# Patient Record
Sex: Male | Born: 1935
Health system: Southern US, Community
[De-identification: ages and names within clinical notes are randomized; demographics above are authoritative.]

## PROBLEM LIST (undated history)

## (undated) DIAGNOSIS — J189 Pneumonia, unspecified organism: Secondary | ICD-10-CM

## (undated) DIAGNOSIS — I2510 Atherosclerotic heart disease of native coronary artery without angina pectoris: Secondary | ICD-10-CM

## (undated) DIAGNOSIS — K5900 Constipation, unspecified: Secondary | ICD-10-CM

## (undated) DIAGNOSIS — I219 Acute myocardial infarction, unspecified: Secondary | ICD-10-CM

## (undated) DIAGNOSIS — N186 End stage renal disease: Secondary | ICD-10-CM

## (undated) DIAGNOSIS — I10 Essential (primary) hypertension: Secondary | ICD-10-CM

## (undated) DIAGNOSIS — I509 Heart failure, unspecified: Secondary | ICD-10-CM

## (undated) DIAGNOSIS — I209 Angina pectoris, unspecified: Secondary | ICD-10-CM

## (undated) DIAGNOSIS — M1990 Unspecified osteoarthritis, unspecified site: Secondary | ICD-10-CM

## (undated) HISTORY — DX: Heart failure, unspecified: I50.9

## (undated) HISTORY — PX: BACK SURGERY: SHX140

## (undated) HISTORY — PX: ARTERIOVENOUS GRAFT PLACEMENT: SUR1029

## (undated) HISTORY — DX: Acute myocardial infarction, unspecified: I21.9

## (undated) HISTORY — PX: LAPAROSCOPIC COLON RESECTION: SUR791

## (undated) HISTORY — PX: CORONARY ARTERY BYPASS GRAFT: SHX141

---

## 1999-06-02 ENCOUNTER — Encounter: Payer: Self-pay | Admitting: Thoracic Surgery (Cardiothoracic Vascular Surgery)

## 1999-06-06 ENCOUNTER — Inpatient Hospital Stay (HOSPITAL_COMMUNITY)
Admission: RE | Admit: 1999-06-06 | Discharge: 1999-06-14 | Payer: Self-pay | Admitting: Thoracic Surgery (Cardiothoracic Vascular Surgery)

## 1999-06-06 ENCOUNTER — Encounter: Payer: Self-pay | Admitting: Thoracic Surgery (Cardiothoracic Vascular Surgery)

## 1999-06-07 ENCOUNTER — Encounter: Payer: Self-pay | Admitting: Thoracic Surgery (Cardiothoracic Vascular Surgery)

## 1999-06-08 ENCOUNTER — Encounter: Payer: Self-pay | Admitting: Thoracic Surgery (Cardiothoracic Vascular Surgery)

## 1999-06-12 ENCOUNTER — Encounter: Payer: Self-pay | Admitting: Thoracic Surgery (Cardiothoracic Vascular Surgery)

## 2002-02-20 ENCOUNTER — Encounter: Payer: Self-pay | Admitting: Nephrology

## 2002-02-20 ENCOUNTER — Ambulatory Visit (HOSPITAL_COMMUNITY): Admission: RE | Admit: 2002-02-20 | Discharge: 2002-02-20 | Payer: Self-pay | Admitting: Nephrology

## 2007-08-15 ENCOUNTER — Ambulatory Visit: Payer: Self-pay | Admitting: Vascular Surgery

## 2007-08-21 ENCOUNTER — Ambulatory Visit (HOSPITAL_COMMUNITY): Admission: RE | Admit: 2007-08-21 | Discharge: 2007-08-21 | Payer: Self-pay | Admitting: Vascular Surgery

## 2007-08-21 ENCOUNTER — Ambulatory Visit: Payer: Self-pay | Admitting: Vascular Surgery

## 2007-11-25 ENCOUNTER — Inpatient Hospital Stay (HOSPITAL_COMMUNITY): Admission: AD | Admit: 2007-11-25 | Discharge: 2007-12-03 | Payer: Self-pay | Admitting: Internal Medicine

## 2008-09-24 ENCOUNTER — Ambulatory Visit: Payer: Self-pay | Admitting: Vascular Surgery

## 2008-09-24 ENCOUNTER — Ambulatory Visit (HOSPITAL_COMMUNITY): Admission: RE | Admit: 2008-09-24 | Discharge: 2008-09-24 | Payer: Self-pay | Admitting: Vascular Surgery

## 2008-12-04 ENCOUNTER — Ambulatory Visit: Payer: Self-pay | Admitting: Internal Medicine

## 2008-12-04 ENCOUNTER — Inpatient Hospital Stay (HOSPITAL_COMMUNITY): Admission: EM | Admit: 2008-12-04 | Discharge: 2008-12-07 | Payer: Self-pay | Admitting: Emergency Medicine

## 2010-08-27 LAB — CARDIAC PANEL(CRET KIN+CKTOT+MB+TROPI)
CK, MB: 1.5 ng/mL (ref 0.3–4.0)
Relative Index: 0.6 (ref 0.0–2.5)
Total CK: 121 U/L (ref 7–232)
Total CK: 173 U/L (ref 7–232)
Total CK: 176 U/L (ref 7–232)
Troponin I: 0.06 ng/mL (ref 0.00–0.06)

## 2010-08-27 LAB — URINE CULTURE: Colony Count: 2000

## 2010-08-27 LAB — BASIC METABOLIC PANEL
BUN: 40 mg/dL — ABNORMAL HIGH (ref 6–23)
CO2: 32 mEq/L (ref 19–32)
Calcium: 8.7 mg/dL (ref 8.4–10.5)
Chloride: 97 mEq/L (ref 96–112)
Chloride: 97 mEq/L (ref 96–112)
Creatinine, Ser: 6.54 mg/dL — ABNORMAL HIGH (ref 0.4–1.5)
GFR calc Af Amer: 11 mL/min — ABNORMAL LOW (ref >60)
GFR calc non Af Amer: 8 mL/min — ABNORMAL LOW (ref >60)
Sodium: 137 mEq/L (ref 135–145)

## 2010-08-27 LAB — GLUCOSE, CAPILLARY
Glucose-Capillary: 104 mg/dL — ABNORMAL HIGH (ref 70–99)
Glucose-Capillary: 114 mg/dL — ABNORMAL HIGH (ref 70–99)
Glucose-Capillary: 129 mg/dL — ABNORMAL HIGH (ref 70–99)
Glucose-Capillary: 139 mg/dL — ABNORMAL HIGH (ref 70–99)
Glucose-Capillary: 150 mg/dL — ABNORMAL HIGH (ref 70–99)
Glucose-Capillary: 162 mg/dL — ABNORMAL HIGH (ref 70–99)
Glucose-Capillary: 174 mg/dL — ABNORMAL HIGH (ref 70–99)
Glucose-Capillary: 88 mg/dL (ref 70–99)

## 2010-08-27 LAB — CBC
HCT: 34.2 % — ABNORMAL LOW (ref 39.0–52.0)
HCT: 35.9 % — ABNORMAL LOW (ref 39.0–52.0)
Hemoglobin: 10.8 g/dL — ABNORMAL LOW (ref 13.0–17.0)
Hemoglobin: 11.6 g/dL — ABNORMAL LOW (ref 13.0–17.0)
MCHC: 33.3 g/dL (ref 30.0–36.0)
MCHC: 34.1 g/dL (ref 30.0–36.0)
MCV: 98.1 fL (ref 78.0–100.0)
MCV: 99 fL (ref 78.0–100.0)
MCV: 99.4 fL (ref 78.0–100.0)
Platelets: 123 10*3/uL — ABNORMAL LOW (ref 150–400)
RBC: 3.18 MIL/uL — ABNORMAL LOW (ref 4.22–5.81)
RBC: 3.45 MIL/uL — ABNORMAL LOW (ref 4.22–5.81)
RBC: 3.63 MIL/uL — ABNORMAL LOW (ref 4.22–5.81)
RDW: 14.6 % (ref 11.5–15.5)
WBC: 12.2 10*3/uL — ABNORMAL HIGH (ref 4.0–10.5)
WBC: 13 10*3/uL — ABNORMAL HIGH (ref 4.0–10.5)
WBC: 9.1 10*3/uL (ref 4.0–10.5)

## 2010-08-27 LAB — ALBUMIN: Albumin: 2.5 g/dL — ABNORMAL LOW (ref 3.5–5.2)

## 2010-08-27 LAB — HEPATIC FUNCTION PANEL
Albumin: 3.1 g/dL — ABNORMAL LOW (ref 3.5–5.2)
Alkaline Phosphatase: 50 U/L (ref 39–117)
Bilirubin, Direct: <0.1 mg/dL (ref 0.0–0.3)
Total Bilirubin: 0.9 mg/dL (ref 0.3–1.2)

## 2010-08-27 LAB — URINALYSIS, ROUTINE W REFLEX MICROSCOPIC
Ketones, ur: 15 mg/dL — AB
Nitrite: NEGATIVE
Protein, ur: >300 mg/dL — AB
pH: 8.5 — ABNORMAL HIGH (ref 5.0–8.0)

## 2010-08-27 LAB — COMPREHENSIVE METABOLIC PANEL
AST: 45 U/L — ABNORMAL HIGH (ref 0–37)
BUN: 11 mg/dL (ref 6–23)
CO2: 31 mEq/L (ref 19–32)
Calcium: 8.9 mg/dL (ref 8.4–10.5)
Chloride: 95 mEq/L — ABNORMAL LOW (ref 96–112)
Creatinine, Ser: 3.73 mg/dL — ABNORMAL HIGH (ref 0.4–1.5)
GFR calc Af Amer: 20 mL/min — ABNORMAL LOW (ref >60)
GFR calc non Af Amer: 16 mL/min — ABNORMAL LOW (ref >60)
Glucose, Bld: 123 mg/dL — ABNORMAL HIGH (ref 70–99)
Total Bilirubin: 1.1 mg/dL (ref 0.3–1.2)

## 2010-08-27 LAB — LIPID PANEL: Cholesterol: 142 mg/dL (ref 0–200)

## 2010-08-27 LAB — RENAL FUNCTION PANEL
BUN: 17 mg/dL (ref 6–23)
CO2: 32 mEq/L (ref 19–32)
Chloride: 97 mEq/L (ref 96–112)
Glucose, Bld: 107 mg/dL — ABNORMAL HIGH (ref 70–99)
Phosphorus: 2.3 mg/dL (ref 2.3–4.6)
Potassium: 4.3 mEq/L (ref 3.5–5.1)
Sodium: 138 mEq/L (ref 135–145)

## 2010-08-27 LAB — TROPONIN I: Troponin I: 0.06 ng/mL (ref 0.00–0.06)

## 2010-08-27 LAB — CULTURE, BLOOD (SINGLE)

## 2010-08-27 LAB — PROTIME-INR
INR: 1.3 (ref 0.00–1.49)
Prothrombin Time: 16.3 seconds — ABNORMAL HIGH (ref 11.6–15.2)

## 2010-08-27 LAB — CULTURE, BLOOD (ROUTINE X 2)
Culture: NO GROWTH
Culture: NO GROWTH

## 2010-08-27 LAB — DIFFERENTIAL
Basophils Absolute: 0 10*3/uL (ref 0.0–0.1)
Eosinophils Relative: 0 % (ref 0–5)
Lymphocytes Relative: 7 % — ABNORMAL LOW (ref 12–46)
Lymphs Abs: 0.8 10*3/uL (ref 0.7–4.0)
Neutro Abs: 10.4 10*3/uL — ABNORMAL HIGH (ref 1.7–7.7)
Neutrophils Relative %: 85 % — ABNORMAL HIGH (ref 43–77)

## 2010-08-27 LAB — HEMOGLOBIN A1C

## 2010-08-27 LAB — RPR: RPR Ser Ql: NONREACTIVE

## 2010-08-27 LAB — CK TOTAL AND CKMB (NOT AT ARMC)
CK, MB: 1.1 ng/mL (ref 0.3–4.0)
Total CK: 116 U/L (ref 7–232)

## 2010-08-27 LAB — URINE MICROSCOPIC-ADD ON

## 2010-08-27 LAB — MRSA PCR SCREENING: MRSA by PCR: NEGATIVE

## 2010-08-27 LAB — MAGNESIUM: Magnesium: 2.1 mg/dL (ref 1.5–2.5)

## 2010-08-27 LAB — PHOSPHORUS: Phosphorus: 2.8 mg/dL (ref 2.3–4.6)

## 2010-08-29 LAB — POCT I-STAT 4, (NA,K, GLUC, HGB,HCT): HCT: 37 % — ABNORMAL LOW (ref 39.0–52.0)

## 2010-10-03 NOTE — H&P (Signed)
NAMEKENECHUKWU, ECKSTEIN                ACCOUNT NO.:  [id number]   MEDICAL RECORD NO.:  [id number]          PATIENT TYPE:  INP   LOCATION:  6730                         FACILITY:  [facility]   PHYSICIAN:  Elliot Cousin, M.D.    DATE OF BIRTH:  Jan 25, 1936   DATE OF ADMISSION:  11/25/2007  DATE OF DISCHARGE:                              HISTORY & PHYSICAL   PRIMARY CARE PHYSICIAN:  The patient is unassigned.   PRIMARY NEPHROLOGIST:  Garnetta Buddy, M.D.   CHIEF COMPLAINT:  The patient was transferred from [facility] for  further evaluation and management of acute on chronic renal failure.   HISTORY OF PRESENT ILLNESS:  The patient is a 75 year old man with a  past medical history significant for stage 4 chronic kidney disease,  coronary artery disease status, hypertension and type 2 diabetes  mellitus.  He was recently admitted to [facility] for further  evaluation of a colon mass apparently found via colonoscopy. The patient  underwent laparoscopic segmental colon resection of a portion of the  transverse colon with stapled anastomosis by Dr. Georgiana Shore on November 24, 2007.  Over the past 24 hours, the patient's urine output has decreased.  His creatinine has been ranging from 5 to 6 over the past couple of days  per the records from [facility].  Dr. Hyman Hopes, nephrologist,  evaluated the patient at [facility] earlier today.  In light of  the patient's diminishing urine output, he was transferred as a direct  admission to [facility].  Currently, the patient complains of  10/10 abdominal pain at and around the operative site.  He denies  diarrhea, nausea, painful urination, or vomiting.  He has not had a  bowel movement since the operation.  He has passed some flatulence.  He  has no complaints of chest pain although he does have some mild  shortness of breath. He denies cough, fever, or chills.   PAST MEDICAL HISTORY:  1. As above, status post laparoscopy,  mobilization of splenic flexure,      segmental colon resection of a portion of transverse colon with      stapled anastomosis per Dr. Georgiana Shore November 24, 2007 at [facility].  2. Stage 4 chronic kidney disease with a baseline creatinine at      approximately 6.0.  3. Coronary artery disease status post CABG in 2001.  4. Hypertension.  5. Hyperlipidemia.  6. Type 2 diabetes mellitus.  7. Degenerative joint disease.  8. Placement of new left forearm AV graft, April 2009, per Dr.      Edilia Bo.  9. Snuff use.   MEDICATIONS AT [facility]:  1. Lasix 160 mg IV every 8 hours.  2. IV fluids with D5W with 3 amps of bicarb at 75 cc an hour.  3. Albuterol nebulizer every 4 hours.  4. Allopurinol 100 mg daily, discontinued July 7.  5. Calcitriol 0.25 mcg daily, discontinued July 7.  6. Coreg 25 mg b.i.d., discontinued July 7.  7. Magnesium oxide 400 mg 2 tablets daily, discontinued July 7.  8. Lovenox 40 mg subcu nightly.  9. Norvasc 10 mg daily, discontinued July 7.   ALLERGIES:  No known drug allergies.   SOCIAL HISTORY:  The patient is married.  [place]  He has 6 children.  He is retired from the city of [address]  He dips snuff daily.  He denies smoking tobacco.  He denies alcohol and  illicit drug use.   FAMILY HISTORY:  Noncontributory.   REVIEW OF SYSTEMS:  As above in the history of present illness.   EXAMINATION:  Temperature 99.1, pulse 77, respiratory rate 20, blood  pressure 110/62.  Oxygen saturation 93% on 3 liters of nasal cannula  oxygen.  GENERAL:  The patient is a pleasant, alert, obese 75 year old [place]  [place] man who is currently lying in bed in some distress from  abdominal pain.  HEENT:  Head is normocephalic, nontraumatic.  Pupils equally round and  reactive to light.  Extraocular movements are intact.  Conjunctivae are  clear.  Sclerae are white.  Nasal mucosa is dry.  No sinus tenderness.  Oropharynx reveals  fair to poor dentition.  Mucous membranes are mildly  dry.  No posterior exudates, no erythema.  NECK:  Supple.  No adenopathy, no thyromegaly, no bruit, no JVD.  LUNGS:  Occasional bibasilar crackles and clear anteriorly.  Breathing  is nonlabored.  HEART:  S1, S2 with no murmurs, rubs or gallops.  ABDOMEN:  Staples in place vertically with an open wound caudally and a  small amount of serosanguinous drainage.  Abdomen is obese.  Hypoactive  bowel sounds, soft and moderately tender over the operative site; mild  distention  GU/RECTAL:  Deferred.  EXTREMITIES:  Pedal pulses barely palpable bilaterally.  No pretibial  edema, no pedal edema. Left arm AV graft noted.  NEUROLOGIC:  The patient is alert and oriented x3.  Cranial nerves II-  XII are intact.   ADMISSION LABORATORIES:  Currently pending.   ASSESSMENT:  1. Stage 4 chronic kidney disease with acute on chronic renal failure      as manifested by a reported decrease in urine output.  The patient      was evaluated by Dr. Hyman Hopes earlier today at [facility]. His      orders there are noted.  2. Hyperkalemia.  The patient's serum potassium was 5.6 at [facility] earlier today.  The hyperkalemia is the consequence of      chronic kidney disease.  3. Postoperative day #1, status post laparoscopic transverse colon      resection.  4. Coronary artery disease status post CABG in 2001.  5. Hypertension.  6. Type 2 diabetes mellitus.   PLAN:  1. The patient has been directly admitted.  2. We will consult Nephrology tonight.  3. Consider consulting General Surgery in the morning.  4. Wound Care consult.  5. We will continue IV fluids with D5W and 3 amps of bicarbonate.  6. We will continue IV Lasix 160 mg every 8 hours.  7. Pain management with morphine PCA, p.r.n. anti-emetics,      prophylactic Lovenox, and prophylactic Protonix.  8. N.p.o.  9. We will start sliding scale NovoLog and we will monitor the       patent's capillary blood glucose every 4 hours.  10.We will check STAT baseline laboratories tonight and a KUB.  We      will also order an urinalysis and culture.  Elliot Cousin, M.D.  Electronically Signed     DF/MEDQ  D:  11/25/2007  T:  11/25/2007  Job:  [id number]

## 2010-10-03 NOTE — Consult Note (Signed)
[name], [name]                ACCOUNT NO.:  [id number]   MEDICAL RECORD NO.:  [id number]          PATIENT TYPE:  INP   LOCATION:  6730                         FACILITY:  [facility]   PHYSICIAN:  Lennie Muckle, MD      DATE OF BIRTH:  1935-12-27   DATE OF CONSULTATION:  11/26/2007  DATE OF DISCHARGE:  11/26/2007                                 CONSULTATION   REQUESTING PHYSICIAN:  Marcelino Duster A. Ashley Royalty, MD.   Alysia Penna SURGEON AT [facility]:  Dr. Georgiana Shore.   NEPHROLOGISTS:  Garnetta Buddy, M.D. and Dyke Maes, M.D.   REASON FOR CONSULTATION:  Followup for postoperative exploratory  laparotomy at [facility]   HISTORY OF PRESENT ILLNESS:  This is a 75 year old black male who has  recently undergone an exploratory laparotomy with resection of colon  mass at [facility].  However, after surgery, the patient who  already has a history of a stage IV chronic kidney disease began to have  worsening renal function.  Apparently at that time, he became oliguric  and was at that time felt needed to be transferred to [facility] for  possible dialysis.  At the time of transfer, the patient's creatinine  was 6.0.  When the patient arrived here at [facility], he was  complaining of abdominal pain.  An acute abdominal series was done,  which revealed diffuse gaseous distention of the bowel, presumably a  postoperative ileus, otherwise everything was normal.  The patient  states today that his pain is better than the past few days.  Otherwise,  he is tolerating sips of clears currently with no nausea and vomiting  and is currently passing flatus.  However, the patient has not had a  bowel movement yet.  At this time, we were asked to see the patient to  follow and monitor for postoperative issues.   REVIEW OF SYSTEMS:  Please see HPI.  Otherwise, the patient does not  complain of chest pain, shortness of breath.  He does have some normal  postoperative abdominal pain.   Otherwise, all other systems are  negative.   FAMILY HISTORY:  Noncontributory.   PAST MEDICAL HISTORY:  1. Stage IV chronic kidney disease.  2. Coronary artery disease.  3. Hypertension.  4. Dyslipidemia.  5. Adult-onset diabetes mellitus.  6. Degenerative joint disease.   PAST SURGICAL HISTORY:  1. CABG in 2001.  2. Left forearm AV graft, which appears to have been put in April      2009.  3. Recent exploratory laparotomy with segmental transverse colon      resection with primary anastomosis by Dr. Georgiana Shore at [facility] on November 24, 2007.   SOCIAL HISTORY:  The patient is married and has a daughter that is  present in the room.  He denies alcohol or drugs.  However, he admits to  using tobacco as a dip.   ALLERGIES:  NKDA.   MEDICATIONS AT [facility]:  1. Lasix 160 mg IV q.8 h.  2. IV fluids with D5W with 3 ampules of bicarb  at 75 mL an hour.  3. Albuterol nebulizer every 4 hours.  4. Allopurinol 100 mg daily, it was discontinued on November 25, 2007.  5. Lovenox 40 mg subcu nightly.   PHYSICAL EXAMINATION:  GENERAL:  This is a very pleasant, but somewhat  lethargic 75 year old black male, who is sitting up in his chair and  currently in no acute distress.  VITAL SIGNS:  Temperature max is 100, temperature currently is 98  degrees.  Blood pressure is 139/71, pulse is 89, and respirations are  20.  EYES:  Sclerae are somewhat injected and somewhat icteric.  Otherwise,  pupils are equal, round and reactive to light.  EARS, NOSE, MOUTH AND THROAT:  Ears and nose with no obvious masses or  lesions and no rhinorrhea.  Mouth is pink and moist.  Throat shows no  exudate.  NECK:  Trachea is midline.  No thyromegaly and the neck is supple.  HEART:  Regular rate and rhythm.  Normal S1 and S2, no murmurs, gallops,  or rubs are noted.  2+ carotid, pedal, and radial pulses bilaterally.  LUNGS:  Clear to auscultation bilaterally with no wheezes, rhonchi, or  rales  noted.  RESPIRATORY:  Effort is nonlabored.  CHEST:  Symmetrical.  ABDOMEN:  Soft, obese, some mild tenderness on exam, but appropriate  postoperative tenderness, otherwise hypoactive bowel sounds.  There is  no tympani, guarding, or rebounding present.  The patient does have a  midline incision, which is clean with wound edges well approximated and  staples intact.  There is a small area around the umbilicus that is  oozing some blood.  Pressure was held to this area and this was  controlled.  At the time of my exam, this area of bleeding had ceased  and a bandage was placed over it in case of a re-bleed.  MUSCULOSKELETAL:  All 4 extremities were symmetrical with no cyanosis,  clubbing, or edema.  There is a AV graft noted in the patient's left  forearm.  SKIN:  There are no obvious masses, lesions, or rashes.  NEURO:  Cranial nerves II through XII appear to be grossly intact.  Deep  tendon reflex exam is deferred at this time.  PSYCH:  The patient is alert and oriented x3 with a somewhat blunted  affect.   LABS AND DIAGNOSTICS:  White count is 7200, hemoglobin is 9.1,  hematocrit is 26.4, platelets are 140,000.  Sodium 137, potassium 4.8,  glucose 130.  BUN 59 and creatinine 7.44.  LFTs were all normal.   DIAGNOSTICS:  An abdominal x-ray done today on November 26, 2007, shows a  possible postoperative ileus.   IMPRESSION:  1. Stage IV chronic kidney disease.  2. Acute on chronic renal failure.  3. Status post exploratory laparotomy with a partial colon resection      secondary to a colonic mass located in the transverse colon.  4. Hypertension.  5. Dyslipidemia.  6. Diabetes.   PLAN:  At this time, the patient appears to have a normal postoperative  course.  The patient at this time is tolerating sips of clear liquids  and passing flatus.  At this time, we would recommend that the patient  be advanced to a clear liquid diet and then his diet advance as  tolerated.  Otherwise, if  the patient begins to complain of constipation  or any other issues relating to that, then we would recommend stool  softeners to be used to help with constipation.  Otherwise, we  would  recommend the patient mobilize and get out of bed to his chair and  ambulate in the halls at least 3 times a day.  Therefore, we agree with  the consultation for physical therapy and occupational therapy.  Otherwise, the patient, as stated earlier, seems to be having an  appropriate postoperative course, and at this time this patient has been  discussed with Dr. Freida Busman, and she has evaluated the patient as well.   Thank you for this consultation and please feel free to call if there  are any further questions concerning this patient.      Letha Cape, PA      Lennie Muckle, MD  Electronically Signed    KEO/MEDQ  D:  11/26/2007  T:  11/27/2007  Job:  [id number]   cc:   Dr. Gaspar Cola, MD  Dyke Maes, M.D.  Garnetta Buddy, M.D.

## 2010-10-03 NOTE — Discharge Summary (Signed)
[name], [name]                ACCOUNT NO.:  [id number]   MEDICAL RECORD NO.:  [id number]          PATIENT TYPE:  INP   LOCATION:  6730                         FACILITY:  [facility]   PHYSICIAN:  Altha Harm, MDDATE OF BIRTH:  10-06-1935   DATE OF ADMISSION:  11/25/2007  DATE OF DISCHARGE:                               DISCHARGE SUMMARY   DATE OF DISCHARGE:  Not yet determined. Date of interim discharge  summary December 02, 2007.   DISCHARGE DISPOSITION:  To home to have dialysis three times a week to  be determined by the nephrology service where and when.   FINAL DISCHARGE DIAGNOSES:  1. New end-stage renal disease.  2. Status post partial transverse colectomy.  3. Ileus, resolved.  4. Diabetes type 2, controlled.  5. Chronic anemia.  6. Hypertension, well controlled.  7. Coronary artery disease.  8. Hyperlipidemia.  9. Secondary hyperparathyroidism.  10.Degenerative joint disease.   DISCHARGE MEDICATIONS:  To be determined at the time of discharge.   CONSULTANTS:  1. Dr. Layla Barter, Nephrology.  2. [facility].   PROCEDURE:  Initiation of dialysis.   DIAGNOSTIC STUDIES:  1. Acute abdominal series was performed on admission on July 7, which      showed diffuse gangrenous distention of the bowel presumed to      postoperative ileus.  2. Cardiomegaly with vascular congestion.  No low volumes and      bibasilar atelectasis.  3. One view of the abdomen done subsequent which shows persistent      patent of gas throughout the intestine consistent with      postoperative ileus.  4. Chest x-ray two-view, done on July 10, which showed near expiratory      examination with basilar atelectasis.  5. Acute abdominal series done on the 10th, which shows finding      consistent with generalized adynamic ileus.  Degree of distention      is increased.  Increase in distention of the stomach which showed      marked increase.   PRIMARY CARE PHYSICIAN:   Unassigned.   NEPHROLOGIST:  Dr. Elvis Coil and Dr. Primitivo Gauze.   SURGEON:  At [facility]. [place].   CODE STATUS:  Full code.   CHIEF COMPLAINT:  Patient transferred from [facility] for further  evaluation and management of acute on chronic renal failure.   HISTORY OF PRESENT ILLNESS:  Please see the H&P dictated by Dr. Sherrie Mustache  for details of the HPI.   HOSPITAL COURSE:  1. End-stage renal disease.  While at [facility], the patient      was found to have an escalation of his creatinine up to  7.44.  Dr.      Hyman Hopes saw the patient at [facility] and recommended the      patient be transferred to [facility] as he felt that he      would imminently require dialysis.  The patient was thus      transferred to the service.  Within 48 hours of coming to this      hospital,  the patient did indeed require dialysis and dialysis was      initiated.  The patient had already had a graft placed previously      in anticipation of initiating dialysis.  The patient has tolerated      dialysis well and currently he is awaiting and in the process of      clipping to assign him to dialysis unit.  2. Status post transverse colonic resection secondary to colonic mass.      The patient had a transverse colonic resection done at [facility] by Dr. Eino Farber.  The patient upon transfer over here      appeared to have somewhat of an ileus.  Surgery was consulted who      assisted in management of the patient's postoperative care.  The      patient did develop an adynamic ileus and was given bowel rest.      When the patient starts to have passage of flatus, he was started      on sips and chips and his diet advanced accordingly to where he is      tolerating a renal diet without any difficulty now.  The patient      has had several bowel movements.  His abdomen is soft, nontender      with good bowel sounds.  It appears to now be following an       uncomplicated postoperative course.  3. Diabetes type 2.  The patient's blood sugars were well controlled      while hospitalized.  4. Anemia.  The patient has anemia of chronic disease associate with      his renal failure.  His anemia is being managed by the nephrologist      with [facility].  5. Coronary artery disease, stable.  6. Hypertension, was well controlled during his hospitalization.  7. Hyperlipidemia noted.  8. Degenerative joint disease noted.  9. Debility.  The patient developed some generalized weakness after      his surgery and physical therapy was consulted who started working      with the patient. The patient has been progressing nicely in      physical therapy.  However, would require further therapies upon      discharge from the hospital.  This brings the patient's hospital      course up to date of to December 02, 2007.      Altha Harm, MD  Electronically Signed     MAM/MEDQ  D:  12/02/2007  T:  12/02/2007  Job:  [id number]

## 2010-10-03 NOTE — Assessment & Plan Note (Signed)
OFFICE VISIT   KINGSTON, SHAWGO  DOB:  Oct 14, 1935                                       08/15/2007  ZOXWR#:[id number]   The patient presents today for evaluation of AV access.  This is a very  pleasant 75 year old gentleman with a long history of chronic renal  insufficiency.  Apparently, he had diagnosis of a colon malignancy, and  was opted not to have surgery since the thought of his renal failure  would be quite progressive, and had opted not to have dialysis.  He now  wishes to undergo colon resection and has been told that he needs to  have initiation of dialysis prior to this.  He has seen Dr. Cari Caraway in our office 2 years ago.  At that time, he had a vein map that  showed no ability for fistula creation.  Had been recommended to place a  left forearm loop graft in his nondominant arm.  His medical history is  otherwise unchanged.  He does have hypertension, advanced renal  insufficiency.  Last creatinine that I have is 5.6 from January of 2009.   PHYSICAL EXAM:  This is a well-developed, well-nourished black male  appearing stated age of 58.  Blood pressure is 140/74, pulse 68,  respirations 18.  He has 2+ radial pulses bilaterally.  He is a large  man and has poor surface veins.  I have discussed options with the  patient and his daughter present.  I have recommended placement of a  left forearm loop graft.  I explained alternatives with Diatek catheter  and also AV fistula, for which he is not a candidate.  We will proceed  with this at his earliest convenience on 04/02 at [facility].   Larina Earthly, M.D.  Electronically Signed   TFE/MEDQ  D:  08/15/2007  T:  08/15/2007  Job:  [id number]   cc:   Wilber Bihari. Caryn Section, M.D.

## 2010-10-03 NOTE — Op Note (Signed)
NAMEYASSIR, ENIS                ACCOUNT NO.:  [id number]   MEDICAL RECORD NO.:  [id number]          PATIENT TYPE:  AMB   LOCATION:  SDS                          FACILITY:  [facility]   PHYSICIAN:  Di Kindle. Edilia Bo, M.D.DATE OF BIRTH:  19-Feb-1936   DATE OF PROCEDURE:  08/21/2007  DATE OF DISCHARGE:                               OPERATIVE REPORT   PREOPERATIVE DIAGNOSIS:  Chronic renal failure.   POSTOPERATIVE DIAGNOSIS:  Chronic renal failure.   PROCEDURE:  Placement of new left forearm AV graft.   SURGEON:  Dr. Edilia Bo   ASSISTANT:  Delight Hoh, R.N.F.A.   ANESTHESIA:  Local with sedation.   TECHNIQUE:  The patient was taken to the operating room, sedated by  anesthesia.  The left upper extremity was prepped and draped in the  usual sterile fashion.  After the skin was infiltrated with 1%  lidocaine, a longitudinal incision was made above the antecubital level,  and there was an artery which was dissected free which was fairly small.  Upon interrogation, this was a superficial artery, and deeper down,  there was a much larger artery which was the brachial artery.  This was  controlled with a vessel loop.  It had a good pulse.  The adjacent  brachial vein was reasonable sized.  A 4- to 7-mm graft was then  tunneled in a loop fashion in the forearm with the arterial aspect of  the graft along the radial aspect of the forearm.  The patient was then  heparinized.  The brachial artery was clamped proximally and distally,  and a longitudinal arteriotomy was made.  A segment of the 4-mm of the  graft was excised.  The graft was slightly spatulated and sewn end-to-  side to the artery using continuous 6-0 Prolene suture.  Next, the graft  was pulled to the appropriate length for anastomosis to the brachial  vein.  The vein was ligated distally and spatulated proximally.  The  graft was cut to appropriate length, spatulated and sewn end-to-end to  the vein using continuous 6-0  Prolene suture.  At completion, there was  excellent thrill in the graft and a palpable radial pulse.  Hemostasis  was obtained in the wound.  The wound was closed with a deep layer of 3-  0 Vicryl, and the skin closed with 4-0 Vicryl.  Sterile dressing was  applied.  The patient tolerated the procedure well and was transferred  to recovery room in satisfactory condition.  All needle and sponge  counts were correct.      Di Kindle. Edilia Bo, M.D.  Electronically Signed     CSD/MEDQ  D:  08/21/2007  T:  08/21/2007  Job:  [id number]

## 2010-10-03 NOTE — Op Note (Signed)
[name], [name]                ACCOUNT NO.:  [id number]   MEDICAL RECORD NO.:  [id number]          PATIENT TYPE:  AMB   LOCATION:  SDS                          FACILITY:  [facility]   PHYSICIAN:  Di Kindle. Edilia Bo, M.D.DATE OF BIRTH:  1936/02/27   DATE OF PROCEDURE:  09/24/2008  DATE OF DISCHARGE:  09/24/2008                               OPERATIVE REPORT   PREOPERATIVE DIAGNOSIS:  Chronic kidney disease with clotted left  forearm arteriovenous graft.   POSTOPERATIVE DIAGNOSIS:  Chronic kidney disease with clotted left  forearm arteriovenous graft.   PROCEDURE:  Thrombectomy of left forearm AV graft with insertion of new  segment graft to the adjacent brachial vein.   SURGEON:  Di Kindle. Edilia Bo, MD   ASSISTANT:  Nurse.   ANESTHESIA:  Local with sedation.   TECHNIQUE:  The patient was taken to the operating room sedated by  Anesthesia.  The left upper extremity was prepped and draped in usual  sterile fashion.  After the skin was infiltrated with 1% lidocaine,  incision at the antecubital level was opened, and the venous anastomosis  was dissected free.  I dissected higher up on the brachial vein, which  was quite sclerotic with significant intimal thickening.  However, an  adjacent vein which was deeper was identified and this appeared to be  patent and free of disease.  I excised the old venous anastomosis and  extended the venotomy up the old vein, which was sclerotic over a long  length.  I therefore, elected to jump to a different vein.  The patient  was heparinized.  Graft thrombectomy was achieved using a #4 Fogarty  catheter.  The arterial plug was clearly retrieved and excellent flow  established to the graft, which was flushed with heparinized saline and  clamp.  A new segment of 6-mm PTFE was brought to the field, spatulated,  and sewn end-to-end to the vein, which I had selected.  This vein had  been ligated and then spatulated proximally.  It had taken a  5-mm  dilator.  The new segment of graft was then cut to the appropriate  length and anastomosed end-to-end to the old graft using continuous 6-0  Prolene suture.  At the completion, there was an excellent thrill in the  graft.  Hemostasis was obtained in the wound.  There was a palpable  radial pulse.  The wound was closed with deep layer of 3-0 Vicryl, and  the skin closed with 4-0 Vicryl.  A sterile dressing was applied.  The  patient tolerated the procedure well, was transferred to the recovery  room in satisfactory condition.  All needle and sponge counts were  correct.      Di Kindle. Edilia Bo, M.D.  Electronically Signed     CSD/MEDQ  D:  09/24/2008  T:  09/25/2008  Job:  [id number]

## 2010-10-03 NOTE — Discharge Summary (Signed)
[name], [name]                ACCOUNT NO.:  [id number]   MEDICAL RECORD NO.:  [id number]          PATIENT TYPE:  INP   LOCATION:  6730                         FACILITY:  [facility]   PHYSICIAN:  Marcellus Scott, MD     DATE OF BIRTH:  Jun 20, 1935   DATE OF ADMISSION:  11/25/2007  DATE OF DISCHARGE:  12/03/2007                               DISCHARGE SUMMARY   This is an addendum to the interim Discharge Summary that was done by  Dr. Marthann Schiller on the 14th of July.   PRIMARY MEDICAL DOCTOR:  Unassigned.   PRIMARY NEPHROLOGIST:  Garnetta Buddy, M.D.   SURGEON:  Dr. Eino Farber at [facility]   Please refer to the Discharge Summary by Dr. Ashley Royalty for Final  Discharge Diagnoses, Consultations, Procedures and Hospital Course until  the 14th of July.   DISCHARGE MEDICATIONS:  1. Thiamine 100 mg p.o. daily.  2. Folate 1 mg p.o. daily.  3. Prilosec OTC 20 mg p.o. daily.  4. Colace 100 mg p.o. b.i.d.  5. Nephrovite 1 p.o. daily.  6. PhosLo 667 mg p.o. t.i.d.  7. Nicotine patch 21 mg/ 24 hours, 1 daily for 5 weeks, then taper.  8. Beneprotein 1 scoop by mouth 3 times a day.  9. Allopurinol 100 mg p.o. daily.  10.Calcitriol 0.25 mg p.o. daily.   DISCONTINUED MEDICATIONS:  Coreg, Lasix, magnesium oxide and Norvasc.   HOSPITAL COURSE:  Since yesterday, the patient has continued to remain  stable and is essentially with no complaints, stable vital signs and  physical examination.  Renal physicians have made arrangements for the  patient to be dialyzed at [facility] as an outpatient on Tuesdays,  Thursdays and Saturdays to begin on the 16th of July.  At this point,  the patient is stable for discharge and is to followup with his  dialysis, surgeon at [facility] regarding the pathology report  and further management.  The patient will also be provided with home  health, physical therapy, occupational therapy and durable medical  equipment.   Today's lab with sodium 137,  potassium 3.7, chloride 100, bicarb 25, BUN  62, creatinine 7.07, glucose 121, calcium 8.9.  CBC with hemoglobin of  10.1, hematocrit 30.2, white blood cells 8.1, platelets 219.      Marcellus Scott, MD  Electronically Signed     AH/MEDQ  D:  12/03/2007  T:  12/03/2007  Job:  [id number]   cc:   Garnetta Buddy, M.D.

## 2010-10-06 NOTE — Discharge Summary (Signed)
[name], [name]                ACCOUNT NO.:  [id number]   MEDICAL RECORD NO.:  [id number]          PATIENT TYPE:  INP   LOCATION:  2012                         FACILITY:  [facility]   PHYSICIAN:  Alvester Morin, M.D.  DATE OF BIRTH:  August 07, 1935   DATE OF ADMISSION:  12/04/2008  DATE OF DISCHARGE:  12/07/2008                               DISCHARGE SUMMARY   DISCHARGE DIAGNOSES:  1. Pyelonephritis.  2. End-stage renal disease, on hemodialysis, Tuesday, Thursday, and      Saturday.  3. Coronary artery disease.  4. Diabetes.  5. Hypertension.   DISCHARGE MEDICATIONS:  1. Aspirin 81 mg by mouth once daily.  2. Benzonate 100 mg 3 times a day as needed for cough.  3. Klonopin 0.5 mg twice a day as needed.  4. Folate 1 mg by mouth once daily.  5. Meclizine 25 mg twice a day as needed.  6. MiraLax 17 grams per day.  7. PhosLo 667 mg 1 tablet 3 times a day with meals.  8. Rena-Vite 1 tablet by mouth once daily.  9. Cilostazol 100 mg by mouth twice a day.  10.Cipro 500 mg by mouth daily for 7 days.  11.Vitamin B12 100 mg by mouth once daily.   DISPOSITION AND FOLLOWUP:  The patient is to follow up with Dr. Harrison Mons in  [place] in 1-2 weeks.  At that time, please reevaluate a urinalysis and  UA to evaluate for cleared infection.  Also, the patient is to follow up  with [facility], his primary nephrologist at that  practice is either Dr. Darrick Penna or Dr. Hyman Hopes. He will call for an  appointment with them.   PROCEDURES PERFORMED:  CT of the abdomen and pelvis without contrast.  Impression as follows:  1. Atherosclerosis.  2. Chronic calcific pancreatitis.  3. Medical renal diseases, bilateral perinephric stranding.  4. A 2-cm left inferior pole renal cystic lesion likely simple cyst      and incompletely characterized by noncontrast technique.   Also for the CT of the pelvis impression as follows:  1. Prostatomegaly.  2. No acute abnormality in the anatomic pelvis.  3. Large fecal burden.   CONSULTATIONS:  Nephrology.  Nephrology followed for hemodialysis during  the patient's admission.   ADMITTING HISTORY AND PHYSICAL:  The patient is a 75 year old male with  a past medical history of end-stage renal disease, on hemodialysis,  Tuesday, Thursday, and Saturday, status post CABG x6 vessels in 2001,  and status post colonic resection of a portion of transverse colon who  presents to the emergency department after hemodialysis treatment where  he had had fever and chills and some confusion/altered mental status.  The patient's family reports the patient was having some chills and  shaking the night prior to admission.  He also had some mild confusion.  The night prior to admission per the family, the patient was reported to  have urinary incontinence x1 today which is not the usual for him.  The  patient may shower and usually uses bathroom to urinate 3 times daily.  The patient reports some  burning with urination that began 1 day prior  to admission.  Does admit to change in color from clear/yellow to [place].  He and his wife also noted a change in the odor of his urine given his  incontinence today.  The patient's wife also reports a cough x1 week.  It is worse at night.  It is dry and nonproductive.  The patient reports  increased sneezing as well.  The patient denies gastrointestinal  complaints such as nausea, vomiting, or diarrhea.  The patient denies  chest pain or shortness of breath.  The patient does affirm generalized  weakness.  He uses a cane secondary to fear of falling due to this  weakness.  The patient's wife claims the weakness is worse today and has  required additional help as a result.   PHYSICAL EXAMINATION:  VITAL SIGNS:  Temperature 101.3, heart rate 97,  blood pressure 89/55, respiratory rate of 16, and oxygen saturation is  95% on room air.  The patient's weight was 108.2 kg with a 117.5 kg dry  weight.  GENERAL:  No acute  distress, lying in bed with family in the room.  HEAD, EYES, EARS, NOSE, AND THROAT:  Dry mucous membranes.  Extraocular  movements intact with no scleral icterus noted.  CARDIOVASCULAR:  Regular rate and rhythm with no murmurs, rubs, or  gallops.  PULMONARY:  Clear to auscultation bilaterally.  GI: Positive bowel sounds, soft, nontender, and nondistended.  Midline  scar from hemicolectomy.  EXTREMITIES:  Left forearm AV graft appears clean and without erythema,  warmth, or purulence.  There is a positive thrill and bruit.  NEUROLOGIC:  Alert and oriented x3.  Cranial nerves II-XII intact and  5/5 strength throughout all extremities.  Sensation intact to light  touch.  PSYCHIATRIC:  Appropriate.  Good eye contact.  Recent/remote memory  intact.   ADMISSION LABORATORY DATA:  Urinalysis:  PH of 8.5 with small blood, 15  ketones greater than 300 protein, negative for nitrites, positive for  leukocyte esterase, large leuks, too numerous to count white blood  cells, and many bacteria.  Coags:  PT of 16.3 and INR 1.3.  Blood  cultures and urine cultures were drawn.  Chest x-ray showed no acute  findings.  CBC:  White blood cell count of 12.2, hemoglobin of 12.0, and  platelets of 154 with an absolute neutrophil count of 10.4 and MCV of  99.  Comprehensive metabolic panel:  Sodium 134, potassium 4.1, chloride  95, bicarb 31, BUN 11, creatinine 3.73, and glucose of 123.  Total bili  1.1, protein 6.7, albumin 3.5, calcium 8.9, alk phos 51, AST of 45, and  ALT of 23.   HOSPITAL COURSE:  1. Pyelonephritis.  The patient was admitted with a temperature      originally in the ED of greater than 101 degrees.  He had also had      a temperature prior to admission at the [facility] who      sent him to [facility] following administration of vancomycin and      ceftazidime after drawing two blood cultures.  The patient was      continued on vancomycin and ceftazidime.  Continued to improve       throughout the first day of admission.  His fever decreased to      100.2.  Orthostatics were checked which appeared to be normal and      blood cultures and urine cultures were still pending.  The  night      following the day after admission, the patient had spiked a fever      to 102.9.  The patient was seen and blood cultures were drawn and      ceftazidime was changed to Zosyn for better Gram-negative coverage      and CT scan was considered in order to rule out perinephric abscess      if the patient continued to be febrile.  Later in the evening, the      physicians were once again called for the patient having continued      fever and episodes of hypotension with the lowest blood pressure      being 98/60.  Considering the patient was already on broad-spectrum      antibiotics and had end-stage renal disease, a CT without contrast      of the abdomen and pelvis was ordered, which showed perinephric      stranding.  The patient was also fluid resuscitated with      appropriate response in blood pressure which resulted in a blood      pressure of 120s/50s.  The patient was transferred to the ICU for      closer monitoring where he continued to do well and became      afebrile.  Lactic acid was also checked at the time and it was 1.0.      The patient was stable with mean arterial pressures greater than 65      throughout his stay in the ICU and was eventually transferred to a      telemetry bed where his fevers continued to improve and the next      day his max temp was 98.2.  Blood cultures that were drawn from the      emergency department came back with no growth to date as well as      the blood cultures from the [facility] that were drawn      the day of admission prior to administration of antibiotics.      Considering the patient's improvement, the patient was discharged      on ciprofloxacin 500 mg once daily for 7 days and was told to      followup with his  primary care physician for repeat urinalysis and      urine cultures to verify clearance of infection and to follow up      with [facility] for his end-stage renal disease.  2. End-stage renal disease, on hemodialysis, Tuesday, Thursday, and      Saturday.  The patient was managed throughout the hospitalization      and received hemodialysis on Tuesday, the day of his discharge.  He      is to continue hemodialysis as an outpatient in [place].  The      patient was stable throughout the admission in this respect.  3. Coronary artery disease status post CABG x6 vessels in 2001.  This      was stable throughout the hospitalization.  The patient was      continued on his aspirin and statins were contraindicated due to      his allergy/intolerance.  4. Diabetes mellitus.  No issues during hospitalization.  5. Hypertension.  There were no issues with hypertension throughout      the hospitalization, although the patient did have some reported      hypotension throughout the hospitalization with the lowest blood  pressure of 98/60 for which the patient was transferred to ICU to      be monitored more closely.  The patient did well following a small      amount of fluid resuscitation and did not have any issues      throughout the remainder of his hospitalization.   DISCHARGE VITALS SIGNS:  Temperature 98.2, heart rate 76, respirations  18, blood pressure 137/65 with oxygen saturation of 100% on room air.   DISCHARGE LABORATORY DATA:  Blood cultures, no growth x5 days.  Phosphorus 2.7 and albumin 2.5.  Basic metabolic panel with a sodium of  133, potassium of 3.9, chloride of 97, bicarb of 28, BUN of 40,  creatinine of 6.54, glucose of 110, and a calcium of 8.7.  CBC:  White  blood cell count of 9.1, hemoglobin of 10.4, hematocrit of 30.3, and  platelets of 123 with an MCV of 98.1.      Ian Riggs, MD  Electronically Signed      Alvester Morin, M.D.   Electronically Signed    ME/MEDQ  D:  12/24/2008  T:  12/24/2008  Job:  [id number]   cc:   Eileen Stanford, M.D.

## 2010-10-06 NOTE — Op Note (Signed)
[place]. [facility]  Patient:    [name]                          MRN: [id number] Proc. Date: 06/06/99 Adm. Date:  [id number] Attending:  Charlett Lango Dictator:   Salvatore Decent. Dorris Fetch, M.D. CC:         Earl Many, M.D.             Nadine Counts, M.D.                           Operative Report  PREOPERATIVE DIAGNOSIS:  Three-vessel coronary artery disease.  POSTOPERATIVE DIAGNOSIS:  Three-vessel coronary artery disease.  PROCEDURES: 1. Median sternotomy. 2. Extracorporeal circulation. 3. Coronary artery bypass grafting x 6 (LIMA to LAD, saphenous vein graft to first    diagonal, saphenous vein graft to posterior descending, sequential saphenous  vein graft to ramus intermedius, obtuse marginal #2, and posterolateral).  SURGEON:  Dr. Charlett Lango.  ASSISTANT:  Dr. Loura Pardon.  ANESTHESIA:  General.  FINDINGS:  Three-vessel coronary disease, marked left ventricular hypertrophy, ood target vessels except OM2 was small and fair quality, and posterior descending as fair quality.  Good quality conduits.  Large trifurcating ramus intermedius branch which was not seen on catheterization.  Large posterolateral branch not seen on  catheterization.  CLINICAL NOTE:  [name] is a 75 year old gentleman with diabetes, hypertension, hypercholesterolemia, and mild chronic renal insufficiency.  He had a non-Q-wave myocardial infarction near the end of 2000.  The patient subsequently had unstable angina pattern.  He underwent cardiac catheterization which revealed three-vessel coronary artery disease, and was referred for coronary artery bypass grafting. The indications, risks, and benefits of coronary artery bypass grafting, as well as  alternative treatments, were discussed with the patient and are outlined in the  patients office note.  The patient understood the alternatives, indications, risks, and benefits, and agreed to  proceed.  DESCRIPTION OF PROCEDURE:  [name] was brought to the preoperative holding area on 06/06/99.  Lines were placed to monitor arterial central venous and pulmonary  arterial pressure.  EKG leads were placed for continuous telemetry.  The patient was taken to the operating room, anesthetized, and intubated.  A Foley catheter was placed.  Intravenous antibiotics were administered.  The chest, abdomen, and legs were prepped and draped in the usual fashion.  A median sternotomy was performed, and the left internal mammary artery was harvested in the standard fashion.  Simultaneously, an incision was made in the  medial aspect of the right leg, and the greater saphenous vein was harvested from the ankle to the mid-thigh.  Greater saphenous vein was a good quality conduit, as was the mammary artery.  The patient was fully heparinized prior to dividing the distal end of the mammary artery.  When divided, there was excellent flow through the vessel.  A Bulldog clamp was placed across the distal end, all branches were clipped.  The mammary artery was placed in a ______ soaked sponge and placed into the left pleural space.  The pericardium was opened.  The ascending aorta was inspected.  There was no palpable atherosclerotic disease.  The aorta was cannulated via concentric two Ethibond pledget pursestring sutures.  A dual stage venous cam was placed via pursestring suture in the right atrial appendage.  Cardiopulmonary bypass was instituted and the patient was cooled to 32 degrees Celsius.  The  coronary arteries were inspected, and anastomotic sites were chosen.  The conduits were chosen and cut to length.  A foam pad was placed in the pericardium to protect to left phrenic nerve.  A temperature probe was placed in the myocardial septum.  A cardioplegic cam was placed in the ascending aorta.  Of note, the left anterior descending artery was a large good quality target.  The  first diagonal was seen coming off the left anterior descending artery and there was significant atherosclerotic plaque at the takeoff of the first diagonal.  There was a large ramus branch which was not seen on the catheterization which had significant disease total proximally, it ad a short vessel, and then trifurcated into three branches.  The first obtuse marginal was a large vessel with no significant atherosclerotic disease by catheterization, and no palpable disease intraoperative.  The second obtuse marginal was a small, but graftable branch, it was actually a vessel which bifurcated in the second branch of the obtuse marginal that was the one grafted. There also was a large posterolateral branch which probably came off the left circumflex coronary artery, although it cannot be determined for certain, which  also was graftable, but had not been seen on catheterization due to severe proximal disease.  The aorta was cross clamped, the left ventricle was emptied via the aortic ______, cardiac arresting was achieved with a combination of cold antegrade cardioplegia and topical ice saline.  After achieving a complete diastolic arrest, the myocardium was 12 degrees Celsius, the following distal anastomoses were performed.  First, a reverse saphenous vein graft was placed end-to-side to the posterior descending coronary.  The posterior descending was a branch of the right coronary. It was 1.5 mm in diameter, was fair quality vessel.  There was significant disease proximally, and additional disease distally.  The vessel was grafted in its mid-portion.  The anastomosis was performed with a running 7-0 Prolene suture.  There was good flow through the graft.  At the completion of the anastomosis additional cardioplegia was administered through the vein graft.  Next, a reverse saphenous vein graft was placed sequentially to the ramus, second obtuse marginal, and posterolateral  branches.  A side-to-side anastomosis was performed to the ramus intermedius.  The ramus intermedius was a 2 mm good quality  coronary.  The anastomosis was performed with a running 7-0 Prolene suture. The second side-to-side anastomosis was to the second obtuse marginal branch of the  circumflex.  It was a 1.3 mm fair quality vessel, it was very thinned walled and very small.  This anastomosis also was performed with a running 7-0 Prolene suture. It was probed proximally and distally to ensure patency.  Finally, an end-to-side anastomosis was performed to the posterolateral branch.  It was a 1.5 mm vessel  that was free of atherosclerosis at the site of the anastomosis and distally, it was totally occluded proximally.  Again, the anastomosis was performed with a running 7-0 Prolene suture.  After completion of this anastomosis additional cardioplegia was administered down the vein graft, and all distal anastomoses had good hemostasis.  A reverse saphenous vein graft was then placed end-to-side to the first diagonal branch of the left anterior descending artery.  This vessel was 1.5 mm, good quality coronary.  The anastomosis was performed with a running 7-0 Prolene suture. The vein graft was of good quality.  Again, there was good flow through the vessel.  Finally, the left internal mammary artery was brought through a window on  the pericardium anterior to the phrenic nerve.  The distal end was spatulated, and anastomosed end-to-side to the left anterior descending artery.  The mammary artery was 2 mm good quality conduit with excellent flow.  The left anterior descending artery was a 2.5 mm vessel.  There was mild disease distally, and significant disease proximally.  The anastomosis was performed with a running 8-0 Prolene suture.  At the completion of the mammary to left anterior descending artery anastomosis, the Bulldog clamp was removed from the mammary artery.   Immediate nd rapid central re-warming was noted.  Lidocaine was administered.  The mammary pedicle was tacked up.  Cardio surface of the heart with interrupted 6-0 Prolene sutures after ensuring that there was good hemostasis at the anastomosis.  The aortic cross clamp was removed.  Total cross clamp time was 60 minutes.  A single defibrillation with 20 Joules was required.  The patient then resumed sinus rhythm.  The vein grafts were cut to length.  A partial occlusion clamp was placed on the ascending aorta.  The three proximal vein grafts anastomoses were then performed to 4.4 mm punch aortotomies with running 6-0 Prolene sutures.  At the completion of the final anastomosis the patient was placed in Trendelenburg position.  Before  tying the suture the partial clamp was removed, allowing any air to vent.  The suture then was tied, air was aspirated from the vein graft, the Bulldog clamp as removed, and flow was restored.  All proximal and distal anastomoses were inspected for hemostasis.  The patient was rewarmed.  Electrocardial pacing wires were placed on the right ventricle and right atrium.  The patient was weaned from cardiopulmonary bypass when the core temperature reached 37 degrees Celsius. The patient was weaned from bypass without difficulty.  Was on a dopamine drip at the time of separation from bypass.  Total bypass time was 136 minutes.  The patients initial cardiac index was greater than 2 liters per minute, he remained hemodynamically stable throughout the post-bypass period.  The test dose of protamine was administered and was well tolerated.  The aortic and atrial cannulae were removed, and the remainder of the Protamine was administered without incident.  The chest was irrigated with 1 liter of warm normal saline containing 1 g of vancomycin.  Hemostasis was achieved.  Left pleural two mediastinal chest tubes were placed through separate subcostal  incisions and secured with #1 silk sutures.  The pericardium was reapproximated over the base of the heart and ascending aorta with interrupted 3-0 silk sutures.  The sternum was closed with stainless steel wires, initially bringing the sternum together. The patient became hypotensive.  The sternum was allowed to separate.  The wires had not been tied at that time.  Inspection of the grafts revealed that they were patent and not kinked.  The patient quickly recovered his blood pressure with repeat closure of the chest.  There was no change in the patients hemodynamic status.  The wires were then tightened.  The pectoralis fascia was then closed ith a running #1 Vicryl suture.  Subcutaneous tissue was closed with a running 2-0 Vicryl suture.  The skin was closed with a 3-0 Vicryl subcuticular suture.  All  sponge, needle, and instrument counts were correct at the end of the procedure.  There were no intraoperative complications.  The patient was taken from the operating room to the surgical intensive care unit in stable condition. DD:  06/06/99 TD:  06/07/99 Job: [id number]

## 2010-10-06 NOTE — Discharge Summary (Signed)
[place]. [facility]  Patient:    [name]                          MRN: [id number] Adm. Date:  [id number] Disc. Date: 06/11/99 Attending:  Charlett Lango Dictator:   Sherrie George, P.A. CC:         Dr. Sherril Croon, Langston Masker. Dorris Fetch, M.D.                           Discharge Summary  DATE OF BIRTH:  May 13, 1936  ADMISSION DIAGNOSES: 1. Severe three-vessel coronary artery disease with progressive angina. 2. Claudication. 3. Hypertension. 4. Adult onset diabetes mellitus, diet controlled. 5. Hypercholesterolemia. 6. Obesity. 7. Mild renal insufficiency. 8. History of gout.  DISCHARGE DIAGNOSES: 1. Severe three-vessel coronary artery disease with progressive angina. 2. Claudication. 3. Hypertension. 4. Adult onset diabetes mellitus, diet controlled. 5. Hypercholesterolemia. 6. Obesity. 7. Mild renal insufficiency. 8. History of gout. 9. Postoperatively anemia.  PROCEDURES:  Coronary artery bypass grafting x 6: Left internal mammary to the AD, saphenous vein graft to the first diagonal, saphenous vein graft to the posterior descending, saphenous vein graft sequentially to the ramus, the OM, and the posterolateral May 27, 1999, Dr. Dorris Fetch.  BRIEF HISTORY:  The patient is a 75 year old black male with a history of diabetes, diet controlled, hypertension, hypercholesterolemia, mild renal insufficiency who presented with class III angina and recent non-Q-wave myocardial infarction approximately one month ago.  The patient underwent Cardiolite and then cardiac  catheterization.  The most severe lesion was in the LAD which was not amenable o percutaneous intervention.  The patient was referred to CVTS and Dr. Dorris Fetch for coronary artery bypass grafting.  The risks and discussed in detail. Informed consent was obtained.  The patient was admitted electively at this time for elective coronary artery bypass  grafting.  PAST MEDICAL HISTORY:  Significant for diet-controlled diabetes, hypertension, hypercholesterolemia.  She has a history of gout and is significantly overweight.  ALLERGIES:  None known.  MEDICATIONS ON ADMISSION: 1. Allopurinol 200 mg q.d. 2. Altace 10 mg q.d. 3. Pletal 100 mg b.i.d. 4. Lescol 40 mg q.h.s. 5. Aspirin 325 mg q.d. 6. Hydrochlorothiazide 25 mg q.a.m. 7. Sublingual nitroglycerin p.r.n.  SOCIAL HISTORY:  He was a heavy drinker at one time, quit 12 years ago.  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  He has never smoked.  For further History and Physical, please see the dictated noted.  HOSPITAL COURSE:  The patient was admitted, taken to the operating room, and underwent coronary artery bypass grafting x 6 as noted above.  The patient tolerated the procedure well and returned to the intensive care unit in satisfactory condition.  His weight was recorded as 260 pounds.  He remained stable overnight.  On the first postoperative morning, he was doing well.  He was stable from a cardiovascular standpoint on pulmonary toilet.  His creatinine was up to 2.0 from a baseline of 1.7.  He was on a low-dose dopamine drip.  He was anemic postoperatively with a  hematocrit of 29.  On the second postoperative day, weight was still elevated.  He was 277 pounds ith a preoperative weight of 260.  O2 saturations were 89%.  Chest tube had minimal  drainage.  Overall, he was doing well.  His pleural tube was removed.  His glucoses were followed, and these were fairly controlled with some sliding scale insulin. Plans were made to increase his activity, work on his pulmonary toilet, and recheck his renal function.  On the third postoperative day on the floor, he was hemodynamically stable, and no significant problems were found.  He was ambulating up to 460 feet at this time  with O2 saturations of 94% on room air.  By the fourth postoperative  day, he is afebrile.  Output is minimally higher than his intake.  His weight is still 16.7 pounds above his preoperative weight. Hemoglobin 8.4, hematocrit 25, white count 5.5, platelets 136,000. Electrolytes were normal.  BUN 34, creatinine 2.2, with a glucose of 135.  His chest is fairly clear.  Wounds look good.  He does have some serous drainage from his venectomy  site.  He has +1 to 2 edema in the venectomy leg.  His CGBs are relatively stable. He was previously on medicines for his diabetes, but they were controlled with iet for some time.  His normal sugars at home run between 79 and 89.  We increased is Lasix on April 21, 1999.  He increased his ambulation.  If he continues to do  well and BMET remains stable, we will aim for discharge in the a.m. of January 1 or January 22.  FOLLOWUP:  With Dr. Sherril Croon in [place] and Dr. Dorris Fetch in [place].  We are  going to ask the patient to see Dr. Sherril Croon in two weeks.  Dr. Dorris Fetch will see him in the office in one week to check his wounds and suture and then have him ack in three weeks.  When he goes to see Dr. Sherril Croon, we request a chest x-ray and ask the patient to bring with him.  DISCHARGE MEDICATIONS:   Tentatively at this time: 1. Zocor 10 mg p.o. h.s. 2. Pepcid 20 mg p.o. b.i.d. at a.c. 3. Lasix 40 mg q.d. x 1 month. 4. Potassium chloride 20 mEq 1 q.d. x 1 month. 5. Lopressor 50 mg 1/2 tablet q.12h. 6. Allopurinol 200 mg q.d.  He has this at home. 7. Pletal 100 mg p.o. b.i.d.  He also has this at home. 8. Tylox 1 to 2 p.o. q.4h. p.r.n.  DISCHARGE ACTIVITY:  Lift to moderate.  No lifting over 10 pounds.  No driving, no strenuous activity.  DISCHARGE INSTRUCTIONS:  Since he does have a fair amount of serous drainage from his lower extremity, we are going to request a home health nurse to monitor his  wounds and also to monitor his CBGs and to draw a BMET on the patient on June 14, 1999, with those  results being called to Dr. Sherril Croon and Dr. Dorris Fetch.  CONDITION ON DISCHARGE:  Improving. DD:  06/10/99  TD:  06/10/99 Job: [id number]

## 2011-02-13 LAB — POCT I-STAT 4, (NA,K, GLUC, HGB,HCT): Potassium: 4.7

## 2011-02-13 LAB — APTT: aPTT: 30

## 2011-02-13 LAB — PROTIME-INR: INR: 1

## 2011-02-15 LAB — DIFFERENTIAL
Basophils Absolute: 0
Basophils Absolute: 0
Basophils Absolute: 0
Basophils Relative: 0
Basophils Relative: 0
Eosinophils Absolute: 0.2
Eosinophils Relative: 1
Eosinophils Relative: 1
Eosinophils Relative: 3
Lymphocytes Relative: 10 — ABNORMAL LOW
Lymphocytes Relative: 9 — ABNORMAL LOW
Lymphs Abs: 0.7
Monocytes Absolute: 0.5
Monocytes Absolute: 0.7
Monocytes Relative: 11
Neutro Abs: 4.2
Neutro Abs: 6.2
Neutrophils Relative %: 84 — ABNORMAL HIGH

## 2011-02-15 LAB — CBC
HCT: 27.6 — ABNORMAL LOW
HCT: 28.1 — ABNORMAL LOW
HCT: 29.2 — ABNORMAL LOW
Hemoglobin: 9.1 — ABNORMAL LOW
Hemoglobin: 9.1 — ABNORMAL LOW
Hemoglobin: 9.5 — ABNORMAL LOW
MCHC: 33.8
MCHC: 33.9
MCHC: 34.6
MCV: 91.7
MCV: 92.2
MCV: 92.2
Platelets: 142 — ABNORMAL LOW
Platelets: 169
Platelets: 178
RBC: 2.86 — ABNORMAL LOW
RBC: 2.98 — ABNORMAL LOW
RBC: 3.05 — ABNORMAL LOW
RBC: 3.06 — ABNORMAL LOW
RBC: 3.09 — ABNORMAL LOW
RBC: 3.17 — ABNORMAL LOW
RDW: 14.8
RDW: 14.8
RDW: 14.9
WBC: 6.6
WBC: 6.7
WBC: 7.3
WBC: 7.6

## 2011-02-15 LAB — BASIC METABOLIC PANEL
Calcium: 8.8
GFR calc Af Amer: 11 — ABNORMAL LOW
GFR calc non Af Amer: 9 — ABNORMAL LOW
Potassium: 4
Sodium: 140

## 2011-02-15 LAB — RENAL FUNCTION PANEL
BUN: 59 — ABNORMAL HIGH
BUN: 77 — ABNORMAL HIGH
CO2: 23
CO2: 25
Calcium: 9
Chloride: 103
Chloride: 104
Creatinine, Ser: 7.23 — ABNORMAL HIGH
Creatinine, Ser: 7.28 — ABNORMAL HIGH
GFR calc Af Amer: 9 — ABNORMAL LOW
GFR calc non Af Amer: 8 — ABNORMAL LOW
Glucose, Bld: 107 — ABNORMAL HIGH
Glucose, Bld: 130 — ABNORMAL HIGH
Phosphorus: 4.5
Potassium: 4.8
Sodium: 135

## 2011-02-15 LAB — COMPREHENSIVE METABOLIC PANEL
ALT: 60 — ABNORMAL HIGH
AST: 54 — ABNORMAL HIGH
Albumin: 3.1 — ABNORMAL LOW
Alkaline Phosphatase: 44
Alkaline Phosphatase: 45
Alkaline Phosphatase: 56
BUN: 67 — ABNORMAL HIGH
BUN: 81 — ABNORMAL HIGH
BUN: 86 — ABNORMAL HIGH
CO2: 25
CO2: 26
Chloride: 95 — ABNORMAL LOW
Chloride: 98
Creatinine, Ser: 8.63 — ABNORMAL HIGH
Creatinine, Ser: 9.44 — ABNORMAL HIGH
GFR calc non Af Amer: 6 — ABNORMAL LOW
GFR calc non Af Amer: 6 — ABNORMAL LOW
Glucose, Bld: 140 — ABNORMAL HIGH
Glucose, Bld: 152 — ABNORMAL HIGH
Potassium: 4.4
Potassium: 4.6
Potassium: 4.8
Sodium: 136
Total Bilirubin: 0.8
Total Bilirubin: 0.9
Total Protein: 5.9 — ABNORMAL LOW
Total Protein: 6.1

## 2011-02-15 LAB — URINE CULTURE: Culture: NO GROWTH

## 2011-02-15 LAB — URINE MICROSCOPIC-ADD ON

## 2011-02-15 LAB — URINALYSIS, ROUTINE W REFLEX MICROSCOPIC
Bilirubin Urine: NEGATIVE
Ketones, ur: NEGATIVE
Nitrite: NEGATIVE
pH: 5

## 2011-02-15 LAB — PHOSPHORUS
Phosphorus: 4.3
Phosphorus: 6.6 — ABNORMAL HIGH
Phosphorus: 7 — ABNORMAL HIGH
Phosphorus: 8.2 — ABNORMAL HIGH

## 2011-02-15 LAB — PTH, INTACT AND CALCIUM
Calcium, Total (PTH): 8.4
PTH: 159.5 — ABNORMAL HIGH

## 2011-02-15 LAB — IRON AND TIBC
Iron: 30 — ABNORMAL LOW
Saturation Ratios: 14 — ABNORMAL LOW
TIBC: 221
UIBC: 191

## 2011-02-15 LAB — HEPATIC FUNCTION PANEL
ALT: 57 — ABNORMAL HIGH
Albumin: 2.9 — ABNORMAL LOW
Alkaline Phosphatase: 43
Indirect Bilirubin: 0.4
Total Bilirubin: 0.6

## 2011-02-15 LAB — MAGNESIUM: Magnesium: 2.7 — ABNORMAL HIGH

## 2011-02-16 LAB — RENAL FUNCTION PANEL
CO2: 25
Calcium: 8.9
Creatinine, Ser: 7.07 — ABNORMAL HIGH
GFR calc Af Amer: 9 — ABNORMAL LOW
GFR calc non Af Amer: 8 — ABNORMAL LOW

## 2011-02-16 LAB — CBC
MCHC: 33.3
MCV: 91.5
Platelets: 219
RBC: 3.31 — ABNORMAL LOW
RDW: 14.5

## 2011-03-06 ENCOUNTER — Inpatient Hospital Stay (HOSPITAL_COMMUNITY): Payer: Medicare Other

## 2011-03-06 ENCOUNTER — Inpatient Hospital Stay (HOSPITAL_COMMUNITY)
Admission: AD | Admit: 2011-03-06 | Discharge: 2011-03-10 | DRG: 871 | Disposition: A | Discharge status: Home / Self Care | Payer: Medicare Other | Source: Other Acute Inpatient Hospital | Attending: Family Medicine | Admitting: Family Medicine

## 2011-03-06 DIAGNOSIS — I2510 Atherosclerotic heart disease of native coronary artery without angina pectoris: Secondary | ICD-10-CM | POA: Diagnosis present

## 2011-03-06 DIAGNOSIS — E119 Type 2 diabetes mellitus without complications: Secondary | ICD-10-CM | POA: Diagnosis present

## 2011-03-06 DIAGNOSIS — G894 Chronic pain syndrome: Secondary | ICD-10-CM | POA: Diagnosis present

## 2011-03-06 DIAGNOSIS — N2581 Secondary hyperparathyroidism of renal origin: Secondary | ICD-10-CM | POA: Diagnosis present

## 2011-03-06 DIAGNOSIS — R5381 Other malaise: Secondary | ICD-10-CM | POA: Diagnosis present

## 2011-03-06 DIAGNOSIS — Z9049 Acquired absence of other specified parts of digestive tract: Secondary | ICD-10-CM

## 2011-03-06 DIAGNOSIS — M1990 Unspecified osteoarthritis, unspecified site: Secondary | ICD-10-CM | POA: Diagnosis present

## 2011-03-06 DIAGNOSIS — E785 Hyperlipidemia, unspecified: Secondary | ICD-10-CM | POA: Diagnosis present

## 2011-03-06 DIAGNOSIS — Z79899 Other long term (current) drug therapy: Secondary | ICD-10-CM

## 2011-03-06 DIAGNOSIS — A419 Sepsis, unspecified organism: Principal | ICD-10-CM | POA: Diagnosis present

## 2011-03-06 DIAGNOSIS — D631 Anemia in chronic kidney disease: Secondary | ICD-10-CM | POA: Diagnosis present

## 2011-03-06 DIAGNOSIS — B961 Klebsiella pneumoniae [K. pneumoniae] as the cause of diseases classified elsewhere: Secondary | ICD-10-CM | POA: Diagnosis present

## 2011-03-06 DIAGNOSIS — Z951 Presence of aortocoronary bypass graft: Secondary | ICD-10-CM

## 2011-03-06 DIAGNOSIS — N151 Renal and perinephric abscess: Secondary | ICD-10-CM | POA: Diagnosis present

## 2011-03-06 DIAGNOSIS — N12 Tubulo-interstitial nephritis, not specified as acute or chronic: Secondary | ICD-10-CM | POA: Diagnosis present

## 2011-03-06 DIAGNOSIS — N039 Chronic nephritic syndrome with unspecified morphologic changes: Secondary | ICD-10-CM | POA: Diagnosis present

## 2011-03-06 DIAGNOSIS — N186 End stage renal disease: Secondary | ICD-10-CM | POA: Diagnosis present

## 2011-03-06 DIAGNOSIS — I120 Hypertensive chronic kidney disease with stage 5 chronic kidney disease or end stage renal disease: Secondary | ICD-10-CM | POA: Diagnosis present

## 2011-03-06 HISTORY — DX: Essential (primary) hypertension: I10

## 2011-03-06 LAB — CBC
HCT: 27.8 % — ABNORMAL LOW (ref 39.0–52.0)
MCV: 92.7 fL (ref 78.0–100.0)
RBC: 3 MIL/uL — ABNORMAL LOW (ref 4.22–5.81)
RDW: 14 % (ref 11.5–15.5)
WBC: 17.4 10*3/uL — ABNORMAL HIGH (ref 4.0–10.5)

## 2011-03-06 LAB — BASIC METABOLIC PANEL
CO2: 27 mEq/L (ref 19–32)
Calcium: 9.4 mg/dL (ref 8.4–10.5)
Creatinine, Ser: 10.47 mg/dL — ABNORMAL HIGH (ref 0.50–1.35)

## 2011-03-06 LAB — GLUCOSE, CAPILLARY: Glucose-Capillary: 171 mg/dL — ABNORMAL HIGH (ref 70–99)

## 2011-03-06 LAB — MRSA PCR SCREENING: MRSA by PCR: NEGATIVE

## 2011-03-07 ENCOUNTER — Encounter (HOSPITAL_COMMUNITY): Payer: Self-pay | Admitting: Radiology

## 2011-03-07 ENCOUNTER — Inpatient Hospital Stay (HOSPITAL_COMMUNITY): Payer: Medicare Other

## 2011-03-07 LAB — GLUCOSE, CAPILLARY: Glucose-Capillary: 143 mg/dL — ABNORMAL HIGH (ref 70–99)

## 2011-03-07 LAB — DIFFERENTIAL
Basophils Relative: 0 % (ref 0–1)
Eosinophils Relative: 1 % (ref 0–5)
Monocytes Absolute: 0.6 10*3/uL (ref 0.1–1.0)
Monocytes Relative: 5 % (ref 3–12)
Neutrophils Relative %: 85 % — ABNORMAL HIGH (ref 43–77)

## 2011-03-07 LAB — HEMOGLOBIN A1C: Mean Plasma Glucose: 123 mg/dL — ABNORMAL HIGH (ref <117)

## 2011-03-07 LAB — BASIC METABOLIC PANEL
BUN: 66 mg/dL — ABNORMAL HIGH (ref 6–23)
CO2: 28 mEq/L (ref 19–32)
Calcium: 9.1 mg/dL (ref 8.4–10.5)
Chloride: 88 mEq/L — ABNORMAL LOW (ref 96–112)
Creatinine, Ser: 11.2 mg/dL — ABNORMAL HIGH (ref 0.50–1.35)
Glucose, Bld: 123 mg/dL — ABNORMAL HIGH (ref 70–99)

## 2011-03-07 LAB — CBC
HCT: 23.9 % — ABNORMAL LOW (ref 39.0–52.0)
Hemoglobin: 8.2 g/dL — ABNORMAL LOW (ref 13.0–17.0)
MCH: 31.2 pg (ref 26.0–34.0)
MCV: 90.9 fL (ref 78.0–100.0)
RBC: 2.63 MIL/uL — ABNORMAL LOW (ref 4.22–5.81)
WBC: 12.4 10*3/uL — ABNORMAL HIGH (ref 4.0–10.5)

## 2011-03-07 LAB — INFLUENZA PANEL BY PCR (TYPE A & B): Influenza A By PCR: NEGATIVE

## 2011-03-07 LAB — PHOSPHORUS: Phosphorus: 3.7 mg/dL (ref 2.3–4.6)

## 2011-03-08 ENCOUNTER — Inpatient Hospital Stay (HOSPITAL_COMMUNITY): Payer: Medicare Other

## 2011-03-08 LAB — BASIC METABOLIC PANEL
GFR calc Af Amer: 8 mL/min — ABNORMAL LOW (ref >90)
GFR calc non Af Amer: 7 mL/min — ABNORMAL LOW (ref >90)
Potassium: 4 mEq/L (ref 3.5–5.1)
Sodium: 133 mEq/L — ABNORMAL LOW (ref 135–145)

## 2011-03-08 LAB — URINALYSIS, ROUTINE W REFLEX MICROSCOPIC
Glucose, UA: NEGATIVE mg/dL
Protein, ur: >300 mg/dL — AB

## 2011-03-08 LAB — URINE MICROSCOPIC-ADD ON

## 2011-03-08 LAB — GLUCOSE, CAPILLARY
Glucose-Capillary: 111 mg/dL — ABNORMAL HIGH (ref 70–99)
Glucose-Capillary: 165 mg/dL — ABNORMAL HIGH (ref 70–99)
Glucose-Capillary: 112 mg/dL — ABNORMAL HIGH (ref 70–99)
Glucose-Capillary: 199 mg/dL — ABNORMAL HIGH (ref 70–99)

## 2011-03-08 LAB — DIFFERENTIAL
Basophils Relative: 0 % (ref 0–1)
Eosinophils Absolute: 0.1 10*3/uL (ref 0.0–0.7)
Lymphs Abs: 1.2 10*3/uL (ref 0.7–4.0)
Neutro Abs: 8.6 10*3/uL — ABNORMAL HIGH (ref 1.7–7.7)
Neutrophils Relative %: 81 % — ABNORMAL HIGH (ref 43–77)

## 2011-03-08 LAB — CBC
Platelets: 135 10*3/uL — ABNORMAL LOW (ref 150–400)
RBC: 2.85 MIL/uL — ABNORMAL LOW (ref 4.22–5.81)
WBC: 10.7 10*3/uL — ABNORMAL HIGH (ref 4.0–10.5)

## 2011-03-09 LAB — DIFFERENTIAL
Basophils Relative: 0 % (ref 0–1)
Eosinophils Absolute: 0.2 10*3/uL (ref 0.0–0.7)
Monocytes Absolute: 1.1 10*3/uL — ABNORMAL HIGH (ref 0.1–1.0)
Neutrophils Relative %: 77 % (ref 43–77)

## 2011-03-09 LAB — CBC
HCT: 32.2 % — ABNORMAL LOW (ref 39.0–52.0)
MCHC: 33.9 g/dL (ref 30.0–36.0)
RDW: 14 % (ref 11.5–15.5)

## 2011-03-09 LAB — GLUCOSE, CAPILLARY: Glucose-Capillary: 228 mg/dL — ABNORMAL HIGH (ref 70–99)

## 2011-03-09 NOTE — Consult Note (Signed)
NAMEKRISHNA, DANCEL                ACCOUNT NO.:  [id number]  MEDICAL RECORD NO.:  [id number]  LOCATION:  5503                         FACILITY:  [facility]  PHYSICIAN:  Oriah Leinweber C. Vernie Ammons, M.D.  DATE OF BIRTH:  21-Oct-1935  DATE OF CONSULTATION:  03/08/2011 DATE OF DISCHARGE:                                CONSULTATION   I was asked to see [name] who is a 75 year old male for further evaluation of pyelonephritis with right ureteral dilatation.  The patient was admitted with generalized weakness after developing fever and chills.  He was found to have temperature to 103.2.  He has a history of pyelonephritis in July 2010, that was not associated with any obstruction.  It appeared at that time that he had bilateral pyelonephritis.  Now, he has undergone a CT scan which has revealed dilatation of the upper ureter.  He makes a significant amount of urine throughout the day, but denies any dysuria, hematuria, or change in his voiding pattern.  He also denies any flank pain or suprapubic discomfort.  He is feeling better now and has defervesced.  He is on hemodialysis for chronic renal insufficiency.  He has not had any nausea, vomiting, abdominal pain, or diarrhea.  His myalgias and fever would be considered of moderate severity with no modifying factors. This began initially at home and he was taken to an outside hospital and transferred here.  PAST MEDICAL HISTORY\: 1. End-stage renal disease secondary to diabetes. 2. Type 2 diabetes. 3. Hypertension. 4. Secondary hyperparathyroidism. 5. Dyslipidemia. 6. Degenerative joint disease. 7. Anemia. 8. History of pyelonephritis.  SURGICAL HISTORY:  He has had partial transverse colectomy for what he describes as infection in July 2009.  SOCIAL HISTORY:  He denies alcohol or drug use.  He uses snuff.  FAMILY HISTORY:  Noncontributory.  ALLERGIES:  NKDA.  MEDICATIONS PRIOR TO ADMISSION:  Tylenol, tramadol,  renal vitamins, ibuprofen,  fexofenadine, cyclobenzaprine, cilostazol, calcium carbonate, aspirin, allopurinol, vitamin B12, Meclizine, metoprolol.  PHYSICAL EXAMINATION:  VITAL SIGNS:  His temperature is 98.3, pulse 84, respirations 18, blood pressure is 125/67. GENERAL:  The patient is a well-developed, well-nourished black male, in no apparent distress. HEENT:  Atraumatic, normocephalic.  Oropharynx is clear. NECK:  Supple with midline trachea. CHEST:  Reveals normal respiratory effort. CARDIOVASCULAR:  Regular rate and rhythm. ABDOMEN:  Soft, nontender, without mass or HSM.  He had no CVAT.  He has some healed midline scar. GU:  Reveals normal male phallus with a normal glans and meatus.  His scrotum, testicles, epididymis, anus, and perineum are normal.  He has normal rectal tone.  His prostate is smooth and symmetric and has no suspicious nodularity or induration. SKIN:  Warm and dry. EXTREMITIES:  Without clubbing, cyanosis, or edema.  He has a left forearm AV graft. NEUROLOGIC:  He has no gross focal neurologic deficits and he is alert and oriented with appropriate mood and affect.  LABORATORY RESULTS:  White count on admission was 17.4, it is now 10.7. His urinalysis with leukocyte esterase and nitrite negative with 21-50 white blood cells, and rare bacteria.  Procalcitonin level is 175. Blood cultures done on March 05, 2011, were positive x2  for Klebsiella pneumoniae, sensitive to all antibiotics except ampicillin.  Repeat blood cultures on March 06, 2011, were negative x2.  Repeat urine culture remains pending.  CT scan images were independently reviewed.  I noted a simple cyst within the left kidney.  He has no renal calculi noted in either kidney. He has perinephric stranding on the right-hand side and the right kidney has no dilatation of the calyceal system.  The upper ureter does appear dilated on the right-hand side, but it then transitions to normal ureter and there is no evidence of  mass or stone in this location.  The distal ureter reveals no dilatation.  IMPRESSION:  Positive blood cultures and fever to 103 with perinephric stranding on CT, are consistent with a right pyelonephritis with urosepsis.  Since he has stranding on the right-hand side, and apparently pyelo there, I would expect CVAT if he was obstructed and had a pyelonephrosis although he has no tenderness whatsoever on exam in the flank region.  The dilatation is likely secondary to infection and I doubt obstruction exists with a white count that is improving, fever that is decreasing and absent CVAT.  I do not feel that stenting is indicated at this time, nor is a retrograde pyelogram indicated.  RECOMMENDATION: 1. Continue intravenous antibiotics. 2. I would discontinue for a total of 10 days of oral antibiotics. 3. No further urologic workup is indicated at this time.     [name] C. Vernie Ammons, M.D.     MCO/MEDQ  D:  03/08/2011  T:  03/09/2011  Job:  [id number]  cc:   Garnetta Buddy, M.D.  Electronically Signed by Ihor Gully M.D. on 03/09/2011 06:52:53 AM

## 2011-03-10 ENCOUNTER — Inpatient Hospital Stay (HOSPITAL_COMMUNITY): Payer: Medicare Other

## 2011-03-10 LAB — CBC
MCV: 92.3 fL (ref 78.0–100.0)
Platelets: 189 10*3/uL (ref 150–400)
RBC: 3.26 MIL/uL — ABNORMAL LOW (ref 4.22–5.81)
WBC: 8.1 10*3/uL (ref 4.0–10.5)

## 2011-03-10 LAB — RENAL FUNCTION PANEL
Albumin: 3 g/dL — ABNORMAL LOW (ref 3.5–5.2)
CO2: 26 mEq/L (ref 19–32)
Creatinine, Ser: 7.95 mg/dL — ABNORMAL HIGH (ref 0.50–1.35)
GFR calc Af Amer: 7 mL/min — ABNORMAL LOW (ref >90)

## 2011-03-11 LAB — URINE CULTURE
Colony Count: 4000
Culture  Setup Time: 201210180903

## 2011-03-12 LAB — CULTURE, BLOOD (ROUTINE X 2)
Culture  Setup Time: 201210162333
Culture  Setup Time: 201210162333
Culture: NO GROWTH

## 2011-03-13 NOTE — H&P (Signed)
[name], BRUBACHER                ACCOUNT NO.:  [id number]  MEDICAL RECORD NO.:  [id number]  LOCATION:  5501                         FACILITY:  [facility]  PHYSICIAN:  Thad Ranger, MD       DATE OF BIRTH:  Aug 31, 1935  DATE OF ADMISSION:  03/06/2011 DATE OF DISCHARGE:                             HISTORY & PHYSICAL   PRIMARY CARE PHYSICIAN:  Doreen Beam, MD in [place].  PRIMARY NEPHROLOGIST:  Garnetta Buddy, MD  CHIEF COMPLAINT:  The patient transferred from [facility] for sepsis and hemodialysis.  HISTORY OF PRESENT ILLNESS:  Mr. Meidinger is a 75 year old male with history of hypertension; hyperlipidemia; non-insulin-dependent diabetes mellitus; end-stage renal disease, on hemodialysis Tuesday, Thursday, and Saturday; history of coronary artery disease who initially presented to the [facility] emergency room with fevers and riders at home. History was obtained from the patient and his daughter present in the room.  Per the patient, he lives at home and in the last 2 days was having fevers with highest recorded at 103.  He states that he was having some shortness of breath with cough, chills, and sore throat.  He has had a loss of appetite and increasing generalized weakness.  He denied any vomiting, abdominal pain, diarrhea, constipation, melena, dysuria, or hematuria.  He felt increasing generalized myalgias.  The patient was transferred to [facility] for continued care and management including hemodialysis.  The patient otherwise on my encounter is alert, awake, and oriented, not in any acute distress.  On the floor, the patient is afebrile.  The patient previously had a history of pyelonephritis in the past.  PAST MEDICAL HISTORY: 1. History of prior pyelonephritis in 2010. 2. End-stage renal disease on hemodialysis Tuesday, Thursday, and     Saturday. 3. Hypertension. 4. Hyperlipidemia. 5. History of coronary artery disease status post bypass 3-vessel  in     2001. 6. Non-insulin-dependent diabetes mellitus. 7. History of segmental colon resection in 2009 at [facility]. 8. Degenerative joint disease.  SOCIAL HISTORY:  The patient denies any alcohol or drug use.  He uses snuff.  The patient states that he is otherwise functional with his ADLs.  ALLERGIES:  No known allergies.  MEDICATIONS:  Prior to admission, 1. Tylenol Extra Strength 500 mg 2 tablets daily at bedtime. 2. Tramadol 50 mg p.o. every 6 hours as needed. 3. Renal vitamin 1 tablet daily. 4. Ibuprofen 200 mg 2 tablets daily as needed. 5. Fexofenadine 60 mg p.o. b.i.d. 6. Cyclobenzaprine 10 mg t.i.d. as needed. 7. Cilostazol 100 mg p.o. b.i.d. 8. Calcium carbonate 750 mg 1-2 tablets t.i.d. with meals. 9. Aspirin 81 mg daily. 10.Allopurinol 100 mg daily. 11.Vitamin B12 1000 mcg p.o. b.i.d. 12.Meclizine 25 mg daily. 13.Metoprolol tartrate 25 mg 1 tablet p.o. b.i.d.  PHYSICAL EXAM:  VITAL SIGNS:  Temperature 97.7, pulse 80, respirations 17, blood pressure 195/55, O2 sats 99% on room air. GENERAL:  The patient is alert, awake, and oriented x3, not in acute distress. HEENT:  Anicteric sclerae.  Pink conjunctivae.  Pupils reactive to light and accommodation.  EOMI. NECK:  Supple.  No lymphadenopathy. CVS:  S1 and S2 clear. CHEST:  Clear to  auscultation bilaterally. ABDOMEN:  Soft, nontender, nondistended.  No CV angle tenderness noted. EXTREMITIES:  No cyanosis, clubbing, or edema noted in upper or lower extremities bilaterally.  Left forearm AV graft clean without erythema, warmth, purulence, or tenderness.  LAB AND DIAGNOSTIC DATA:  BMET; sodium 133, potassium 4.11, BUN 56, creatinine 10.47, calcium 9.4.  CBC still pending at the time of my dictation.  The patient, however, had the labs at [facility] on March 05, 2011, which showed white count of 9.2, hemoglobin 10.0, hematocrit 30.1, platelets of 150.  UA positive for UTI.  Blood cultures,  preliminary positive for Gram-negative rods.  Chest x-ray at [facility] showed shallow inspiration with linear atelectasis or infiltration in the lung bases.  IMPRESSION AND PLAN:  Mr. Flegel is a 75 year old male with end-stage renal disease, on hemodialysis presenting with sepsis secondary to urinary tract infection. 1. Sepsis with Gram-negative rods bacteremia likely secondary to     urinary tract infection.  The patient is admitted under medical     service.  As the blood cultures are positive for Gram-negative     rods, I will start the patient on Fortaz.  We will repeat urine     cultures and blood cultures here at [facility].  If he     starts to spike any fevers or leukocytosis, I will add vancomycin     and Zosyn for broad-spectrum coverage.  Currently, the patient     appears to be stable.  I will also obtain CT of the abdomen and     pelvis to rule out any renal abscess or pyelonephritis.  The     patient did have a prior history of pyelonephritis in August 2010. 2. End-stage renal disease, currently stable on hemodialysis Tuesday,     Thursday, and Saturday.  The patient is to continue hemodialysisper his schedule.  Renal Service has been consulted and the patient     will be seen today. 3. Coronary artery disease status post CABG in 2001.  The patient will     be continued on aspirin; however, I will hold off on the beta-     blocker given the borderline hypotension. 4. History of hypertension.  The patient is currently borderline     hypotensive.  Hence, we will hold off on metoprolol.  I will also     obtain lactic acid level and procalcitonin. 5. Non-insulin-dependent diabetes mellitus.  We will place on sliding     scale insulin for now.  The patient is diet controlled at home. 6. Deconditioning and generalized weakness.  We will obtain physical     therapy evaluation during this hospitalization. 7. DVT prophylaxis, Lovenox.     Thad Ranger,  MD     RR/MEDQ  D:  03/06/2011  T:  03/06/2011  Job:  [id number]  cc:   Zetta Bills, MD Garnetta Buddy, M.D. Doreen Beam, MD  Electronically Signed by Andres Labrum Ceciley Buist  on 03/13/2011 01:38:01 PM

## 2011-03-16 NOTE — Discharge Summary (Signed)
[name], [name]                ACCOUNT NO.:  [id number]  MEDICAL RECORD NO.:  [id number]  LOCATION:  5503                         FACILITY:  [facility]  PHYSICIAN:  Pleas Koch, MD        DATE OF BIRTH:  03/10/2009  DATE OF ADMISSION:  03/06/2011 DATE OF DISCHARGE:                              DISCHARGE SUMMARY   DISCHARGE DIAGNOSES: 1. Sepsis, likely secondary to urinary source with pyelonephritis-     positive for Klebsiella pneumonia, currently on cefepime, day #4     therapy to complete 10 more doses as an outpatient. 2. Hypertension, moderately controlled off of most medications. 3. Diabetes mellitus.  Diet control with A1c, on this admission 5.9. 4. Coronary artery disease status post coronary artery bypass graft,     on aspirin. 5. Hyperlipidemia, stable problem. 6. History of colon resection in 2009, stable. 7. Chronic pain syndrome, currently on medications of the same. 8. End-stage renal disease, followed in the outpatient clinic and is a     Tuesday, Thursday, and Saturday candidate. 9. Likely, anemia of chronic disease secondary to end-stage renal     disease-on Aranesp and problem needs FESO 4 as well. 10.We will defer to Nephrology regarding this. 11.Secondary hyperparathyroidism-to continue calcium and Zemplar.  DISCHARGE MEDICATIONS:  As follows; 1. Tylenol 1000 at bedtime. 2. Lisinopril 2.5 daily. 3. Elemental calcium 200 mg t.i.d. 4. Allopurinol 100 mg daily. 5. Meclizine 25 mg daily. 6. I have discontinued his metoprolol given borderline blood     pressures. 7. Nepro 237 mL t.i.d. 8. Cyclobenzaprine 10 mg 1 tab daily. 9. Darbepoetin IV 150 mcg Tuesday with hemodialysis.  New medications; 1. Cefepime 2 g with hemodialysis Tuesday, Thursday, and Saturday.  To     continue this dosing of 11 more doses.  Special instructions, this     will need to be followed up with primary care physician outpatient     regular MD.  The patient will need a urine culture 2  weeks     subsequent to discontinuing antibiotics.  The patient was seen.     The patient will likely need a nephrectomy if he continues to have     pyelonephritis. 2. Cheratussin 1 teaspoon at bedtime. 3. Tramadol 50 mg 1 tab q.6 h. p.r.n. 4. Aspirin 81 mg daily. 5. I have discontinued his ibuprofen. 6. Promethazine 12.5 q.6 h. p.r.n. 7. Cilostazol 100 mg p.o. b.i.d. with meals. 8. Renal vitamin 1 tab daily. 9. Vitamin B12 1000 mcg b.i.d. 10.Precalciferol IV Tuesday, Thursday, and Saturday.  PERTINENT IMAGING:  As follows; 1. CT abdomen and pelvis on March 01, 2011, showed one right     perinephric stranding.  There is pseudoedema in the right renal     space, mild dilatation of the right ureter for obstructing stone.     a.     Findings are suggestive for an acute into major process      involving the right kidney and right renal collecting system.     b.     Intravenous contrast examination maybe, not be possible due      to elevated creatinine, but the patient may benefit neurological  consultation, possibly retrograde examination     c.     Chronic pancreatitis calcifications suggests previous      inflammation.  CONSULTATIONS:  Consults on this case; 1. Richard F. Caryn Section, MD, Nephrology. 2. Loraine Leriche C. Vernie Ammons, MD of Urology. 3. Garnetta Buddy, MD.  THINGS TO DO: 1. Please get urine culture 10 days after completion of antibiotics,     then report to either nephrologist or primary care physician, Dr.     Doreen Beam, in [place]. 2. The patient will need a CBC at next blood draw with hemodialysis on     Tuesday, to determine his white count. 3. The patient will need an A1c in about 3 months to determine his     blood sugar control. 4. Please re-evaluate his need for antihypertensive medication.  HISTORY:  [name] is a pleasant 75 year old African American male, who presented from [facility] to [facility] team because of high fever.  He had some shortness of  breath with cough, chills, sore throat, loss of appetite, generalized weakness, felt increasing myalgias and was transferred for continued care and management including hemodialysis. He was alert, oriented, awake, had no distress and was afebrile and he has a history of pyelonephritis in 2010.  HOSPITAL COURSE:  According to issue; 1. Sepsis with gram-negative rods bacteremia, likely secondary to     urinary tract infection.  His blood cultures here improved     negative, as he was already on antibiotics.  They were repeated,     but did not grow anything.  Urine cultures were obtained from prior     hospitalization that was noted as lactic acid on admission was 2.3.     Procalcitonin was above 175. 2. He also had a flu panel that was negative. 3. His white count trended down.  On admission, white count of 17,000     throughout course of hospitalization to 11.2 and he remains     afebrile.  The patient will continue antibiotics as per above. 4. Hypertension.  The patient was moderately controlled on no meds,     and I have implemented low-dose lisinopril for decrease of     proteinuria; although, this is a point given in fact he has end-     stage renal disease. 5. Coronary artery disease status post CABG.  The patient will     continue on aspirin and will continue on a stroke prevention     cardiac remodeling. 6. Non-insulin-dependent diabetes mellitus.  The patient will be     discharged home without any insulin.  He is encouraged to have an     A1c in a couple of months to determine his need for the same.  Diet     and exercise would be plus for him. 7. Hyperlipidemia.  The patient will continue on his statin as an     outpatient. 8. End-stage renal disease.  This is per nephrologist Dr. Caryn Section.  Dr.     Caryn Section was following him and recommended to streamline his antibiotics     ceftazidime 2 g every time after hemodialysis.  He was initially on     Fortaz at [facility] and was  then subsequently on vanc and     Zosyn for broader spectrum coverage. 9. Possible kidney abscess.  Dr. Vernie Ammons was consulted with regards to     his recurrent pyelonephritis.  It was not thought that he needed     specific intervention in  terms of this being nephrolithiasis and     his white count was improving.  It was not felt that stenting was     indicated.  No retrograde pyelogram and he may need to be followed     as an outpatient setting.  If this does recur.  However at this     time, he is stable. 10.Chronic pain.  He will continue on cilostazol and cyclobenzaprine. 11.Anemia of chronic disease.  The patient is on Aranesp and probably     need FESO4 as well or iron.  I will defer this decision to renal,     who will be managing him as an outpatient conjunct with Dr. Sherril Croon. 12.Secondary hyperparathyroidism.  Continue calcium and Zemplar. 13.The patient was seen today and is doing great, had no further     issues, was actually asleep.  The patient will likely need to be     discharged home tomorrow as ceftazidime is not available at the     dialysis center.  The patient will get the dialysis tomorrow,     receive a dose of ceftazidime and be discharged home with close     followup in the outpatient setting.  PHYSICAL EXAMINATION:  VITAL SIGNS:  Today were as follows, temperature 98.2, pulse 85, respirations 19, blood pressure 111-133/61-83, and O2 sats 92% on room air. ABDOMEN:  Soft, nontender, and nondistended. HEART:  S1 and S2.  Possible murmur on right upper sternal edge.  No rebound.  No guarding.  No CVA tenderness. HEENT:  No blurred vision.  No double vision.  Poor dentition.  It is pleasure taking care of this patient.  Short addendum will be done in the morning.  I spent over 30 minutes in time coordinating in addition to discussion with consultants and the patient over 30% of time was face-to-face time.          ______________________________ Pleas Koch,  MD     JS/MEDQ  D:  03/09/2011  T:  03/09/2011  Job:  [id number]  cc:   Wilber Bihari. Caryn Section, M.D.  Electronically Signed by Pleas Koch MD on 03/16/2011 02:49:33 PM

## 2011-03-16 NOTE — Discharge Summary (Signed)
  [name], [name]                ACCOUNT NO.:  [id number]  MEDICAL RECORD NO.:  [id number]  LOCATION:  5503                         FACILITY:  [facility]  PHYSICIAN:  Pleas Koch, MD        DATE OF BIRTH:  December 11, 1935  DATE OF ADMISSION:  03/06/2011 DATE OF DISCHARGE:                              DISCHARGE SUMMARY   ADDENDUM  The patient was seen on day of discharge, doing well, had no further issues.  All that is pending is a repeat CBC and BMET.  PHYSICAL EXAMINATION:  VITAL SIGNS:  Temperature is 97.9, pulse 76, respirations 19, blood pressure 115/64, 97% on room air sats. CHEST:  Clear.  CTAB. HEART:  S1, S2.  No murmurs, rubs, or gallops. BACK:  No CVA tenderness.  Morbidly obese. HEENT:  Mallampati stage III. EXTREMITIES:  Soft, nontender, nondistended. ABDOMEN:  Soft.  The patient was discharged in stable state.  No changes to medications or other issues.          ______________________________ Pleas Koch, MD JS/MEDQ  D:  03/10/2011  T:  03/10/2011  Job:  [id number]  Electronically Signed by Pleas Koch MD on 03/16/2011 02:49:38 PM

## 2013-09-09 ENCOUNTER — Ambulatory Visit (INDEPENDENT_AMBULATORY_CARE_PROVIDER_SITE_OTHER): Payer: Medicare Other | Admitting: Vascular Surgery

## 2013-09-09 ENCOUNTER — Encounter: Payer: Self-pay | Admitting: Vascular Surgery

## 2013-09-09 ENCOUNTER — Other Ambulatory Visit: Payer: Self-pay

## 2013-09-09 VITALS — BP 159/61 | HR 86 | Resp 16 | Ht 70.0 in | Wt 230.0 lb

## 2013-09-09 DIAGNOSIS — N186 End stage renal disease: Secondary | ICD-10-CM

## 2013-09-09 NOTE — Progress Notes (Signed)
[facility]  Patient name: [name] MRN: [id number] DOB: 03/31/1936 Sex: male  REASON FOR VISIT: Bleeding from left arm graft.  HPI: [name] is a 78 y.o. male who is had a left forearm graft for 6 years according to the patient. He dialyzes on Tuesdays Thursdays and Saturdays. He's had an eschar on the lateral aspect of the graft were a while but recently had a bleeding episode from this. There is an area in the graft with an eschar approximately 5 mm in diameter which is been present.  He denies fever or chills.   Past Medical History  Diagnosis Date  . Renal insufficiency   . Diabetes mellitus   . Hypertension    History reviewed. No pertinent family history. SOCIAL HISTORY: History  Substance Use Topics  . Smoking status: Never Smoker   . Smokeless tobacco: Current User  . Alcohol Use: No   Allergies  Allergen Reactions  . Iohexol Swelling and Rash   No current outpatient prescriptions on file.   No current facility-administered medications for this visit.   REVIEW OF SYSTEMS: Arly.Keller[X ] denotes positive finding; [  ] denotes negative finding  CARDIOVASCULAR:  [ ]  chest pain   [ ]  chest pressure   [ ]  palpitations   [ ]  orthopnea   [ ]  dyspnea on exertion   Arly.Keller[X ] claudication   [ ]  rest pain   [ ]  DVT   [ ]  phlebitis PULMONARY:   [ ]  productive cough   [ ]  asthma   [ ]  wheezing NEUROLOGIC:   [ ]  weakness  [ ]  paresthesias  [ ]  aphasia  [ ]  amaurosis  [ ]  dizziness HEMATOLOGIC:   [ ]  bleeding problems   [ ]  clotting disorders MUSCULOSKELETAL:  [ ]  joint pain   [ ]  joint swelling [ ]  leg swelling GASTROINTESTINAL: [ ]   blood in stool  [ ]   hematemesis GENITOURINARY:  [ ]   dysuria  [ ]   hematuria PSYCHIATRIC:  [ ]  history of major depression INTEGUMENTARY:  [ ]  rashes  [ ]  ulcers CONSTITUTIONAL:  [ ]  fever   [ ]  chills  PHYSICAL EXAM: Filed Vitals:   09/09/13 1221  BP: 159/61  Pulse: 86  Resp: 16  Height: 5\' 10"  (1.778 m)  Weight:  230 lb (104.327 kg)   Body mass index is 33 kg/(m^2). GENERAL: The patient is a well-nourished male, in no acute distress. The vital signs are documented above. CARDIOVASCULAR: There is a regular rate and rhythm.  PULMONARY: There is good air exchange bilaterally without wheezing or rales. His left forearm AV graft is pulsatile. The ulnar aspect of the graft is aneurysmal. Along the radial aspect of the graft there is an eschar that measures approximately 5-6 mm in diameter. Currently there is no bleeding. No drainage or erythema currently. NEUROLOGIC: No focal weakness or paresthesias are detected. SKIN: There are no ulcers or rashes noted. PSYCHIATRIC: The patient has a normal affect.  MEDICAL ISSUES: This patient has an eschar over the radial aspect of the graft which will require revision because of the risk for bleeding. He dialyzes on Tuesdays Thursdays and Saturdays I have arranged revision of the graft this Friday. I've explained that if he does develop bleeding from this area to hold digital pressure over the one specific area in call 911. Given that the graft is pulsatile I will also likely shoot a fistulogram at the time of surgery and proceeded disease  amenable to venoplasty this could potentially be done at the same time. The surgery scheduled for 09/11/2013 to  Christopher S Dickson [facility] [place] Beeper: [phone]    

## 2013-09-10 ENCOUNTER — Encounter (HOSPITAL_COMMUNITY): Payer: Self-pay | Admitting: *Deleted

## 2013-09-10 MED ORDER — DEXTROSE 5 % IV SOLN
1.5000 g | INTRAVENOUS | Status: AC
  Administered 2013-09-11: 1.5 g via INTRAVENOUS
  Filled 2013-09-10: qty 1.5

## 2013-09-10 NOTE — Progress Notes (Signed)
[name] reports that he has not seen a cardiologist in many years.  Patient had CABG "15" years ago. [name] had an EKG and chest at Dr Bascom LevelsVanEky office in the past few months.  I spoke to the office and faxed a request for EKG, Chest and last office notes.  Office verified patient has had these done.

## 2013-09-11 ENCOUNTER — Encounter (HOSPITAL_COMMUNITY): Payer: Medicare Other | Admitting: Anesthesiology

## 2013-09-11 ENCOUNTER — Encounter (HOSPITAL_COMMUNITY)
Admission: RE | Disposition: A | Discharge status: Home / Self Care | Payer: Self-pay | Source: Ambulatory Visit | Attending: Vascular Surgery

## 2013-09-11 ENCOUNTER — Ambulatory Visit (HOSPITAL_COMMUNITY): Payer: Medicare Other | Admitting: Anesthesiology

## 2013-09-11 ENCOUNTER — Ambulatory Visit (HOSPITAL_COMMUNITY)
Admission: RE | Admit: 2013-09-11 | Discharge: 2013-09-11 | Disposition: A | Discharge status: Home / Self Care | Payer: Medicare Other | Source: Ambulatory Visit | Attending: Vascular Surgery | Admitting: Vascular Surgery

## 2013-09-11 ENCOUNTER — Encounter (HOSPITAL_COMMUNITY): Payer: Self-pay | Admitting: *Deleted

## 2013-09-11 DIAGNOSIS — I120 Hypertensive chronic kidney disease with stage 5 chronic kidney disease or end stage renal disease: Secondary | ICD-10-CM | POA: Insufficient documentation

## 2013-09-11 DIAGNOSIS — Y832 Surgical operation with anastomosis, bypass or graft as the cause of abnormal reaction of the patient, or of later complication, without mention of misadventure at the time of the procedure: Secondary | ICD-10-CM | POA: Insufficient documentation

## 2013-09-11 DIAGNOSIS — N186 End stage renal disease: Secondary | ICD-10-CM | POA: Insufficient documentation

## 2013-09-11 DIAGNOSIS — T82898A Other specified complication of vascular prosthetic devices, implants and grafts, initial encounter: Secondary | ICD-10-CM

## 2013-09-11 DIAGNOSIS — Z992 Dependence on renal dialysis: Secondary | ICD-10-CM | POA: Insufficient documentation

## 2013-09-11 DIAGNOSIS — I2510 Atherosclerotic heart disease of native coronary artery without angina pectoris: Secondary | ICD-10-CM | POA: Insufficient documentation

## 2013-09-11 DIAGNOSIS — M129 Arthropathy, unspecified: Secondary | ICD-10-CM | POA: Insufficient documentation

## 2013-09-11 HISTORY — DX: End stage renal disease: N18.6

## 2013-09-11 HISTORY — DX: Constipation, unspecified: K59.00

## 2013-09-11 HISTORY — DX: Angina pectoris, unspecified: I20.9

## 2013-09-11 HISTORY — PX: REVISON OF ARTERIOVENOUS FISTULA: SHX6074

## 2013-09-11 HISTORY — DX: Atherosclerotic heart disease of native coronary artery without angina pectoris: I25.10

## 2013-09-11 HISTORY — DX: Pneumonia, unspecified organism: J18.9

## 2013-09-11 HISTORY — DX: Unspecified osteoarthritis, unspecified site: M19.90

## 2013-09-11 LAB — POCT I-STAT 4, (NA,K, GLUC, HGB,HCT)
Glucose, Bld: 63 mg/dL — ABNORMAL LOW (ref 70–99)
HEMATOCRIT: 31 % — AB (ref 39.0–52.0)
HEMOGLOBIN: 10.5 g/dL — AB (ref 13.0–17.0)
Potassium: 4.3 mEq/L (ref 3.7–5.3)
Sodium: 142 mEq/L (ref 137–147)

## 2013-09-11 LAB — GLUCOSE, CAPILLARY
Glucose-Capillary: 59 mg/dL — ABNORMAL LOW (ref 70–99)
Glucose-Capillary: 69 mg/dL — ABNORMAL LOW (ref 70–99)
Glucose-Capillary: 74 mg/dL (ref 70–99)

## 2013-09-11 SURGERY — REVISON OF ARTERIOVENOUS FISTULA
Anesthesia: Monitor Anesthesia Care | Site: Arm Lower | Laterality: Left

## 2013-09-11 MED ORDER — THROMBIN 20000 UNITS EX SOLR
CUTANEOUS | Status: DC | PRN
  Administered 2013-09-11: 09:00:00 via TOPICAL

## 2013-09-11 MED ORDER — DIPHENHYDRAMINE HCL 50 MG/ML IJ SOLN
INTRAMUSCULAR | Status: DC | PRN
  Administered 2013-09-11: 25 mg via INTRAVENOUS

## 2013-09-11 MED ORDER — LABETALOL HCL 5 MG/ML IV SOLN
INTRAVENOUS | Status: DC | PRN
  Administered 2013-09-11 (×4): 5 mg via INTRAVENOUS

## 2013-09-11 MED ORDER — PROPOFOL 10 MG/ML IV BOLUS
INTRAVENOUS | Status: AC
  Filled 2013-09-11: qty 20

## 2013-09-11 MED ORDER — METHYLPREDNISOLONE SODIUM SUCC 125 MG IJ SOLR
INTRAMUSCULAR | Status: DC | PRN
  Administered 2013-09-11: 125 mg via INTRAVENOUS

## 2013-09-11 MED ORDER — SUCCINYLCHOLINE CHLORIDE 20 MG/ML IJ SOLN
INTRAMUSCULAR | Status: AC
  Filled 2013-09-11: qty 1

## 2013-09-11 MED ORDER — PROTAMINE SULFATE 10 MG/ML IV SOLN
INTRAVENOUS | Status: DC | PRN
  Administered 2013-09-11 (×4): 10 mg via INTRAVENOUS

## 2013-09-11 MED ORDER — SODIUM CHLORIDE 0.9 % IR SOLN
Status: DC | PRN
  Administered 2013-09-11: 08:00:00

## 2013-09-11 MED ORDER — FENTANYL CITRATE 0.05 MG/ML IJ SOLN
INTRAMUSCULAR | Status: DC | PRN
  Administered 2013-09-11 (×3): 50 ug via INTRAVENOUS

## 2013-09-11 MED ORDER — SODIUM CHLORIDE 0.9 % IJ SOLN
INTRAMUSCULAR | Status: AC
  Filled 2013-09-11: qty 10

## 2013-09-11 MED ORDER — EPHEDRINE SULFATE 50 MG/ML IJ SOLN
INTRAMUSCULAR | Status: AC
  Filled 2013-09-11: qty 1

## 2013-09-11 MED ORDER — METHYLPREDNISOLONE SODIUM SUCC 125 MG IJ SOLR
INTRAMUSCULAR | Status: AC
  Filled 2013-09-11: qty 2

## 2013-09-11 MED ORDER — SODIUM CHLORIDE 0.9 % IR SOLN
Status: DC | PRN
  Administered 2013-09-11: 1000 mL

## 2013-09-11 MED ORDER — LIDOCAINE HCL (PF) 1 % IJ SOLN
INTRAMUSCULAR | Status: AC
  Filled 2013-09-11: qty 30

## 2013-09-11 MED ORDER — PROPOFOL 10 MG/ML IV BOLUS
INTRAVENOUS | Status: DC | PRN
  Administered 2013-09-11: 100 mg via INTRAVENOUS
  Administered 2013-09-11: 60 mg via INTRAVENOUS
  Administered 2013-09-11: 40 mg via INTRAVENOUS

## 2013-09-11 MED ORDER — FAMOTIDINE IN NACL 20-0.9 MG/50ML-% IV SOLN
20.0000 mg | INTRAVENOUS | Status: AC
  Administered 2013-09-11: 20 mg via INTRAVENOUS
  Filled 2013-09-11: qty 50

## 2013-09-11 MED ORDER — LIDOCAINE HCL (PF) 1 % IJ SOLN
INTRAMUSCULAR | Status: DC | PRN
  Administered 2013-09-11: 24 mL via INTRADERMAL

## 2013-09-11 MED ORDER — HEPARIN SODIUM (PORCINE) 1000 UNIT/ML IJ SOLN
INTRAMUSCULAR | Status: DC | PRN
  Administered 2013-09-11: 8000 [IU] via INTRAVENOUS
  Administered 2013-09-11: 2000 [IU] via INTRAVENOUS

## 2013-09-11 MED ORDER — LIDOCAINE HCL (CARDIAC) 20 MG/ML IV SOLN
INTRAVENOUS | Status: DC | PRN
  Administered 2013-09-11: 40 mg via INTRAVENOUS

## 2013-09-11 MED ORDER — SODIUM CHLORIDE 0.9 % IV SOLN
INTRAVENOUS | Status: DC | PRN
  Administered 2013-09-11: 07:00:00 via INTRAVENOUS

## 2013-09-11 MED ORDER — PROPOFOL INFUSION 10 MG/ML OPTIME
INTRAVENOUS | Status: DC | PRN
  Administered 2013-09-11: 50 ug/kg/min via INTRAVENOUS

## 2013-09-11 MED ORDER — DIPHENHYDRAMINE HCL 50 MG/ML IJ SOLN
INTRAMUSCULAR | Status: AC
  Filled 2013-09-11: qty 1

## 2013-09-11 MED ORDER — LIDOCAINE HCL (CARDIAC) 20 MG/ML IV SOLN
INTRAVENOUS | Status: AC
  Filled 2013-09-11: qty 5

## 2013-09-11 MED ORDER — IOHEXOL 300 MG/ML  SOLN
INTRAMUSCULAR | Status: DC | PRN
  Administered 2013-09-11: 50 mL via INTRAVENOUS

## 2013-09-11 MED ORDER — FENTANYL CITRATE 0.05 MG/ML IJ SOLN
INTRAMUSCULAR | Status: AC
  Filled 2013-09-11: qty 5

## 2013-09-11 MED ORDER — THROMBIN 20000 UNITS EX SOLR
CUTANEOUS | Status: AC
  Filled 2013-09-11: qty 20000

## 2013-09-11 MED ORDER — PAPAVERINE HCL 30 MG/ML IJ SOLN
INTRAMUSCULAR | Status: AC
  Filled 2013-09-11: qty 2

## 2013-09-11 MED ORDER — OXYCODONE-ACETAMINOPHEN 5-325 MG PO TABS: 1.0000 | ORAL_TABLET | Freq: Four times a day (QID) | ORAL | Status: DC | PRN

## 2013-09-11 MED ORDER — LABETALOL HCL 5 MG/ML IV SOLN
INTRAVENOUS | Status: AC
  Filled 2013-09-11: qty 4

## 2013-09-11 SURGICAL SUPPLY — 49 items
ADH SKN CLS APL DERMABOND .7 (GAUZE/BANDAGES/DRESSINGS) ×1
BAG BANDED W/RUBBER/TAPE 36X54 (MISCELLANEOUS) ×2 IMPLANT
BAG EQP BAND 135X91 W/RBR TAPE (MISCELLANEOUS) ×1
CANISTER SUCTION 2500CC (MISCELLANEOUS) ×3 IMPLANT
CATH EMB 4FR 80CM (CATHETERS) ×2 IMPLANT
CLIP TI MEDIUM 6 (CLIP) ×3 IMPLANT
CLIP TI WIDE RED SMALL 6 (CLIP) ×3 IMPLANT
COVER DOME SNAP 22 D (MISCELLANEOUS) ×2 IMPLANT
COVER PROBE W GEL 5X96 (DRAPES) ×3 IMPLANT
COVER SURGICAL LIGHT HANDLE (MISCELLANEOUS) ×3 IMPLANT
DERMABOND ADVANCED (GAUZE/BANDAGES/DRESSINGS) ×2
DERMABOND ADVANCED .7 DNX12 (GAUZE/BANDAGES/DRESSINGS) ×1 IMPLANT
DRSG TEGADERM 4X4.75 (GAUZE/BANDAGES/DRESSINGS) ×2 IMPLANT
ELECT REM PT RETURN 9FT ADLT (ELECTROSURGICAL) ×3
ELECTRODE REM PT RTRN 9FT ADLT (ELECTROSURGICAL) ×1 IMPLANT
GAUZE SPONGE 2X2 8PLY STRL LF (GAUZE/BANDAGES/DRESSINGS) IMPLANT
GLOVE BIO SURGEON STRL SZ 6.5 (GLOVE) ×2 IMPLANT
GLOVE BIO SURGEON STRL SZ7.5 (GLOVE) ×5 IMPLANT
GLOVE BIO SURGEONS STRL SZ 6.5 (GLOVE) ×2
GLOVE BIOGEL PI IND STRL 7.0 (GLOVE) IMPLANT
GLOVE BIOGEL PI IND STRL 8 (GLOVE) ×1 IMPLANT
GLOVE BIOGEL PI INDICATOR 7.0 (GLOVE) ×2
GLOVE BIOGEL PI INDICATOR 8 (GLOVE) ×6
GLOVE ECLIPSE 7.5 STRL STRAW (GLOVE) ×2 IMPLANT
GLOVE SURG SS PI 7.0 STRL IVOR (GLOVE) ×2 IMPLANT
GOWN STRL REUS W/ TWL LRG LVL3 (GOWN DISPOSABLE) ×3 IMPLANT
GOWN STRL REUS W/TWL LRG LVL3 (GOWN DISPOSABLE) ×9
GRAFT GORETEX STRT 4-7X45 (Vascular Products) ×2 IMPLANT
KIT BASIN OR (CUSTOM PROCEDURE TRAY) ×3 IMPLANT
KIT ROOM TURNOVER OR (KITS) ×3 IMPLANT
NS IRRIG 1000ML POUR BTL (IV SOLUTION) ×3 IMPLANT
PACK CV ACCESS (CUSTOM PROCEDURE TRAY) ×3 IMPLANT
PAD ARMBOARD 7.5X6 YLW CONV (MISCELLANEOUS) ×6 IMPLANT
SET MICROPUNCTURE 5F STIFF (MISCELLANEOUS) ×2 IMPLANT
SPONGE GAUZE 2X2 STER 10/PKG (GAUZE/BANDAGES/DRESSINGS) ×2
SPONGE LAP 18X18 X RAY DECT (DISPOSABLE) ×2 IMPLANT
SPONGE SURGIFOAM ABS GEL 100 (HEMOSTASIS) IMPLANT
STOPCOCK 4 WAY LG BORE MALE ST (IV SETS) ×2 IMPLANT
SUT MNCRL AB 4-0 PS2 18 (SUTURE) ×2 IMPLANT
SUT PROLENE 6 0 BV (SUTURE) ×5 IMPLANT
SUT VIC AB 3-0 SH 27 (SUTURE) ×6
SUT VIC AB 3-0 SH 27X BRD (SUTURE) ×1 IMPLANT
SUT VICRYL 4-0 PS2 18IN ABS (SUTURE) ×7 IMPLANT
SYR 30ML LL (SYRINGE) ×2 IMPLANT
TOWEL OR 17X24 6PK STRL BLUE (TOWEL DISPOSABLE) ×3 IMPLANT
TOWEL OR 17X26 10 PK STRL BLUE (TOWEL DISPOSABLE) ×3 IMPLANT
TUBING EXTENTION W/L.L. (IV SETS) ×2 IMPLANT
UNDERPAD 30X30 INCONTINENT (UNDERPADS AND DIAPERS) ×3 IMPLANT
WATER STERILE IRR 1000ML POUR (IV SOLUTION) ×3 IMPLANT

## 2013-09-11 NOTE — H&P (View-Only) (Signed)
[facility]  Patient name: [name] MRN: [id number] DOB: 03/31/1936 Sex: male  REASON FOR VISIT: Bleeding from left arm graft.  HPI: [name] is a 78 y.o. male who is had a left forearm graft for 6 years according to the patient. He dialyzes on Tuesdays Thursdays and Saturdays. He's had an eschar on the lateral aspect of the graft were a while but recently had a bleeding episode from this. There is an area in the graft with an eschar approximately 5 mm in diameter which is been present.  He denies fever or chills.   Past Medical History  Diagnosis Date  . Renal insufficiency   . Diabetes mellitus   . Hypertension    History reviewed. No pertinent family history. SOCIAL HISTORY: History  Substance Use Topics  . Smoking status: Never Smoker   . Smokeless tobacco: Current User  . Alcohol Use: No   Allergies  Allergen Reactions  . Iohexol Swelling and Rash   No current outpatient prescriptions on file.   No current facility-administered medications for this visit.   REVIEW OF SYSTEMS: Arly.Keller[X ] denotes positive finding; [  ] denotes negative finding  CARDIOVASCULAR:  [ ]  chest pain   [ ]  chest pressure   [ ]  palpitations   [ ]  orthopnea   [ ]  dyspnea on exertion   Arly.Keller[X ] claudication   [ ]  rest pain   [ ]  DVT   [ ]  phlebitis PULMONARY:   [ ]  productive cough   [ ]  asthma   [ ]  wheezing NEUROLOGIC:   [ ]  weakness  [ ]  paresthesias  [ ]  aphasia  [ ]  amaurosis  [ ]  dizziness HEMATOLOGIC:   [ ]  bleeding problems   [ ]  clotting disorders MUSCULOSKELETAL:  [ ]  joint pain   [ ]  joint swelling [ ]  leg swelling GASTROINTESTINAL: [ ]   blood in stool  [ ]   hematemesis GENITOURINARY:  [ ]   dysuria  [ ]   hematuria PSYCHIATRIC:  [ ]  history of major depression INTEGUMENTARY:  [ ]  rashes  [ ]  ulcers CONSTITUTIONAL:  [ ]  fever   [ ]  chills  PHYSICAL EXAM: Filed Vitals:   09/09/13 1221  BP: 159/61  Pulse: 86  Resp: 16  Height: 5\' 10"  (1.778 m)  Weight:  230 lb (104.327 kg)   Body mass index is 33 kg/(m^2). GENERAL: The patient is a well-nourished male, in no acute distress. The vital signs are documented above. CARDIOVASCULAR: There is a regular rate and rhythm.  PULMONARY: There is good air exchange bilaterally without wheezing or rales. His left forearm AV graft is pulsatile. The ulnar aspect of the graft is aneurysmal. Along the radial aspect of the graft there is an eschar that measures approximately 5-6 mm in diameter. Currently there is no bleeding. No drainage or erythema currently. NEUROLOGIC: No focal weakness or paresthesias are detected. SKIN: There are no ulcers or rashes noted. PSYCHIATRIC: The patient has a normal affect.  MEDICAL ISSUES: This patient has an eschar over the radial aspect of the graft which will require revision because of the risk for bleeding. He dialyzes on Tuesdays Thursdays and Saturdays I have arranged revision of the graft this Friday. I've explained that if he does develop bleeding from this area to hold digital pressure over the one specific area in call 911. Given that the graft is pulsatile I will also likely shoot a fistulogram at the time of surgery and proceeded disease  amenable to venoplasty this could potentially be done at the same time. The surgery scheduled for 09/11/2013 to  Chuck Hinthristopher S Ryleigh Esqueda [facility] Beeper: [phone]

## 2013-09-11 NOTE — Discharge Instructions (Signed)
° ° °  09/11/2013 [name] [id number] 06/25/1935  Surgeon(s): Chuck Hinthristopher S Dickson, MD  Procedure(s): REVISON OF ARTERIOVENOUS FISTULA-LEFT ARM; POSSIBLE VENOPLASTY  x May stick graft on designated area only:  Do NOT stick radial side of graft for 4 weeks. (see diagram)

## 2013-09-11 NOTE — Interval H&P Note (Signed)
History and Physical Interval Note:  09/11/2013 7:17 AM  [name]  has presented today for surgery, with the diagnosis of End Stage Renal Disease  The various methods of treatment have been discussed with the patient and family. After consideration of risks, benefits and other options for treatment, the patient has consented to  Procedure(s): REVISON OF ARTERIOVENOUS FISTULA-LEFT ARM; POSSIBLE VENOPLASTY (Left) as a surgical intervention .  The patient's history has been reviewed, patient examined, no change in status, stable for surgery.  I have reviewed the patient's chart and labs.  Questions were answered to the patient's satisfaction.     Chuck Hinthristopher S Dickson

## 2013-09-11 NOTE — Op Note (Signed)
    NAME: [name]   MRN: [id number] DOB: 11/30/1935    DATE OF OPERATION: 09/11/2013  PREOP DIAGNOSIS: Bleeding from left forearm AV graft  POSTOP DIAGNOSIS: same  PROCEDURE:  1. Revision of left forearm AV graft (replacement of arterial/radial half of the graft) 2. Intraoperative fistulogram  SURGEON: Di Kindlehristopher S. Edilia Boickson, MD, FACS  ASSIST: Doreatha MassedSamantha Rhyne, PA  ANESTHESIA: local with sedation   EBL: minimal  INDICATIONS: [name] is a 78 y.o. male who had a bleeding episode from his left forearm AV graft along the radial aspect of the graft. He had a large eschar over the area of bleeding. He presents for revision of his graft.  FINDINGS: there was no significant outflow stenosis identified.  TECHNIQUE: Patient was taken to the operating room and sedated by anesthesia. The left upper extremity was prepped and draped in usual sterile fashion. Along the radial aspect of the forearm, an incision was made just below the antecubital area over the graft. At the distal loop of the graft a separate incision was made after the skin was anesthetized also. The graft was dissected free in both areas. A new segment of 7 mm PTFE graft was tunneled between the 2 incisions and the patient was heparinized. The graft was clamped at both ends and then divided at both ends. The new segment of graft was sewn at both ends with running 6-0 Prolene suture. Of note prior to completing the anastomosis I did pass the Fogarty catheter in both directions and there was no retained clot. There was a good thrill in the graft at this point although again it was somewhat pulsatile. Then made an elliptical incision encompassing the area of the eschar and this was excised. Hemostasis was obtained here and this was closed with 4-0 Vicryl. Hemostasis was obtained and the other 2 incisions and the heparin was partially reversed with protamine. These wounds were closed the deep layer 3-0 Vicryl the skin closed with 4-0  Vicryl. Dermabond was applied. Next intraoperative fistulogram was obtained.  After the skin was anesthetized with 1% lidocaine, the venous limb of the graft was cannulated below the antecubital level with the micropuncture needle. A micropuncture sheath was introduced over the wire. Fistulogram was obtained to evaluate the fistula from the cannulation site to the central veins. There was some irregularity in the vein but no critical stenosis identified. There was no central venous stenosis. The cannulation site was closed with 4-0 Monocryl.   At the completion of the procedure, the patient was transferred to recovery in stable condition. All needle and sponge counts were correct.  Waverly Ferrarihristopher Jorgen Wolfinger, MD, FACS [facility]  DATE OF DICTATION:   09/11/2013

## 2013-09-11 NOTE — Anesthesia Procedure Notes (Signed)
Procedure Name: MAC Date/Time: 09/11/2013 7:35 AM Performed by: De NurseENNIE, Rooney Swails E Pre-anesthesia Checklist: Patient identified, Emergency Drugs available, Suction available, Patient being monitored and Timeout performed Patient Re-evaluated:Patient Re-evaluated prior to inductionOxygen Delivery Method: Simple face mask

## 2013-09-11 NOTE — Anesthesia Preprocedure Evaluation (Addendum)
Anesthesia Evaluation  Patient identified by MRN, date of birth, ID band Patient awake    Reviewed: Allergy & Precautions, H&P , NPO status , Patient's Chart, lab work & pertinent test results  Airway Mallampati: II  Neck ROM: full    Dental  (+) Teeth Intact, Dental Advisory Given   Pulmonary          Cardiovascular hypertension, + angina + CAD     Neuro/Psych    GI/Hepatic   Endo/Other  diabetes, Type 2obese  Renal/GU ESRF and DialysisRenal disease     Musculoskeletal  (+) Arthritis -,   Abdominal   Peds  Hematology   Anesthesia Other Findings   Reproductive/Obstetrics                          Anesthesia Physical Anesthesia Plan  ASA: III  Anesthesia Plan: MAC   Post-op Pain Management:    Induction: Intravenous  Airway Management Planned: Simple Face Mask  Additional Equipment:   Intra-op Plan:   Post-operative Plan:   Informed Consent: I have reviewed the patients History and Physical, chart, labs and discussed the procedure including the risks, benefits and alternatives for the proposed anesthesia with the patient or authorized representative who has indicated his/her understanding and acceptance.     Plan Discussed with: CRNA, Anesthesiologist and Surgeon  Anesthesia Plan Comments:         Anesthesia Quick Evaluation

## 2013-09-11 NOTE — Progress Notes (Addendum)
SPOKE WITH DR. HODIERN RE: PATIENT GLUCOSE 63 ON ISTAT AND HE IS ASYMPTOMATIC(DAUGHTER STATES THAT IS NORMAL FOR HIM). ALSO SHOWED CXR DONE ON 07/08/13 TO DR. HODIERN AND HE STATED WAS OKAY.  REPORTED TO CRNA THAT WE WERE UNABLE TO OBTAIN IV ACCESS, GLUCOSE 63 / PATIENT ASYMPTOMATIC.

## 2013-09-11 NOTE — Transfer of Care (Signed)
Immediate Anesthesia Transfer of Care Note  Patient: [name]  Procedure(s) Performed: Procedure(s): REVISON OF ARTERIOVENOUS FISTULA-LEFT ARM; POSSIBLE VENOPLASTY WITH INTEROPERATIVE FISTULOGRAM (Left)  Patient Location: PACU  Anesthesia Type:MAC  Level of Consciousness: awake, alert  and oriented  Airway & Oxygen Therapy: Patient Spontanous Breathing  Post-op Assessment: Report given to PACU RN  Post vital signs: Reviewed and stable  Complications: No apparent anesthesia complications

## 2013-09-11 NOTE — Anesthesia Postprocedure Evaluation (Signed)
Anesthesia Post Note  Patient: [name]  Procedure(s) Performed: Procedure(s) (LRB): REVISON OF ARTERIOVENOUS FISTULA-LEFT ARM; POSSIBLE VENOPLASTY WITH INTEROPERATIVE FISTULOGRAM (Left)  Anesthesia type: MAC  Patient location: PACU  Post pain: Pain level controlled and Adequate analgesia  Post assessment: Post-op Vital signs reviewed, Patient's Cardiovascular Status Stable and Respiratory Function Stable  Last Vitals:  Filed Vitals:   09/11/13 1015  BP: 150/65  Pulse:   Temp:   Resp:     Post vital signs: Reviewed and stable  Level of consciousness: awake, alert  and oriented  Complications: No apparent anesthesia complications

## 2013-09-11 NOTE — Progress Notes (Signed)
Patient CBG was 69 patient states he runs in the 80s normally. Patient states that when he doesn't eat his blood suger drops into the 60's. Patient was given some peanut butter crackers. Patient A&O x3 no distress or symptoms of hypoglycemia. Will recheck in 15 and closely monitor patient.

## 2013-09-15 ENCOUNTER — Encounter (HOSPITAL_COMMUNITY): Payer: Self-pay | Admitting: Vascular Surgery

## 2013-09-16 ENCOUNTER — Ambulatory Visit: Payer: Medicare Other | Admitting: Vascular Surgery

## 2014-05-22 DIAGNOSIS — D509 Iron deficiency anemia, unspecified: Secondary | ICD-10-CM | POA: Diagnosis not present

## 2014-05-22 DIAGNOSIS — N186 End stage renal disease: Secondary | ICD-10-CM | POA: Diagnosis not present

## 2014-05-22 DIAGNOSIS — E1129 Type 2 diabetes mellitus with other diabetic kidney complication: Secondary | ICD-10-CM | POA: Diagnosis not present

## 2014-05-22 DIAGNOSIS — D689 Coagulation defect, unspecified: Secondary | ICD-10-CM | POA: Diagnosis not present

## 2014-05-22 DIAGNOSIS — E119 Type 2 diabetes mellitus without complications: Secondary | ICD-10-CM | POA: Diagnosis not present

## 2014-05-22 DIAGNOSIS — D631 Anemia in chronic kidney disease: Secondary | ICD-10-CM | POA: Diagnosis not present

## 2014-05-25 DIAGNOSIS — D631 Anemia in chronic kidney disease: Secondary | ICD-10-CM | POA: Diagnosis not present

## 2014-05-25 DIAGNOSIS — E1129 Type 2 diabetes mellitus with other diabetic kidney complication: Secondary | ICD-10-CM | POA: Diagnosis not present

## 2014-05-25 DIAGNOSIS — N186 End stage renal disease: Secondary | ICD-10-CM | POA: Diagnosis not present

## 2014-05-25 DIAGNOSIS — D689 Coagulation defect, unspecified: Secondary | ICD-10-CM | POA: Diagnosis not present

## 2014-05-25 DIAGNOSIS — E119 Type 2 diabetes mellitus without complications: Secondary | ICD-10-CM | POA: Diagnosis not present

## 2014-05-25 DIAGNOSIS — D509 Iron deficiency anemia, unspecified: Secondary | ICD-10-CM | POA: Diagnosis not present

## 2014-05-27 DIAGNOSIS — D631 Anemia in chronic kidney disease: Secondary | ICD-10-CM | POA: Diagnosis not present

## 2014-05-27 DIAGNOSIS — E1129 Type 2 diabetes mellitus with other diabetic kidney complication: Secondary | ICD-10-CM | POA: Diagnosis not present

## 2014-05-27 DIAGNOSIS — E119 Type 2 diabetes mellitus without complications: Secondary | ICD-10-CM | POA: Diagnosis not present

## 2014-05-27 DIAGNOSIS — D509 Iron deficiency anemia, unspecified: Secondary | ICD-10-CM | POA: Diagnosis not present

## 2014-05-27 DIAGNOSIS — N186 End stage renal disease: Secondary | ICD-10-CM | POA: Diagnosis not present

## 2014-05-27 DIAGNOSIS — D689 Coagulation defect, unspecified: Secondary | ICD-10-CM | POA: Diagnosis not present

## 2014-05-29 DIAGNOSIS — D689 Coagulation defect, unspecified: Secondary | ICD-10-CM | POA: Diagnosis not present

## 2014-05-29 DIAGNOSIS — E119 Type 2 diabetes mellitus without complications: Secondary | ICD-10-CM | POA: Diagnosis not present

## 2014-05-29 DIAGNOSIS — D631 Anemia in chronic kidney disease: Secondary | ICD-10-CM | POA: Diagnosis not present

## 2014-05-29 DIAGNOSIS — D509 Iron deficiency anemia, unspecified: Secondary | ICD-10-CM | POA: Diagnosis not present

## 2014-05-29 DIAGNOSIS — N186 End stage renal disease: Secondary | ICD-10-CM | POA: Diagnosis not present

## 2014-05-29 DIAGNOSIS — E1129 Type 2 diabetes mellitus with other diabetic kidney complication: Secondary | ICD-10-CM | POA: Diagnosis not present

## 2014-05-31 DIAGNOSIS — J310 Chronic rhinitis: Secondary | ICD-10-CM | POA: Diagnosis not present

## 2014-05-31 DIAGNOSIS — M549 Dorsalgia, unspecified: Secondary | ICD-10-CM | POA: Diagnosis not present

## 2014-06-01 DIAGNOSIS — D631 Anemia in chronic kidney disease: Secondary | ICD-10-CM | POA: Diagnosis not present

## 2014-06-01 DIAGNOSIS — R531 Weakness: Secondary | ICD-10-CM | POA: Diagnosis not present

## 2014-06-01 DIAGNOSIS — N186 End stage renal disease: Secondary | ICD-10-CM | POA: Diagnosis not present

## 2014-06-01 DIAGNOSIS — E1129 Type 2 diabetes mellitus with other diabetic kidney complication: Secondary | ICD-10-CM | POA: Diagnosis not present

## 2014-06-01 DIAGNOSIS — J209 Acute bronchitis, unspecified: Secondary | ICD-10-CM | POA: Diagnosis not present

## 2014-06-01 DIAGNOSIS — D509 Iron deficiency anemia, unspecified: Secondary | ICD-10-CM | POA: Diagnosis not present

## 2014-06-01 DIAGNOSIS — D689 Coagulation defect, unspecified: Secondary | ICD-10-CM | POA: Diagnosis not present

## 2014-06-01 DIAGNOSIS — E119 Type 2 diabetes mellitus without complications: Secondary | ICD-10-CM | POA: Diagnosis not present

## 2014-06-03 DIAGNOSIS — E1129 Type 2 diabetes mellitus with other diabetic kidney complication: Secondary | ICD-10-CM | POA: Diagnosis not present

## 2014-06-03 DIAGNOSIS — D689 Coagulation defect, unspecified: Secondary | ICD-10-CM | POA: Diagnosis not present

## 2014-06-03 DIAGNOSIS — D631 Anemia in chronic kidney disease: Secondary | ICD-10-CM | POA: Diagnosis not present

## 2014-06-03 DIAGNOSIS — D509 Iron deficiency anemia, unspecified: Secondary | ICD-10-CM | POA: Diagnosis not present

## 2014-06-03 DIAGNOSIS — N186 End stage renal disease: Secondary | ICD-10-CM | POA: Diagnosis not present

## 2014-06-03 DIAGNOSIS — E119 Type 2 diabetes mellitus without complications: Secondary | ICD-10-CM | POA: Diagnosis not present

## 2014-06-05 DIAGNOSIS — D689 Coagulation defect, unspecified: Secondary | ICD-10-CM | POA: Diagnosis not present

## 2014-06-05 DIAGNOSIS — E1129 Type 2 diabetes mellitus with other diabetic kidney complication: Secondary | ICD-10-CM | POA: Diagnosis not present

## 2014-06-05 DIAGNOSIS — D631 Anemia in chronic kidney disease: Secondary | ICD-10-CM | POA: Diagnosis not present

## 2014-06-05 DIAGNOSIS — D509 Iron deficiency anemia, unspecified: Secondary | ICD-10-CM | POA: Diagnosis not present

## 2014-06-05 DIAGNOSIS — E119 Type 2 diabetes mellitus without complications: Secondary | ICD-10-CM | POA: Diagnosis not present

## 2014-06-05 DIAGNOSIS — N186 End stage renal disease: Secondary | ICD-10-CM | POA: Diagnosis not present

## 2014-06-07 DIAGNOSIS — N186 End stage renal disease: Secondary | ICD-10-CM | POA: Diagnosis not present

## 2014-06-07 DIAGNOSIS — E119 Type 2 diabetes mellitus without complications: Secondary | ICD-10-CM | POA: Diagnosis not present

## 2014-06-07 DIAGNOSIS — D689 Coagulation defect, unspecified: Secondary | ICD-10-CM | POA: Diagnosis not present

## 2014-06-07 DIAGNOSIS — E1129 Type 2 diabetes mellitus with other diabetic kidney complication: Secondary | ICD-10-CM | POA: Diagnosis not present

## 2014-06-07 DIAGNOSIS — D631 Anemia in chronic kidney disease: Secondary | ICD-10-CM | POA: Diagnosis not present

## 2014-06-07 DIAGNOSIS — D509 Iron deficiency anemia, unspecified: Secondary | ICD-10-CM | POA: Diagnosis not present

## 2014-06-08 DIAGNOSIS — E119 Type 2 diabetes mellitus without complications: Secondary | ICD-10-CM | POA: Diagnosis not present

## 2014-06-08 DIAGNOSIS — E1129 Type 2 diabetes mellitus with other diabetic kidney complication: Secondary | ICD-10-CM | POA: Diagnosis not present

## 2014-06-08 DIAGNOSIS — N186 End stage renal disease: Secondary | ICD-10-CM | POA: Diagnosis not present

## 2014-06-08 DIAGNOSIS — D509 Iron deficiency anemia, unspecified: Secondary | ICD-10-CM | POA: Diagnosis not present

## 2014-06-08 DIAGNOSIS — D631 Anemia in chronic kidney disease: Secondary | ICD-10-CM | POA: Diagnosis not present

## 2014-06-08 DIAGNOSIS — D689 Coagulation defect, unspecified: Secondary | ICD-10-CM | POA: Diagnosis not present

## 2014-06-10 DIAGNOSIS — E119 Type 2 diabetes mellitus without complications: Secondary | ICD-10-CM | POA: Diagnosis not present

## 2014-06-10 DIAGNOSIS — E1129 Type 2 diabetes mellitus with other diabetic kidney complication: Secondary | ICD-10-CM | POA: Diagnosis not present

## 2014-06-10 DIAGNOSIS — D631 Anemia in chronic kidney disease: Secondary | ICD-10-CM | POA: Diagnosis not present

## 2014-06-10 DIAGNOSIS — D509 Iron deficiency anemia, unspecified: Secondary | ICD-10-CM | POA: Diagnosis not present

## 2014-06-10 DIAGNOSIS — D689 Coagulation defect, unspecified: Secondary | ICD-10-CM | POA: Diagnosis not present

## 2014-06-10 DIAGNOSIS — N186 End stage renal disease: Secondary | ICD-10-CM | POA: Diagnosis not present

## 2014-06-15 DIAGNOSIS — N186 End stage renal disease: Secondary | ICD-10-CM | POA: Diagnosis not present

## 2014-06-15 DIAGNOSIS — D689 Coagulation defect, unspecified: Secondary | ICD-10-CM | POA: Diagnosis not present

## 2014-06-15 DIAGNOSIS — D509 Iron deficiency anemia, unspecified: Secondary | ICD-10-CM | POA: Diagnosis not present

## 2014-06-15 DIAGNOSIS — E1129 Type 2 diabetes mellitus with other diabetic kidney complication: Secondary | ICD-10-CM | POA: Diagnosis not present

## 2014-06-15 DIAGNOSIS — E119 Type 2 diabetes mellitus without complications: Secondary | ICD-10-CM | POA: Diagnosis not present

## 2014-06-15 DIAGNOSIS — D631 Anemia in chronic kidney disease: Secondary | ICD-10-CM | POA: Diagnosis not present

## 2014-06-17 DIAGNOSIS — D689 Coagulation defect, unspecified: Secondary | ICD-10-CM | POA: Diagnosis not present

## 2014-06-17 DIAGNOSIS — D509 Iron deficiency anemia, unspecified: Secondary | ICD-10-CM | POA: Diagnosis not present

## 2014-06-17 DIAGNOSIS — D631 Anemia in chronic kidney disease: Secondary | ICD-10-CM | POA: Diagnosis not present

## 2014-06-17 DIAGNOSIS — E1129 Type 2 diabetes mellitus with other diabetic kidney complication: Secondary | ICD-10-CM | POA: Diagnosis not present

## 2014-06-17 DIAGNOSIS — E11331 Type 2 diabetes mellitus with moderate nonproliferative diabetic retinopathy with macular edema: Secondary | ICD-10-CM | POA: Diagnosis not present

## 2014-06-17 DIAGNOSIS — N186 End stage renal disease: Secondary | ICD-10-CM | POA: Diagnosis not present

## 2014-06-17 DIAGNOSIS — E119 Type 2 diabetes mellitus without complications: Secondary | ICD-10-CM | POA: Diagnosis not present

## 2014-06-19 DIAGNOSIS — E1129 Type 2 diabetes mellitus with other diabetic kidney complication: Secondary | ICD-10-CM | POA: Diagnosis not present

## 2014-06-19 DIAGNOSIS — D689 Coagulation defect, unspecified: Secondary | ICD-10-CM | POA: Diagnosis not present

## 2014-06-19 DIAGNOSIS — N186 End stage renal disease: Secondary | ICD-10-CM | POA: Diagnosis not present

## 2014-06-19 DIAGNOSIS — E119 Type 2 diabetes mellitus without complications: Secondary | ICD-10-CM | POA: Diagnosis not present

## 2014-06-19 DIAGNOSIS — D509 Iron deficiency anemia, unspecified: Secondary | ICD-10-CM | POA: Diagnosis not present

## 2014-06-19 DIAGNOSIS — D631 Anemia in chronic kidney disease: Secondary | ICD-10-CM | POA: Diagnosis not present

## 2014-06-20 DIAGNOSIS — N186 End stage renal disease: Secondary | ICD-10-CM | POA: Diagnosis not present

## 2014-06-20 DIAGNOSIS — Z992 Dependence on renal dialysis: Secondary | ICD-10-CM | POA: Diagnosis not present

## 2014-06-22 DIAGNOSIS — E119 Type 2 diabetes mellitus without complications: Secondary | ICD-10-CM | POA: Diagnosis not present

## 2014-06-22 DIAGNOSIS — N186 End stage renal disease: Secondary | ICD-10-CM | POA: Diagnosis not present

## 2014-06-22 DIAGNOSIS — D509 Iron deficiency anemia, unspecified: Secondary | ICD-10-CM | POA: Diagnosis not present

## 2014-06-22 DIAGNOSIS — D631 Anemia in chronic kidney disease: Secondary | ICD-10-CM | POA: Diagnosis not present

## 2014-06-22 DIAGNOSIS — D689 Coagulation defect, unspecified: Secondary | ICD-10-CM | POA: Diagnosis not present

## 2014-06-22 DIAGNOSIS — E1129 Type 2 diabetes mellitus with other diabetic kidney complication: Secondary | ICD-10-CM | POA: Diagnosis not present

## 2014-06-23 DIAGNOSIS — R05 Cough: Secondary | ICD-10-CM | POA: Diagnosis not present

## 2014-06-23 DIAGNOSIS — I10 Essential (primary) hypertension: Secondary | ICD-10-CM | POA: Diagnosis not present

## 2014-06-24 DIAGNOSIS — E119 Type 2 diabetes mellitus without complications: Secondary | ICD-10-CM | POA: Diagnosis not present

## 2014-06-24 DIAGNOSIS — D631 Anemia in chronic kidney disease: Secondary | ICD-10-CM | POA: Diagnosis not present

## 2014-06-24 DIAGNOSIS — E1129 Type 2 diabetes mellitus with other diabetic kidney complication: Secondary | ICD-10-CM | POA: Diagnosis not present

## 2014-06-24 DIAGNOSIS — N186 End stage renal disease: Secondary | ICD-10-CM | POA: Diagnosis not present

## 2014-06-24 DIAGNOSIS — D689 Coagulation defect, unspecified: Secondary | ICD-10-CM | POA: Diagnosis not present

## 2014-06-24 DIAGNOSIS — D509 Iron deficiency anemia, unspecified: Secondary | ICD-10-CM | POA: Diagnosis not present

## 2014-06-26 DIAGNOSIS — D509 Iron deficiency anemia, unspecified: Secondary | ICD-10-CM | POA: Diagnosis not present

## 2014-06-26 DIAGNOSIS — D689 Coagulation defect, unspecified: Secondary | ICD-10-CM | POA: Diagnosis not present

## 2014-06-26 DIAGNOSIS — D631 Anemia in chronic kidney disease: Secondary | ICD-10-CM | POA: Diagnosis not present

## 2014-06-26 DIAGNOSIS — E119 Type 2 diabetes mellitus without complications: Secondary | ICD-10-CM | POA: Diagnosis not present

## 2014-06-26 DIAGNOSIS — N186 End stage renal disease: Secondary | ICD-10-CM | POA: Diagnosis not present

## 2014-06-26 DIAGNOSIS — E1129 Type 2 diabetes mellitus with other diabetic kidney complication: Secondary | ICD-10-CM | POA: Diagnosis not present

## 2014-06-29 DIAGNOSIS — D631 Anemia in chronic kidney disease: Secondary | ICD-10-CM | POA: Diagnosis not present

## 2014-06-29 DIAGNOSIS — E119 Type 2 diabetes mellitus without complications: Secondary | ICD-10-CM | POA: Diagnosis not present

## 2014-06-29 DIAGNOSIS — D689 Coagulation defect, unspecified: Secondary | ICD-10-CM | POA: Diagnosis not present

## 2014-06-29 DIAGNOSIS — D509 Iron deficiency anemia, unspecified: Secondary | ICD-10-CM | POA: Diagnosis not present

## 2014-06-29 DIAGNOSIS — E1129 Type 2 diabetes mellitus with other diabetic kidney complication: Secondary | ICD-10-CM | POA: Diagnosis not present

## 2014-06-29 DIAGNOSIS — N186 End stage renal disease: Secondary | ICD-10-CM | POA: Diagnosis not present

## 2014-07-01 DIAGNOSIS — E1129 Type 2 diabetes mellitus with other diabetic kidney complication: Secondary | ICD-10-CM | POA: Diagnosis not present

## 2014-07-01 DIAGNOSIS — E119 Type 2 diabetes mellitus without complications: Secondary | ICD-10-CM | POA: Diagnosis not present

## 2014-07-01 DIAGNOSIS — D631 Anemia in chronic kidney disease: Secondary | ICD-10-CM | POA: Diagnosis not present

## 2014-07-01 DIAGNOSIS — N186 End stage renal disease: Secondary | ICD-10-CM | POA: Diagnosis not present

## 2014-07-01 DIAGNOSIS — D689 Coagulation defect, unspecified: Secondary | ICD-10-CM | POA: Diagnosis not present

## 2014-07-01 DIAGNOSIS — D509 Iron deficiency anemia, unspecified: Secondary | ICD-10-CM | POA: Diagnosis not present

## 2014-07-03 DIAGNOSIS — E1129 Type 2 diabetes mellitus with other diabetic kidney complication: Secondary | ICD-10-CM | POA: Diagnosis not present

## 2014-07-03 DIAGNOSIS — D631 Anemia in chronic kidney disease: Secondary | ICD-10-CM | POA: Diagnosis not present

## 2014-07-03 DIAGNOSIS — D689 Coagulation defect, unspecified: Secondary | ICD-10-CM | POA: Diagnosis not present

## 2014-07-03 DIAGNOSIS — D509 Iron deficiency anemia, unspecified: Secondary | ICD-10-CM | POA: Diagnosis not present

## 2014-07-03 DIAGNOSIS — E119 Type 2 diabetes mellitus without complications: Secondary | ICD-10-CM | POA: Diagnosis not present

## 2014-07-03 DIAGNOSIS — N186 End stage renal disease: Secondary | ICD-10-CM | POA: Diagnosis not present

## 2014-07-06 DIAGNOSIS — E1129 Type 2 diabetes mellitus with other diabetic kidney complication: Secondary | ICD-10-CM | POA: Diagnosis not present

## 2014-07-06 DIAGNOSIS — N186 End stage renal disease: Secondary | ICD-10-CM | POA: Diagnosis not present

## 2014-07-06 DIAGNOSIS — D509 Iron deficiency anemia, unspecified: Secondary | ICD-10-CM | POA: Diagnosis not present

## 2014-07-06 DIAGNOSIS — D689 Coagulation defect, unspecified: Secondary | ICD-10-CM | POA: Diagnosis not present

## 2014-07-06 DIAGNOSIS — E119 Type 2 diabetes mellitus without complications: Secondary | ICD-10-CM | POA: Diagnosis not present

## 2014-07-06 DIAGNOSIS — D631 Anemia in chronic kidney disease: Secondary | ICD-10-CM | POA: Diagnosis not present

## 2014-07-08 DIAGNOSIS — E119 Type 2 diabetes mellitus without complications: Secondary | ICD-10-CM | POA: Diagnosis not present

## 2014-07-08 DIAGNOSIS — D631 Anemia in chronic kidney disease: Secondary | ICD-10-CM | POA: Diagnosis not present

## 2014-07-08 DIAGNOSIS — N186 End stage renal disease: Secondary | ICD-10-CM | POA: Diagnosis not present

## 2014-07-08 DIAGNOSIS — D509 Iron deficiency anemia, unspecified: Secondary | ICD-10-CM | POA: Diagnosis not present

## 2014-07-08 DIAGNOSIS — D689 Coagulation defect, unspecified: Secondary | ICD-10-CM | POA: Diagnosis not present

## 2014-07-08 DIAGNOSIS — E1129 Type 2 diabetes mellitus with other diabetic kidney complication: Secondary | ICD-10-CM | POA: Diagnosis not present

## 2014-07-10 DIAGNOSIS — N186 End stage renal disease: Secondary | ICD-10-CM | POA: Diagnosis not present

## 2014-07-10 DIAGNOSIS — E119 Type 2 diabetes mellitus without complications: Secondary | ICD-10-CM | POA: Diagnosis not present

## 2014-07-10 DIAGNOSIS — D631 Anemia in chronic kidney disease: Secondary | ICD-10-CM | POA: Diagnosis not present

## 2014-07-10 DIAGNOSIS — E1129 Type 2 diabetes mellitus with other diabetic kidney complication: Secondary | ICD-10-CM | POA: Diagnosis not present

## 2014-07-10 DIAGNOSIS — D509 Iron deficiency anemia, unspecified: Secondary | ICD-10-CM | POA: Diagnosis not present

## 2014-07-10 DIAGNOSIS — D689 Coagulation defect, unspecified: Secondary | ICD-10-CM | POA: Diagnosis not present

## 2014-07-13 DIAGNOSIS — D689 Coagulation defect, unspecified: Secondary | ICD-10-CM | POA: Diagnosis not present

## 2014-07-13 DIAGNOSIS — D631 Anemia in chronic kidney disease: Secondary | ICD-10-CM | POA: Diagnosis not present

## 2014-07-13 DIAGNOSIS — E119 Type 2 diabetes mellitus without complications: Secondary | ICD-10-CM | POA: Diagnosis not present

## 2014-07-13 DIAGNOSIS — N186 End stage renal disease: Secondary | ICD-10-CM | POA: Diagnosis not present

## 2014-07-13 DIAGNOSIS — E1129 Type 2 diabetes mellitus with other diabetic kidney complication: Secondary | ICD-10-CM | POA: Diagnosis not present

## 2014-07-13 DIAGNOSIS — D509 Iron deficiency anemia, unspecified: Secondary | ICD-10-CM | POA: Diagnosis not present

## 2014-07-15 DIAGNOSIS — D631 Anemia in chronic kidney disease: Secondary | ICD-10-CM | POA: Diagnosis not present

## 2014-07-15 DIAGNOSIS — D689 Coagulation defect, unspecified: Secondary | ICD-10-CM | POA: Diagnosis not present

## 2014-07-15 DIAGNOSIS — D509 Iron deficiency anemia, unspecified: Secondary | ICD-10-CM | POA: Diagnosis not present

## 2014-07-15 DIAGNOSIS — E1129 Type 2 diabetes mellitus with other diabetic kidney complication: Secondary | ICD-10-CM | POA: Diagnosis not present

## 2014-07-15 DIAGNOSIS — E119 Type 2 diabetes mellitus without complications: Secondary | ICD-10-CM | POA: Diagnosis not present

## 2014-07-15 DIAGNOSIS — N186 End stage renal disease: Secondary | ICD-10-CM | POA: Diagnosis not present

## 2014-07-17 DIAGNOSIS — E1129 Type 2 diabetes mellitus with other diabetic kidney complication: Secondary | ICD-10-CM | POA: Diagnosis not present

## 2014-07-17 DIAGNOSIS — N186 End stage renal disease: Secondary | ICD-10-CM | POA: Diagnosis not present

## 2014-07-17 DIAGNOSIS — D689 Coagulation defect, unspecified: Secondary | ICD-10-CM | POA: Diagnosis not present

## 2014-07-17 DIAGNOSIS — E119 Type 2 diabetes mellitus without complications: Secondary | ICD-10-CM | POA: Diagnosis not present

## 2014-07-17 DIAGNOSIS — D631 Anemia in chronic kidney disease: Secondary | ICD-10-CM | POA: Diagnosis not present

## 2014-07-17 DIAGNOSIS — D509 Iron deficiency anemia, unspecified: Secondary | ICD-10-CM | POA: Diagnosis not present

## 2014-07-19 DIAGNOSIS — N186 End stage renal disease: Secondary | ICD-10-CM | POA: Diagnosis not present

## 2014-07-19 DIAGNOSIS — Z992 Dependence on renal dialysis: Secondary | ICD-10-CM | POA: Diagnosis not present

## 2014-07-20 DIAGNOSIS — N186 End stage renal disease: Secondary | ICD-10-CM | POA: Diagnosis not present

## 2014-07-20 DIAGNOSIS — E119 Type 2 diabetes mellitus without complications: Secondary | ICD-10-CM | POA: Diagnosis not present

## 2014-07-20 DIAGNOSIS — E1129 Type 2 diabetes mellitus with other diabetic kidney complication: Secondary | ICD-10-CM | POA: Diagnosis not present

## 2014-07-20 DIAGNOSIS — D689 Coagulation defect, unspecified: Secondary | ICD-10-CM | POA: Diagnosis not present

## 2014-07-20 DIAGNOSIS — D509 Iron deficiency anemia, unspecified: Secondary | ICD-10-CM | POA: Diagnosis not present

## 2014-07-20 DIAGNOSIS — D631 Anemia in chronic kidney disease: Secondary | ICD-10-CM | POA: Diagnosis not present

## 2014-07-22 DIAGNOSIS — E11331 Type 2 diabetes mellitus with moderate nonproliferative diabetic retinopathy with macular edema: Secondary | ICD-10-CM | POA: Diagnosis not present

## 2014-07-22 DIAGNOSIS — N186 End stage renal disease: Secondary | ICD-10-CM | POA: Diagnosis not present

## 2014-07-22 DIAGNOSIS — E119 Type 2 diabetes mellitus without complications: Secondary | ICD-10-CM | POA: Diagnosis not present

## 2014-07-22 DIAGNOSIS — D631 Anemia in chronic kidney disease: Secondary | ICD-10-CM | POA: Diagnosis not present

## 2014-07-22 DIAGNOSIS — E11339 Type 2 diabetes mellitus with moderate nonproliferative diabetic retinopathy without macular edema: Secondary | ICD-10-CM | POA: Diagnosis not present

## 2014-07-22 DIAGNOSIS — E1129 Type 2 diabetes mellitus with other diabetic kidney complication: Secondary | ICD-10-CM | POA: Diagnosis not present

## 2014-07-22 DIAGNOSIS — D689 Coagulation defect, unspecified: Secondary | ICD-10-CM | POA: Diagnosis not present

## 2014-07-22 DIAGNOSIS — D509 Iron deficiency anemia, unspecified: Secondary | ICD-10-CM | POA: Diagnosis not present

## 2014-07-24 DIAGNOSIS — N186 End stage renal disease: Secondary | ICD-10-CM | POA: Diagnosis not present

## 2014-07-24 DIAGNOSIS — D631 Anemia in chronic kidney disease: Secondary | ICD-10-CM | POA: Diagnosis not present

## 2014-07-24 DIAGNOSIS — E1129 Type 2 diabetes mellitus with other diabetic kidney complication: Secondary | ICD-10-CM | POA: Diagnosis not present

## 2014-07-24 DIAGNOSIS — D689 Coagulation defect, unspecified: Secondary | ICD-10-CM | POA: Diagnosis not present

## 2014-07-24 DIAGNOSIS — D509 Iron deficiency anemia, unspecified: Secondary | ICD-10-CM | POA: Diagnosis not present

## 2014-07-24 DIAGNOSIS — E119 Type 2 diabetes mellitus without complications: Secondary | ICD-10-CM | POA: Diagnosis not present

## 2014-07-27 DIAGNOSIS — N186 End stage renal disease: Secondary | ICD-10-CM | POA: Diagnosis not present

## 2014-07-27 DIAGNOSIS — E1129 Type 2 diabetes mellitus with other diabetic kidney complication: Secondary | ICD-10-CM | POA: Diagnosis not present

## 2014-07-27 DIAGNOSIS — D509 Iron deficiency anemia, unspecified: Secondary | ICD-10-CM | POA: Diagnosis not present

## 2014-07-27 DIAGNOSIS — D689 Coagulation defect, unspecified: Secondary | ICD-10-CM | POA: Diagnosis not present

## 2014-07-27 DIAGNOSIS — E119 Type 2 diabetes mellitus without complications: Secondary | ICD-10-CM | POA: Diagnosis not present

## 2014-07-27 DIAGNOSIS — D631 Anemia in chronic kidney disease: Secondary | ICD-10-CM | POA: Diagnosis not present

## 2014-07-28 DIAGNOSIS — I10 Essential (primary) hypertension: Secondary | ICD-10-CM | POA: Diagnosis not present

## 2014-07-28 DIAGNOSIS — R600 Localized edema: Secondary | ICD-10-CM | POA: Diagnosis not present

## 2014-07-28 DIAGNOSIS — M549 Dorsalgia, unspecified: Secondary | ICD-10-CM | POA: Diagnosis not present

## 2014-07-29 DIAGNOSIS — D631 Anemia in chronic kidney disease: Secondary | ICD-10-CM | POA: Diagnosis not present

## 2014-07-29 DIAGNOSIS — D689 Coagulation defect, unspecified: Secondary | ICD-10-CM | POA: Diagnosis not present

## 2014-07-29 DIAGNOSIS — D509 Iron deficiency anemia, unspecified: Secondary | ICD-10-CM | POA: Diagnosis not present

## 2014-07-29 DIAGNOSIS — E119 Type 2 diabetes mellitus without complications: Secondary | ICD-10-CM | POA: Diagnosis not present

## 2014-07-29 DIAGNOSIS — E1129 Type 2 diabetes mellitus with other diabetic kidney complication: Secondary | ICD-10-CM | POA: Diagnosis not present

## 2014-07-29 DIAGNOSIS — N186 End stage renal disease: Secondary | ICD-10-CM | POA: Diagnosis not present

## 2014-07-31 DIAGNOSIS — D631 Anemia in chronic kidney disease: Secondary | ICD-10-CM | POA: Diagnosis not present

## 2014-07-31 DIAGNOSIS — D509 Iron deficiency anemia, unspecified: Secondary | ICD-10-CM | POA: Diagnosis not present

## 2014-07-31 DIAGNOSIS — E119 Type 2 diabetes mellitus without complications: Secondary | ICD-10-CM | POA: Diagnosis not present

## 2014-07-31 DIAGNOSIS — E1129 Type 2 diabetes mellitus with other diabetic kidney complication: Secondary | ICD-10-CM | POA: Diagnosis not present

## 2014-07-31 DIAGNOSIS — N186 End stage renal disease: Secondary | ICD-10-CM | POA: Diagnosis not present

## 2014-07-31 DIAGNOSIS — D689 Coagulation defect, unspecified: Secondary | ICD-10-CM | POA: Diagnosis not present

## 2014-08-03 DIAGNOSIS — D689 Coagulation defect, unspecified: Secondary | ICD-10-CM | POA: Diagnosis not present

## 2014-08-03 DIAGNOSIS — N186 End stage renal disease: Secondary | ICD-10-CM | POA: Diagnosis not present

## 2014-08-03 DIAGNOSIS — D631 Anemia in chronic kidney disease: Secondary | ICD-10-CM | POA: Diagnosis not present

## 2014-08-03 DIAGNOSIS — D509 Iron deficiency anemia, unspecified: Secondary | ICD-10-CM | POA: Diagnosis not present

## 2014-08-03 DIAGNOSIS — E1129 Type 2 diabetes mellitus with other diabetic kidney complication: Secondary | ICD-10-CM | POA: Diagnosis not present

## 2014-08-03 DIAGNOSIS — E119 Type 2 diabetes mellitus without complications: Secondary | ICD-10-CM | POA: Diagnosis not present

## 2014-08-05 DIAGNOSIS — E119 Type 2 diabetes mellitus without complications: Secondary | ICD-10-CM | POA: Diagnosis not present

## 2014-08-05 DIAGNOSIS — D509 Iron deficiency anemia, unspecified: Secondary | ICD-10-CM | POA: Diagnosis not present

## 2014-08-05 DIAGNOSIS — D631 Anemia in chronic kidney disease: Secondary | ICD-10-CM | POA: Diagnosis not present

## 2014-08-05 DIAGNOSIS — N186 End stage renal disease: Secondary | ICD-10-CM | POA: Diagnosis not present

## 2014-08-05 DIAGNOSIS — D689 Coagulation defect, unspecified: Secondary | ICD-10-CM | POA: Diagnosis not present

## 2014-08-05 DIAGNOSIS — E1129 Type 2 diabetes mellitus with other diabetic kidney complication: Secondary | ICD-10-CM | POA: Diagnosis not present

## 2014-08-07 DIAGNOSIS — D631 Anemia in chronic kidney disease: Secondary | ICD-10-CM | POA: Diagnosis not present

## 2014-08-07 DIAGNOSIS — N186 End stage renal disease: Secondary | ICD-10-CM | POA: Diagnosis not present

## 2014-08-07 DIAGNOSIS — D689 Coagulation defect, unspecified: Secondary | ICD-10-CM | POA: Diagnosis not present

## 2014-08-07 DIAGNOSIS — D509 Iron deficiency anemia, unspecified: Secondary | ICD-10-CM | POA: Diagnosis not present

## 2014-08-07 DIAGNOSIS — E119 Type 2 diabetes mellitus without complications: Secondary | ICD-10-CM | POA: Diagnosis not present

## 2014-08-07 DIAGNOSIS — E1129 Type 2 diabetes mellitus with other diabetic kidney complication: Secondary | ICD-10-CM | POA: Diagnosis not present

## 2014-08-10 DIAGNOSIS — N186 End stage renal disease: Secondary | ICD-10-CM | POA: Diagnosis not present

## 2014-08-10 DIAGNOSIS — E1129 Type 2 diabetes mellitus with other diabetic kidney complication: Secondary | ICD-10-CM | POA: Diagnosis not present

## 2014-08-10 DIAGNOSIS — D689 Coagulation defect, unspecified: Secondary | ICD-10-CM | POA: Diagnosis not present

## 2014-08-10 DIAGNOSIS — E119 Type 2 diabetes mellitus without complications: Secondary | ICD-10-CM | POA: Diagnosis not present

## 2014-08-10 DIAGNOSIS — D631 Anemia in chronic kidney disease: Secondary | ICD-10-CM | POA: Diagnosis not present

## 2014-08-10 DIAGNOSIS — D509 Iron deficiency anemia, unspecified: Secondary | ICD-10-CM | POA: Diagnosis not present

## 2014-08-12 DIAGNOSIS — D509 Iron deficiency anemia, unspecified: Secondary | ICD-10-CM | POA: Diagnosis not present

## 2014-08-12 DIAGNOSIS — E1129 Type 2 diabetes mellitus with other diabetic kidney complication: Secondary | ICD-10-CM | POA: Diagnosis not present

## 2014-08-12 DIAGNOSIS — D631 Anemia in chronic kidney disease: Secondary | ICD-10-CM | POA: Diagnosis not present

## 2014-08-12 DIAGNOSIS — D689 Coagulation defect, unspecified: Secondary | ICD-10-CM | POA: Diagnosis not present

## 2014-08-12 DIAGNOSIS — N186 End stage renal disease: Secondary | ICD-10-CM | POA: Diagnosis not present

## 2014-08-12 DIAGNOSIS — E119 Type 2 diabetes mellitus without complications: Secondary | ICD-10-CM | POA: Diagnosis not present

## 2014-08-14 DIAGNOSIS — E119 Type 2 diabetes mellitus without complications: Secondary | ICD-10-CM | POA: Diagnosis not present

## 2014-08-14 DIAGNOSIS — D509 Iron deficiency anemia, unspecified: Secondary | ICD-10-CM | POA: Diagnosis not present

## 2014-08-14 DIAGNOSIS — D689 Coagulation defect, unspecified: Secondary | ICD-10-CM | POA: Diagnosis not present

## 2014-08-14 DIAGNOSIS — N186 End stage renal disease: Secondary | ICD-10-CM | POA: Diagnosis not present

## 2014-08-14 DIAGNOSIS — D631 Anemia in chronic kidney disease: Secondary | ICD-10-CM | POA: Diagnosis not present

## 2014-08-14 DIAGNOSIS — E1129 Type 2 diabetes mellitus with other diabetic kidney complication: Secondary | ICD-10-CM | POA: Diagnosis not present

## 2014-08-17 DIAGNOSIS — N186 End stage renal disease: Secondary | ICD-10-CM | POA: Diagnosis not present

## 2014-08-17 DIAGNOSIS — D689 Coagulation defect, unspecified: Secondary | ICD-10-CM | POA: Diagnosis not present

## 2014-08-17 DIAGNOSIS — D509 Iron deficiency anemia, unspecified: Secondary | ICD-10-CM | POA: Diagnosis not present

## 2014-08-17 DIAGNOSIS — E1129 Type 2 diabetes mellitus with other diabetic kidney complication: Secondary | ICD-10-CM | POA: Diagnosis not present

## 2014-08-17 DIAGNOSIS — D631 Anemia in chronic kidney disease: Secondary | ICD-10-CM | POA: Diagnosis not present

## 2014-08-17 DIAGNOSIS — E119 Type 2 diabetes mellitus without complications: Secondary | ICD-10-CM | POA: Diagnosis not present

## 2014-08-19 DIAGNOSIS — Z992 Dependence on renal dialysis: Secondary | ICD-10-CM | POA: Diagnosis not present

## 2014-08-19 DIAGNOSIS — N186 End stage renal disease: Secondary | ICD-10-CM | POA: Diagnosis not present

## 2014-08-19 DIAGNOSIS — E1129 Type 2 diabetes mellitus with other diabetic kidney complication: Secondary | ICD-10-CM | POA: Diagnosis not present

## 2014-08-19 DIAGNOSIS — E119 Type 2 diabetes mellitus without complications: Secondary | ICD-10-CM | POA: Diagnosis not present

## 2014-08-19 DIAGNOSIS — D509 Iron deficiency anemia, unspecified: Secondary | ICD-10-CM | POA: Diagnosis not present

## 2014-08-19 DIAGNOSIS — D689 Coagulation defect, unspecified: Secondary | ICD-10-CM | POA: Diagnosis not present

## 2014-08-19 DIAGNOSIS — D631 Anemia in chronic kidney disease: Secondary | ICD-10-CM | POA: Diagnosis not present

## 2014-08-21 DIAGNOSIS — D509 Iron deficiency anemia, unspecified: Secondary | ICD-10-CM | POA: Diagnosis not present

## 2014-08-21 DIAGNOSIS — D689 Coagulation defect, unspecified: Secondary | ICD-10-CM | POA: Diagnosis not present

## 2014-08-21 DIAGNOSIS — E119 Type 2 diabetes mellitus without complications: Secondary | ICD-10-CM | POA: Diagnosis not present

## 2014-08-21 DIAGNOSIS — D631 Anemia in chronic kidney disease: Secondary | ICD-10-CM | POA: Diagnosis not present

## 2014-08-21 DIAGNOSIS — E1129 Type 2 diabetes mellitus with other diabetic kidney complication: Secondary | ICD-10-CM | POA: Diagnosis not present

## 2014-08-21 DIAGNOSIS — N186 End stage renal disease: Secondary | ICD-10-CM | POA: Diagnosis not present

## 2014-08-24 DIAGNOSIS — E119 Type 2 diabetes mellitus without complications: Secondary | ICD-10-CM | POA: Diagnosis not present

## 2014-08-24 DIAGNOSIS — E1129 Type 2 diabetes mellitus with other diabetic kidney complication: Secondary | ICD-10-CM | POA: Diagnosis not present

## 2014-08-24 DIAGNOSIS — N186 End stage renal disease: Secondary | ICD-10-CM | POA: Diagnosis not present

## 2014-08-24 DIAGNOSIS — D631 Anemia in chronic kidney disease: Secondary | ICD-10-CM | POA: Diagnosis not present

## 2014-08-24 DIAGNOSIS — D689 Coagulation defect, unspecified: Secondary | ICD-10-CM | POA: Diagnosis not present

## 2014-08-24 DIAGNOSIS — D509 Iron deficiency anemia, unspecified: Secondary | ICD-10-CM | POA: Diagnosis not present

## 2014-08-26 DIAGNOSIS — N186 End stage renal disease: Secondary | ICD-10-CM | POA: Diagnosis not present

## 2014-08-26 DIAGNOSIS — E119 Type 2 diabetes mellitus without complications: Secondary | ICD-10-CM | POA: Diagnosis not present

## 2014-08-26 DIAGNOSIS — D509 Iron deficiency anemia, unspecified: Secondary | ICD-10-CM | POA: Diagnosis not present

## 2014-08-26 DIAGNOSIS — E1129 Type 2 diabetes mellitus with other diabetic kidney complication: Secondary | ICD-10-CM | POA: Diagnosis not present

## 2014-08-26 DIAGNOSIS — D689 Coagulation defect, unspecified: Secondary | ICD-10-CM | POA: Diagnosis not present

## 2014-08-26 DIAGNOSIS — D631 Anemia in chronic kidney disease: Secondary | ICD-10-CM | POA: Diagnosis not present

## 2014-08-28 DIAGNOSIS — E119 Type 2 diabetes mellitus without complications: Secondary | ICD-10-CM | POA: Diagnosis not present

## 2014-08-28 DIAGNOSIS — E1129 Type 2 diabetes mellitus with other diabetic kidney complication: Secondary | ICD-10-CM | POA: Diagnosis not present

## 2014-08-28 DIAGNOSIS — D509 Iron deficiency anemia, unspecified: Secondary | ICD-10-CM | POA: Diagnosis not present

## 2014-08-28 DIAGNOSIS — D631 Anemia in chronic kidney disease: Secondary | ICD-10-CM | POA: Diagnosis not present

## 2014-08-28 DIAGNOSIS — D689 Coagulation defect, unspecified: Secondary | ICD-10-CM | POA: Diagnosis not present

## 2014-08-28 DIAGNOSIS — N186 End stage renal disease: Secondary | ICD-10-CM | POA: Diagnosis not present

## 2014-08-31 DIAGNOSIS — D631 Anemia in chronic kidney disease: Secondary | ICD-10-CM | POA: Diagnosis not present

## 2014-08-31 DIAGNOSIS — D689 Coagulation defect, unspecified: Secondary | ICD-10-CM | POA: Diagnosis not present

## 2014-08-31 DIAGNOSIS — N186 End stage renal disease: Secondary | ICD-10-CM | POA: Diagnosis not present

## 2014-08-31 DIAGNOSIS — D509 Iron deficiency anemia, unspecified: Secondary | ICD-10-CM | POA: Diagnosis not present

## 2014-08-31 DIAGNOSIS — E1129 Type 2 diabetes mellitus with other diabetic kidney complication: Secondary | ICD-10-CM | POA: Diagnosis not present

## 2014-08-31 DIAGNOSIS — E119 Type 2 diabetes mellitus without complications: Secondary | ICD-10-CM | POA: Diagnosis not present

## 2014-09-02 DIAGNOSIS — E1129 Type 2 diabetes mellitus with other diabetic kidney complication: Secondary | ICD-10-CM | POA: Diagnosis not present

## 2014-09-02 DIAGNOSIS — D689 Coagulation defect, unspecified: Secondary | ICD-10-CM | POA: Diagnosis not present

## 2014-09-02 DIAGNOSIS — D509 Iron deficiency anemia, unspecified: Secondary | ICD-10-CM | POA: Diagnosis not present

## 2014-09-02 DIAGNOSIS — D631 Anemia in chronic kidney disease: Secondary | ICD-10-CM | POA: Diagnosis not present

## 2014-09-02 DIAGNOSIS — E119 Type 2 diabetes mellitus without complications: Secondary | ICD-10-CM | POA: Diagnosis not present

## 2014-09-02 DIAGNOSIS — N186 End stage renal disease: Secondary | ICD-10-CM | POA: Diagnosis not present

## 2014-09-04 DIAGNOSIS — E1129 Type 2 diabetes mellitus with other diabetic kidney complication: Secondary | ICD-10-CM | POA: Diagnosis not present

## 2014-09-04 DIAGNOSIS — D631 Anemia in chronic kidney disease: Secondary | ICD-10-CM | POA: Diagnosis not present

## 2014-09-04 DIAGNOSIS — N186 End stage renal disease: Secondary | ICD-10-CM | POA: Diagnosis not present

## 2014-09-04 DIAGNOSIS — D689 Coagulation defect, unspecified: Secondary | ICD-10-CM | POA: Diagnosis not present

## 2014-09-04 DIAGNOSIS — D509 Iron deficiency anemia, unspecified: Secondary | ICD-10-CM | POA: Diagnosis not present

## 2014-09-04 DIAGNOSIS — E119 Type 2 diabetes mellitus without complications: Secondary | ICD-10-CM | POA: Diagnosis not present

## 2014-09-06 DIAGNOSIS — I10 Essential (primary) hypertension: Secondary | ICD-10-CM | POA: Diagnosis not present

## 2014-09-06 DIAGNOSIS — J309 Allergic rhinitis, unspecified: Secondary | ICD-10-CM | POA: Diagnosis not present

## 2014-09-07 DIAGNOSIS — D509 Iron deficiency anemia, unspecified: Secondary | ICD-10-CM | POA: Diagnosis not present

## 2014-09-07 DIAGNOSIS — E1129 Type 2 diabetes mellitus with other diabetic kidney complication: Secondary | ICD-10-CM | POA: Diagnosis not present

## 2014-09-07 DIAGNOSIS — N186 End stage renal disease: Secondary | ICD-10-CM | POA: Diagnosis not present

## 2014-09-07 DIAGNOSIS — D631 Anemia in chronic kidney disease: Secondary | ICD-10-CM | POA: Diagnosis not present

## 2014-09-07 DIAGNOSIS — E119 Type 2 diabetes mellitus without complications: Secondary | ICD-10-CM | POA: Diagnosis not present

## 2014-09-07 DIAGNOSIS — D689 Coagulation defect, unspecified: Secondary | ICD-10-CM | POA: Diagnosis not present

## 2014-09-09 DIAGNOSIS — D509 Iron deficiency anemia, unspecified: Secondary | ICD-10-CM | POA: Diagnosis not present

## 2014-09-09 DIAGNOSIS — E119 Type 2 diabetes mellitus without complications: Secondary | ICD-10-CM | POA: Diagnosis not present

## 2014-09-09 DIAGNOSIS — D689 Coagulation defect, unspecified: Secondary | ICD-10-CM | POA: Diagnosis not present

## 2014-09-09 DIAGNOSIS — N186 End stage renal disease: Secondary | ICD-10-CM | POA: Diagnosis not present

## 2014-09-09 DIAGNOSIS — D631 Anemia in chronic kidney disease: Secondary | ICD-10-CM | POA: Diagnosis not present

## 2014-09-09 DIAGNOSIS — E1129 Type 2 diabetes mellitus with other diabetic kidney complication: Secondary | ICD-10-CM | POA: Diagnosis not present

## 2014-09-11 DIAGNOSIS — E119 Type 2 diabetes mellitus without complications: Secondary | ICD-10-CM | POA: Diagnosis not present

## 2014-09-11 DIAGNOSIS — E1129 Type 2 diabetes mellitus with other diabetic kidney complication: Secondary | ICD-10-CM | POA: Diagnosis not present

## 2014-09-11 DIAGNOSIS — N186 End stage renal disease: Secondary | ICD-10-CM | POA: Diagnosis not present

## 2014-09-11 DIAGNOSIS — D689 Coagulation defect, unspecified: Secondary | ICD-10-CM | POA: Diagnosis not present

## 2014-09-11 DIAGNOSIS — D509 Iron deficiency anemia, unspecified: Secondary | ICD-10-CM | POA: Diagnosis not present

## 2014-09-11 DIAGNOSIS — D631 Anemia in chronic kidney disease: Secondary | ICD-10-CM | POA: Diagnosis not present

## 2014-09-13 DIAGNOSIS — J309 Allergic rhinitis, unspecified: Secondary | ICD-10-CM | POA: Diagnosis not present

## 2014-09-14 DIAGNOSIS — D509 Iron deficiency anemia, unspecified: Secondary | ICD-10-CM | POA: Diagnosis not present

## 2014-09-14 DIAGNOSIS — D689 Coagulation defect, unspecified: Secondary | ICD-10-CM | POA: Diagnosis not present

## 2014-09-14 DIAGNOSIS — E1129 Type 2 diabetes mellitus with other diabetic kidney complication: Secondary | ICD-10-CM | POA: Diagnosis not present

## 2014-09-14 DIAGNOSIS — D631 Anemia in chronic kidney disease: Secondary | ICD-10-CM | POA: Diagnosis not present

## 2014-09-14 DIAGNOSIS — E119 Type 2 diabetes mellitus without complications: Secondary | ICD-10-CM | POA: Diagnosis not present

## 2014-09-14 DIAGNOSIS — N186 End stage renal disease: Secondary | ICD-10-CM | POA: Diagnosis not present

## 2014-09-16 DIAGNOSIS — E119 Type 2 diabetes mellitus without complications: Secondary | ICD-10-CM | POA: Diagnosis not present

## 2014-09-16 DIAGNOSIS — E1129 Type 2 diabetes mellitus with other diabetic kidney complication: Secondary | ICD-10-CM | POA: Diagnosis not present

## 2014-09-16 DIAGNOSIS — D509 Iron deficiency anemia, unspecified: Secondary | ICD-10-CM | POA: Diagnosis not present

## 2014-09-16 DIAGNOSIS — D689 Coagulation defect, unspecified: Secondary | ICD-10-CM | POA: Diagnosis not present

## 2014-09-16 DIAGNOSIS — N186 End stage renal disease: Secondary | ICD-10-CM | POA: Diagnosis not present

## 2014-09-16 DIAGNOSIS — D631 Anemia in chronic kidney disease: Secondary | ICD-10-CM | POA: Diagnosis not present

## 2014-09-17 DIAGNOSIS — M2550 Pain in unspecified joint: Secondary | ICD-10-CM | POA: Diagnosis not present

## 2014-09-17 DIAGNOSIS — Z79899 Other long term (current) drug therapy: Secondary | ICD-10-CM | POA: Diagnosis not present

## 2014-09-17 DIAGNOSIS — J309 Allergic rhinitis, unspecified: Secondary | ICD-10-CM | POA: Diagnosis not present

## 2014-09-18 DIAGNOSIS — D631 Anemia in chronic kidney disease: Secondary | ICD-10-CM | POA: Diagnosis not present

## 2014-09-18 DIAGNOSIS — D689 Coagulation defect, unspecified: Secondary | ICD-10-CM | POA: Diagnosis not present

## 2014-09-18 DIAGNOSIS — D509 Iron deficiency anemia, unspecified: Secondary | ICD-10-CM | POA: Diagnosis not present

## 2014-09-18 DIAGNOSIS — N186 End stage renal disease: Secondary | ICD-10-CM | POA: Diagnosis not present

## 2014-09-18 DIAGNOSIS — E119 Type 2 diabetes mellitus without complications: Secondary | ICD-10-CM | POA: Diagnosis not present

## 2014-09-18 DIAGNOSIS — Z992 Dependence on renal dialysis: Secondary | ICD-10-CM | POA: Diagnosis not present

## 2014-09-18 DIAGNOSIS — E1129 Type 2 diabetes mellitus with other diabetic kidney complication: Secondary | ICD-10-CM | POA: Diagnosis not present

## 2014-09-21 DIAGNOSIS — D631 Anemia in chronic kidney disease: Secondary | ICD-10-CM | POA: Diagnosis not present

## 2014-09-21 DIAGNOSIS — E119 Type 2 diabetes mellitus without complications: Secondary | ICD-10-CM | POA: Diagnosis not present

## 2014-09-21 DIAGNOSIS — N186 End stage renal disease: Secondary | ICD-10-CM | POA: Diagnosis not present

## 2014-09-21 DIAGNOSIS — D509 Iron deficiency anemia, unspecified: Secondary | ICD-10-CM | POA: Diagnosis not present

## 2014-09-21 DIAGNOSIS — E1129 Type 2 diabetes mellitus with other diabetic kidney complication: Secondary | ICD-10-CM | POA: Diagnosis not present

## 2014-09-21 DIAGNOSIS — D689 Coagulation defect, unspecified: Secondary | ICD-10-CM | POA: Diagnosis not present

## 2014-09-23 DIAGNOSIS — D631 Anemia in chronic kidney disease: Secondary | ICD-10-CM | POA: Diagnosis not present

## 2014-09-23 DIAGNOSIS — E1129 Type 2 diabetes mellitus with other diabetic kidney complication: Secondary | ICD-10-CM | POA: Diagnosis not present

## 2014-09-23 DIAGNOSIS — N186 End stage renal disease: Secondary | ICD-10-CM | POA: Diagnosis not present

## 2014-09-23 DIAGNOSIS — E119 Type 2 diabetes mellitus without complications: Secondary | ICD-10-CM | POA: Diagnosis not present

## 2014-09-23 DIAGNOSIS — D509 Iron deficiency anemia, unspecified: Secondary | ICD-10-CM | POA: Diagnosis not present

## 2014-09-23 DIAGNOSIS — D689 Coagulation defect, unspecified: Secondary | ICD-10-CM | POA: Diagnosis not present

## 2014-09-25 DIAGNOSIS — D689 Coagulation defect, unspecified: Secondary | ICD-10-CM | POA: Diagnosis not present

## 2014-09-25 DIAGNOSIS — N186 End stage renal disease: Secondary | ICD-10-CM | POA: Diagnosis not present

## 2014-09-25 DIAGNOSIS — E1129 Type 2 diabetes mellitus with other diabetic kidney complication: Secondary | ICD-10-CM | POA: Diagnosis not present

## 2014-09-25 DIAGNOSIS — D631 Anemia in chronic kidney disease: Secondary | ICD-10-CM | POA: Diagnosis not present

## 2014-09-25 DIAGNOSIS — E119 Type 2 diabetes mellitus without complications: Secondary | ICD-10-CM | POA: Diagnosis not present

## 2014-09-25 DIAGNOSIS — D509 Iron deficiency anemia, unspecified: Secondary | ICD-10-CM | POA: Diagnosis not present

## 2014-09-28 DIAGNOSIS — D509 Iron deficiency anemia, unspecified: Secondary | ICD-10-CM | POA: Diagnosis not present

## 2014-09-28 DIAGNOSIS — D631 Anemia in chronic kidney disease: Secondary | ICD-10-CM | POA: Diagnosis not present

## 2014-09-28 DIAGNOSIS — E1129 Type 2 diabetes mellitus with other diabetic kidney complication: Secondary | ICD-10-CM | POA: Diagnosis not present

## 2014-09-28 DIAGNOSIS — D689 Coagulation defect, unspecified: Secondary | ICD-10-CM | POA: Diagnosis not present

## 2014-09-28 DIAGNOSIS — N186 End stage renal disease: Secondary | ICD-10-CM | POA: Diagnosis not present

## 2014-09-28 DIAGNOSIS — E119 Type 2 diabetes mellitus without complications: Secondary | ICD-10-CM | POA: Diagnosis not present

## 2014-09-30 DIAGNOSIS — D689 Coagulation defect, unspecified: Secondary | ICD-10-CM | POA: Diagnosis not present

## 2014-09-30 DIAGNOSIS — E119 Type 2 diabetes mellitus without complications: Secondary | ICD-10-CM | POA: Diagnosis not present

## 2014-09-30 DIAGNOSIS — N186 End stage renal disease: Secondary | ICD-10-CM | POA: Diagnosis not present

## 2014-09-30 DIAGNOSIS — E1129 Type 2 diabetes mellitus with other diabetic kidney complication: Secondary | ICD-10-CM | POA: Diagnosis not present

## 2014-09-30 DIAGNOSIS — D631 Anemia in chronic kidney disease: Secondary | ICD-10-CM | POA: Diagnosis not present

## 2014-09-30 DIAGNOSIS — E11339 Type 2 diabetes mellitus with moderate nonproliferative diabetic retinopathy without macular edema: Secondary | ICD-10-CM | POA: Diagnosis not present

## 2014-09-30 DIAGNOSIS — D509 Iron deficiency anemia, unspecified: Secondary | ICD-10-CM | POA: Diagnosis not present

## 2014-10-02 DIAGNOSIS — D631 Anemia in chronic kidney disease: Secondary | ICD-10-CM | POA: Diagnosis not present

## 2014-10-02 DIAGNOSIS — E1129 Type 2 diabetes mellitus with other diabetic kidney complication: Secondary | ICD-10-CM | POA: Diagnosis not present

## 2014-10-02 DIAGNOSIS — D689 Coagulation defect, unspecified: Secondary | ICD-10-CM | POA: Diagnosis not present

## 2014-10-02 DIAGNOSIS — D509 Iron deficiency anemia, unspecified: Secondary | ICD-10-CM | POA: Diagnosis not present

## 2014-10-02 DIAGNOSIS — N186 End stage renal disease: Secondary | ICD-10-CM | POA: Diagnosis not present

## 2014-10-02 DIAGNOSIS — E119 Type 2 diabetes mellitus without complications: Secondary | ICD-10-CM | POA: Diagnosis not present

## 2014-10-05 DIAGNOSIS — D689 Coagulation defect, unspecified: Secondary | ICD-10-CM | POA: Diagnosis not present

## 2014-10-05 DIAGNOSIS — D509 Iron deficiency anemia, unspecified: Secondary | ICD-10-CM | POA: Diagnosis not present

## 2014-10-05 DIAGNOSIS — N186 End stage renal disease: Secondary | ICD-10-CM | POA: Diagnosis not present

## 2014-10-05 DIAGNOSIS — D631 Anemia in chronic kidney disease: Secondary | ICD-10-CM | POA: Diagnosis not present

## 2014-10-05 DIAGNOSIS — E119 Type 2 diabetes mellitus without complications: Secondary | ICD-10-CM | POA: Diagnosis not present

## 2014-10-05 DIAGNOSIS — E1129 Type 2 diabetes mellitus with other diabetic kidney complication: Secondary | ICD-10-CM | POA: Diagnosis not present

## 2014-10-07 DIAGNOSIS — E119 Type 2 diabetes mellitus without complications: Secondary | ICD-10-CM | POA: Diagnosis not present

## 2014-10-07 DIAGNOSIS — E1129 Type 2 diabetes mellitus with other diabetic kidney complication: Secondary | ICD-10-CM | POA: Diagnosis not present

## 2014-10-07 DIAGNOSIS — N186 End stage renal disease: Secondary | ICD-10-CM | POA: Diagnosis not present

## 2014-10-07 DIAGNOSIS — D509 Iron deficiency anemia, unspecified: Secondary | ICD-10-CM | POA: Diagnosis not present

## 2014-10-07 DIAGNOSIS — D689 Coagulation defect, unspecified: Secondary | ICD-10-CM | POA: Diagnosis not present

## 2014-10-07 DIAGNOSIS — D631 Anemia in chronic kidney disease: Secondary | ICD-10-CM | POA: Diagnosis not present

## 2014-10-09 DIAGNOSIS — E119 Type 2 diabetes mellitus without complications: Secondary | ICD-10-CM | POA: Diagnosis not present

## 2014-10-09 DIAGNOSIS — D509 Iron deficiency anemia, unspecified: Secondary | ICD-10-CM | POA: Diagnosis not present

## 2014-10-09 DIAGNOSIS — N186 End stage renal disease: Secondary | ICD-10-CM | POA: Diagnosis not present

## 2014-10-09 DIAGNOSIS — D689 Coagulation defect, unspecified: Secondary | ICD-10-CM | POA: Diagnosis not present

## 2014-10-09 DIAGNOSIS — E1129 Type 2 diabetes mellitus with other diabetic kidney complication: Secondary | ICD-10-CM | POA: Diagnosis not present

## 2014-10-09 DIAGNOSIS — D631 Anemia in chronic kidney disease: Secondary | ICD-10-CM | POA: Diagnosis not present

## 2014-10-12 DIAGNOSIS — E119 Type 2 diabetes mellitus without complications: Secondary | ICD-10-CM | POA: Diagnosis not present

## 2014-10-12 DIAGNOSIS — D631 Anemia in chronic kidney disease: Secondary | ICD-10-CM | POA: Diagnosis not present

## 2014-10-12 DIAGNOSIS — E1129 Type 2 diabetes mellitus with other diabetic kidney complication: Secondary | ICD-10-CM | POA: Diagnosis not present

## 2014-10-12 DIAGNOSIS — D689 Coagulation defect, unspecified: Secondary | ICD-10-CM | POA: Diagnosis not present

## 2014-10-12 DIAGNOSIS — D509 Iron deficiency anemia, unspecified: Secondary | ICD-10-CM | POA: Diagnosis not present

## 2014-10-12 DIAGNOSIS — N186 End stage renal disease: Secondary | ICD-10-CM | POA: Diagnosis not present

## 2014-10-14 DIAGNOSIS — D509 Iron deficiency anemia, unspecified: Secondary | ICD-10-CM | POA: Diagnosis not present

## 2014-10-14 DIAGNOSIS — D689 Coagulation defect, unspecified: Secondary | ICD-10-CM | POA: Diagnosis not present

## 2014-10-14 DIAGNOSIS — E1129 Type 2 diabetes mellitus with other diabetic kidney complication: Secondary | ICD-10-CM | POA: Diagnosis not present

## 2014-10-14 DIAGNOSIS — N186 End stage renal disease: Secondary | ICD-10-CM | POA: Diagnosis not present

## 2014-10-14 DIAGNOSIS — D631 Anemia in chronic kidney disease: Secondary | ICD-10-CM | POA: Diagnosis not present

## 2014-10-14 DIAGNOSIS — E119 Type 2 diabetes mellitus without complications: Secondary | ICD-10-CM | POA: Diagnosis not present

## 2014-10-16 DIAGNOSIS — D509 Iron deficiency anemia, unspecified: Secondary | ICD-10-CM | POA: Diagnosis not present

## 2014-10-16 DIAGNOSIS — D689 Coagulation defect, unspecified: Secondary | ICD-10-CM | POA: Diagnosis not present

## 2014-10-16 DIAGNOSIS — N186 End stage renal disease: Secondary | ICD-10-CM | POA: Diagnosis not present

## 2014-10-16 DIAGNOSIS — E119 Type 2 diabetes mellitus without complications: Secondary | ICD-10-CM | POA: Diagnosis not present

## 2014-10-16 DIAGNOSIS — D631 Anemia in chronic kidney disease: Secondary | ICD-10-CM | POA: Diagnosis not present

## 2014-10-16 DIAGNOSIS — E1129 Type 2 diabetes mellitus with other diabetic kidney complication: Secondary | ICD-10-CM | POA: Diagnosis not present

## 2014-10-19 DIAGNOSIS — N186 End stage renal disease: Secondary | ICD-10-CM | POA: Diagnosis not present

## 2014-10-19 DIAGNOSIS — E1129 Type 2 diabetes mellitus with other diabetic kidney complication: Secondary | ICD-10-CM | POA: Diagnosis not present

## 2014-10-19 DIAGNOSIS — D509 Iron deficiency anemia, unspecified: Secondary | ICD-10-CM | POA: Diagnosis not present

## 2014-10-19 DIAGNOSIS — D631 Anemia in chronic kidney disease: Secondary | ICD-10-CM | POA: Diagnosis not present

## 2014-10-19 DIAGNOSIS — Z992 Dependence on renal dialysis: Secondary | ICD-10-CM | POA: Diagnosis not present

## 2014-10-19 DIAGNOSIS — E119 Type 2 diabetes mellitus without complications: Secondary | ICD-10-CM | POA: Diagnosis not present

## 2014-10-19 DIAGNOSIS — D689 Coagulation defect, unspecified: Secondary | ICD-10-CM | POA: Diagnosis not present

## 2014-10-21 DIAGNOSIS — E1129 Type 2 diabetes mellitus with other diabetic kidney complication: Secondary | ICD-10-CM | POA: Diagnosis not present

## 2014-10-21 DIAGNOSIS — D631 Anemia in chronic kidney disease: Secondary | ICD-10-CM | POA: Diagnosis not present

## 2014-10-21 DIAGNOSIS — N186 End stage renal disease: Secondary | ICD-10-CM | POA: Diagnosis not present

## 2014-10-21 DIAGNOSIS — D689 Coagulation defect, unspecified: Secondary | ICD-10-CM | POA: Diagnosis not present

## 2014-10-21 DIAGNOSIS — R52 Pain, unspecified: Secondary | ICD-10-CM | POA: Diagnosis not present

## 2014-10-21 DIAGNOSIS — E119 Type 2 diabetes mellitus without complications: Secondary | ICD-10-CM | POA: Diagnosis not present

## 2014-10-23 DIAGNOSIS — R52 Pain, unspecified: Secondary | ICD-10-CM | POA: Diagnosis not present

## 2014-10-23 DIAGNOSIS — E1129 Type 2 diabetes mellitus with other diabetic kidney complication: Secondary | ICD-10-CM | POA: Diagnosis not present

## 2014-10-23 DIAGNOSIS — D631 Anemia in chronic kidney disease: Secondary | ICD-10-CM | POA: Diagnosis not present

## 2014-10-23 DIAGNOSIS — E119 Type 2 diabetes mellitus without complications: Secondary | ICD-10-CM | POA: Diagnosis not present

## 2014-10-23 DIAGNOSIS — N186 End stage renal disease: Secondary | ICD-10-CM | POA: Diagnosis not present

## 2014-10-23 DIAGNOSIS — D689 Coagulation defect, unspecified: Secondary | ICD-10-CM | POA: Diagnosis not present

## 2014-10-26 DIAGNOSIS — D631 Anemia in chronic kidney disease: Secondary | ICD-10-CM | POA: Diagnosis not present

## 2014-10-26 DIAGNOSIS — D689 Coagulation defect, unspecified: Secondary | ICD-10-CM | POA: Diagnosis not present

## 2014-10-26 DIAGNOSIS — R52 Pain, unspecified: Secondary | ICD-10-CM | POA: Diagnosis not present

## 2014-10-26 DIAGNOSIS — E119 Type 2 diabetes mellitus without complications: Secondary | ICD-10-CM | POA: Diagnosis not present

## 2014-10-26 DIAGNOSIS — N186 End stage renal disease: Secondary | ICD-10-CM | POA: Diagnosis not present

## 2014-10-26 DIAGNOSIS — E1129 Type 2 diabetes mellitus with other diabetic kidney complication: Secondary | ICD-10-CM | POA: Diagnosis not present

## 2014-10-28 DIAGNOSIS — D689 Coagulation defect, unspecified: Secondary | ICD-10-CM | POA: Diagnosis not present

## 2014-10-28 DIAGNOSIS — N186 End stage renal disease: Secondary | ICD-10-CM | POA: Diagnosis not present

## 2014-10-28 DIAGNOSIS — R52 Pain, unspecified: Secondary | ICD-10-CM | POA: Diagnosis not present

## 2014-10-28 DIAGNOSIS — E119 Type 2 diabetes mellitus without complications: Secondary | ICD-10-CM | POA: Diagnosis not present

## 2014-10-28 DIAGNOSIS — D631 Anemia in chronic kidney disease: Secondary | ICD-10-CM | POA: Diagnosis not present

## 2014-10-28 DIAGNOSIS — E1129 Type 2 diabetes mellitus with other diabetic kidney complication: Secondary | ICD-10-CM | POA: Diagnosis not present

## 2014-10-30 DIAGNOSIS — N186 End stage renal disease: Secondary | ICD-10-CM | POA: Diagnosis not present

## 2014-10-30 DIAGNOSIS — E119 Type 2 diabetes mellitus without complications: Secondary | ICD-10-CM | POA: Diagnosis not present

## 2014-10-30 DIAGNOSIS — E1129 Type 2 diabetes mellitus with other diabetic kidney complication: Secondary | ICD-10-CM | POA: Diagnosis not present

## 2014-10-30 DIAGNOSIS — D631 Anemia in chronic kidney disease: Secondary | ICD-10-CM | POA: Diagnosis not present

## 2014-10-30 DIAGNOSIS — D689 Coagulation defect, unspecified: Secondary | ICD-10-CM | POA: Diagnosis not present

## 2014-10-30 DIAGNOSIS — R52 Pain, unspecified: Secondary | ICD-10-CM | POA: Diagnosis not present

## 2014-11-02 DIAGNOSIS — E119 Type 2 diabetes mellitus without complications: Secondary | ICD-10-CM | POA: Diagnosis not present

## 2014-11-02 DIAGNOSIS — E1129 Type 2 diabetes mellitus with other diabetic kidney complication: Secondary | ICD-10-CM | POA: Diagnosis not present

## 2014-11-02 DIAGNOSIS — D689 Coagulation defect, unspecified: Secondary | ICD-10-CM | POA: Diagnosis not present

## 2014-11-02 DIAGNOSIS — R52 Pain, unspecified: Secondary | ICD-10-CM | POA: Diagnosis not present

## 2014-11-02 DIAGNOSIS — N186 End stage renal disease: Secondary | ICD-10-CM | POA: Diagnosis not present

## 2014-11-02 DIAGNOSIS — D631 Anemia in chronic kidney disease: Secondary | ICD-10-CM | POA: Diagnosis not present

## 2014-11-04 DIAGNOSIS — N186 End stage renal disease: Secondary | ICD-10-CM | POA: Diagnosis not present

## 2014-11-04 DIAGNOSIS — R52 Pain, unspecified: Secondary | ICD-10-CM | POA: Diagnosis not present

## 2014-11-04 DIAGNOSIS — E119 Type 2 diabetes mellitus without complications: Secondary | ICD-10-CM | POA: Diagnosis not present

## 2014-11-04 DIAGNOSIS — E1129 Type 2 diabetes mellitus with other diabetic kidney complication: Secondary | ICD-10-CM | POA: Diagnosis not present

## 2014-11-04 DIAGNOSIS — D689 Coagulation defect, unspecified: Secondary | ICD-10-CM | POA: Diagnosis not present

## 2014-11-04 DIAGNOSIS — D631 Anemia in chronic kidney disease: Secondary | ICD-10-CM | POA: Diagnosis not present

## 2014-11-06 DIAGNOSIS — E119 Type 2 diabetes mellitus without complications: Secondary | ICD-10-CM | POA: Diagnosis not present

## 2014-11-06 DIAGNOSIS — D689 Coagulation defect, unspecified: Secondary | ICD-10-CM | POA: Diagnosis not present

## 2014-11-06 DIAGNOSIS — R52 Pain, unspecified: Secondary | ICD-10-CM | POA: Diagnosis not present

## 2014-11-06 DIAGNOSIS — D631 Anemia in chronic kidney disease: Secondary | ICD-10-CM | POA: Diagnosis not present

## 2014-11-06 DIAGNOSIS — N186 End stage renal disease: Secondary | ICD-10-CM | POA: Diagnosis not present

## 2014-11-06 DIAGNOSIS — E1129 Type 2 diabetes mellitus with other diabetic kidney complication: Secondary | ICD-10-CM | POA: Diagnosis not present

## 2014-11-09 DIAGNOSIS — D689 Coagulation defect, unspecified: Secondary | ICD-10-CM | POA: Diagnosis not present

## 2014-11-09 DIAGNOSIS — D631 Anemia in chronic kidney disease: Secondary | ICD-10-CM | POA: Diagnosis not present

## 2014-11-09 DIAGNOSIS — N186 End stage renal disease: Secondary | ICD-10-CM | POA: Diagnosis not present

## 2014-11-09 DIAGNOSIS — E1129 Type 2 diabetes mellitus with other diabetic kidney complication: Secondary | ICD-10-CM | POA: Diagnosis not present

## 2014-11-09 DIAGNOSIS — R52 Pain, unspecified: Secondary | ICD-10-CM | POA: Diagnosis not present

## 2014-11-09 DIAGNOSIS — E119 Type 2 diabetes mellitus without complications: Secondary | ICD-10-CM | POA: Diagnosis not present

## 2014-11-11 DIAGNOSIS — D631 Anemia in chronic kidney disease: Secondary | ICD-10-CM | POA: Diagnosis not present

## 2014-11-11 DIAGNOSIS — E119 Type 2 diabetes mellitus without complications: Secondary | ICD-10-CM | POA: Diagnosis not present

## 2014-11-11 DIAGNOSIS — D689 Coagulation defect, unspecified: Secondary | ICD-10-CM | POA: Diagnosis not present

## 2014-11-11 DIAGNOSIS — N186 End stage renal disease: Secondary | ICD-10-CM | POA: Diagnosis not present

## 2014-11-11 DIAGNOSIS — R52 Pain, unspecified: Secondary | ICD-10-CM | POA: Diagnosis not present

## 2014-11-11 DIAGNOSIS — E1129 Type 2 diabetes mellitus with other diabetic kidney complication: Secondary | ICD-10-CM | POA: Diagnosis not present

## 2014-11-13 DIAGNOSIS — E1129 Type 2 diabetes mellitus with other diabetic kidney complication: Secondary | ICD-10-CM | POA: Diagnosis not present

## 2014-11-13 DIAGNOSIS — N186 End stage renal disease: Secondary | ICD-10-CM | POA: Diagnosis not present

## 2014-11-13 DIAGNOSIS — E119 Type 2 diabetes mellitus without complications: Secondary | ICD-10-CM | POA: Diagnosis not present

## 2014-11-13 DIAGNOSIS — D689 Coagulation defect, unspecified: Secondary | ICD-10-CM | POA: Diagnosis not present

## 2014-11-13 DIAGNOSIS — R52 Pain, unspecified: Secondary | ICD-10-CM | POA: Diagnosis not present

## 2014-11-13 DIAGNOSIS — D631 Anemia in chronic kidney disease: Secondary | ICD-10-CM | POA: Diagnosis not present

## 2014-11-15 DIAGNOSIS — M549 Dorsalgia, unspecified: Secondary | ICD-10-CM | POA: Diagnosis not present

## 2014-11-15 DIAGNOSIS — J309 Allergic rhinitis, unspecified: Secondary | ICD-10-CM | POA: Diagnosis not present

## 2014-11-15 DIAGNOSIS — I10 Essential (primary) hypertension: Secondary | ICD-10-CM | POA: Diagnosis not present

## 2014-11-15 DIAGNOSIS — N186 End stage renal disease: Secondary | ICD-10-CM | POA: Diagnosis not present

## 2014-11-16 DIAGNOSIS — E1129 Type 2 diabetes mellitus with other diabetic kidney complication: Secondary | ICD-10-CM | POA: Diagnosis not present

## 2014-11-16 DIAGNOSIS — D631 Anemia in chronic kidney disease: Secondary | ICD-10-CM | POA: Diagnosis not present

## 2014-11-16 DIAGNOSIS — R52 Pain, unspecified: Secondary | ICD-10-CM | POA: Diagnosis not present

## 2014-11-16 DIAGNOSIS — E119 Type 2 diabetes mellitus without complications: Secondary | ICD-10-CM | POA: Diagnosis not present

## 2014-11-16 DIAGNOSIS — D689 Coagulation defect, unspecified: Secondary | ICD-10-CM | POA: Diagnosis not present

## 2014-11-16 DIAGNOSIS — N186 End stage renal disease: Secondary | ICD-10-CM | POA: Diagnosis not present

## 2014-11-17 DIAGNOSIS — A499 Bacterial infection, unspecified: Secondary | ICD-10-CM | POA: Diagnosis not present

## 2014-11-17 DIAGNOSIS — R319 Hematuria, unspecified: Secondary | ICD-10-CM | POA: Diagnosis not present

## 2014-11-18 DIAGNOSIS — Z992 Dependence on renal dialysis: Secondary | ICD-10-CM | POA: Diagnosis not present

## 2014-11-18 DIAGNOSIS — E1129 Type 2 diabetes mellitus with other diabetic kidney complication: Secondary | ICD-10-CM | POA: Diagnosis not present

## 2014-11-18 DIAGNOSIS — E119 Type 2 diabetes mellitus without complications: Secondary | ICD-10-CM | POA: Diagnosis not present

## 2014-11-18 DIAGNOSIS — D631 Anemia in chronic kidney disease: Secondary | ICD-10-CM | POA: Diagnosis not present

## 2014-11-18 DIAGNOSIS — D689 Coagulation defect, unspecified: Secondary | ICD-10-CM | POA: Diagnosis not present

## 2014-11-18 DIAGNOSIS — R52 Pain, unspecified: Secondary | ICD-10-CM | POA: Diagnosis not present

## 2014-11-18 DIAGNOSIS — N186 End stage renal disease: Secondary | ICD-10-CM | POA: Diagnosis not present

## 2014-11-20 DIAGNOSIS — E1129 Type 2 diabetes mellitus with other diabetic kidney complication: Secondary | ICD-10-CM | POA: Diagnosis not present

## 2014-11-20 DIAGNOSIS — D631 Anemia in chronic kidney disease: Secondary | ICD-10-CM | POA: Diagnosis not present

## 2014-11-20 DIAGNOSIS — N186 End stage renal disease: Secondary | ICD-10-CM | POA: Diagnosis not present

## 2014-11-20 DIAGNOSIS — D689 Coagulation defect, unspecified: Secondary | ICD-10-CM | POA: Diagnosis not present

## 2014-11-20 DIAGNOSIS — E119 Type 2 diabetes mellitus without complications: Secondary | ICD-10-CM | POA: Diagnosis not present

## 2014-11-23 DIAGNOSIS — E119 Type 2 diabetes mellitus without complications: Secondary | ICD-10-CM | POA: Diagnosis not present

## 2014-11-23 DIAGNOSIS — E1129 Type 2 diabetes mellitus with other diabetic kidney complication: Secondary | ICD-10-CM | POA: Diagnosis not present

## 2014-11-23 DIAGNOSIS — D689 Coagulation defect, unspecified: Secondary | ICD-10-CM | POA: Diagnosis not present

## 2014-11-23 DIAGNOSIS — N186 End stage renal disease: Secondary | ICD-10-CM | POA: Diagnosis not present

## 2014-11-23 DIAGNOSIS — D631 Anemia in chronic kidney disease: Secondary | ICD-10-CM | POA: Diagnosis not present

## 2014-11-25 DIAGNOSIS — N186 End stage renal disease: Secondary | ICD-10-CM | POA: Diagnosis not present

## 2014-11-25 DIAGNOSIS — D689 Coagulation defect, unspecified: Secondary | ICD-10-CM | POA: Diagnosis not present

## 2014-11-25 DIAGNOSIS — D631 Anemia in chronic kidney disease: Secondary | ICD-10-CM | POA: Diagnosis not present

## 2014-11-25 DIAGNOSIS — E119 Type 2 diabetes mellitus without complications: Secondary | ICD-10-CM | POA: Diagnosis not present

## 2014-11-25 DIAGNOSIS — E1129 Type 2 diabetes mellitus with other diabetic kidney complication: Secondary | ICD-10-CM | POA: Diagnosis not present

## 2014-11-27 DIAGNOSIS — D689 Coagulation defect, unspecified: Secondary | ICD-10-CM | POA: Diagnosis not present

## 2014-11-27 DIAGNOSIS — N186 End stage renal disease: Secondary | ICD-10-CM | POA: Diagnosis not present

## 2014-11-27 DIAGNOSIS — E1129 Type 2 diabetes mellitus with other diabetic kidney complication: Secondary | ICD-10-CM | POA: Diagnosis not present

## 2014-11-27 DIAGNOSIS — D631 Anemia in chronic kidney disease: Secondary | ICD-10-CM | POA: Diagnosis not present

## 2014-11-27 DIAGNOSIS — E119 Type 2 diabetes mellitus without complications: Secondary | ICD-10-CM | POA: Diagnosis not present

## 2014-11-30 DIAGNOSIS — E1129 Type 2 diabetes mellitus with other diabetic kidney complication: Secondary | ICD-10-CM | POA: Diagnosis not present

## 2014-11-30 DIAGNOSIS — N186 End stage renal disease: Secondary | ICD-10-CM | POA: Diagnosis not present

## 2014-11-30 DIAGNOSIS — E119 Type 2 diabetes mellitus without complications: Secondary | ICD-10-CM | POA: Diagnosis not present

## 2014-11-30 DIAGNOSIS — D631 Anemia in chronic kidney disease: Secondary | ICD-10-CM | POA: Diagnosis not present

## 2014-11-30 DIAGNOSIS — D689 Coagulation defect, unspecified: Secondary | ICD-10-CM | POA: Diagnosis not present

## 2014-12-02 DIAGNOSIS — E119 Type 2 diabetes mellitus without complications: Secondary | ICD-10-CM | POA: Diagnosis not present

## 2014-12-02 DIAGNOSIS — D689 Coagulation defect, unspecified: Secondary | ICD-10-CM | POA: Diagnosis not present

## 2014-12-02 DIAGNOSIS — D631 Anemia in chronic kidney disease: Secondary | ICD-10-CM | POA: Diagnosis not present

## 2014-12-02 DIAGNOSIS — N186 End stage renal disease: Secondary | ICD-10-CM | POA: Diagnosis not present

## 2014-12-02 DIAGNOSIS — E1129 Type 2 diabetes mellitus with other diabetic kidney complication: Secondary | ICD-10-CM | POA: Diagnosis not present

## 2014-12-03 ENCOUNTER — Ambulatory Visit (HOSPITAL_COMMUNITY)
Admission: RE | Admit: 2014-12-03 | Discharge: 2014-12-03 | Disposition: A | Discharge status: Home / Self Care | Payer: Medicare Other | Source: Ambulatory Visit | Attending: Vascular Surgery | Admitting: Vascular Surgery

## 2014-12-03 ENCOUNTER — Other Ambulatory Visit: Payer: Self-pay | Admitting: Vascular Surgery

## 2014-12-03 DIAGNOSIS — I770 Arteriovenous fistula, acquired: Secondary | ICD-10-CM | POA: Insufficient documentation

## 2014-12-04 DIAGNOSIS — E1129 Type 2 diabetes mellitus with other diabetic kidney complication: Secondary | ICD-10-CM | POA: Diagnosis not present

## 2014-12-04 DIAGNOSIS — N186 End stage renal disease: Secondary | ICD-10-CM | POA: Diagnosis not present

## 2014-12-04 DIAGNOSIS — D689 Coagulation defect, unspecified: Secondary | ICD-10-CM | POA: Diagnosis not present

## 2014-12-04 DIAGNOSIS — D631 Anemia in chronic kidney disease: Secondary | ICD-10-CM | POA: Diagnosis not present

## 2014-12-04 DIAGNOSIS — E119 Type 2 diabetes mellitus without complications: Secondary | ICD-10-CM | POA: Diagnosis not present

## 2014-12-06 ENCOUNTER — Encounter: Payer: Self-pay | Admitting: Vascular Surgery

## 2014-12-07 DIAGNOSIS — E119 Type 2 diabetes mellitus without complications: Secondary | ICD-10-CM | POA: Diagnosis not present

## 2014-12-07 DIAGNOSIS — D689 Coagulation defect, unspecified: Secondary | ICD-10-CM | POA: Diagnosis not present

## 2014-12-07 DIAGNOSIS — D631 Anemia in chronic kidney disease: Secondary | ICD-10-CM | POA: Diagnosis not present

## 2014-12-07 DIAGNOSIS — E1129 Type 2 diabetes mellitus with other diabetic kidney complication: Secondary | ICD-10-CM | POA: Diagnosis not present

## 2014-12-07 DIAGNOSIS — N186 End stage renal disease: Secondary | ICD-10-CM | POA: Diagnosis not present

## 2014-12-08 ENCOUNTER — Ambulatory Visit (INDEPENDENT_AMBULATORY_CARE_PROVIDER_SITE_OTHER): Payer: Medicare Other | Admitting: Vascular Surgery

## 2014-12-08 ENCOUNTER — Encounter: Payer: Self-pay | Admitting: Vascular Surgery

## 2014-12-08 VITALS — BP 183/68 | HR 68 | Temp 98.4°F | Ht 70.0 in | Wt 216.7 lb

## 2014-12-08 DIAGNOSIS — Z992 Dependence on renal dialysis: Secondary | ICD-10-CM | POA: Diagnosis not present

## 2014-12-08 DIAGNOSIS — N186 End stage renal disease: Secondary | ICD-10-CM

## 2014-12-08 NOTE — Progress Notes (Signed)
History of Present Illness:  Patient is a [age] y.o. year old male who presents for evaluation of his left forearm graft.  He states it has been functioning for 7 years with one revision done by Dr. Edilia Boickson replacing the arterial half 09/11/2013.  The medial venous aneurysm is in question today and the risk of bleeding complications.  The patient states the aneurysm has been present for over 1 year and the HD nurses are avoiding sticking in that area.  He report no pain, numbness or bleeding problems.  Past Medical History  Diagnosis Date  . Diabetes mellitus   . Hypertension   . ESRD (end stage renal disease)     TuesThurs Sat [place]  . Coronary artery disease   . Anginal pain     before CABG  . Pneumonia     2 years ago  . Constipation   . Arthritis   . CHF (congestive heart failure)   . Myocardial infarction     Past Surgical History  Procedure Laterality Date  . Arteriovenous graft placement    . Coronary artery bypass graft    . Back surgery    . Laparoscopic colon resection      growth  . Revison of arteriovenous fistula Left 09/11/2013    Procedure: REVISON OF ARTERIOVENOUS FISTULA-LEFT ARM; POSSIBLE VENOPLASTY WITH INTEROPERATIVE FISTULOGRAM;  Surgeon: Chuck Hinthristopher S Dickson, MD;  [facility];  Service: Vascular;  Laterality: Left;     Social History History  Substance Use Topics  . Smoking status: Never Smoker   . Smokeless tobacco: Current User    Types: Snuff  . Alcohol Use: No    Family History Family History  Problem Relation Age of Onset  . Cancer Father   . Diabetes Sister     Allergies  Allergies  Allergen Reactions  . Iohexol Swelling and Rash     Current Outpatient Prescriptions  Medication Sig Dispense Refill  . allopurinol (ZYLOPRIM) 100 MG tablet Take 100 mg by mouth daily.    Marland Kitchen. aspirin EC 81 MG tablet Take 81 mg by mouth daily.    . cinacalcet (SENSIPAR) 30 MG tablet Take 60 mg by mouth daily.     Marland Kitchen. labetalol (NORMODYNE) 200  MG tablet Take 200 mg by mouth 2 (two) times daily.     . multivitamin (RENA-VIT) TABS tablet Take 1 tablet by mouth daily.    . traMADol (ULTRAM) 50 MG tablet Take 50 mg by mouth every 8 (eight) hours as needed for moderate pain.    . vitamin B-12 (CYANOCOBALAMIN) 250 MCG tablet Take 250 mcg by mouth daily.    Marland Kitchen. ipratropium (ATROVENT) 0.06 % nasal spray Place 1 spray into both nostrils daily.    Marland Kitchen. oxyCODONE-acetaminophen (PERCOCET/ROXICET) 5-325 MG per tablet Take 1 tablet by mouth 4 (four) times daily as needed for severe pain. (Patient not taking: Reported on 12/08/2014) 30 tablet 0   No current facility-administered medications for this visit.    ROS:   General:  No weight loss, Fever, chills  HEENT: No recent headaches, no nasal bleeding, no visual changes, no sore throat  Neurologic: No dizziness, blackouts, seizures. No recent symptoms of stroke or mini- stroke. No recent episodes of slurred speech, or temporary blindness.  Cardiac: No recent episodes of chest pain/pressure, no shortness of breath at rest.  No shortness of breath with exertion.  Denies history of atrial fibrillation or irregular heartbeat  Vascular: No history of rest pain in feet.  No history of claudication.  No history of non-healing ulcer, No history of DVT   Pulmonary: No home oxygen, no productive cough, no hemoptysis,  No asthma or wheezing  Musculoskeletal:   Arthritis,  Low back pain,   Joint pain  Hematologic:No history of hypercoagulable state.  No history of easy bleeding.  No history of anemia  Gastrointestinal: No hematochezia or melena,  No gastroesophageal reflux, no trouble swallowing  Urinary: [x ] chronic Kidney disease, [x ] on HD -  MWF or [x ] TTHS,  Burning with urination,  Frequent urination,  Difficulty urinating;   Skin: No rashes  Psychological: No history of anxiety,  No history of depression   Physical Examination  Filed Vitals:   12/08/14 1401 12/08/14  1405  BP: 170/76 183/68  Pulse: 68   Temp: 98.4 F (36.9 C)   TempSrc: Oral   Height:  (1.778 m)   Weight: 216 lb 11.2 oz (98.294 kg)   SpO2: 98%     Body mass index is 31.09 kg/(m^2).  General:  Alert and oriented, no acute distress HEENT: Normal Neck: No bruit or JVD Pulmonary: Clear to auscultation bilaterally Cardiac: Regular Rate and Rhythm without murmur Gastrointestinal: Soft, non-tender, non-distended, no mass, no scars Skin: No rash Extremity Pulses:  2+ radial, brachial pulses bilaterally.  Palpable thrill throughout the graft.  Soft compressible aneurysmal area on the venous half.  No ulcers note. Musculoskeletal: No deformity or edema  Neurologic: Upper and lower extremity motor 5/5 and symmetric     ASSESSMENT: ESRD   PLAN: He was given two options today. 1. He could continue HD and avoid sticking the graft near the aneurysm site.   2. We could schedule him for revision of the venous graft.   He decided to continue to avoid sticking the aneurysm and if he has any problems in the future he will call us.    Thomasena Edis, Ariahna Smiddy MAUREEN PA-C [facility]  The patient was seen in conjunction with Dr. Edilia Bo

## 2014-12-09 ENCOUNTER — Encounter: Payer: Self-pay | Admitting: Nephrology

## 2014-12-09 DIAGNOSIS — E119 Type 2 diabetes mellitus without complications: Secondary | ICD-10-CM | POA: Diagnosis not present

## 2014-12-09 DIAGNOSIS — N186 End stage renal disease: Secondary | ICD-10-CM | POA: Diagnosis not present

## 2014-12-09 DIAGNOSIS — D689 Coagulation defect, unspecified: Secondary | ICD-10-CM | POA: Diagnosis not present

## 2014-12-09 DIAGNOSIS — E1129 Type 2 diabetes mellitus with other diabetic kidney complication: Secondary | ICD-10-CM | POA: Diagnosis not present

## 2014-12-09 DIAGNOSIS — D631 Anemia in chronic kidney disease: Secondary | ICD-10-CM | POA: Diagnosis not present

## 2014-12-11 DIAGNOSIS — E119 Type 2 diabetes mellitus without complications: Secondary | ICD-10-CM | POA: Diagnosis not present

## 2014-12-11 DIAGNOSIS — E1129 Type 2 diabetes mellitus with other diabetic kidney complication: Secondary | ICD-10-CM | POA: Diagnosis not present

## 2014-12-11 DIAGNOSIS — N186 End stage renal disease: Secondary | ICD-10-CM | POA: Diagnosis not present

## 2014-12-11 DIAGNOSIS — D689 Coagulation defect, unspecified: Secondary | ICD-10-CM | POA: Diagnosis not present

## 2014-12-11 DIAGNOSIS — D631 Anemia in chronic kidney disease: Secondary | ICD-10-CM | POA: Diagnosis not present

## 2014-12-13 DIAGNOSIS — M549 Dorsalgia, unspecified: Secondary | ICD-10-CM | POA: Diagnosis not present

## 2014-12-13 DIAGNOSIS — I10 Essential (primary) hypertension: Secondary | ICD-10-CM | POA: Diagnosis not present

## 2014-12-13 DIAGNOSIS — J309 Allergic rhinitis, unspecified: Secondary | ICD-10-CM | POA: Diagnosis not present

## 2014-12-14 DIAGNOSIS — E1129 Type 2 diabetes mellitus with other diabetic kidney complication: Secondary | ICD-10-CM | POA: Diagnosis not present

## 2014-12-14 DIAGNOSIS — D689 Coagulation defect, unspecified: Secondary | ICD-10-CM | POA: Diagnosis not present

## 2014-12-14 DIAGNOSIS — E119 Type 2 diabetes mellitus without complications: Secondary | ICD-10-CM | POA: Diagnosis not present

## 2014-12-14 DIAGNOSIS — D631 Anemia in chronic kidney disease: Secondary | ICD-10-CM | POA: Diagnosis not present

## 2014-12-14 DIAGNOSIS — N186 End stage renal disease: Secondary | ICD-10-CM | POA: Diagnosis not present

## 2014-12-16 DIAGNOSIS — N186 End stage renal disease: Secondary | ICD-10-CM | POA: Diagnosis not present

## 2014-12-16 DIAGNOSIS — E1129 Type 2 diabetes mellitus with other diabetic kidney complication: Secondary | ICD-10-CM | POA: Diagnosis not present

## 2014-12-16 DIAGNOSIS — D631 Anemia in chronic kidney disease: Secondary | ICD-10-CM | POA: Diagnosis not present

## 2014-12-16 DIAGNOSIS — E119 Type 2 diabetes mellitus without complications: Secondary | ICD-10-CM | POA: Diagnosis not present

## 2014-12-16 DIAGNOSIS — D689 Coagulation defect, unspecified: Secondary | ICD-10-CM | POA: Diagnosis not present

## 2014-12-18 DIAGNOSIS — D689 Coagulation defect, unspecified: Secondary | ICD-10-CM | POA: Diagnosis not present

## 2014-12-18 DIAGNOSIS — E1129 Type 2 diabetes mellitus with other diabetic kidney complication: Secondary | ICD-10-CM | POA: Diagnosis not present

## 2014-12-18 DIAGNOSIS — E119 Type 2 diabetes mellitus without complications: Secondary | ICD-10-CM | POA: Diagnosis not present

## 2014-12-18 DIAGNOSIS — D631 Anemia in chronic kidney disease: Secondary | ICD-10-CM | POA: Diagnosis not present

## 2014-12-18 DIAGNOSIS — N186 End stage renal disease: Secondary | ICD-10-CM | POA: Diagnosis not present

## 2014-12-19 DIAGNOSIS — Z992 Dependence on renal dialysis: Secondary | ICD-10-CM | POA: Diagnosis not present

## 2014-12-19 DIAGNOSIS — E1129 Type 2 diabetes mellitus with other diabetic kidney complication: Secondary | ICD-10-CM | POA: Diagnosis not present

## 2014-12-19 DIAGNOSIS — N186 End stage renal disease: Secondary | ICD-10-CM | POA: Diagnosis not present

## 2014-12-21 DIAGNOSIS — N186 End stage renal disease: Secondary | ICD-10-CM | POA: Diagnosis not present

## 2014-12-21 DIAGNOSIS — E1129 Type 2 diabetes mellitus with other diabetic kidney complication: Secondary | ICD-10-CM | POA: Diagnosis not present

## 2014-12-21 DIAGNOSIS — D509 Iron deficiency anemia, unspecified: Secondary | ICD-10-CM | POA: Diagnosis not present

## 2014-12-21 DIAGNOSIS — E119 Type 2 diabetes mellitus without complications: Secondary | ICD-10-CM | POA: Diagnosis not present

## 2014-12-21 DIAGNOSIS — R52 Pain, unspecified: Secondary | ICD-10-CM | POA: Diagnosis not present

## 2014-12-21 DIAGNOSIS — D689 Coagulation defect, unspecified: Secondary | ICD-10-CM | POA: Diagnosis not present

## 2014-12-21 DIAGNOSIS — D631 Anemia in chronic kidney disease: Secondary | ICD-10-CM | POA: Diagnosis not present

## 2014-12-23 DIAGNOSIS — E1129 Type 2 diabetes mellitus with other diabetic kidney complication: Secondary | ICD-10-CM | POA: Diagnosis not present

## 2014-12-23 DIAGNOSIS — D689 Coagulation defect, unspecified: Secondary | ICD-10-CM | POA: Diagnosis not present

## 2014-12-23 DIAGNOSIS — D509 Iron deficiency anemia, unspecified: Secondary | ICD-10-CM | POA: Diagnosis not present

## 2014-12-23 DIAGNOSIS — R52 Pain, unspecified: Secondary | ICD-10-CM | POA: Diagnosis not present

## 2014-12-23 DIAGNOSIS — N186 End stage renal disease: Secondary | ICD-10-CM | POA: Diagnosis not present

## 2014-12-23 DIAGNOSIS — E119 Type 2 diabetes mellitus without complications: Secondary | ICD-10-CM | POA: Diagnosis not present

## 2014-12-23 DIAGNOSIS — D631 Anemia in chronic kidney disease: Secondary | ICD-10-CM | POA: Diagnosis not present

## 2014-12-25 DIAGNOSIS — D509 Iron deficiency anemia, unspecified: Secondary | ICD-10-CM | POA: Diagnosis not present

## 2014-12-25 DIAGNOSIS — N186 End stage renal disease: Secondary | ICD-10-CM | POA: Diagnosis not present

## 2014-12-25 DIAGNOSIS — D689 Coagulation defect, unspecified: Secondary | ICD-10-CM | POA: Diagnosis not present

## 2014-12-25 DIAGNOSIS — E119 Type 2 diabetes mellitus without complications: Secondary | ICD-10-CM | POA: Diagnosis not present

## 2014-12-25 DIAGNOSIS — D631 Anemia in chronic kidney disease: Secondary | ICD-10-CM | POA: Diagnosis not present

## 2014-12-25 DIAGNOSIS — E1129 Type 2 diabetes mellitus with other diabetic kidney complication: Secondary | ICD-10-CM | POA: Diagnosis not present

## 2014-12-25 DIAGNOSIS — R52 Pain, unspecified: Secondary | ICD-10-CM | POA: Diagnosis not present

## 2014-12-28 DIAGNOSIS — D689 Coagulation defect, unspecified: Secondary | ICD-10-CM | POA: Diagnosis not present

## 2014-12-28 DIAGNOSIS — E1129 Type 2 diabetes mellitus with other diabetic kidney complication: Secondary | ICD-10-CM | POA: Diagnosis not present

## 2014-12-28 DIAGNOSIS — N186 End stage renal disease: Secondary | ICD-10-CM | POA: Diagnosis not present

## 2014-12-28 DIAGNOSIS — E119 Type 2 diabetes mellitus without complications: Secondary | ICD-10-CM | POA: Diagnosis not present

## 2014-12-28 DIAGNOSIS — D509 Iron deficiency anemia, unspecified: Secondary | ICD-10-CM | POA: Diagnosis not present

## 2014-12-28 DIAGNOSIS — D631 Anemia in chronic kidney disease: Secondary | ICD-10-CM | POA: Diagnosis not present

## 2014-12-28 DIAGNOSIS — R52 Pain, unspecified: Secondary | ICD-10-CM | POA: Diagnosis not present

## 2014-12-30 DIAGNOSIS — N186 End stage renal disease: Secondary | ICD-10-CM | POA: Diagnosis not present

## 2014-12-30 DIAGNOSIS — D631 Anemia in chronic kidney disease: Secondary | ICD-10-CM | POA: Diagnosis not present

## 2014-12-30 DIAGNOSIS — E119 Type 2 diabetes mellitus without complications: Secondary | ICD-10-CM | POA: Diagnosis not present

## 2014-12-30 DIAGNOSIS — D689 Coagulation defect, unspecified: Secondary | ICD-10-CM | POA: Diagnosis not present

## 2014-12-30 DIAGNOSIS — E1129 Type 2 diabetes mellitus with other diabetic kidney complication: Secondary | ICD-10-CM | POA: Diagnosis not present

## 2014-12-30 DIAGNOSIS — R52 Pain, unspecified: Secondary | ICD-10-CM | POA: Diagnosis not present

## 2014-12-30 DIAGNOSIS — D509 Iron deficiency anemia, unspecified: Secondary | ICD-10-CM | POA: Diagnosis not present

## 2015-01-01 DIAGNOSIS — E1129 Type 2 diabetes mellitus with other diabetic kidney complication: Secondary | ICD-10-CM | POA: Diagnosis not present

## 2015-01-01 DIAGNOSIS — R52 Pain, unspecified: Secondary | ICD-10-CM | POA: Diagnosis not present

## 2015-01-01 DIAGNOSIS — D631 Anemia in chronic kidney disease: Secondary | ICD-10-CM | POA: Diagnosis not present

## 2015-01-01 DIAGNOSIS — N186 End stage renal disease: Secondary | ICD-10-CM | POA: Diagnosis not present

## 2015-01-01 DIAGNOSIS — D689 Coagulation defect, unspecified: Secondary | ICD-10-CM | POA: Diagnosis not present

## 2015-01-01 DIAGNOSIS — D509 Iron deficiency anemia, unspecified: Secondary | ICD-10-CM | POA: Diagnosis not present

## 2015-01-01 DIAGNOSIS — E119 Type 2 diabetes mellitus without complications: Secondary | ICD-10-CM | POA: Diagnosis not present

## 2015-01-04 DIAGNOSIS — E1129 Type 2 diabetes mellitus with other diabetic kidney complication: Secondary | ICD-10-CM | POA: Diagnosis not present

## 2015-01-04 DIAGNOSIS — R52 Pain, unspecified: Secondary | ICD-10-CM | POA: Diagnosis not present

## 2015-01-04 DIAGNOSIS — D689 Coagulation defect, unspecified: Secondary | ICD-10-CM | POA: Diagnosis not present

## 2015-01-04 DIAGNOSIS — E119 Type 2 diabetes mellitus without complications: Secondary | ICD-10-CM | POA: Diagnosis not present

## 2015-01-04 DIAGNOSIS — N186 End stage renal disease: Secondary | ICD-10-CM | POA: Diagnosis not present

## 2015-01-04 DIAGNOSIS — D509 Iron deficiency anemia, unspecified: Secondary | ICD-10-CM | POA: Diagnosis not present

## 2015-01-04 DIAGNOSIS — D631 Anemia in chronic kidney disease: Secondary | ICD-10-CM | POA: Diagnosis not present

## 2015-01-05 DIAGNOSIS — N390 Urinary tract infection, site not specified: Secondary | ICD-10-CM | POA: Diagnosis not present

## 2015-01-05 DIAGNOSIS — R319 Hematuria, unspecified: Secondary | ICD-10-CM | POA: Diagnosis not present

## 2015-01-05 DIAGNOSIS — R300 Dysuria: Secondary | ICD-10-CM | POA: Diagnosis not present

## 2015-01-06 DIAGNOSIS — N186 End stage renal disease: Secondary | ICD-10-CM | POA: Diagnosis not present

## 2015-01-06 DIAGNOSIS — D509 Iron deficiency anemia, unspecified: Secondary | ICD-10-CM | POA: Diagnosis not present

## 2015-01-06 DIAGNOSIS — E1129 Type 2 diabetes mellitus with other diabetic kidney complication: Secondary | ICD-10-CM | POA: Diagnosis not present

## 2015-01-06 DIAGNOSIS — D689 Coagulation defect, unspecified: Secondary | ICD-10-CM | POA: Diagnosis not present

## 2015-01-06 DIAGNOSIS — E119 Type 2 diabetes mellitus without complications: Secondary | ICD-10-CM | POA: Diagnosis not present

## 2015-01-06 DIAGNOSIS — D631 Anemia in chronic kidney disease: Secondary | ICD-10-CM | POA: Diagnosis not present

## 2015-01-06 DIAGNOSIS — R52 Pain, unspecified: Secondary | ICD-10-CM | POA: Diagnosis not present

## 2015-01-08 DIAGNOSIS — D689 Coagulation defect, unspecified: Secondary | ICD-10-CM | POA: Diagnosis not present

## 2015-01-08 DIAGNOSIS — N186 End stage renal disease: Secondary | ICD-10-CM | POA: Diagnosis not present

## 2015-01-08 DIAGNOSIS — D509 Iron deficiency anemia, unspecified: Secondary | ICD-10-CM | POA: Diagnosis not present

## 2015-01-08 DIAGNOSIS — E1129 Type 2 diabetes mellitus with other diabetic kidney complication: Secondary | ICD-10-CM | POA: Diagnosis not present

## 2015-01-08 DIAGNOSIS — D631 Anemia in chronic kidney disease: Secondary | ICD-10-CM | POA: Diagnosis not present

## 2015-01-08 DIAGNOSIS — R52 Pain, unspecified: Secondary | ICD-10-CM | POA: Diagnosis not present

## 2015-01-08 DIAGNOSIS — E119 Type 2 diabetes mellitus without complications: Secondary | ICD-10-CM | POA: Diagnosis not present

## 2015-01-11 DIAGNOSIS — N186 End stage renal disease: Secondary | ICD-10-CM | POA: Diagnosis not present

## 2015-01-11 DIAGNOSIS — D689 Coagulation defect, unspecified: Secondary | ICD-10-CM | POA: Diagnosis not present

## 2015-01-11 DIAGNOSIS — D631 Anemia in chronic kidney disease: Secondary | ICD-10-CM | POA: Diagnosis not present

## 2015-01-11 DIAGNOSIS — R52 Pain, unspecified: Secondary | ICD-10-CM | POA: Diagnosis not present

## 2015-01-11 DIAGNOSIS — D509 Iron deficiency anemia, unspecified: Secondary | ICD-10-CM | POA: Diagnosis not present

## 2015-01-11 DIAGNOSIS — E119 Type 2 diabetes mellitus without complications: Secondary | ICD-10-CM | POA: Diagnosis not present

## 2015-01-11 DIAGNOSIS — E1129 Type 2 diabetes mellitus with other diabetic kidney complication: Secondary | ICD-10-CM | POA: Diagnosis not present

## 2015-01-13 DIAGNOSIS — N186 End stage renal disease: Secondary | ICD-10-CM | POA: Diagnosis not present

## 2015-01-13 DIAGNOSIS — E119 Type 2 diabetes mellitus without complications: Secondary | ICD-10-CM | POA: Diagnosis not present

## 2015-01-13 DIAGNOSIS — D509 Iron deficiency anemia, unspecified: Secondary | ICD-10-CM | POA: Diagnosis not present

## 2015-01-13 DIAGNOSIS — D689 Coagulation defect, unspecified: Secondary | ICD-10-CM | POA: Diagnosis not present

## 2015-01-13 DIAGNOSIS — D631 Anemia in chronic kidney disease: Secondary | ICD-10-CM | POA: Diagnosis not present

## 2015-01-13 DIAGNOSIS — E1129 Type 2 diabetes mellitus with other diabetic kidney complication: Secondary | ICD-10-CM | POA: Diagnosis not present

## 2015-01-13 DIAGNOSIS — R52 Pain, unspecified: Secondary | ICD-10-CM | POA: Diagnosis not present

## 2015-01-15 DIAGNOSIS — D689 Coagulation defect, unspecified: Secondary | ICD-10-CM | POA: Diagnosis not present

## 2015-01-15 DIAGNOSIS — R52 Pain, unspecified: Secondary | ICD-10-CM | POA: Diagnosis not present

## 2015-01-15 DIAGNOSIS — D631 Anemia in chronic kidney disease: Secondary | ICD-10-CM | POA: Diagnosis not present

## 2015-01-15 DIAGNOSIS — E119 Type 2 diabetes mellitus without complications: Secondary | ICD-10-CM | POA: Diagnosis not present

## 2015-01-15 DIAGNOSIS — N186 End stage renal disease: Secondary | ICD-10-CM | POA: Diagnosis not present

## 2015-01-15 DIAGNOSIS — E1129 Type 2 diabetes mellitus with other diabetic kidney complication: Secondary | ICD-10-CM | POA: Diagnosis not present

## 2015-01-15 DIAGNOSIS — D509 Iron deficiency anemia, unspecified: Secondary | ICD-10-CM | POA: Diagnosis not present

## 2015-01-18 DIAGNOSIS — D631 Anemia in chronic kidney disease: Secondary | ICD-10-CM | POA: Diagnosis not present

## 2015-01-18 DIAGNOSIS — D689 Coagulation defect, unspecified: Secondary | ICD-10-CM | POA: Diagnosis not present

## 2015-01-18 DIAGNOSIS — D509 Iron deficiency anemia, unspecified: Secondary | ICD-10-CM | POA: Diagnosis not present

## 2015-01-18 DIAGNOSIS — E119 Type 2 diabetes mellitus without complications: Secondary | ICD-10-CM | POA: Diagnosis not present

## 2015-01-18 DIAGNOSIS — E1129 Type 2 diabetes mellitus with other diabetic kidney complication: Secondary | ICD-10-CM | POA: Diagnosis not present

## 2015-01-18 DIAGNOSIS — R52 Pain, unspecified: Secondary | ICD-10-CM | POA: Diagnosis not present

## 2015-01-18 DIAGNOSIS — N186 End stage renal disease: Secondary | ICD-10-CM | POA: Diagnosis not present

## 2015-01-19 DIAGNOSIS — Z992 Dependence on renal dialysis: Secondary | ICD-10-CM | POA: Diagnosis not present

## 2015-01-19 DIAGNOSIS — E1129 Type 2 diabetes mellitus with other diabetic kidney complication: Secondary | ICD-10-CM | POA: Diagnosis not present

## 2015-01-19 DIAGNOSIS — N186 End stage renal disease: Secondary | ICD-10-CM | POA: Diagnosis not present

## 2015-01-20 DIAGNOSIS — D509 Iron deficiency anemia, unspecified: Secondary | ICD-10-CM | POA: Diagnosis not present

## 2015-01-20 DIAGNOSIS — E119 Type 2 diabetes mellitus without complications: Secondary | ICD-10-CM | POA: Diagnosis not present

## 2015-01-20 DIAGNOSIS — R52 Pain, unspecified: Secondary | ICD-10-CM | POA: Diagnosis not present

## 2015-01-20 DIAGNOSIS — N186 End stage renal disease: Secondary | ICD-10-CM | POA: Diagnosis not present

## 2015-01-20 DIAGNOSIS — E1129 Type 2 diabetes mellitus with other diabetic kidney complication: Secondary | ICD-10-CM | POA: Diagnosis not present

## 2015-01-20 DIAGNOSIS — D689 Coagulation defect, unspecified: Secondary | ICD-10-CM | POA: Diagnosis not present

## 2015-01-20 DIAGNOSIS — D631 Anemia in chronic kidney disease: Secondary | ICD-10-CM | POA: Diagnosis not present

## 2015-01-22 DIAGNOSIS — D509 Iron deficiency anemia, unspecified: Secondary | ICD-10-CM | POA: Diagnosis not present

## 2015-01-22 DIAGNOSIS — E1129 Type 2 diabetes mellitus with other diabetic kidney complication: Secondary | ICD-10-CM | POA: Diagnosis not present

## 2015-01-22 DIAGNOSIS — D689 Coagulation defect, unspecified: Secondary | ICD-10-CM | POA: Diagnosis not present

## 2015-01-22 DIAGNOSIS — D631 Anemia in chronic kidney disease: Secondary | ICD-10-CM | POA: Diagnosis not present

## 2015-01-22 DIAGNOSIS — R52 Pain, unspecified: Secondary | ICD-10-CM | POA: Diagnosis not present

## 2015-01-22 DIAGNOSIS — E119 Type 2 diabetes mellitus without complications: Secondary | ICD-10-CM | POA: Diagnosis not present

## 2015-01-22 DIAGNOSIS — N186 End stage renal disease: Secondary | ICD-10-CM | POA: Diagnosis not present

## 2015-01-25 DIAGNOSIS — D689 Coagulation defect, unspecified: Secondary | ICD-10-CM | POA: Diagnosis not present

## 2015-01-25 DIAGNOSIS — D509 Iron deficiency anemia, unspecified: Secondary | ICD-10-CM | POA: Diagnosis not present

## 2015-01-25 DIAGNOSIS — D631 Anemia in chronic kidney disease: Secondary | ICD-10-CM | POA: Diagnosis not present

## 2015-01-25 DIAGNOSIS — E119 Type 2 diabetes mellitus without complications: Secondary | ICD-10-CM | POA: Diagnosis not present

## 2015-01-25 DIAGNOSIS — N186 End stage renal disease: Secondary | ICD-10-CM | POA: Diagnosis not present

## 2015-01-25 DIAGNOSIS — R52 Pain, unspecified: Secondary | ICD-10-CM | POA: Diagnosis not present

## 2015-01-25 DIAGNOSIS — E1129 Type 2 diabetes mellitus with other diabetic kidney complication: Secondary | ICD-10-CM | POA: Diagnosis not present

## 2015-01-27 DIAGNOSIS — E119 Type 2 diabetes mellitus without complications: Secondary | ICD-10-CM | POA: Diagnosis not present

## 2015-01-27 DIAGNOSIS — N186 End stage renal disease: Secondary | ICD-10-CM | POA: Diagnosis not present

## 2015-01-27 DIAGNOSIS — D631 Anemia in chronic kidney disease: Secondary | ICD-10-CM | POA: Diagnosis not present

## 2015-01-27 DIAGNOSIS — D689 Coagulation defect, unspecified: Secondary | ICD-10-CM | POA: Diagnosis not present

## 2015-01-27 DIAGNOSIS — E1129 Type 2 diabetes mellitus with other diabetic kidney complication: Secondary | ICD-10-CM | POA: Diagnosis not present

## 2015-01-27 DIAGNOSIS — D509 Iron deficiency anemia, unspecified: Secondary | ICD-10-CM | POA: Diagnosis not present

## 2015-01-27 DIAGNOSIS — R52 Pain, unspecified: Secondary | ICD-10-CM | POA: Diagnosis not present

## 2015-01-29 DIAGNOSIS — E119 Type 2 diabetes mellitus without complications: Secondary | ICD-10-CM | POA: Diagnosis not present

## 2015-01-29 DIAGNOSIS — E1129 Type 2 diabetes mellitus with other diabetic kidney complication: Secondary | ICD-10-CM | POA: Diagnosis not present

## 2015-01-29 DIAGNOSIS — R52 Pain, unspecified: Secondary | ICD-10-CM | POA: Diagnosis not present

## 2015-01-29 DIAGNOSIS — D689 Coagulation defect, unspecified: Secondary | ICD-10-CM | POA: Diagnosis not present

## 2015-01-29 DIAGNOSIS — D509 Iron deficiency anemia, unspecified: Secondary | ICD-10-CM | POA: Diagnosis not present

## 2015-01-29 DIAGNOSIS — D631 Anemia in chronic kidney disease: Secondary | ICD-10-CM | POA: Diagnosis not present

## 2015-01-29 DIAGNOSIS — N186 End stage renal disease: Secondary | ICD-10-CM | POA: Diagnosis not present

## 2015-02-01 DIAGNOSIS — D689 Coagulation defect, unspecified: Secondary | ICD-10-CM | POA: Diagnosis not present

## 2015-02-01 DIAGNOSIS — D631 Anemia in chronic kidney disease: Secondary | ICD-10-CM | POA: Diagnosis not present

## 2015-02-01 DIAGNOSIS — N186 End stage renal disease: Secondary | ICD-10-CM | POA: Diagnosis not present

## 2015-02-01 DIAGNOSIS — E1129 Type 2 diabetes mellitus with other diabetic kidney complication: Secondary | ICD-10-CM | POA: Diagnosis not present

## 2015-02-01 DIAGNOSIS — E119 Type 2 diabetes mellitus without complications: Secondary | ICD-10-CM | POA: Diagnosis not present

## 2015-02-01 DIAGNOSIS — D509 Iron deficiency anemia, unspecified: Secondary | ICD-10-CM | POA: Diagnosis not present

## 2015-02-01 DIAGNOSIS — R52 Pain, unspecified: Secondary | ICD-10-CM | POA: Diagnosis not present

## 2015-02-03 DIAGNOSIS — D509 Iron deficiency anemia, unspecified: Secondary | ICD-10-CM | POA: Diagnosis not present

## 2015-02-03 DIAGNOSIS — R52 Pain, unspecified: Secondary | ICD-10-CM | POA: Diagnosis not present

## 2015-02-03 DIAGNOSIS — E1129 Type 2 diabetes mellitus with other diabetic kidney complication: Secondary | ICD-10-CM | POA: Diagnosis not present

## 2015-02-03 DIAGNOSIS — E119 Type 2 diabetes mellitus without complications: Secondary | ICD-10-CM | POA: Diagnosis not present

## 2015-02-03 DIAGNOSIS — D631 Anemia in chronic kidney disease: Secondary | ICD-10-CM | POA: Diagnosis not present

## 2015-02-03 DIAGNOSIS — D689 Coagulation defect, unspecified: Secondary | ICD-10-CM | POA: Diagnosis not present

## 2015-02-03 DIAGNOSIS — N186 End stage renal disease: Secondary | ICD-10-CM | POA: Diagnosis not present

## 2015-02-04 DIAGNOSIS — E11319 Type 2 diabetes mellitus with unspecified diabetic retinopathy without macular edema: Secondary | ICD-10-CM | POA: Diagnosis not present

## 2015-02-05 DIAGNOSIS — R52 Pain, unspecified: Secondary | ICD-10-CM | POA: Diagnosis not present

## 2015-02-05 DIAGNOSIS — D509 Iron deficiency anemia, unspecified: Secondary | ICD-10-CM | POA: Diagnosis not present

## 2015-02-05 DIAGNOSIS — E1129 Type 2 diabetes mellitus with other diabetic kidney complication: Secondary | ICD-10-CM | POA: Diagnosis not present

## 2015-02-05 DIAGNOSIS — D689 Coagulation defect, unspecified: Secondary | ICD-10-CM | POA: Diagnosis not present

## 2015-02-05 DIAGNOSIS — N186 End stage renal disease: Secondary | ICD-10-CM | POA: Diagnosis not present

## 2015-02-05 DIAGNOSIS — E119 Type 2 diabetes mellitus without complications: Secondary | ICD-10-CM | POA: Diagnosis not present

## 2015-02-05 DIAGNOSIS — D631 Anemia in chronic kidney disease: Secondary | ICD-10-CM | POA: Diagnosis not present

## 2015-02-08 DIAGNOSIS — D509 Iron deficiency anemia, unspecified: Secondary | ICD-10-CM | POA: Diagnosis not present

## 2015-02-08 DIAGNOSIS — R52 Pain, unspecified: Secondary | ICD-10-CM | POA: Diagnosis not present

## 2015-02-08 DIAGNOSIS — E119 Type 2 diabetes mellitus without complications: Secondary | ICD-10-CM | POA: Diagnosis not present

## 2015-02-08 DIAGNOSIS — D631 Anemia in chronic kidney disease: Secondary | ICD-10-CM | POA: Diagnosis not present

## 2015-02-08 DIAGNOSIS — D689 Coagulation defect, unspecified: Secondary | ICD-10-CM | POA: Diagnosis not present

## 2015-02-08 DIAGNOSIS — E1129 Type 2 diabetes mellitus with other diabetic kidney complication: Secondary | ICD-10-CM | POA: Diagnosis not present

## 2015-02-08 DIAGNOSIS — N186 End stage renal disease: Secondary | ICD-10-CM | POA: Diagnosis not present

## 2015-02-10 DIAGNOSIS — E119 Type 2 diabetes mellitus without complications: Secondary | ICD-10-CM | POA: Diagnosis not present

## 2015-02-10 DIAGNOSIS — R52 Pain, unspecified: Secondary | ICD-10-CM | POA: Diagnosis not present

## 2015-02-10 DIAGNOSIS — D509 Iron deficiency anemia, unspecified: Secondary | ICD-10-CM | POA: Diagnosis not present

## 2015-02-10 DIAGNOSIS — E1129 Type 2 diabetes mellitus with other diabetic kidney complication: Secondary | ICD-10-CM | POA: Diagnosis not present

## 2015-02-10 DIAGNOSIS — D631 Anemia in chronic kidney disease: Secondary | ICD-10-CM | POA: Diagnosis not present

## 2015-02-10 DIAGNOSIS — N186 End stage renal disease: Secondary | ICD-10-CM | POA: Diagnosis not present

## 2015-02-10 DIAGNOSIS — D689 Coagulation defect, unspecified: Secondary | ICD-10-CM | POA: Diagnosis not present

## 2015-02-12 DIAGNOSIS — E1129 Type 2 diabetes mellitus with other diabetic kidney complication: Secondary | ICD-10-CM | POA: Diagnosis not present

## 2015-02-12 DIAGNOSIS — D509 Iron deficiency anemia, unspecified: Secondary | ICD-10-CM | POA: Diagnosis not present

## 2015-02-12 DIAGNOSIS — D631 Anemia in chronic kidney disease: Secondary | ICD-10-CM | POA: Diagnosis not present

## 2015-02-12 DIAGNOSIS — D689 Coagulation defect, unspecified: Secondary | ICD-10-CM | POA: Diagnosis not present

## 2015-02-12 DIAGNOSIS — N186 End stage renal disease: Secondary | ICD-10-CM | POA: Diagnosis not present

## 2015-02-12 DIAGNOSIS — E119 Type 2 diabetes mellitus without complications: Secondary | ICD-10-CM | POA: Diagnosis not present

## 2015-02-12 DIAGNOSIS — R52 Pain, unspecified: Secondary | ICD-10-CM | POA: Diagnosis not present

## 2015-02-15 DIAGNOSIS — E1129 Type 2 diabetes mellitus with other diabetic kidney complication: Secondary | ICD-10-CM | POA: Diagnosis not present

## 2015-02-15 DIAGNOSIS — D631 Anemia in chronic kidney disease: Secondary | ICD-10-CM | POA: Diagnosis not present

## 2015-02-15 DIAGNOSIS — D509 Iron deficiency anemia, unspecified: Secondary | ICD-10-CM | POA: Diagnosis not present

## 2015-02-15 DIAGNOSIS — N186 End stage renal disease: Secondary | ICD-10-CM | POA: Diagnosis not present

## 2015-02-15 DIAGNOSIS — R52 Pain, unspecified: Secondary | ICD-10-CM | POA: Diagnosis not present

## 2015-02-15 DIAGNOSIS — D689 Coagulation defect, unspecified: Secondary | ICD-10-CM | POA: Diagnosis not present

## 2015-02-15 DIAGNOSIS — E119 Type 2 diabetes mellitus without complications: Secondary | ICD-10-CM | POA: Diagnosis not present

## 2015-02-17 DIAGNOSIS — D689 Coagulation defect, unspecified: Secondary | ICD-10-CM | POA: Diagnosis not present

## 2015-02-17 DIAGNOSIS — D631 Anemia in chronic kidney disease: Secondary | ICD-10-CM | POA: Diagnosis not present

## 2015-02-17 DIAGNOSIS — R52 Pain, unspecified: Secondary | ICD-10-CM | POA: Diagnosis not present

## 2015-02-17 DIAGNOSIS — E119 Type 2 diabetes mellitus without complications: Secondary | ICD-10-CM | POA: Diagnosis not present

## 2015-02-17 DIAGNOSIS — E1129 Type 2 diabetes mellitus with other diabetic kidney complication: Secondary | ICD-10-CM | POA: Diagnosis not present

## 2015-02-17 DIAGNOSIS — D509 Iron deficiency anemia, unspecified: Secondary | ICD-10-CM | POA: Diagnosis not present

## 2015-02-17 DIAGNOSIS — N186 End stage renal disease: Secondary | ICD-10-CM | POA: Diagnosis not present

## 2015-02-18 DIAGNOSIS — E1129 Type 2 diabetes mellitus with other diabetic kidney complication: Secondary | ICD-10-CM | POA: Diagnosis not present

## 2015-02-18 DIAGNOSIS — R319 Hematuria, unspecified: Secondary | ICD-10-CM | POA: Diagnosis not present

## 2015-02-18 DIAGNOSIS — J309 Allergic rhinitis, unspecified: Secondary | ICD-10-CM | POA: Diagnosis not present

## 2015-02-18 DIAGNOSIS — N186 End stage renal disease: Secondary | ICD-10-CM | POA: Diagnosis not present

## 2015-02-18 DIAGNOSIS — I10 Essential (primary) hypertension: Secondary | ICD-10-CM | POA: Diagnosis not present

## 2015-02-18 DIAGNOSIS — Z992 Dependence on renal dialysis: Secondary | ICD-10-CM | POA: Diagnosis not present

## 2015-02-18 DIAGNOSIS — M549 Dorsalgia, unspecified: Secondary | ICD-10-CM | POA: Diagnosis not present

## 2015-02-19 DIAGNOSIS — D631 Anemia in chronic kidney disease: Secondary | ICD-10-CM | POA: Diagnosis not present

## 2015-02-19 DIAGNOSIS — Z23 Encounter for immunization: Secondary | ICD-10-CM | POA: Diagnosis not present

## 2015-02-19 DIAGNOSIS — D509 Iron deficiency anemia, unspecified: Secondary | ICD-10-CM | POA: Diagnosis not present

## 2015-02-19 DIAGNOSIS — D689 Coagulation defect, unspecified: Secondary | ICD-10-CM | POA: Diagnosis not present

## 2015-02-19 DIAGNOSIS — E1129 Type 2 diabetes mellitus with other diabetic kidney complication: Secondary | ICD-10-CM | POA: Diagnosis not present

## 2015-02-19 DIAGNOSIS — E119 Type 2 diabetes mellitus without complications: Secondary | ICD-10-CM | POA: Diagnosis not present

## 2015-02-19 DIAGNOSIS — N186 End stage renal disease: Secondary | ICD-10-CM | POA: Diagnosis not present

## 2015-02-21 DIAGNOSIS — J00 Acute nasopharyngitis [common cold]: Secondary | ICD-10-CM | POA: Diagnosis not present

## 2015-02-21 DIAGNOSIS — J209 Acute bronchitis, unspecified: Secondary | ICD-10-CM | POA: Diagnosis not present

## 2015-02-22 DIAGNOSIS — Z23 Encounter for immunization: Secondary | ICD-10-CM | POA: Diagnosis not present

## 2015-02-22 DIAGNOSIS — E119 Type 2 diabetes mellitus without complications: Secondary | ICD-10-CM | POA: Diagnosis not present

## 2015-02-22 DIAGNOSIS — D509 Iron deficiency anemia, unspecified: Secondary | ICD-10-CM | POA: Diagnosis not present

## 2015-02-22 DIAGNOSIS — N186 End stage renal disease: Secondary | ICD-10-CM | POA: Diagnosis not present

## 2015-02-22 DIAGNOSIS — E1129 Type 2 diabetes mellitus with other diabetic kidney complication: Secondary | ICD-10-CM | POA: Diagnosis not present

## 2015-02-22 DIAGNOSIS — D631 Anemia in chronic kidney disease: Secondary | ICD-10-CM | POA: Diagnosis not present

## 2015-02-22 DIAGNOSIS — D689 Coagulation defect, unspecified: Secondary | ICD-10-CM | POA: Diagnosis not present

## 2015-02-24 DIAGNOSIS — E1129 Type 2 diabetes mellitus with other diabetic kidney complication: Secondary | ICD-10-CM | POA: Diagnosis not present

## 2015-02-24 DIAGNOSIS — D631 Anemia in chronic kidney disease: Secondary | ICD-10-CM | POA: Diagnosis not present

## 2015-02-24 DIAGNOSIS — D689 Coagulation defect, unspecified: Secondary | ICD-10-CM | POA: Diagnosis not present

## 2015-02-24 DIAGNOSIS — Z23 Encounter for immunization: Secondary | ICD-10-CM | POA: Diagnosis not present

## 2015-02-24 DIAGNOSIS — E119 Type 2 diabetes mellitus without complications: Secondary | ICD-10-CM | POA: Diagnosis not present

## 2015-02-24 DIAGNOSIS — N186 End stage renal disease: Secondary | ICD-10-CM | POA: Diagnosis not present

## 2015-02-24 DIAGNOSIS — D509 Iron deficiency anemia, unspecified: Secondary | ICD-10-CM | POA: Diagnosis not present

## 2015-02-26 DIAGNOSIS — E119 Type 2 diabetes mellitus without complications: Secondary | ICD-10-CM | POA: Diagnosis not present

## 2015-02-26 DIAGNOSIS — D509 Iron deficiency anemia, unspecified: Secondary | ICD-10-CM | POA: Diagnosis not present

## 2015-02-26 DIAGNOSIS — E1129 Type 2 diabetes mellitus with other diabetic kidney complication: Secondary | ICD-10-CM | POA: Diagnosis not present

## 2015-02-26 DIAGNOSIS — D689 Coagulation defect, unspecified: Secondary | ICD-10-CM | POA: Diagnosis not present

## 2015-02-26 DIAGNOSIS — Z23 Encounter for immunization: Secondary | ICD-10-CM | POA: Diagnosis not present

## 2015-02-26 DIAGNOSIS — N186 End stage renal disease: Secondary | ICD-10-CM | POA: Diagnosis not present

## 2015-02-26 DIAGNOSIS — D631 Anemia in chronic kidney disease: Secondary | ICD-10-CM | POA: Diagnosis not present

## 2015-03-01 DIAGNOSIS — D631 Anemia in chronic kidney disease: Secondary | ICD-10-CM | POA: Diagnosis not present

## 2015-03-01 DIAGNOSIS — E119 Type 2 diabetes mellitus without complications: Secondary | ICD-10-CM | POA: Diagnosis not present

## 2015-03-01 DIAGNOSIS — E1129 Type 2 diabetes mellitus with other diabetic kidney complication: Secondary | ICD-10-CM | POA: Diagnosis not present

## 2015-03-01 DIAGNOSIS — N401 Enlarged prostate with lower urinary tract symptoms: Secondary | ICD-10-CM | POA: Diagnosis not present

## 2015-03-01 DIAGNOSIS — D509 Iron deficiency anemia, unspecified: Secondary | ICD-10-CM | POA: Diagnosis not present

## 2015-03-01 DIAGNOSIS — Z23 Encounter for immunization: Secondary | ICD-10-CM | POA: Diagnosis not present

## 2015-03-01 DIAGNOSIS — N186 End stage renal disease: Secondary | ICD-10-CM | POA: Diagnosis not present

## 2015-03-01 DIAGNOSIS — D689 Coagulation defect, unspecified: Secondary | ICD-10-CM | POA: Diagnosis not present

## 2015-03-01 DIAGNOSIS — R310 Gross hematuria: Secondary | ICD-10-CM | POA: Diagnosis not present

## 2015-03-03 DIAGNOSIS — E119 Type 2 diabetes mellitus without complications: Secondary | ICD-10-CM | POA: Diagnosis not present

## 2015-03-03 DIAGNOSIS — N186 End stage renal disease: Secondary | ICD-10-CM | POA: Diagnosis not present

## 2015-03-03 DIAGNOSIS — D631 Anemia in chronic kidney disease: Secondary | ICD-10-CM | POA: Diagnosis not present

## 2015-03-03 DIAGNOSIS — D509 Iron deficiency anemia, unspecified: Secondary | ICD-10-CM | POA: Diagnosis not present

## 2015-03-03 DIAGNOSIS — Z23 Encounter for immunization: Secondary | ICD-10-CM | POA: Diagnosis not present

## 2015-03-03 DIAGNOSIS — E1129 Type 2 diabetes mellitus with other diabetic kidney complication: Secondary | ICD-10-CM | POA: Diagnosis not present

## 2015-03-03 DIAGNOSIS — D689 Coagulation defect, unspecified: Secondary | ICD-10-CM | POA: Diagnosis not present

## 2015-03-05 DIAGNOSIS — E119 Type 2 diabetes mellitus without complications: Secondary | ICD-10-CM | POA: Diagnosis not present

## 2015-03-05 DIAGNOSIS — E1129 Type 2 diabetes mellitus with other diabetic kidney complication: Secondary | ICD-10-CM | POA: Diagnosis not present

## 2015-03-05 DIAGNOSIS — N186 End stage renal disease: Secondary | ICD-10-CM | POA: Diagnosis not present

## 2015-03-05 DIAGNOSIS — Z23 Encounter for immunization: Secondary | ICD-10-CM | POA: Diagnosis not present

## 2015-03-05 DIAGNOSIS — D631 Anemia in chronic kidney disease: Secondary | ICD-10-CM | POA: Diagnosis not present

## 2015-03-05 DIAGNOSIS — D509 Iron deficiency anemia, unspecified: Secondary | ICD-10-CM | POA: Diagnosis not present

## 2015-03-05 DIAGNOSIS — D689 Coagulation defect, unspecified: Secondary | ICD-10-CM | POA: Diagnosis not present

## 2015-03-08 DIAGNOSIS — D509 Iron deficiency anemia, unspecified: Secondary | ICD-10-CM | POA: Diagnosis not present

## 2015-03-08 DIAGNOSIS — E1129 Type 2 diabetes mellitus with other diabetic kidney complication: Secondary | ICD-10-CM | POA: Diagnosis not present

## 2015-03-08 DIAGNOSIS — N186 End stage renal disease: Secondary | ICD-10-CM | POA: Diagnosis not present

## 2015-03-08 DIAGNOSIS — D631 Anemia in chronic kidney disease: Secondary | ICD-10-CM | POA: Diagnosis not present

## 2015-03-08 DIAGNOSIS — E119 Type 2 diabetes mellitus without complications: Secondary | ICD-10-CM | POA: Diagnosis not present

## 2015-03-08 DIAGNOSIS — Z23 Encounter for immunization: Secondary | ICD-10-CM | POA: Diagnosis not present

## 2015-03-08 DIAGNOSIS — D689 Coagulation defect, unspecified: Secondary | ICD-10-CM | POA: Diagnosis not present

## 2015-03-09 DIAGNOSIS — R310 Gross hematuria: Secondary | ICD-10-CM | POA: Diagnosis not present

## 2015-03-10 DIAGNOSIS — E119 Type 2 diabetes mellitus without complications: Secondary | ICD-10-CM | POA: Diagnosis not present

## 2015-03-10 DIAGNOSIS — D509 Iron deficiency anemia, unspecified: Secondary | ICD-10-CM | POA: Diagnosis not present

## 2015-03-10 DIAGNOSIS — E1129 Type 2 diabetes mellitus with other diabetic kidney complication: Secondary | ICD-10-CM | POA: Diagnosis not present

## 2015-03-10 DIAGNOSIS — D631 Anemia in chronic kidney disease: Secondary | ICD-10-CM | POA: Diagnosis not present

## 2015-03-10 DIAGNOSIS — Z23 Encounter for immunization: Secondary | ICD-10-CM | POA: Diagnosis not present

## 2015-03-10 DIAGNOSIS — D689 Coagulation defect, unspecified: Secondary | ICD-10-CM | POA: Diagnosis not present

## 2015-03-10 DIAGNOSIS — N186 End stage renal disease: Secondary | ICD-10-CM | POA: Diagnosis not present

## 2015-03-11 DIAGNOSIS — I7090 Unspecified atherosclerosis: Secondary | ICD-10-CM | POA: Diagnosis not present

## 2015-03-11 DIAGNOSIS — Z9049 Acquired absence of other specified parts of digestive tract: Secondary | ICD-10-CM | POA: Diagnosis not present

## 2015-03-11 DIAGNOSIS — R310 Gross hematuria: Secondary | ICD-10-CM | POA: Diagnosis not present

## 2015-03-12 DIAGNOSIS — D689 Coagulation defect, unspecified: Secondary | ICD-10-CM | POA: Diagnosis not present

## 2015-03-12 DIAGNOSIS — D631 Anemia in chronic kidney disease: Secondary | ICD-10-CM | POA: Diagnosis not present

## 2015-03-12 DIAGNOSIS — E1129 Type 2 diabetes mellitus with other diabetic kidney complication: Secondary | ICD-10-CM | POA: Diagnosis not present

## 2015-03-12 DIAGNOSIS — D509 Iron deficiency anemia, unspecified: Secondary | ICD-10-CM | POA: Diagnosis not present

## 2015-03-12 DIAGNOSIS — Z23 Encounter for immunization: Secondary | ICD-10-CM | POA: Diagnosis not present

## 2015-03-12 DIAGNOSIS — N186 End stage renal disease: Secondary | ICD-10-CM | POA: Diagnosis not present

## 2015-03-12 DIAGNOSIS — E119 Type 2 diabetes mellitus without complications: Secondary | ICD-10-CM | POA: Diagnosis not present

## 2015-03-15 DIAGNOSIS — N186 End stage renal disease: Secondary | ICD-10-CM | POA: Diagnosis not present

## 2015-03-15 DIAGNOSIS — D689 Coagulation defect, unspecified: Secondary | ICD-10-CM | POA: Diagnosis not present

## 2015-03-15 DIAGNOSIS — Z23 Encounter for immunization: Secondary | ICD-10-CM | POA: Diagnosis not present

## 2015-03-15 DIAGNOSIS — D631 Anemia in chronic kidney disease: Secondary | ICD-10-CM | POA: Diagnosis not present

## 2015-03-15 DIAGNOSIS — D509 Iron deficiency anemia, unspecified: Secondary | ICD-10-CM | POA: Diagnosis not present

## 2015-03-15 DIAGNOSIS — E119 Type 2 diabetes mellitus without complications: Secondary | ICD-10-CM | POA: Diagnosis not present

## 2015-03-15 DIAGNOSIS — E1129 Type 2 diabetes mellitus with other diabetic kidney complication: Secondary | ICD-10-CM | POA: Diagnosis not present

## 2015-03-17 DIAGNOSIS — Z23 Encounter for immunization: Secondary | ICD-10-CM | POA: Diagnosis not present

## 2015-03-17 DIAGNOSIS — N186 End stage renal disease: Secondary | ICD-10-CM | POA: Diagnosis not present

## 2015-03-17 DIAGNOSIS — E119 Type 2 diabetes mellitus without complications: Secondary | ICD-10-CM | POA: Diagnosis not present

## 2015-03-17 DIAGNOSIS — D509 Iron deficiency anemia, unspecified: Secondary | ICD-10-CM | POA: Diagnosis not present

## 2015-03-17 DIAGNOSIS — D689 Coagulation defect, unspecified: Secondary | ICD-10-CM | POA: Diagnosis not present

## 2015-03-17 DIAGNOSIS — E1129 Type 2 diabetes mellitus with other diabetic kidney complication: Secondary | ICD-10-CM | POA: Diagnosis not present

## 2015-03-17 DIAGNOSIS — D631 Anemia in chronic kidney disease: Secondary | ICD-10-CM | POA: Diagnosis not present

## 2015-03-18 DIAGNOSIS — N390 Urinary tract infection, site not specified: Secondary | ICD-10-CM | POA: Diagnosis not present

## 2015-03-18 DIAGNOSIS — R319 Hematuria, unspecified: Secondary | ICD-10-CM | POA: Diagnosis not present

## 2015-03-19 DIAGNOSIS — D689 Coagulation defect, unspecified: Secondary | ICD-10-CM | POA: Diagnosis not present

## 2015-03-19 DIAGNOSIS — Z23 Encounter for immunization: Secondary | ICD-10-CM | POA: Diagnosis not present

## 2015-03-19 DIAGNOSIS — D509 Iron deficiency anemia, unspecified: Secondary | ICD-10-CM | POA: Diagnosis not present

## 2015-03-19 DIAGNOSIS — D631 Anemia in chronic kidney disease: Secondary | ICD-10-CM | POA: Diagnosis not present

## 2015-03-19 DIAGNOSIS — E1129 Type 2 diabetes mellitus with other diabetic kidney complication: Secondary | ICD-10-CM | POA: Diagnosis not present

## 2015-03-19 DIAGNOSIS — N186 End stage renal disease: Secondary | ICD-10-CM | POA: Diagnosis not present

## 2015-03-19 DIAGNOSIS — E119 Type 2 diabetes mellitus without complications: Secondary | ICD-10-CM | POA: Diagnosis not present

## 2015-03-21 DIAGNOSIS — M549 Dorsalgia, unspecified: Secondary | ICD-10-CM | POA: Diagnosis not present

## 2015-03-21 DIAGNOSIS — J309 Allergic rhinitis, unspecified: Secondary | ICD-10-CM | POA: Diagnosis not present

## 2015-03-21 DIAGNOSIS — Z992 Dependence on renal dialysis: Secondary | ICD-10-CM | POA: Diagnosis not present

## 2015-03-21 DIAGNOSIS — E1129 Type 2 diabetes mellitus with other diabetic kidney complication: Secondary | ICD-10-CM | POA: Diagnosis not present

## 2015-03-21 DIAGNOSIS — I10 Essential (primary) hypertension: Secondary | ICD-10-CM | POA: Diagnosis not present

## 2015-03-21 DIAGNOSIS — N186 End stage renal disease: Secondary | ICD-10-CM | POA: Diagnosis not present

## 2015-03-21 DIAGNOSIS — Z79899 Other long term (current) drug therapy: Secondary | ICD-10-CM | POA: Diagnosis not present

## 2015-03-22 DIAGNOSIS — D689 Coagulation defect, unspecified: Secondary | ICD-10-CM | POA: Diagnosis not present

## 2015-03-22 DIAGNOSIS — N186 End stage renal disease: Secondary | ICD-10-CM | POA: Diagnosis not present

## 2015-03-22 DIAGNOSIS — Z23 Encounter for immunization: Secondary | ICD-10-CM | POA: Diagnosis not present

## 2015-03-22 DIAGNOSIS — E119 Type 2 diabetes mellitus without complications: Secondary | ICD-10-CM | POA: Diagnosis not present

## 2015-03-22 DIAGNOSIS — E1129 Type 2 diabetes mellitus with other diabetic kidney complication: Secondary | ICD-10-CM | POA: Diagnosis not present

## 2015-03-22 DIAGNOSIS — D631 Anemia in chronic kidney disease: Secondary | ICD-10-CM | POA: Diagnosis not present

## 2015-03-23 DIAGNOSIS — N401 Enlarged prostate with lower urinary tract symptoms: Secondary | ICD-10-CM | POA: Diagnosis not present

## 2015-03-23 DIAGNOSIS — R310 Gross hematuria: Secondary | ICD-10-CM | POA: Diagnosis not present

## 2015-03-23 DIAGNOSIS — Z125 Encounter for screening for malignant neoplasm of prostate: Secondary | ICD-10-CM | POA: Diagnosis not present

## 2015-03-24 DIAGNOSIS — E1129 Type 2 diabetes mellitus with other diabetic kidney complication: Secondary | ICD-10-CM | POA: Diagnosis not present

## 2015-03-24 DIAGNOSIS — E119 Type 2 diabetes mellitus without complications: Secondary | ICD-10-CM | POA: Diagnosis not present

## 2015-03-24 DIAGNOSIS — Z23 Encounter for immunization: Secondary | ICD-10-CM | POA: Diagnosis not present

## 2015-03-24 DIAGNOSIS — D689 Coagulation defect, unspecified: Secondary | ICD-10-CM | POA: Diagnosis not present

## 2015-03-24 DIAGNOSIS — N186 End stage renal disease: Secondary | ICD-10-CM | POA: Diagnosis not present

## 2015-03-24 DIAGNOSIS — D631 Anemia in chronic kidney disease: Secondary | ICD-10-CM | POA: Diagnosis not present

## 2015-03-26 DIAGNOSIS — E119 Type 2 diabetes mellitus without complications: Secondary | ICD-10-CM | POA: Diagnosis not present

## 2015-03-26 DIAGNOSIS — Z23 Encounter for immunization: Secondary | ICD-10-CM | POA: Diagnosis not present

## 2015-03-26 DIAGNOSIS — E1129 Type 2 diabetes mellitus with other diabetic kidney complication: Secondary | ICD-10-CM | POA: Diagnosis not present

## 2015-03-26 DIAGNOSIS — D689 Coagulation defect, unspecified: Secondary | ICD-10-CM | POA: Diagnosis not present

## 2015-03-26 DIAGNOSIS — D631 Anemia in chronic kidney disease: Secondary | ICD-10-CM | POA: Diagnosis not present

## 2015-03-26 DIAGNOSIS — N186 End stage renal disease: Secondary | ICD-10-CM | POA: Diagnosis not present

## 2015-03-29 DIAGNOSIS — Z23 Encounter for immunization: Secondary | ICD-10-CM | POA: Diagnosis not present

## 2015-03-29 DIAGNOSIS — D631 Anemia in chronic kidney disease: Secondary | ICD-10-CM | POA: Diagnosis not present

## 2015-03-29 DIAGNOSIS — N186 End stage renal disease: Secondary | ICD-10-CM | POA: Diagnosis not present

## 2015-03-29 DIAGNOSIS — E1129 Type 2 diabetes mellitus with other diabetic kidney complication: Secondary | ICD-10-CM | POA: Diagnosis not present

## 2015-03-29 DIAGNOSIS — E119 Type 2 diabetes mellitus without complications: Secondary | ICD-10-CM | POA: Diagnosis not present

## 2015-03-29 DIAGNOSIS — D689 Coagulation defect, unspecified: Secondary | ICD-10-CM | POA: Diagnosis not present

## 2015-03-31 DIAGNOSIS — N186 End stage renal disease: Secondary | ICD-10-CM | POA: Diagnosis not present

## 2015-03-31 DIAGNOSIS — E1129 Type 2 diabetes mellitus with other diabetic kidney complication: Secondary | ICD-10-CM | POA: Diagnosis not present

## 2015-03-31 DIAGNOSIS — D631 Anemia in chronic kidney disease: Secondary | ICD-10-CM | POA: Diagnosis not present

## 2015-03-31 DIAGNOSIS — E119 Type 2 diabetes mellitus without complications: Secondary | ICD-10-CM | POA: Diagnosis not present

## 2015-03-31 DIAGNOSIS — N390 Urinary tract infection, site not specified: Secondary | ICD-10-CM | POA: Diagnosis not present

## 2015-03-31 DIAGNOSIS — D689 Coagulation defect, unspecified: Secondary | ICD-10-CM | POA: Diagnosis not present

## 2015-03-31 DIAGNOSIS — R319 Hematuria, unspecified: Secondary | ICD-10-CM | POA: Diagnosis not present

## 2015-03-31 DIAGNOSIS — Z23 Encounter for immunization: Secondary | ICD-10-CM | POA: Diagnosis not present

## 2015-04-02 DIAGNOSIS — E119 Type 2 diabetes mellitus without complications: Secondary | ICD-10-CM | POA: Diagnosis not present

## 2015-04-02 DIAGNOSIS — D689 Coagulation defect, unspecified: Secondary | ICD-10-CM | POA: Diagnosis not present

## 2015-04-02 DIAGNOSIS — E1129 Type 2 diabetes mellitus with other diabetic kidney complication: Secondary | ICD-10-CM | POA: Diagnosis not present

## 2015-04-02 DIAGNOSIS — D631 Anemia in chronic kidney disease: Secondary | ICD-10-CM | POA: Diagnosis not present

## 2015-04-02 DIAGNOSIS — Z23 Encounter for immunization: Secondary | ICD-10-CM | POA: Diagnosis not present

## 2015-04-02 DIAGNOSIS — N186 End stage renal disease: Secondary | ICD-10-CM | POA: Diagnosis not present

## 2015-04-05 DIAGNOSIS — E119 Type 2 diabetes mellitus without complications: Secondary | ICD-10-CM | POA: Diagnosis not present

## 2015-04-05 DIAGNOSIS — E1129 Type 2 diabetes mellitus with other diabetic kidney complication: Secondary | ICD-10-CM | POA: Diagnosis not present

## 2015-04-05 DIAGNOSIS — N186 End stage renal disease: Secondary | ICD-10-CM | POA: Diagnosis not present

## 2015-04-05 DIAGNOSIS — Z23 Encounter for immunization: Secondary | ICD-10-CM | POA: Diagnosis not present

## 2015-04-05 DIAGNOSIS — D631 Anemia in chronic kidney disease: Secondary | ICD-10-CM | POA: Diagnosis not present

## 2015-04-05 DIAGNOSIS — D689 Coagulation defect, unspecified: Secondary | ICD-10-CM | POA: Diagnosis not present

## 2015-04-07 DIAGNOSIS — E119 Type 2 diabetes mellitus without complications: Secondary | ICD-10-CM | POA: Diagnosis not present

## 2015-04-07 DIAGNOSIS — E1129 Type 2 diabetes mellitus with other diabetic kidney complication: Secondary | ICD-10-CM | POA: Diagnosis not present

## 2015-04-07 DIAGNOSIS — D689 Coagulation defect, unspecified: Secondary | ICD-10-CM | POA: Diagnosis not present

## 2015-04-07 DIAGNOSIS — D631 Anemia in chronic kidney disease: Secondary | ICD-10-CM | POA: Diagnosis not present

## 2015-04-07 DIAGNOSIS — Z23 Encounter for immunization: Secondary | ICD-10-CM | POA: Diagnosis not present

## 2015-04-07 DIAGNOSIS — N186 End stage renal disease: Secondary | ICD-10-CM | POA: Diagnosis not present

## 2015-04-09 DIAGNOSIS — N186 End stage renal disease: Secondary | ICD-10-CM | POA: Diagnosis not present

## 2015-04-09 DIAGNOSIS — Z23 Encounter for immunization: Secondary | ICD-10-CM | POA: Diagnosis not present

## 2015-04-09 DIAGNOSIS — E1129 Type 2 diabetes mellitus with other diabetic kidney complication: Secondary | ICD-10-CM | POA: Diagnosis not present

## 2015-04-09 DIAGNOSIS — E119 Type 2 diabetes mellitus without complications: Secondary | ICD-10-CM | POA: Diagnosis not present

## 2015-04-09 DIAGNOSIS — D631 Anemia in chronic kidney disease: Secondary | ICD-10-CM | POA: Diagnosis not present

## 2015-04-09 DIAGNOSIS — D689 Coagulation defect, unspecified: Secondary | ICD-10-CM | POA: Diagnosis not present

## 2015-04-12 DIAGNOSIS — Z23 Encounter for immunization: Secondary | ICD-10-CM | POA: Diagnosis not present

## 2015-04-12 DIAGNOSIS — D631 Anemia in chronic kidney disease: Secondary | ICD-10-CM | POA: Diagnosis not present

## 2015-04-12 DIAGNOSIS — E1129 Type 2 diabetes mellitus with other diabetic kidney complication: Secondary | ICD-10-CM | POA: Diagnosis not present

## 2015-04-12 DIAGNOSIS — E119 Type 2 diabetes mellitus without complications: Secondary | ICD-10-CM | POA: Diagnosis not present

## 2015-04-12 DIAGNOSIS — N186 End stage renal disease: Secondary | ICD-10-CM | POA: Diagnosis not present

## 2015-04-12 DIAGNOSIS — D689 Coagulation defect, unspecified: Secondary | ICD-10-CM | POA: Diagnosis not present

## 2015-04-14 DIAGNOSIS — E1129 Type 2 diabetes mellitus with other diabetic kidney complication: Secondary | ICD-10-CM | POA: Diagnosis not present

## 2015-04-14 DIAGNOSIS — E119 Type 2 diabetes mellitus without complications: Secondary | ICD-10-CM | POA: Diagnosis not present

## 2015-04-14 DIAGNOSIS — D631 Anemia in chronic kidney disease: Secondary | ICD-10-CM | POA: Diagnosis not present

## 2015-04-14 DIAGNOSIS — D689 Coagulation defect, unspecified: Secondary | ICD-10-CM | POA: Diagnosis not present

## 2015-04-14 DIAGNOSIS — N186 End stage renal disease: Secondary | ICD-10-CM | POA: Diagnosis not present

## 2015-04-14 DIAGNOSIS — Z23 Encounter for immunization: Secondary | ICD-10-CM | POA: Diagnosis not present

## 2015-04-16 DIAGNOSIS — Z23 Encounter for immunization: Secondary | ICD-10-CM | POA: Diagnosis not present

## 2015-04-16 DIAGNOSIS — E1129 Type 2 diabetes mellitus with other diabetic kidney complication: Secondary | ICD-10-CM | POA: Diagnosis not present

## 2015-04-16 DIAGNOSIS — D689 Coagulation defect, unspecified: Secondary | ICD-10-CM | POA: Diagnosis not present

## 2015-04-16 DIAGNOSIS — D631 Anemia in chronic kidney disease: Secondary | ICD-10-CM | POA: Diagnosis not present

## 2015-04-16 DIAGNOSIS — N186 End stage renal disease: Secondary | ICD-10-CM | POA: Diagnosis not present

## 2015-04-16 DIAGNOSIS — E119 Type 2 diabetes mellitus without complications: Secondary | ICD-10-CM | POA: Diagnosis not present

## 2015-04-19 DIAGNOSIS — E119 Type 2 diabetes mellitus without complications: Secondary | ICD-10-CM | POA: Diagnosis not present

## 2015-04-19 DIAGNOSIS — D689 Coagulation defect, unspecified: Secondary | ICD-10-CM | POA: Diagnosis not present

## 2015-04-19 DIAGNOSIS — Z23 Encounter for immunization: Secondary | ICD-10-CM | POA: Diagnosis not present

## 2015-04-19 DIAGNOSIS — D631 Anemia in chronic kidney disease: Secondary | ICD-10-CM | POA: Diagnosis not present

## 2015-04-19 DIAGNOSIS — N186 End stage renal disease: Secondary | ICD-10-CM | POA: Diagnosis not present

## 2015-04-19 DIAGNOSIS — E1129 Type 2 diabetes mellitus with other diabetic kidney complication: Secondary | ICD-10-CM | POA: Diagnosis not present

## 2015-04-20 DIAGNOSIS — J309 Allergic rhinitis, unspecified: Secondary | ICD-10-CM | POA: Diagnosis not present

## 2015-04-20 DIAGNOSIS — E1129 Type 2 diabetes mellitus with other diabetic kidney complication: Secondary | ICD-10-CM | POA: Diagnosis not present

## 2015-04-20 DIAGNOSIS — R05 Cough: Secondary | ICD-10-CM | POA: Diagnosis not present

## 2015-04-20 DIAGNOSIS — M549 Dorsalgia, unspecified: Secondary | ICD-10-CM | POA: Diagnosis not present

## 2015-04-20 DIAGNOSIS — N186 End stage renal disease: Secondary | ICD-10-CM | POA: Diagnosis not present

## 2015-04-20 DIAGNOSIS — Z992 Dependence on renal dialysis: Secondary | ICD-10-CM | POA: Diagnosis not present

## 2015-04-20 DIAGNOSIS — I517 Cardiomegaly: Secondary | ICD-10-CM | POA: Diagnosis not present

## 2015-04-20 DIAGNOSIS — R042 Hemoptysis: Secondary | ICD-10-CM | POA: Diagnosis not present

## 2015-04-20 DIAGNOSIS — I10 Essential (primary) hypertension: Secondary | ICD-10-CM | POA: Diagnosis not present

## 2015-04-21 DIAGNOSIS — E1129 Type 2 diabetes mellitus with other diabetic kidney complication: Secondary | ICD-10-CM | POA: Diagnosis not present

## 2015-04-21 DIAGNOSIS — D509 Iron deficiency anemia, unspecified: Secondary | ICD-10-CM | POA: Diagnosis not present

## 2015-04-21 DIAGNOSIS — D631 Anemia in chronic kidney disease: Secondary | ICD-10-CM | POA: Diagnosis not present

## 2015-04-21 DIAGNOSIS — E119 Type 2 diabetes mellitus without complications: Secondary | ICD-10-CM | POA: Diagnosis not present

## 2015-04-21 DIAGNOSIS — D689 Coagulation defect, unspecified: Secondary | ICD-10-CM | POA: Diagnosis not present

## 2015-04-21 DIAGNOSIS — N186 End stage renal disease: Secondary | ICD-10-CM | POA: Diagnosis not present

## 2015-04-23 DIAGNOSIS — D631 Anemia in chronic kidney disease: Secondary | ICD-10-CM | POA: Diagnosis not present

## 2015-04-23 DIAGNOSIS — E1129 Type 2 diabetes mellitus with other diabetic kidney complication: Secondary | ICD-10-CM | POA: Diagnosis not present

## 2015-04-23 DIAGNOSIS — D509 Iron deficiency anemia, unspecified: Secondary | ICD-10-CM | POA: Diagnosis not present

## 2015-04-23 DIAGNOSIS — E119 Type 2 diabetes mellitus without complications: Secondary | ICD-10-CM | POA: Diagnosis not present

## 2015-04-23 DIAGNOSIS — D689 Coagulation defect, unspecified: Secondary | ICD-10-CM | POA: Diagnosis not present

## 2015-04-23 DIAGNOSIS — N186 End stage renal disease: Secondary | ICD-10-CM | POA: Diagnosis not present

## 2015-04-26 DIAGNOSIS — E1129 Type 2 diabetes mellitus with other diabetic kidney complication: Secondary | ICD-10-CM | POA: Diagnosis not present

## 2015-04-26 DIAGNOSIS — D689 Coagulation defect, unspecified: Secondary | ICD-10-CM | POA: Diagnosis not present

## 2015-04-26 DIAGNOSIS — N186 End stage renal disease: Secondary | ICD-10-CM | POA: Diagnosis not present

## 2015-04-26 DIAGNOSIS — E119 Type 2 diabetes mellitus without complications: Secondary | ICD-10-CM | POA: Diagnosis not present

## 2015-04-26 DIAGNOSIS — D509 Iron deficiency anemia, unspecified: Secondary | ICD-10-CM | POA: Diagnosis not present

## 2015-04-26 DIAGNOSIS — D631 Anemia in chronic kidney disease: Secondary | ICD-10-CM | POA: Diagnosis not present

## 2015-04-28 DIAGNOSIS — E119 Type 2 diabetes mellitus without complications: Secondary | ICD-10-CM | POA: Diagnosis not present

## 2015-04-28 DIAGNOSIS — D509 Iron deficiency anemia, unspecified: Secondary | ICD-10-CM | POA: Diagnosis not present

## 2015-04-28 DIAGNOSIS — E1129 Type 2 diabetes mellitus with other diabetic kidney complication: Secondary | ICD-10-CM | POA: Diagnosis not present

## 2015-04-28 DIAGNOSIS — N186 End stage renal disease: Secondary | ICD-10-CM | POA: Diagnosis not present

## 2015-04-28 DIAGNOSIS — D631 Anemia in chronic kidney disease: Secondary | ICD-10-CM | POA: Diagnosis not present

## 2015-04-28 DIAGNOSIS — D689 Coagulation defect, unspecified: Secondary | ICD-10-CM | POA: Diagnosis not present

## 2015-04-30 DIAGNOSIS — E119 Type 2 diabetes mellitus without complications: Secondary | ICD-10-CM | POA: Diagnosis not present

## 2015-04-30 DIAGNOSIS — D689 Coagulation defect, unspecified: Secondary | ICD-10-CM | POA: Diagnosis not present

## 2015-04-30 DIAGNOSIS — E1129 Type 2 diabetes mellitus with other diabetic kidney complication: Secondary | ICD-10-CM | POA: Diagnosis not present

## 2015-04-30 DIAGNOSIS — D509 Iron deficiency anemia, unspecified: Secondary | ICD-10-CM | POA: Diagnosis not present

## 2015-04-30 DIAGNOSIS — N186 End stage renal disease: Secondary | ICD-10-CM | POA: Diagnosis not present

## 2015-04-30 DIAGNOSIS — D631 Anemia in chronic kidney disease: Secondary | ICD-10-CM | POA: Diagnosis not present

## 2015-05-03 DIAGNOSIS — D631 Anemia in chronic kidney disease: Secondary | ICD-10-CM | POA: Diagnosis not present

## 2015-05-03 DIAGNOSIS — N186 End stage renal disease: Secondary | ICD-10-CM | POA: Diagnosis not present

## 2015-05-03 DIAGNOSIS — E1129 Type 2 diabetes mellitus with other diabetic kidney complication: Secondary | ICD-10-CM | POA: Diagnosis not present

## 2015-05-03 DIAGNOSIS — E119 Type 2 diabetes mellitus without complications: Secondary | ICD-10-CM | POA: Diagnosis not present

## 2015-05-03 DIAGNOSIS — D689 Coagulation defect, unspecified: Secondary | ICD-10-CM | POA: Diagnosis not present

## 2015-05-03 DIAGNOSIS — D509 Iron deficiency anemia, unspecified: Secondary | ICD-10-CM | POA: Diagnosis not present

## 2015-05-05 DIAGNOSIS — D509 Iron deficiency anemia, unspecified: Secondary | ICD-10-CM | POA: Diagnosis not present

## 2015-05-05 DIAGNOSIS — D631 Anemia in chronic kidney disease: Secondary | ICD-10-CM | POA: Diagnosis not present

## 2015-05-05 DIAGNOSIS — N186 End stage renal disease: Secondary | ICD-10-CM | POA: Diagnosis not present

## 2015-05-05 DIAGNOSIS — E119 Type 2 diabetes mellitus without complications: Secondary | ICD-10-CM | POA: Diagnosis not present

## 2015-05-05 DIAGNOSIS — D689 Coagulation defect, unspecified: Secondary | ICD-10-CM | POA: Diagnosis not present

## 2015-05-05 DIAGNOSIS — E1129 Type 2 diabetes mellitus with other diabetic kidney complication: Secondary | ICD-10-CM | POA: Diagnosis not present

## 2015-05-07 DIAGNOSIS — E119 Type 2 diabetes mellitus without complications: Secondary | ICD-10-CM | POA: Diagnosis not present

## 2015-05-07 DIAGNOSIS — E1129 Type 2 diabetes mellitus with other diabetic kidney complication: Secondary | ICD-10-CM | POA: Diagnosis not present

## 2015-05-07 DIAGNOSIS — N186 End stage renal disease: Secondary | ICD-10-CM | POA: Diagnosis not present

## 2015-05-07 DIAGNOSIS — D689 Coagulation defect, unspecified: Secondary | ICD-10-CM | POA: Diagnosis not present

## 2015-05-07 DIAGNOSIS — D509 Iron deficiency anemia, unspecified: Secondary | ICD-10-CM | POA: Diagnosis not present

## 2015-05-07 DIAGNOSIS — D631 Anemia in chronic kidney disease: Secondary | ICD-10-CM | POA: Diagnosis not present

## 2015-05-10 DIAGNOSIS — D689 Coagulation defect, unspecified: Secondary | ICD-10-CM | POA: Diagnosis not present

## 2015-05-10 DIAGNOSIS — E119 Type 2 diabetes mellitus without complications: Secondary | ICD-10-CM | POA: Diagnosis not present

## 2015-05-10 DIAGNOSIS — N186 End stage renal disease: Secondary | ICD-10-CM | POA: Diagnosis not present

## 2015-05-10 DIAGNOSIS — E1129 Type 2 diabetes mellitus with other diabetic kidney complication: Secondary | ICD-10-CM | POA: Diagnosis not present

## 2015-05-10 DIAGNOSIS — D509 Iron deficiency anemia, unspecified: Secondary | ICD-10-CM | POA: Diagnosis not present

## 2015-05-10 DIAGNOSIS — D631 Anemia in chronic kidney disease: Secondary | ICD-10-CM | POA: Diagnosis not present

## 2015-05-12 DIAGNOSIS — D689 Coagulation defect, unspecified: Secondary | ICD-10-CM | POA: Diagnosis not present

## 2015-05-12 DIAGNOSIS — D509 Iron deficiency anemia, unspecified: Secondary | ICD-10-CM | POA: Diagnosis not present

## 2015-05-12 DIAGNOSIS — E119 Type 2 diabetes mellitus without complications: Secondary | ICD-10-CM | POA: Diagnosis not present

## 2015-05-12 DIAGNOSIS — D631 Anemia in chronic kidney disease: Secondary | ICD-10-CM | POA: Diagnosis not present

## 2015-05-12 DIAGNOSIS — E1129 Type 2 diabetes mellitus with other diabetic kidney complication: Secondary | ICD-10-CM | POA: Diagnosis not present

## 2015-05-12 DIAGNOSIS — N186 End stage renal disease: Secondary | ICD-10-CM | POA: Diagnosis not present

## 2015-05-14 DIAGNOSIS — N186 End stage renal disease: Secondary | ICD-10-CM | POA: Diagnosis not present

## 2015-05-14 DIAGNOSIS — D509 Iron deficiency anemia, unspecified: Secondary | ICD-10-CM | POA: Diagnosis not present

## 2015-05-14 DIAGNOSIS — E1129 Type 2 diabetes mellitus with other diabetic kidney complication: Secondary | ICD-10-CM | POA: Diagnosis not present

## 2015-05-14 DIAGNOSIS — D631 Anemia in chronic kidney disease: Secondary | ICD-10-CM | POA: Diagnosis not present

## 2015-05-14 DIAGNOSIS — D689 Coagulation defect, unspecified: Secondary | ICD-10-CM | POA: Diagnosis not present

## 2015-05-14 DIAGNOSIS — E119 Type 2 diabetes mellitus without complications: Secondary | ICD-10-CM | POA: Diagnosis not present

## 2015-05-17 DIAGNOSIS — D689 Coagulation defect, unspecified: Secondary | ICD-10-CM | POA: Diagnosis not present

## 2015-05-17 DIAGNOSIS — E119 Type 2 diabetes mellitus without complications: Secondary | ICD-10-CM | POA: Diagnosis not present

## 2015-05-17 DIAGNOSIS — N186 End stage renal disease: Secondary | ICD-10-CM | POA: Diagnosis not present

## 2015-05-17 DIAGNOSIS — D509 Iron deficiency anemia, unspecified: Secondary | ICD-10-CM | POA: Diagnosis not present

## 2015-05-17 DIAGNOSIS — D631 Anemia in chronic kidney disease: Secondary | ICD-10-CM | POA: Diagnosis not present

## 2015-05-17 DIAGNOSIS — E1129 Type 2 diabetes mellitus with other diabetic kidney complication: Secondary | ICD-10-CM | POA: Diagnosis not present

## 2015-05-19 DIAGNOSIS — D509 Iron deficiency anemia, unspecified: Secondary | ICD-10-CM | POA: Diagnosis not present

## 2015-05-19 DIAGNOSIS — D631 Anemia in chronic kidney disease: Secondary | ICD-10-CM | POA: Diagnosis not present

## 2015-05-19 DIAGNOSIS — E119 Type 2 diabetes mellitus without complications: Secondary | ICD-10-CM | POA: Diagnosis not present

## 2015-05-19 DIAGNOSIS — N186 End stage renal disease: Secondary | ICD-10-CM | POA: Diagnosis not present

## 2015-05-19 DIAGNOSIS — D689 Coagulation defect, unspecified: Secondary | ICD-10-CM | POA: Diagnosis not present

## 2015-05-19 DIAGNOSIS — E1129 Type 2 diabetes mellitus with other diabetic kidney complication: Secondary | ICD-10-CM | POA: Diagnosis not present

## 2015-05-21 DIAGNOSIS — D509 Iron deficiency anemia, unspecified: Secondary | ICD-10-CM | POA: Diagnosis not present

## 2015-05-21 DIAGNOSIS — N186 End stage renal disease: Secondary | ICD-10-CM | POA: Diagnosis not present

## 2015-05-21 DIAGNOSIS — D689 Coagulation defect, unspecified: Secondary | ICD-10-CM | POA: Diagnosis not present

## 2015-05-21 DIAGNOSIS — E1129 Type 2 diabetes mellitus with other diabetic kidney complication: Secondary | ICD-10-CM | POA: Diagnosis not present

## 2015-05-21 DIAGNOSIS — Z992 Dependence on renal dialysis: Secondary | ICD-10-CM | POA: Diagnosis not present

## 2015-05-21 DIAGNOSIS — D631 Anemia in chronic kidney disease: Secondary | ICD-10-CM | POA: Diagnosis not present

## 2015-05-21 DIAGNOSIS — E119 Type 2 diabetes mellitus without complications: Secondary | ICD-10-CM | POA: Diagnosis not present

## 2015-05-24 DIAGNOSIS — R52 Pain, unspecified: Secondary | ICD-10-CM | POA: Diagnosis not present

## 2015-05-24 DIAGNOSIS — D631 Anemia in chronic kidney disease: Secondary | ICD-10-CM | POA: Diagnosis not present

## 2015-05-24 DIAGNOSIS — E119 Type 2 diabetes mellitus without complications: Secondary | ICD-10-CM | POA: Diagnosis not present

## 2015-05-24 DIAGNOSIS — N186 End stage renal disease: Secondary | ICD-10-CM | POA: Diagnosis not present

## 2015-05-24 DIAGNOSIS — E1129 Type 2 diabetes mellitus with other diabetic kidney complication: Secondary | ICD-10-CM | POA: Diagnosis not present

## 2015-05-24 DIAGNOSIS — D689 Coagulation defect, unspecified: Secondary | ICD-10-CM | POA: Diagnosis not present

## 2015-05-26 DIAGNOSIS — D631 Anemia in chronic kidney disease: Secondary | ICD-10-CM | POA: Diagnosis not present

## 2015-05-26 DIAGNOSIS — N186 End stage renal disease: Secondary | ICD-10-CM | POA: Diagnosis not present

## 2015-05-26 DIAGNOSIS — R52 Pain, unspecified: Secondary | ICD-10-CM | POA: Diagnosis not present

## 2015-05-26 DIAGNOSIS — D689 Coagulation defect, unspecified: Secondary | ICD-10-CM | POA: Diagnosis not present

## 2015-05-26 DIAGNOSIS — E1129 Type 2 diabetes mellitus with other diabetic kidney complication: Secondary | ICD-10-CM | POA: Diagnosis not present

## 2015-05-26 DIAGNOSIS — E119 Type 2 diabetes mellitus without complications: Secondary | ICD-10-CM | POA: Diagnosis not present

## 2015-05-31 DIAGNOSIS — D631 Anemia in chronic kidney disease: Secondary | ICD-10-CM | POA: Diagnosis not present

## 2015-05-31 DIAGNOSIS — E1129 Type 2 diabetes mellitus with other diabetic kidney complication: Secondary | ICD-10-CM | POA: Diagnosis not present

## 2015-05-31 DIAGNOSIS — D689 Coagulation defect, unspecified: Secondary | ICD-10-CM | POA: Diagnosis not present

## 2015-05-31 DIAGNOSIS — R52 Pain, unspecified: Secondary | ICD-10-CM | POA: Diagnosis not present

## 2015-05-31 DIAGNOSIS — N186 End stage renal disease: Secondary | ICD-10-CM | POA: Diagnosis not present

## 2015-05-31 DIAGNOSIS — E119 Type 2 diabetes mellitus without complications: Secondary | ICD-10-CM | POA: Diagnosis not present

## 2015-06-01 DIAGNOSIS — G8929 Other chronic pain: Secondary | ICD-10-CM | POA: Diagnosis not present

## 2015-06-01 DIAGNOSIS — N390 Urinary tract infection, site not specified: Secondary | ICD-10-CM | POA: Diagnosis not present

## 2015-06-01 DIAGNOSIS — R319 Hematuria, unspecified: Secondary | ICD-10-CM | POA: Diagnosis not present

## 2015-06-01 DIAGNOSIS — M5441 Lumbago with sciatica, right side: Secondary | ICD-10-CM | POA: Diagnosis not present

## 2015-06-01 DIAGNOSIS — Z79899 Other long term (current) drug therapy: Secondary | ICD-10-CM | POA: Diagnosis not present

## 2015-06-01 DIAGNOSIS — J300 Vasomotor rhinitis: Secondary | ICD-10-CM | POA: Diagnosis not present

## 2015-06-02 DIAGNOSIS — R52 Pain, unspecified: Secondary | ICD-10-CM | POA: Diagnosis not present

## 2015-06-02 DIAGNOSIS — E1129 Type 2 diabetes mellitus with other diabetic kidney complication: Secondary | ICD-10-CM | POA: Diagnosis not present

## 2015-06-02 DIAGNOSIS — N186 End stage renal disease: Secondary | ICD-10-CM | POA: Diagnosis not present

## 2015-06-02 DIAGNOSIS — D631 Anemia in chronic kidney disease: Secondary | ICD-10-CM | POA: Diagnosis not present

## 2015-06-02 DIAGNOSIS — D689 Coagulation defect, unspecified: Secondary | ICD-10-CM | POA: Diagnosis not present

## 2015-06-02 DIAGNOSIS — E119 Type 2 diabetes mellitus without complications: Secondary | ICD-10-CM | POA: Diagnosis not present

## 2015-06-04 DIAGNOSIS — N186 End stage renal disease: Secondary | ICD-10-CM | POA: Diagnosis not present

## 2015-06-04 DIAGNOSIS — D631 Anemia in chronic kidney disease: Secondary | ICD-10-CM | POA: Diagnosis not present

## 2015-06-04 DIAGNOSIS — D689 Coagulation defect, unspecified: Secondary | ICD-10-CM | POA: Diagnosis not present

## 2015-06-04 DIAGNOSIS — E1129 Type 2 diabetes mellitus with other diabetic kidney complication: Secondary | ICD-10-CM | POA: Diagnosis not present

## 2015-06-04 DIAGNOSIS — E119 Type 2 diabetes mellitus without complications: Secondary | ICD-10-CM | POA: Diagnosis not present

## 2015-06-04 DIAGNOSIS — R52 Pain, unspecified: Secondary | ICD-10-CM | POA: Diagnosis not present

## 2015-06-07 DIAGNOSIS — R52 Pain, unspecified: Secondary | ICD-10-CM | POA: Diagnosis not present

## 2015-06-07 DIAGNOSIS — D631 Anemia in chronic kidney disease: Secondary | ICD-10-CM | POA: Diagnosis not present

## 2015-06-07 DIAGNOSIS — D689 Coagulation defect, unspecified: Secondary | ICD-10-CM | POA: Diagnosis not present

## 2015-06-07 DIAGNOSIS — N186 End stage renal disease: Secondary | ICD-10-CM | POA: Diagnosis not present

## 2015-06-07 DIAGNOSIS — E1129 Type 2 diabetes mellitus with other diabetic kidney complication: Secondary | ICD-10-CM | POA: Diagnosis not present

## 2015-06-07 DIAGNOSIS — E119 Type 2 diabetes mellitus without complications: Secondary | ICD-10-CM | POA: Diagnosis not present

## 2015-06-09 DIAGNOSIS — D631 Anemia in chronic kidney disease: Secondary | ICD-10-CM | POA: Diagnosis not present

## 2015-06-09 DIAGNOSIS — R52 Pain, unspecified: Secondary | ICD-10-CM | POA: Diagnosis not present

## 2015-06-09 DIAGNOSIS — E119 Type 2 diabetes mellitus without complications: Secondary | ICD-10-CM | POA: Diagnosis not present

## 2015-06-09 DIAGNOSIS — D689 Coagulation defect, unspecified: Secondary | ICD-10-CM | POA: Diagnosis not present

## 2015-06-09 DIAGNOSIS — E1129 Type 2 diabetes mellitus with other diabetic kidney complication: Secondary | ICD-10-CM | POA: Diagnosis not present

## 2015-06-09 DIAGNOSIS — N186 End stage renal disease: Secondary | ICD-10-CM | POA: Diagnosis not present

## 2015-06-11 DIAGNOSIS — R52 Pain, unspecified: Secondary | ICD-10-CM | POA: Diagnosis not present

## 2015-06-11 DIAGNOSIS — D689 Coagulation defect, unspecified: Secondary | ICD-10-CM | POA: Diagnosis not present

## 2015-06-11 DIAGNOSIS — E1129 Type 2 diabetes mellitus with other diabetic kidney complication: Secondary | ICD-10-CM | POA: Diagnosis not present

## 2015-06-11 DIAGNOSIS — N186 End stage renal disease: Secondary | ICD-10-CM | POA: Diagnosis not present

## 2015-06-11 DIAGNOSIS — D631 Anemia in chronic kidney disease: Secondary | ICD-10-CM | POA: Diagnosis not present

## 2015-06-11 DIAGNOSIS — E119 Type 2 diabetes mellitus without complications: Secondary | ICD-10-CM | POA: Diagnosis not present

## 2015-06-14 DIAGNOSIS — R52 Pain, unspecified: Secondary | ICD-10-CM | POA: Diagnosis not present

## 2015-06-14 DIAGNOSIS — E1129 Type 2 diabetes mellitus with other diabetic kidney complication: Secondary | ICD-10-CM | POA: Diagnosis not present

## 2015-06-14 DIAGNOSIS — E119 Type 2 diabetes mellitus without complications: Secondary | ICD-10-CM | POA: Diagnosis not present

## 2015-06-14 DIAGNOSIS — D689 Coagulation defect, unspecified: Secondary | ICD-10-CM | POA: Diagnosis not present

## 2015-06-14 DIAGNOSIS — N186 End stage renal disease: Secondary | ICD-10-CM | POA: Diagnosis not present

## 2015-06-14 DIAGNOSIS — D631 Anemia in chronic kidney disease: Secondary | ICD-10-CM | POA: Diagnosis not present

## 2015-06-16 DIAGNOSIS — D631 Anemia in chronic kidney disease: Secondary | ICD-10-CM | POA: Diagnosis not present

## 2015-06-16 DIAGNOSIS — N186 End stage renal disease: Secondary | ICD-10-CM | POA: Diagnosis not present

## 2015-06-16 DIAGNOSIS — E119 Type 2 diabetes mellitus without complications: Secondary | ICD-10-CM | POA: Diagnosis not present

## 2015-06-16 DIAGNOSIS — D689 Coagulation defect, unspecified: Secondary | ICD-10-CM | POA: Diagnosis not present

## 2015-06-16 DIAGNOSIS — E1129 Type 2 diabetes mellitus with other diabetic kidney complication: Secondary | ICD-10-CM | POA: Diagnosis not present

## 2015-06-16 DIAGNOSIS — R52 Pain, unspecified: Secondary | ICD-10-CM | POA: Diagnosis not present

## 2015-06-18 DIAGNOSIS — D631 Anemia in chronic kidney disease: Secondary | ICD-10-CM | POA: Diagnosis not present

## 2015-06-18 DIAGNOSIS — E1129 Type 2 diabetes mellitus with other diabetic kidney complication: Secondary | ICD-10-CM | POA: Diagnosis not present

## 2015-06-18 DIAGNOSIS — R52 Pain, unspecified: Secondary | ICD-10-CM | POA: Diagnosis not present

## 2015-06-18 DIAGNOSIS — N186 End stage renal disease: Secondary | ICD-10-CM | POA: Diagnosis not present

## 2015-06-18 DIAGNOSIS — D689 Coagulation defect, unspecified: Secondary | ICD-10-CM | POA: Diagnosis not present

## 2015-06-18 DIAGNOSIS — E119 Type 2 diabetes mellitus without complications: Secondary | ICD-10-CM | POA: Diagnosis not present

## 2015-06-21 DIAGNOSIS — E119 Type 2 diabetes mellitus without complications: Secondary | ICD-10-CM | POA: Diagnosis not present

## 2015-06-21 DIAGNOSIS — D631 Anemia in chronic kidney disease: Secondary | ICD-10-CM | POA: Diagnosis not present

## 2015-06-21 DIAGNOSIS — D689 Coagulation defect, unspecified: Secondary | ICD-10-CM | POA: Diagnosis not present

## 2015-06-21 DIAGNOSIS — Z992 Dependence on renal dialysis: Secondary | ICD-10-CM | POA: Diagnosis not present

## 2015-06-21 DIAGNOSIS — R52 Pain, unspecified: Secondary | ICD-10-CM | POA: Diagnosis not present

## 2015-06-21 DIAGNOSIS — N186 End stage renal disease: Secondary | ICD-10-CM | POA: Diagnosis not present

## 2015-06-21 DIAGNOSIS — E1129 Type 2 diabetes mellitus with other diabetic kidney complication: Secondary | ICD-10-CM | POA: Diagnosis not present

## 2015-06-23 DIAGNOSIS — D689 Coagulation defect, unspecified: Secondary | ICD-10-CM | POA: Diagnosis not present

## 2015-06-23 DIAGNOSIS — E1129 Type 2 diabetes mellitus with other diabetic kidney complication: Secondary | ICD-10-CM | POA: Diagnosis not present

## 2015-06-23 DIAGNOSIS — E119 Type 2 diabetes mellitus without complications: Secondary | ICD-10-CM | POA: Diagnosis not present

## 2015-06-23 DIAGNOSIS — N186 End stage renal disease: Secondary | ICD-10-CM | POA: Diagnosis not present

## 2015-06-23 DIAGNOSIS — D509 Iron deficiency anemia, unspecified: Secondary | ICD-10-CM | POA: Diagnosis not present

## 2015-06-25 DIAGNOSIS — D689 Coagulation defect, unspecified: Secondary | ICD-10-CM | POA: Diagnosis not present

## 2015-06-25 DIAGNOSIS — E119 Type 2 diabetes mellitus without complications: Secondary | ICD-10-CM | POA: Diagnosis not present

## 2015-06-25 DIAGNOSIS — N186 End stage renal disease: Secondary | ICD-10-CM | POA: Diagnosis not present

## 2015-06-25 DIAGNOSIS — E1129 Type 2 diabetes mellitus with other diabetic kidney complication: Secondary | ICD-10-CM | POA: Diagnosis not present

## 2015-06-25 DIAGNOSIS — D509 Iron deficiency anemia, unspecified: Secondary | ICD-10-CM | POA: Diagnosis not present

## 2015-06-28 DIAGNOSIS — N186 End stage renal disease: Secondary | ICD-10-CM | POA: Diagnosis not present

## 2015-06-28 DIAGNOSIS — D509 Iron deficiency anemia, unspecified: Secondary | ICD-10-CM | POA: Diagnosis not present

## 2015-06-28 DIAGNOSIS — D689 Coagulation defect, unspecified: Secondary | ICD-10-CM | POA: Diagnosis not present

## 2015-06-28 DIAGNOSIS — E119 Type 2 diabetes mellitus without complications: Secondary | ICD-10-CM | POA: Diagnosis not present

## 2015-06-28 DIAGNOSIS — E1129 Type 2 diabetes mellitus with other diabetic kidney complication: Secondary | ICD-10-CM | POA: Diagnosis not present

## 2015-06-30 DIAGNOSIS — E119 Type 2 diabetes mellitus without complications: Secondary | ICD-10-CM | POA: Diagnosis not present

## 2015-06-30 DIAGNOSIS — D509 Iron deficiency anemia, unspecified: Secondary | ICD-10-CM | POA: Diagnosis not present

## 2015-06-30 DIAGNOSIS — D689 Coagulation defect, unspecified: Secondary | ICD-10-CM | POA: Diagnosis not present

## 2015-06-30 DIAGNOSIS — N186 End stage renal disease: Secondary | ICD-10-CM | POA: Diagnosis not present

## 2015-06-30 DIAGNOSIS — E1129 Type 2 diabetes mellitus with other diabetic kidney complication: Secondary | ICD-10-CM | POA: Diagnosis not present

## 2015-07-01 DIAGNOSIS — J310 Chronic rhinitis: Secondary | ICD-10-CM | POA: Diagnosis not present

## 2015-07-01 DIAGNOSIS — M5441 Lumbago with sciatica, right side: Secondary | ICD-10-CM | POA: Diagnosis not present

## 2015-07-01 DIAGNOSIS — G8929 Other chronic pain: Secondary | ICD-10-CM | POA: Diagnosis not present

## 2015-07-01 DIAGNOSIS — Z79899 Other long term (current) drug therapy: Secondary | ICD-10-CM | POA: Diagnosis not present

## 2015-07-02 DIAGNOSIS — E1129 Type 2 diabetes mellitus with other diabetic kidney complication: Secondary | ICD-10-CM | POA: Diagnosis not present

## 2015-07-02 DIAGNOSIS — E119 Type 2 diabetes mellitus without complications: Secondary | ICD-10-CM | POA: Diagnosis not present

## 2015-07-02 DIAGNOSIS — D689 Coagulation defect, unspecified: Secondary | ICD-10-CM | POA: Diagnosis not present

## 2015-07-02 DIAGNOSIS — N186 End stage renal disease: Secondary | ICD-10-CM | POA: Diagnosis not present

## 2015-07-02 DIAGNOSIS — D509 Iron deficiency anemia, unspecified: Secondary | ICD-10-CM | POA: Diagnosis not present

## 2015-07-05 DIAGNOSIS — D509 Iron deficiency anemia, unspecified: Secondary | ICD-10-CM | POA: Diagnosis not present

## 2015-07-05 DIAGNOSIS — N186 End stage renal disease: Secondary | ICD-10-CM | POA: Diagnosis not present

## 2015-07-05 DIAGNOSIS — E119 Type 2 diabetes mellitus without complications: Secondary | ICD-10-CM | POA: Diagnosis not present

## 2015-07-05 DIAGNOSIS — D689 Coagulation defect, unspecified: Secondary | ICD-10-CM | POA: Diagnosis not present

## 2015-07-05 DIAGNOSIS — E1129 Type 2 diabetes mellitus with other diabetic kidney complication: Secondary | ICD-10-CM | POA: Diagnosis not present

## 2015-07-07 DIAGNOSIS — E119 Type 2 diabetes mellitus without complications: Secondary | ICD-10-CM | POA: Diagnosis not present

## 2015-07-07 DIAGNOSIS — D689 Coagulation defect, unspecified: Secondary | ICD-10-CM | POA: Diagnosis not present

## 2015-07-07 DIAGNOSIS — N186 End stage renal disease: Secondary | ICD-10-CM | POA: Diagnosis not present

## 2015-07-07 DIAGNOSIS — E1129 Type 2 diabetes mellitus with other diabetic kidney complication: Secondary | ICD-10-CM | POA: Diagnosis not present

## 2015-07-07 DIAGNOSIS — D509 Iron deficiency anemia, unspecified: Secondary | ICD-10-CM | POA: Diagnosis not present

## 2015-07-09 DIAGNOSIS — N186 End stage renal disease: Secondary | ICD-10-CM | POA: Diagnosis not present

## 2015-07-09 DIAGNOSIS — D509 Iron deficiency anemia, unspecified: Secondary | ICD-10-CM | POA: Diagnosis not present

## 2015-07-09 DIAGNOSIS — E119 Type 2 diabetes mellitus without complications: Secondary | ICD-10-CM | POA: Diagnosis not present

## 2015-07-09 DIAGNOSIS — E1129 Type 2 diabetes mellitus with other diabetic kidney complication: Secondary | ICD-10-CM | POA: Diagnosis not present

## 2015-07-09 DIAGNOSIS — D689 Coagulation defect, unspecified: Secondary | ICD-10-CM | POA: Diagnosis not present

## 2015-07-12 DIAGNOSIS — E1129 Type 2 diabetes mellitus with other diabetic kidney complication: Secondary | ICD-10-CM | POA: Diagnosis not present

## 2015-07-12 DIAGNOSIS — N186 End stage renal disease: Secondary | ICD-10-CM | POA: Diagnosis not present

## 2015-07-12 DIAGNOSIS — E119 Type 2 diabetes mellitus without complications: Secondary | ICD-10-CM | POA: Diagnosis not present

## 2015-07-12 DIAGNOSIS — D689 Coagulation defect, unspecified: Secondary | ICD-10-CM | POA: Diagnosis not present

## 2015-07-12 DIAGNOSIS — D509 Iron deficiency anemia, unspecified: Secondary | ICD-10-CM | POA: Diagnosis not present

## 2015-07-14 DIAGNOSIS — E1129 Type 2 diabetes mellitus with other diabetic kidney complication: Secondary | ICD-10-CM | POA: Diagnosis not present

## 2015-07-14 DIAGNOSIS — N186 End stage renal disease: Secondary | ICD-10-CM | POA: Diagnosis not present

## 2015-07-14 DIAGNOSIS — E119 Type 2 diabetes mellitus without complications: Secondary | ICD-10-CM | POA: Diagnosis not present

## 2015-07-14 DIAGNOSIS — D509 Iron deficiency anemia, unspecified: Secondary | ICD-10-CM | POA: Diagnosis not present

## 2015-07-14 DIAGNOSIS — D689 Coagulation defect, unspecified: Secondary | ICD-10-CM | POA: Diagnosis not present

## 2015-07-16 DIAGNOSIS — D509 Iron deficiency anemia, unspecified: Secondary | ICD-10-CM | POA: Diagnosis not present

## 2015-07-16 DIAGNOSIS — E1129 Type 2 diabetes mellitus with other diabetic kidney complication: Secondary | ICD-10-CM | POA: Diagnosis not present

## 2015-07-16 DIAGNOSIS — N186 End stage renal disease: Secondary | ICD-10-CM | POA: Diagnosis not present

## 2015-07-16 DIAGNOSIS — E119 Type 2 diabetes mellitus without complications: Secondary | ICD-10-CM | POA: Diagnosis not present

## 2015-07-16 DIAGNOSIS — D689 Coagulation defect, unspecified: Secondary | ICD-10-CM | POA: Diagnosis not present

## 2015-07-19 DIAGNOSIS — D509 Iron deficiency anemia, unspecified: Secondary | ICD-10-CM | POA: Diagnosis not present

## 2015-07-19 DIAGNOSIS — D689 Coagulation defect, unspecified: Secondary | ICD-10-CM | POA: Diagnosis not present

## 2015-07-19 DIAGNOSIS — E119 Type 2 diabetes mellitus without complications: Secondary | ICD-10-CM | POA: Diagnosis not present

## 2015-07-19 DIAGNOSIS — E1129 Type 2 diabetes mellitus with other diabetic kidney complication: Secondary | ICD-10-CM | POA: Diagnosis not present

## 2015-07-19 DIAGNOSIS — Z992 Dependence on renal dialysis: Secondary | ICD-10-CM | POA: Diagnosis not present

## 2015-07-19 DIAGNOSIS — N186 End stage renal disease: Secondary | ICD-10-CM | POA: Diagnosis not present

## 2015-07-21 DIAGNOSIS — D509 Iron deficiency anemia, unspecified: Secondary | ICD-10-CM | POA: Diagnosis not present

## 2015-07-21 DIAGNOSIS — D631 Anemia in chronic kidney disease: Secondary | ICD-10-CM | POA: Diagnosis not present

## 2015-07-21 DIAGNOSIS — E119 Type 2 diabetes mellitus without complications: Secondary | ICD-10-CM | POA: Diagnosis not present

## 2015-07-21 DIAGNOSIS — E1129 Type 2 diabetes mellitus with other diabetic kidney complication: Secondary | ICD-10-CM | POA: Diagnosis not present

## 2015-07-21 DIAGNOSIS — D689 Coagulation defect, unspecified: Secondary | ICD-10-CM | POA: Diagnosis not present

## 2015-07-21 DIAGNOSIS — R52 Pain, unspecified: Secondary | ICD-10-CM | POA: Diagnosis not present

## 2015-07-21 DIAGNOSIS — N186 End stage renal disease: Secondary | ICD-10-CM | POA: Diagnosis not present

## 2015-07-23 DIAGNOSIS — D509 Iron deficiency anemia, unspecified: Secondary | ICD-10-CM | POA: Diagnosis not present

## 2015-07-23 DIAGNOSIS — E119 Type 2 diabetes mellitus without complications: Secondary | ICD-10-CM | POA: Diagnosis not present

## 2015-07-23 DIAGNOSIS — D631 Anemia in chronic kidney disease: Secondary | ICD-10-CM | POA: Diagnosis not present

## 2015-07-23 DIAGNOSIS — E1129 Type 2 diabetes mellitus with other diabetic kidney complication: Secondary | ICD-10-CM | POA: Diagnosis not present

## 2015-07-23 DIAGNOSIS — R52 Pain, unspecified: Secondary | ICD-10-CM | POA: Diagnosis not present

## 2015-07-23 DIAGNOSIS — N186 End stage renal disease: Secondary | ICD-10-CM | POA: Diagnosis not present

## 2015-07-23 DIAGNOSIS — D689 Coagulation defect, unspecified: Secondary | ICD-10-CM | POA: Diagnosis not present

## 2015-07-26 DIAGNOSIS — E1129 Type 2 diabetes mellitus with other diabetic kidney complication: Secondary | ICD-10-CM | POA: Diagnosis not present

## 2015-07-26 DIAGNOSIS — N186 End stage renal disease: Secondary | ICD-10-CM | POA: Diagnosis not present

## 2015-07-26 DIAGNOSIS — E119 Type 2 diabetes mellitus without complications: Secondary | ICD-10-CM | POA: Diagnosis not present

## 2015-07-26 DIAGNOSIS — D631 Anemia in chronic kidney disease: Secondary | ICD-10-CM | POA: Diagnosis not present

## 2015-07-26 DIAGNOSIS — R52 Pain, unspecified: Secondary | ICD-10-CM | POA: Diagnosis not present

## 2015-07-26 DIAGNOSIS — D689 Coagulation defect, unspecified: Secondary | ICD-10-CM | POA: Diagnosis not present

## 2015-07-26 DIAGNOSIS — D509 Iron deficiency anemia, unspecified: Secondary | ICD-10-CM | POA: Diagnosis not present

## 2015-07-28 DIAGNOSIS — E119 Type 2 diabetes mellitus without complications: Secondary | ICD-10-CM | POA: Diagnosis not present

## 2015-07-28 DIAGNOSIS — D689 Coagulation defect, unspecified: Secondary | ICD-10-CM | POA: Diagnosis not present

## 2015-07-28 DIAGNOSIS — D509 Iron deficiency anemia, unspecified: Secondary | ICD-10-CM | POA: Diagnosis not present

## 2015-07-28 DIAGNOSIS — N186 End stage renal disease: Secondary | ICD-10-CM | POA: Diagnosis not present

## 2015-07-28 DIAGNOSIS — R52 Pain, unspecified: Secondary | ICD-10-CM | POA: Diagnosis not present

## 2015-07-28 DIAGNOSIS — E1129 Type 2 diabetes mellitus with other diabetic kidney complication: Secondary | ICD-10-CM | POA: Diagnosis not present

## 2015-07-28 DIAGNOSIS — D631 Anemia in chronic kidney disease: Secondary | ICD-10-CM | POA: Diagnosis not present

## 2015-07-29 DIAGNOSIS — G894 Chronic pain syndrome: Secondary | ICD-10-CM | POA: Diagnosis not present

## 2015-07-29 DIAGNOSIS — R531 Weakness: Secondary | ICD-10-CM | POA: Diagnosis not present

## 2015-07-29 DIAGNOSIS — J101 Influenza due to other identified influenza virus with other respiratory manifestations: Secondary | ICD-10-CM | POA: Diagnosis not present

## 2015-08-01 DIAGNOSIS — Z5181 Encounter for therapeutic drug level monitoring: Secondary | ICD-10-CM | POA: Diagnosis not present

## 2015-08-02 DIAGNOSIS — D509 Iron deficiency anemia, unspecified: Secondary | ICD-10-CM | POA: Diagnosis not present

## 2015-08-02 DIAGNOSIS — E1129 Type 2 diabetes mellitus with other diabetic kidney complication: Secondary | ICD-10-CM | POA: Diagnosis not present

## 2015-08-02 DIAGNOSIS — E119 Type 2 diabetes mellitus without complications: Secondary | ICD-10-CM | POA: Diagnosis not present

## 2015-08-02 DIAGNOSIS — D689 Coagulation defect, unspecified: Secondary | ICD-10-CM | POA: Diagnosis not present

## 2015-08-02 DIAGNOSIS — D631 Anemia in chronic kidney disease: Secondary | ICD-10-CM | POA: Diagnosis not present

## 2015-08-02 DIAGNOSIS — N186 End stage renal disease: Secondary | ICD-10-CM | POA: Diagnosis not present

## 2015-08-02 DIAGNOSIS — R52 Pain, unspecified: Secondary | ICD-10-CM | POA: Diagnosis not present

## 2015-08-04 DIAGNOSIS — N186 End stage renal disease: Secondary | ICD-10-CM | POA: Diagnosis not present

## 2015-08-04 DIAGNOSIS — D689 Coagulation defect, unspecified: Secondary | ICD-10-CM | POA: Diagnosis not present

## 2015-08-04 DIAGNOSIS — D631 Anemia in chronic kidney disease: Secondary | ICD-10-CM | POA: Diagnosis not present

## 2015-08-04 DIAGNOSIS — R52 Pain, unspecified: Secondary | ICD-10-CM | POA: Diagnosis not present

## 2015-08-04 DIAGNOSIS — E1129 Type 2 diabetes mellitus with other diabetic kidney complication: Secondary | ICD-10-CM | POA: Diagnosis not present

## 2015-08-04 DIAGNOSIS — D509 Iron deficiency anemia, unspecified: Secondary | ICD-10-CM | POA: Diagnosis not present

## 2015-08-04 DIAGNOSIS — E119 Type 2 diabetes mellitus without complications: Secondary | ICD-10-CM | POA: Diagnosis not present

## 2015-08-06 DIAGNOSIS — E1129 Type 2 diabetes mellitus with other diabetic kidney complication: Secondary | ICD-10-CM | POA: Diagnosis not present

## 2015-08-06 DIAGNOSIS — N186 End stage renal disease: Secondary | ICD-10-CM | POA: Diagnosis not present

## 2015-08-06 DIAGNOSIS — D509 Iron deficiency anemia, unspecified: Secondary | ICD-10-CM | POA: Diagnosis not present

## 2015-08-06 DIAGNOSIS — D689 Coagulation defect, unspecified: Secondary | ICD-10-CM | POA: Diagnosis not present

## 2015-08-06 DIAGNOSIS — E119 Type 2 diabetes mellitus without complications: Secondary | ICD-10-CM | POA: Diagnosis not present

## 2015-08-06 DIAGNOSIS — R52 Pain, unspecified: Secondary | ICD-10-CM | POA: Diagnosis not present

## 2015-08-06 DIAGNOSIS — D631 Anemia in chronic kidney disease: Secondary | ICD-10-CM | POA: Diagnosis not present

## 2015-08-09 DIAGNOSIS — D631 Anemia in chronic kidney disease: Secondary | ICD-10-CM | POA: Diagnosis not present

## 2015-08-09 DIAGNOSIS — E1129 Type 2 diabetes mellitus with other diabetic kidney complication: Secondary | ICD-10-CM | POA: Diagnosis not present

## 2015-08-09 DIAGNOSIS — D509 Iron deficiency anemia, unspecified: Secondary | ICD-10-CM | POA: Diagnosis not present

## 2015-08-09 DIAGNOSIS — N186 End stage renal disease: Secondary | ICD-10-CM | POA: Diagnosis not present

## 2015-08-09 DIAGNOSIS — E119 Type 2 diabetes mellitus without complications: Secondary | ICD-10-CM | POA: Diagnosis not present

## 2015-08-09 DIAGNOSIS — R52 Pain, unspecified: Secondary | ICD-10-CM | POA: Diagnosis not present

## 2015-08-09 DIAGNOSIS — D689 Coagulation defect, unspecified: Secondary | ICD-10-CM | POA: Diagnosis not present

## 2015-08-11 DIAGNOSIS — R52 Pain, unspecified: Secondary | ICD-10-CM | POA: Diagnosis not present

## 2015-08-11 DIAGNOSIS — D631 Anemia in chronic kidney disease: Secondary | ICD-10-CM | POA: Diagnosis not present

## 2015-08-11 DIAGNOSIS — N186 End stage renal disease: Secondary | ICD-10-CM | POA: Diagnosis not present

## 2015-08-11 DIAGNOSIS — E119 Type 2 diabetes mellitus without complications: Secondary | ICD-10-CM | POA: Diagnosis not present

## 2015-08-11 DIAGNOSIS — D509 Iron deficiency anemia, unspecified: Secondary | ICD-10-CM | POA: Diagnosis not present

## 2015-08-11 DIAGNOSIS — E1129 Type 2 diabetes mellitus with other diabetic kidney complication: Secondary | ICD-10-CM | POA: Diagnosis not present

## 2015-08-11 DIAGNOSIS — D689 Coagulation defect, unspecified: Secondary | ICD-10-CM | POA: Diagnosis not present

## 2015-08-13 DIAGNOSIS — D689 Coagulation defect, unspecified: Secondary | ICD-10-CM | POA: Diagnosis not present

## 2015-08-13 DIAGNOSIS — E119 Type 2 diabetes mellitus without complications: Secondary | ICD-10-CM | POA: Diagnosis not present

## 2015-08-13 DIAGNOSIS — E1129 Type 2 diabetes mellitus with other diabetic kidney complication: Secondary | ICD-10-CM | POA: Diagnosis not present

## 2015-08-13 DIAGNOSIS — N186 End stage renal disease: Secondary | ICD-10-CM | POA: Diagnosis not present

## 2015-08-13 DIAGNOSIS — D631 Anemia in chronic kidney disease: Secondary | ICD-10-CM | POA: Diagnosis not present

## 2015-08-13 DIAGNOSIS — D509 Iron deficiency anemia, unspecified: Secondary | ICD-10-CM | POA: Diagnosis not present

## 2015-08-13 DIAGNOSIS — R52 Pain, unspecified: Secondary | ICD-10-CM | POA: Diagnosis not present

## 2015-08-15 DIAGNOSIS — Z992 Dependence on renal dialysis: Secondary | ICD-10-CM | POA: Diagnosis not present

## 2015-08-15 DIAGNOSIS — N186 End stage renal disease: Secondary | ICD-10-CM | POA: Diagnosis not present

## 2015-08-15 DIAGNOSIS — T82858A Stenosis of vascular prosthetic devices, implants and grafts, initial encounter: Secondary | ICD-10-CM | POA: Diagnosis not present

## 2015-08-16 DIAGNOSIS — D631 Anemia in chronic kidney disease: Secondary | ICD-10-CM | POA: Diagnosis not present

## 2015-08-16 DIAGNOSIS — D689 Coagulation defect, unspecified: Secondary | ICD-10-CM | POA: Diagnosis not present

## 2015-08-16 DIAGNOSIS — E119 Type 2 diabetes mellitus without complications: Secondary | ICD-10-CM | POA: Diagnosis not present

## 2015-08-16 DIAGNOSIS — R52 Pain, unspecified: Secondary | ICD-10-CM | POA: Diagnosis not present

## 2015-08-16 DIAGNOSIS — D509 Iron deficiency anemia, unspecified: Secondary | ICD-10-CM | POA: Diagnosis not present

## 2015-08-16 DIAGNOSIS — E1129 Type 2 diabetes mellitus with other diabetic kidney complication: Secondary | ICD-10-CM | POA: Diagnosis not present

## 2015-08-16 DIAGNOSIS — N186 End stage renal disease: Secondary | ICD-10-CM | POA: Diagnosis not present

## 2015-08-18 DIAGNOSIS — R52 Pain, unspecified: Secondary | ICD-10-CM | POA: Diagnosis not present

## 2015-08-18 DIAGNOSIS — D509 Iron deficiency anemia, unspecified: Secondary | ICD-10-CM | POA: Diagnosis not present

## 2015-08-18 DIAGNOSIS — D689 Coagulation defect, unspecified: Secondary | ICD-10-CM | POA: Diagnosis not present

## 2015-08-18 DIAGNOSIS — N186 End stage renal disease: Secondary | ICD-10-CM | POA: Diagnosis not present

## 2015-08-18 DIAGNOSIS — D631 Anemia in chronic kidney disease: Secondary | ICD-10-CM | POA: Diagnosis not present

## 2015-08-18 DIAGNOSIS — E1129 Type 2 diabetes mellitus with other diabetic kidney complication: Secondary | ICD-10-CM | POA: Diagnosis not present

## 2015-08-18 DIAGNOSIS — E119 Type 2 diabetes mellitus without complications: Secondary | ICD-10-CM | POA: Diagnosis not present

## 2015-08-19 DIAGNOSIS — Z992 Dependence on renal dialysis: Secondary | ICD-10-CM | POA: Diagnosis not present

## 2015-08-19 DIAGNOSIS — E1129 Type 2 diabetes mellitus with other diabetic kidney complication: Secondary | ICD-10-CM | POA: Diagnosis not present

## 2015-08-19 DIAGNOSIS — N186 End stage renal disease: Secondary | ICD-10-CM | POA: Diagnosis not present

## 2015-08-19 DIAGNOSIS — E11319 Type 2 diabetes mellitus with unspecified diabetic retinopathy without macular edema: Secondary | ICD-10-CM | POA: Diagnosis not present

## 2015-08-20 DIAGNOSIS — E1129 Type 2 diabetes mellitus with other diabetic kidney complication: Secondary | ICD-10-CM | POA: Diagnosis not present

## 2015-08-20 DIAGNOSIS — N186 End stage renal disease: Secondary | ICD-10-CM | POA: Diagnosis not present

## 2015-08-20 DIAGNOSIS — D509 Iron deficiency anemia, unspecified: Secondary | ICD-10-CM | POA: Diagnosis not present

## 2015-08-20 DIAGNOSIS — E119 Type 2 diabetes mellitus without complications: Secondary | ICD-10-CM | POA: Diagnosis not present

## 2015-08-20 DIAGNOSIS — D689 Coagulation defect, unspecified: Secondary | ICD-10-CM | POA: Diagnosis not present

## 2015-08-23 DIAGNOSIS — E1129 Type 2 diabetes mellitus with other diabetic kidney complication: Secondary | ICD-10-CM | POA: Diagnosis not present

## 2015-08-23 DIAGNOSIS — N186 End stage renal disease: Secondary | ICD-10-CM | POA: Diagnosis not present

## 2015-08-23 DIAGNOSIS — D509 Iron deficiency anemia, unspecified: Secondary | ICD-10-CM | POA: Diagnosis not present

## 2015-08-23 DIAGNOSIS — D689 Coagulation defect, unspecified: Secondary | ICD-10-CM | POA: Diagnosis not present

## 2015-08-23 DIAGNOSIS — E119 Type 2 diabetes mellitus without complications: Secondary | ICD-10-CM | POA: Diagnosis not present

## 2015-08-25 DIAGNOSIS — E119 Type 2 diabetes mellitus without complications: Secondary | ICD-10-CM | POA: Diagnosis not present

## 2015-08-25 DIAGNOSIS — D509 Iron deficiency anemia, unspecified: Secondary | ICD-10-CM | POA: Diagnosis not present

## 2015-08-25 DIAGNOSIS — N186 End stage renal disease: Secondary | ICD-10-CM | POA: Diagnosis not present

## 2015-08-25 DIAGNOSIS — E1129 Type 2 diabetes mellitus with other diabetic kidney complication: Secondary | ICD-10-CM | POA: Diagnosis not present

## 2015-08-25 DIAGNOSIS — D689 Coagulation defect, unspecified: Secondary | ICD-10-CM | POA: Diagnosis not present

## 2015-08-26 DIAGNOSIS — J310 Chronic rhinitis: Secondary | ICD-10-CM | POA: Diagnosis not present

## 2015-08-26 DIAGNOSIS — G8929 Other chronic pain: Secondary | ICD-10-CM | POA: Diagnosis not present

## 2015-08-26 DIAGNOSIS — M5441 Lumbago with sciatica, right side: Secondary | ICD-10-CM | POA: Diagnosis not present

## 2015-08-27 DIAGNOSIS — E119 Type 2 diabetes mellitus without complications: Secondary | ICD-10-CM | POA: Diagnosis not present

## 2015-08-27 DIAGNOSIS — E1129 Type 2 diabetes mellitus with other diabetic kidney complication: Secondary | ICD-10-CM | POA: Diagnosis not present

## 2015-08-27 DIAGNOSIS — D509 Iron deficiency anemia, unspecified: Secondary | ICD-10-CM | POA: Diagnosis not present

## 2015-08-27 DIAGNOSIS — D689 Coagulation defect, unspecified: Secondary | ICD-10-CM | POA: Diagnosis not present

## 2015-08-27 DIAGNOSIS — N186 End stage renal disease: Secondary | ICD-10-CM | POA: Diagnosis not present

## 2015-08-30 DIAGNOSIS — E119 Type 2 diabetes mellitus without complications: Secondary | ICD-10-CM | POA: Diagnosis not present

## 2015-08-30 DIAGNOSIS — E1129 Type 2 diabetes mellitus with other diabetic kidney complication: Secondary | ICD-10-CM | POA: Diagnosis not present

## 2015-08-30 DIAGNOSIS — N186 End stage renal disease: Secondary | ICD-10-CM | POA: Diagnosis not present

## 2015-08-30 DIAGNOSIS — D689 Coagulation defect, unspecified: Secondary | ICD-10-CM | POA: Diagnosis not present

## 2015-08-30 DIAGNOSIS — D509 Iron deficiency anemia, unspecified: Secondary | ICD-10-CM | POA: Diagnosis not present

## 2015-09-01 DIAGNOSIS — N186 End stage renal disease: Secondary | ICD-10-CM | POA: Diagnosis not present

## 2015-09-01 DIAGNOSIS — E119 Type 2 diabetes mellitus without complications: Secondary | ICD-10-CM | POA: Diagnosis not present

## 2015-09-01 DIAGNOSIS — E1129 Type 2 diabetes mellitus with other diabetic kidney complication: Secondary | ICD-10-CM | POA: Diagnosis not present

## 2015-09-01 DIAGNOSIS — D689 Coagulation defect, unspecified: Secondary | ICD-10-CM | POA: Diagnosis not present

## 2015-09-01 DIAGNOSIS — D509 Iron deficiency anemia, unspecified: Secondary | ICD-10-CM | POA: Diagnosis not present

## 2015-09-03 DIAGNOSIS — D689 Coagulation defect, unspecified: Secondary | ICD-10-CM | POA: Diagnosis not present

## 2015-09-03 DIAGNOSIS — N186 End stage renal disease: Secondary | ICD-10-CM | POA: Diagnosis not present

## 2015-09-03 DIAGNOSIS — D509 Iron deficiency anemia, unspecified: Secondary | ICD-10-CM | POA: Diagnosis not present

## 2015-09-03 DIAGNOSIS — E1129 Type 2 diabetes mellitus with other diabetic kidney complication: Secondary | ICD-10-CM | POA: Diagnosis not present

## 2015-09-03 DIAGNOSIS — E119 Type 2 diabetes mellitus without complications: Secondary | ICD-10-CM | POA: Diagnosis not present

## 2015-09-06 DIAGNOSIS — E1129 Type 2 diabetes mellitus with other diabetic kidney complication: Secondary | ICD-10-CM | POA: Diagnosis not present

## 2015-09-06 DIAGNOSIS — N186 End stage renal disease: Secondary | ICD-10-CM | POA: Diagnosis not present

## 2015-09-06 DIAGNOSIS — D509 Iron deficiency anemia, unspecified: Secondary | ICD-10-CM | POA: Diagnosis not present

## 2015-09-06 DIAGNOSIS — D689 Coagulation defect, unspecified: Secondary | ICD-10-CM | POA: Diagnosis not present

## 2015-09-06 DIAGNOSIS — E119 Type 2 diabetes mellitus without complications: Secondary | ICD-10-CM | POA: Diagnosis not present

## 2015-09-08 DIAGNOSIS — D509 Iron deficiency anemia, unspecified: Secondary | ICD-10-CM | POA: Diagnosis not present

## 2015-09-08 DIAGNOSIS — N186 End stage renal disease: Secondary | ICD-10-CM | POA: Diagnosis not present

## 2015-09-08 DIAGNOSIS — D689 Coagulation defect, unspecified: Secondary | ICD-10-CM | POA: Diagnosis not present

## 2015-09-08 DIAGNOSIS — E1129 Type 2 diabetes mellitus with other diabetic kidney complication: Secondary | ICD-10-CM | POA: Diagnosis not present

## 2015-09-08 DIAGNOSIS — E119 Type 2 diabetes mellitus without complications: Secondary | ICD-10-CM | POA: Diagnosis not present

## 2015-09-10 DIAGNOSIS — N186 End stage renal disease: Secondary | ICD-10-CM | POA: Diagnosis not present

## 2015-09-10 DIAGNOSIS — E119 Type 2 diabetes mellitus without complications: Secondary | ICD-10-CM | POA: Diagnosis not present

## 2015-09-10 DIAGNOSIS — D689 Coagulation defect, unspecified: Secondary | ICD-10-CM | POA: Diagnosis not present

## 2015-09-10 DIAGNOSIS — E1129 Type 2 diabetes mellitus with other diabetic kidney complication: Secondary | ICD-10-CM | POA: Diagnosis not present

## 2015-09-10 DIAGNOSIS — D509 Iron deficiency anemia, unspecified: Secondary | ICD-10-CM | POA: Diagnosis not present

## 2015-09-13 DIAGNOSIS — D689 Coagulation defect, unspecified: Secondary | ICD-10-CM | POA: Diagnosis not present

## 2015-09-13 DIAGNOSIS — D509 Iron deficiency anemia, unspecified: Secondary | ICD-10-CM | POA: Diagnosis not present

## 2015-09-13 DIAGNOSIS — E1129 Type 2 diabetes mellitus with other diabetic kidney complication: Secondary | ICD-10-CM | POA: Diagnosis not present

## 2015-09-13 DIAGNOSIS — E119 Type 2 diabetes mellitus without complications: Secondary | ICD-10-CM | POA: Diagnosis not present

## 2015-09-13 DIAGNOSIS — N186 End stage renal disease: Secondary | ICD-10-CM | POA: Diagnosis not present

## 2015-09-15 DIAGNOSIS — E1129 Type 2 diabetes mellitus with other diabetic kidney complication: Secondary | ICD-10-CM | POA: Diagnosis not present

## 2015-09-15 DIAGNOSIS — N186 End stage renal disease: Secondary | ICD-10-CM | POA: Diagnosis not present

## 2015-09-15 DIAGNOSIS — D689 Coagulation defect, unspecified: Secondary | ICD-10-CM | POA: Diagnosis not present

## 2015-09-15 DIAGNOSIS — E119 Type 2 diabetes mellitus without complications: Secondary | ICD-10-CM | POA: Diagnosis not present

## 2015-09-15 DIAGNOSIS — D509 Iron deficiency anemia, unspecified: Secondary | ICD-10-CM | POA: Diagnosis not present

## 2015-09-17 DIAGNOSIS — E1129 Type 2 diabetes mellitus with other diabetic kidney complication: Secondary | ICD-10-CM | POA: Diagnosis not present

## 2015-09-17 DIAGNOSIS — N186 End stage renal disease: Secondary | ICD-10-CM | POA: Diagnosis not present

## 2015-09-17 DIAGNOSIS — D689 Coagulation defect, unspecified: Secondary | ICD-10-CM | POA: Diagnosis not present

## 2015-09-17 DIAGNOSIS — D509 Iron deficiency anemia, unspecified: Secondary | ICD-10-CM | POA: Diagnosis not present

## 2015-09-17 DIAGNOSIS — E119 Type 2 diabetes mellitus without complications: Secondary | ICD-10-CM | POA: Diagnosis not present

## 2015-09-18 DIAGNOSIS — N186 End stage renal disease: Secondary | ICD-10-CM | POA: Diagnosis not present

## 2015-09-18 DIAGNOSIS — E1129 Type 2 diabetes mellitus with other diabetic kidney complication: Secondary | ICD-10-CM | POA: Diagnosis not present

## 2015-09-18 DIAGNOSIS — Z992 Dependence on renal dialysis: Secondary | ICD-10-CM | POA: Diagnosis not present

## 2015-09-20 DIAGNOSIS — D689 Coagulation defect, unspecified: Secondary | ICD-10-CM | POA: Diagnosis not present

## 2015-09-20 DIAGNOSIS — R52 Pain, unspecified: Secondary | ICD-10-CM | POA: Diagnosis not present

## 2015-09-20 DIAGNOSIS — E1129 Type 2 diabetes mellitus with other diabetic kidney complication: Secondary | ICD-10-CM | POA: Diagnosis not present

## 2015-09-20 DIAGNOSIS — D631 Anemia in chronic kidney disease: Secondary | ICD-10-CM | POA: Diagnosis not present

## 2015-09-20 DIAGNOSIS — N186 End stage renal disease: Secondary | ICD-10-CM | POA: Diagnosis not present

## 2015-09-20 DIAGNOSIS — E119 Type 2 diabetes mellitus without complications: Secondary | ICD-10-CM | POA: Diagnosis not present

## 2015-09-22 DIAGNOSIS — N186 End stage renal disease: Secondary | ICD-10-CM | POA: Diagnosis not present

## 2015-09-22 DIAGNOSIS — D689 Coagulation defect, unspecified: Secondary | ICD-10-CM | POA: Diagnosis not present

## 2015-09-22 DIAGNOSIS — D631 Anemia in chronic kidney disease: Secondary | ICD-10-CM | POA: Diagnosis not present

## 2015-09-22 DIAGNOSIS — E119 Type 2 diabetes mellitus without complications: Secondary | ICD-10-CM | POA: Diagnosis not present

## 2015-09-22 DIAGNOSIS — E1129 Type 2 diabetes mellitus with other diabetic kidney complication: Secondary | ICD-10-CM | POA: Diagnosis not present

## 2015-09-22 DIAGNOSIS — R52 Pain, unspecified: Secondary | ICD-10-CM | POA: Diagnosis not present

## 2015-09-24 DIAGNOSIS — D631 Anemia in chronic kidney disease: Secondary | ICD-10-CM | POA: Diagnosis not present

## 2015-09-24 DIAGNOSIS — E1129 Type 2 diabetes mellitus with other diabetic kidney complication: Secondary | ICD-10-CM | POA: Diagnosis not present

## 2015-09-24 DIAGNOSIS — E119 Type 2 diabetes mellitus without complications: Secondary | ICD-10-CM | POA: Diagnosis not present

## 2015-09-24 DIAGNOSIS — D689 Coagulation defect, unspecified: Secondary | ICD-10-CM | POA: Diagnosis not present

## 2015-09-24 DIAGNOSIS — R52 Pain, unspecified: Secondary | ICD-10-CM | POA: Diagnosis not present

## 2015-09-24 DIAGNOSIS — N186 End stage renal disease: Secondary | ICD-10-CM | POA: Diagnosis not present

## 2015-09-27 DIAGNOSIS — D689 Coagulation defect, unspecified: Secondary | ICD-10-CM | POA: Diagnosis not present

## 2015-09-27 DIAGNOSIS — N186 End stage renal disease: Secondary | ICD-10-CM | POA: Diagnosis not present

## 2015-09-27 DIAGNOSIS — D631 Anemia in chronic kidney disease: Secondary | ICD-10-CM | POA: Diagnosis not present

## 2015-09-27 DIAGNOSIS — E119 Type 2 diabetes mellitus without complications: Secondary | ICD-10-CM | POA: Diagnosis not present

## 2015-09-27 DIAGNOSIS — R52 Pain, unspecified: Secondary | ICD-10-CM | POA: Diagnosis not present

## 2015-09-27 DIAGNOSIS — E1129 Type 2 diabetes mellitus with other diabetic kidney complication: Secondary | ICD-10-CM | POA: Diagnosis not present

## 2015-09-28 DIAGNOSIS — R252 Cramp and spasm: Secondary | ICD-10-CM | POA: Diagnosis not present

## 2015-09-28 DIAGNOSIS — M5441 Lumbago with sciatica, right side: Secondary | ICD-10-CM | POA: Diagnosis not present

## 2015-09-28 DIAGNOSIS — I10 Essential (primary) hypertension: Secondary | ICD-10-CM | POA: Diagnosis not present

## 2015-09-28 DIAGNOSIS — J310 Chronic rhinitis: Secondary | ICD-10-CM | POA: Diagnosis not present

## 2015-09-28 DIAGNOSIS — G8929 Other chronic pain: Secondary | ICD-10-CM | POA: Diagnosis not present

## 2015-09-29 DIAGNOSIS — R52 Pain, unspecified: Secondary | ICD-10-CM | POA: Diagnosis not present

## 2015-09-29 DIAGNOSIS — E119 Type 2 diabetes mellitus without complications: Secondary | ICD-10-CM | POA: Diagnosis not present

## 2015-09-29 DIAGNOSIS — N186 End stage renal disease: Secondary | ICD-10-CM | POA: Diagnosis not present

## 2015-09-29 DIAGNOSIS — D689 Coagulation defect, unspecified: Secondary | ICD-10-CM | POA: Diagnosis not present

## 2015-09-29 DIAGNOSIS — D631 Anemia in chronic kidney disease: Secondary | ICD-10-CM | POA: Diagnosis not present

## 2015-09-29 DIAGNOSIS — E1129 Type 2 diabetes mellitus with other diabetic kidney complication: Secondary | ICD-10-CM | POA: Diagnosis not present

## 2015-10-01 DIAGNOSIS — D631 Anemia in chronic kidney disease: Secondary | ICD-10-CM | POA: Diagnosis not present

## 2015-10-01 DIAGNOSIS — E1129 Type 2 diabetes mellitus with other diabetic kidney complication: Secondary | ICD-10-CM | POA: Diagnosis not present

## 2015-10-01 DIAGNOSIS — N186 End stage renal disease: Secondary | ICD-10-CM | POA: Diagnosis not present

## 2015-10-01 DIAGNOSIS — D689 Coagulation defect, unspecified: Secondary | ICD-10-CM | POA: Diagnosis not present

## 2015-10-01 DIAGNOSIS — E119 Type 2 diabetes mellitus without complications: Secondary | ICD-10-CM | POA: Diagnosis not present

## 2015-10-01 DIAGNOSIS — R52 Pain, unspecified: Secondary | ICD-10-CM | POA: Diagnosis not present

## 2015-10-04 DIAGNOSIS — D689 Coagulation defect, unspecified: Secondary | ICD-10-CM | POA: Diagnosis not present

## 2015-10-04 DIAGNOSIS — E119 Type 2 diabetes mellitus without complications: Secondary | ICD-10-CM | POA: Diagnosis not present

## 2015-10-04 DIAGNOSIS — D631 Anemia in chronic kidney disease: Secondary | ICD-10-CM | POA: Diagnosis not present

## 2015-10-04 DIAGNOSIS — N186 End stage renal disease: Secondary | ICD-10-CM | POA: Diagnosis not present

## 2015-10-04 DIAGNOSIS — E1129 Type 2 diabetes mellitus with other diabetic kidney complication: Secondary | ICD-10-CM | POA: Diagnosis not present

## 2015-10-04 DIAGNOSIS — R52 Pain, unspecified: Secondary | ICD-10-CM | POA: Diagnosis not present

## 2015-10-05 ENCOUNTER — Encounter: Payer: Self-pay | Admitting: Vascular Surgery

## 2015-10-06 DIAGNOSIS — R52 Pain, unspecified: Secondary | ICD-10-CM | POA: Diagnosis not present

## 2015-10-06 DIAGNOSIS — D689 Coagulation defect, unspecified: Secondary | ICD-10-CM | POA: Diagnosis not present

## 2015-10-06 DIAGNOSIS — N186 End stage renal disease: Secondary | ICD-10-CM | POA: Diagnosis not present

## 2015-10-06 DIAGNOSIS — E119 Type 2 diabetes mellitus without complications: Secondary | ICD-10-CM | POA: Diagnosis not present

## 2015-10-06 DIAGNOSIS — D631 Anemia in chronic kidney disease: Secondary | ICD-10-CM | POA: Diagnosis not present

## 2015-10-06 DIAGNOSIS — E1129 Type 2 diabetes mellitus with other diabetic kidney complication: Secondary | ICD-10-CM | POA: Diagnosis not present

## 2015-10-08 DIAGNOSIS — R52 Pain, unspecified: Secondary | ICD-10-CM | POA: Diagnosis not present

## 2015-10-08 DIAGNOSIS — D631 Anemia in chronic kidney disease: Secondary | ICD-10-CM | POA: Diagnosis not present

## 2015-10-08 DIAGNOSIS — E119 Type 2 diabetes mellitus without complications: Secondary | ICD-10-CM | POA: Diagnosis not present

## 2015-10-08 DIAGNOSIS — D689 Coagulation defect, unspecified: Secondary | ICD-10-CM | POA: Diagnosis not present

## 2015-10-08 DIAGNOSIS — N186 End stage renal disease: Secondary | ICD-10-CM | POA: Diagnosis not present

## 2015-10-08 DIAGNOSIS — E1129 Type 2 diabetes mellitus with other diabetic kidney complication: Secondary | ICD-10-CM | POA: Diagnosis not present

## 2015-10-11 ENCOUNTER — Other Ambulatory Visit: Payer: Self-pay | Admitting: Family Medicine

## 2015-10-11 DIAGNOSIS — D631 Anemia in chronic kidney disease: Secondary | ICD-10-CM | POA: Diagnosis not present

## 2015-10-11 DIAGNOSIS — I770 Arteriovenous fistula, acquired: Secondary | ICD-10-CM

## 2015-10-11 DIAGNOSIS — N186 End stage renal disease: Secondary | ICD-10-CM | POA: Diagnosis not present

## 2015-10-11 DIAGNOSIS — E1129 Type 2 diabetes mellitus with other diabetic kidney complication: Secondary | ICD-10-CM | POA: Diagnosis not present

## 2015-10-11 DIAGNOSIS — R52 Pain, unspecified: Secondary | ICD-10-CM | POA: Diagnosis not present

## 2015-10-11 DIAGNOSIS — E119 Type 2 diabetes mellitus without complications: Secondary | ICD-10-CM | POA: Diagnosis not present

## 2015-10-11 DIAGNOSIS — D689 Coagulation defect, unspecified: Secondary | ICD-10-CM | POA: Diagnosis not present

## 2015-10-12 ENCOUNTER — Ambulatory Visit: Payer: Medicare Other | Admitting: Vascular Surgery

## 2015-10-12 ENCOUNTER — Ambulatory Visit (HOSPITAL_COMMUNITY): Payer: Medicare Other

## 2015-10-13 DIAGNOSIS — D689 Coagulation defect, unspecified: Secondary | ICD-10-CM | POA: Diagnosis not present

## 2015-10-13 DIAGNOSIS — D631 Anemia in chronic kidney disease: Secondary | ICD-10-CM | POA: Diagnosis not present

## 2015-10-13 DIAGNOSIS — R52 Pain, unspecified: Secondary | ICD-10-CM | POA: Diagnosis not present

## 2015-10-13 DIAGNOSIS — E119 Type 2 diabetes mellitus without complications: Secondary | ICD-10-CM | POA: Diagnosis not present

## 2015-10-13 DIAGNOSIS — N186 End stage renal disease: Secondary | ICD-10-CM | POA: Diagnosis not present

## 2015-10-13 DIAGNOSIS — E1129 Type 2 diabetes mellitus with other diabetic kidney complication: Secondary | ICD-10-CM | POA: Diagnosis not present

## 2015-10-15 DIAGNOSIS — E1129 Type 2 diabetes mellitus with other diabetic kidney complication: Secondary | ICD-10-CM | POA: Diagnosis not present

## 2015-10-15 DIAGNOSIS — D631 Anemia in chronic kidney disease: Secondary | ICD-10-CM | POA: Diagnosis not present

## 2015-10-15 DIAGNOSIS — N186 End stage renal disease: Secondary | ICD-10-CM | POA: Diagnosis not present

## 2015-10-15 DIAGNOSIS — E119 Type 2 diabetes mellitus without complications: Secondary | ICD-10-CM | POA: Diagnosis not present

## 2015-10-15 DIAGNOSIS — R52 Pain, unspecified: Secondary | ICD-10-CM | POA: Diagnosis not present

## 2015-10-15 DIAGNOSIS — D689 Coagulation defect, unspecified: Secondary | ICD-10-CM | POA: Diagnosis not present

## 2015-10-18 DIAGNOSIS — N186 End stage renal disease: Secondary | ICD-10-CM | POA: Diagnosis not present

## 2015-10-18 DIAGNOSIS — D689 Coagulation defect, unspecified: Secondary | ICD-10-CM | POA: Diagnosis not present

## 2015-10-18 DIAGNOSIS — R52 Pain, unspecified: Secondary | ICD-10-CM | POA: Diagnosis not present

## 2015-10-18 DIAGNOSIS — E1129 Type 2 diabetes mellitus with other diabetic kidney complication: Secondary | ICD-10-CM | POA: Diagnosis not present

## 2015-10-18 DIAGNOSIS — D631 Anemia in chronic kidney disease: Secondary | ICD-10-CM | POA: Diagnosis not present

## 2015-10-18 DIAGNOSIS — E119 Type 2 diabetes mellitus without complications: Secondary | ICD-10-CM | POA: Diagnosis not present

## 2015-10-19 DIAGNOSIS — N186 End stage renal disease: Secondary | ICD-10-CM | POA: Diagnosis not present

## 2015-10-19 DIAGNOSIS — E1129 Type 2 diabetes mellitus with other diabetic kidney complication: Secondary | ICD-10-CM | POA: Diagnosis not present

## 2015-10-19 DIAGNOSIS — Z992 Dependence on renal dialysis: Secondary | ICD-10-CM | POA: Diagnosis not present

## 2015-10-20 DIAGNOSIS — R52 Pain, unspecified: Secondary | ICD-10-CM | POA: Diagnosis not present

## 2015-10-20 DIAGNOSIS — N186 End stage renal disease: Secondary | ICD-10-CM | POA: Diagnosis not present

## 2015-10-20 DIAGNOSIS — E1129 Type 2 diabetes mellitus with other diabetic kidney complication: Secondary | ICD-10-CM | POA: Diagnosis not present

## 2015-10-20 DIAGNOSIS — D689 Coagulation defect, unspecified: Secondary | ICD-10-CM | POA: Diagnosis not present

## 2015-10-20 DIAGNOSIS — D631 Anemia in chronic kidney disease: Secondary | ICD-10-CM | POA: Diagnosis not present

## 2015-10-20 DIAGNOSIS — E119 Type 2 diabetes mellitus without complications: Secondary | ICD-10-CM | POA: Diagnosis not present

## 2015-10-22 DIAGNOSIS — E119 Type 2 diabetes mellitus without complications: Secondary | ICD-10-CM | POA: Diagnosis not present

## 2015-10-22 DIAGNOSIS — D631 Anemia in chronic kidney disease: Secondary | ICD-10-CM | POA: Diagnosis not present

## 2015-10-22 DIAGNOSIS — N186 End stage renal disease: Secondary | ICD-10-CM | POA: Diagnosis not present

## 2015-10-22 DIAGNOSIS — E1129 Type 2 diabetes mellitus with other diabetic kidney complication: Secondary | ICD-10-CM | POA: Diagnosis not present

## 2015-10-22 DIAGNOSIS — D689 Coagulation defect, unspecified: Secondary | ICD-10-CM | POA: Diagnosis not present

## 2015-10-22 DIAGNOSIS — R52 Pain, unspecified: Secondary | ICD-10-CM | POA: Diagnosis not present

## 2015-10-25 DIAGNOSIS — R52 Pain, unspecified: Secondary | ICD-10-CM | POA: Diagnosis not present

## 2015-10-25 DIAGNOSIS — N186 End stage renal disease: Secondary | ICD-10-CM | POA: Diagnosis not present

## 2015-10-25 DIAGNOSIS — D631 Anemia in chronic kidney disease: Secondary | ICD-10-CM | POA: Diagnosis not present

## 2015-10-25 DIAGNOSIS — E1129 Type 2 diabetes mellitus with other diabetic kidney complication: Secondary | ICD-10-CM | POA: Diagnosis not present

## 2015-10-25 DIAGNOSIS — D689 Coagulation defect, unspecified: Secondary | ICD-10-CM | POA: Diagnosis not present

## 2015-10-25 DIAGNOSIS — E119 Type 2 diabetes mellitus without complications: Secondary | ICD-10-CM | POA: Diagnosis not present

## 2015-10-27 DIAGNOSIS — D689 Coagulation defect, unspecified: Secondary | ICD-10-CM | POA: Diagnosis not present

## 2015-10-27 DIAGNOSIS — E119 Type 2 diabetes mellitus without complications: Secondary | ICD-10-CM | POA: Diagnosis not present

## 2015-10-27 DIAGNOSIS — N186 End stage renal disease: Secondary | ICD-10-CM | POA: Diagnosis not present

## 2015-10-27 DIAGNOSIS — E1129 Type 2 diabetes mellitus with other diabetic kidney complication: Secondary | ICD-10-CM | POA: Diagnosis not present

## 2015-10-27 DIAGNOSIS — R52 Pain, unspecified: Secondary | ICD-10-CM | POA: Diagnosis not present

## 2015-10-27 DIAGNOSIS — D631 Anemia in chronic kidney disease: Secondary | ICD-10-CM | POA: Diagnosis not present

## 2015-10-29 DIAGNOSIS — E119 Type 2 diabetes mellitus without complications: Secondary | ICD-10-CM | POA: Diagnosis not present

## 2015-10-29 DIAGNOSIS — N186 End stage renal disease: Secondary | ICD-10-CM | POA: Diagnosis not present

## 2015-10-29 DIAGNOSIS — E1129 Type 2 diabetes mellitus with other diabetic kidney complication: Secondary | ICD-10-CM | POA: Diagnosis not present

## 2015-10-29 DIAGNOSIS — R52 Pain, unspecified: Secondary | ICD-10-CM | POA: Diagnosis not present

## 2015-10-29 DIAGNOSIS — D631 Anemia in chronic kidney disease: Secondary | ICD-10-CM | POA: Diagnosis not present

## 2015-10-29 DIAGNOSIS — D689 Coagulation defect, unspecified: Secondary | ICD-10-CM | POA: Diagnosis not present

## 2015-11-01 DIAGNOSIS — E1129 Type 2 diabetes mellitus with other diabetic kidney complication: Secondary | ICD-10-CM | POA: Diagnosis not present

## 2015-11-01 DIAGNOSIS — R52 Pain, unspecified: Secondary | ICD-10-CM | POA: Diagnosis not present

## 2015-11-01 DIAGNOSIS — E119 Type 2 diabetes mellitus without complications: Secondary | ICD-10-CM | POA: Diagnosis not present

## 2015-11-01 DIAGNOSIS — D631 Anemia in chronic kidney disease: Secondary | ICD-10-CM | POA: Diagnosis not present

## 2015-11-01 DIAGNOSIS — D689 Coagulation defect, unspecified: Secondary | ICD-10-CM | POA: Diagnosis not present

## 2015-11-01 DIAGNOSIS — N186 End stage renal disease: Secondary | ICD-10-CM | POA: Diagnosis not present

## 2015-11-03 DIAGNOSIS — D689 Coagulation defect, unspecified: Secondary | ICD-10-CM | POA: Diagnosis not present

## 2015-11-03 DIAGNOSIS — D631 Anemia in chronic kidney disease: Secondary | ICD-10-CM | POA: Diagnosis not present

## 2015-11-03 DIAGNOSIS — E119 Type 2 diabetes mellitus without complications: Secondary | ICD-10-CM | POA: Diagnosis not present

## 2015-11-03 DIAGNOSIS — N186 End stage renal disease: Secondary | ICD-10-CM | POA: Diagnosis not present

## 2015-11-03 DIAGNOSIS — R52 Pain, unspecified: Secondary | ICD-10-CM | POA: Diagnosis not present

## 2015-11-03 DIAGNOSIS — E1129 Type 2 diabetes mellitus with other diabetic kidney complication: Secondary | ICD-10-CM | POA: Diagnosis not present

## 2015-11-05 DIAGNOSIS — N186 End stage renal disease: Secondary | ICD-10-CM | POA: Diagnosis not present

## 2015-11-05 DIAGNOSIS — D689 Coagulation defect, unspecified: Secondary | ICD-10-CM | POA: Diagnosis not present

## 2015-11-05 DIAGNOSIS — R52 Pain, unspecified: Secondary | ICD-10-CM | POA: Diagnosis not present

## 2015-11-05 DIAGNOSIS — D631 Anemia in chronic kidney disease: Secondary | ICD-10-CM | POA: Diagnosis not present

## 2015-11-05 DIAGNOSIS — E1129 Type 2 diabetes mellitus with other diabetic kidney complication: Secondary | ICD-10-CM | POA: Diagnosis not present

## 2015-11-05 DIAGNOSIS — E119 Type 2 diabetes mellitus without complications: Secondary | ICD-10-CM | POA: Diagnosis not present

## 2015-11-08 DIAGNOSIS — D631 Anemia in chronic kidney disease: Secondary | ICD-10-CM | POA: Diagnosis not present

## 2015-11-08 DIAGNOSIS — R52 Pain, unspecified: Secondary | ICD-10-CM | POA: Diagnosis not present

## 2015-11-08 DIAGNOSIS — E119 Type 2 diabetes mellitus without complications: Secondary | ICD-10-CM | POA: Diagnosis not present

## 2015-11-08 DIAGNOSIS — N186 End stage renal disease: Secondary | ICD-10-CM | POA: Diagnosis not present

## 2015-11-08 DIAGNOSIS — E1129 Type 2 diabetes mellitus with other diabetic kidney complication: Secondary | ICD-10-CM | POA: Diagnosis not present

## 2015-11-08 DIAGNOSIS — D689 Coagulation defect, unspecified: Secondary | ICD-10-CM | POA: Diagnosis not present

## 2015-11-10 DIAGNOSIS — E1129 Type 2 diabetes mellitus with other diabetic kidney complication: Secondary | ICD-10-CM | POA: Diagnosis not present

## 2015-11-10 DIAGNOSIS — N186 End stage renal disease: Secondary | ICD-10-CM | POA: Diagnosis not present

## 2015-11-10 DIAGNOSIS — D631 Anemia in chronic kidney disease: Secondary | ICD-10-CM | POA: Diagnosis not present

## 2015-11-10 DIAGNOSIS — D689 Coagulation defect, unspecified: Secondary | ICD-10-CM | POA: Diagnosis not present

## 2015-11-10 DIAGNOSIS — E119 Type 2 diabetes mellitus without complications: Secondary | ICD-10-CM | POA: Diagnosis not present

## 2015-11-10 DIAGNOSIS — R52 Pain, unspecified: Secondary | ICD-10-CM | POA: Diagnosis not present

## 2015-11-12 DIAGNOSIS — E1129 Type 2 diabetes mellitus with other diabetic kidney complication: Secondary | ICD-10-CM | POA: Diagnosis not present

## 2015-11-12 DIAGNOSIS — E119 Type 2 diabetes mellitus without complications: Secondary | ICD-10-CM | POA: Diagnosis not present

## 2015-11-12 DIAGNOSIS — D631 Anemia in chronic kidney disease: Secondary | ICD-10-CM | POA: Diagnosis not present

## 2015-11-12 DIAGNOSIS — R52 Pain, unspecified: Secondary | ICD-10-CM | POA: Diagnosis not present

## 2015-11-12 DIAGNOSIS — D689 Coagulation defect, unspecified: Secondary | ICD-10-CM | POA: Diagnosis not present

## 2015-11-12 DIAGNOSIS — N186 End stage renal disease: Secondary | ICD-10-CM | POA: Diagnosis not present

## 2015-11-15 DIAGNOSIS — D631 Anemia in chronic kidney disease: Secondary | ICD-10-CM | POA: Diagnosis not present

## 2015-11-15 DIAGNOSIS — E119 Type 2 diabetes mellitus without complications: Secondary | ICD-10-CM | POA: Diagnosis not present

## 2015-11-15 DIAGNOSIS — E1129 Type 2 diabetes mellitus with other diabetic kidney complication: Secondary | ICD-10-CM | POA: Diagnosis not present

## 2015-11-15 DIAGNOSIS — N186 End stage renal disease: Secondary | ICD-10-CM | POA: Diagnosis not present

## 2015-11-15 DIAGNOSIS — R52 Pain, unspecified: Secondary | ICD-10-CM | POA: Diagnosis not present

## 2015-11-15 DIAGNOSIS — D689 Coagulation defect, unspecified: Secondary | ICD-10-CM | POA: Diagnosis not present

## 2015-11-17 DIAGNOSIS — E119 Type 2 diabetes mellitus without complications: Secondary | ICD-10-CM | POA: Diagnosis not present

## 2015-11-17 DIAGNOSIS — D689 Coagulation defect, unspecified: Secondary | ICD-10-CM | POA: Diagnosis not present

## 2015-11-17 DIAGNOSIS — D631 Anemia in chronic kidney disease: Secondary | ICD-10-CM | POA: Diagnosis not present

## 2015-11-17 DIAGNOSIS — R52 Pain, unspecified: Secondary | ICD-10-CM | POA: Diagnosis not present

## 2015-11-17 DIAGNOSIS — E1129 Type 2 diabetes mellitus with other diabetic kidney complication: Secondary | ICD-10-CM | POA: Diagnosis not present

## 2015-11-17 DIAGNOSIS — N186 End stage renal disease: Secondary | ICD-10-CM | POA: Diagnosis not present

## 2015-11-18 DIAGNOSIS — E1129 Type 2 diabetes mellitus with other diabetic kidney complication: Secondary | ICD-10-CM | POA: Diagnosis not present

## 2015-11-18 DIAGNOSIS — N186 End stage renal disease: Secondary | ICD-10-CM | POA: Diagnosis not present

## 2015-11-18 DIAGNOSIS — Z992 Dependence on renal dialysis: Secondary | ICD-10-CM | POA: Diagnosis not present

## 2015-11-19 DIAGNOSIS — E1129 Type 2 diabetes mellitus with other diabetic kidney complication: Secondary | ICD-10-CM | POA: Diagnosis not present

## 2015-11-19 DIAGNOSIS — E119 Type 2 diabetes mellitus without complications: Secondary | ICD-10-CM | POA: Diagnosis not present

## 2015-11-19 DIAGNOSIS — R52 Pain, unspecified: Secondary | ICD-10-CM | POA: Diagnosis not present

## 2015-11-19 DIAGNOSIS — D689 Coagulation defect, unspecified: Secondary | ICD-10-CM | POA: Diagnosis not present

## 2015-11-19 DIAGNOSIS — N186 End stage renal disease: Secondary | ICD-10-CM | POA: Diagnosis not present

## 2015-11-22 DIAGNOSIS — D689 Coagulation defect, unspecified: Secondary | ICD-10-CM | POA: Diagnosis not present

## 2015-11-22 DIAGNOSIS — R52 Pain, unspecified: Secondary | ICD-10-CM | POA: Diagnosis not present

## 2015-11-22 DIAGNOSIS — E119 Type 2 diabetes mellitus without complications: Secondary | ICD-10-CM | POA: Diagnosis not present

## 2015-11-22 DIAGNOSIS — N186 End stage renal disease: Secondary | ICD-10-CM | POA: Diagnosis not present

## 2015-11-22 DIAGNOSIS — E1129 Type 2 diabetes mellitus with other diabetic kidney complication: Secondary | ICD-10-CM | POA: Diagnosis not present

## 2015-11-24 DIAGNOSIS — E119 Type 2 diabetes mellitus without complications: Secondary | ICD-10-CM | POA: Diagnosis not present

## 2015-11-24 DIAGNOSIS — N186 End stage renal disease: Secondary | ICD-10-CM | POA: Diagnosis not present

## 2015-11-24 DIAGNOSIS — E1129 Type 2 diabetes mellitus with other diabetic kidney complication: Secondary | ICD-10-CM | POA: Diagnosis not present

## 2015-11-24 DIAGNOSIS — R52 Pain, unspecified: Secondary | ICD-10-CM | POA: Diagnosis not present

## 2015-11-24 DIAGNOSIS — D689 Coagulation defect, unspecified: Secondary | ICD-10-CM | POA: Diagnosis not present

## 2015-11-26 DIAGNOSIS — R52 Pain, unspecified: Secondary | ICD-10-CM | POA: Diagnosis not present

## 2015-11-26 DIAGNOSIS — E119 Type 2 diabetes mellitus without complications: Secondary | ICD-10-CM | POA: Diagnosis not present

## 2015-11-26 DIAGNOSIS — N186 End stage renal disease: Secondary | ICD-10-CM | POA: Diagnosis not present

## 2015-11-26 DIAGNOSIS — D689 Coagulation defect, unspecified: Secondary | ICD-10-CM | POA: Diagnosis not present

## 2015-11-26 DIAGNOSIS — E1129 Type 2 diabetes mellitus with other diabetic kidney complication: Secondary | ICD-10-CM | POA: Diagnosis not present

## 2015-11-28 DIAGNOSIS — Z5181 Encounter for therapeutic drug level monitoring: Secondary | ICD-10-CM | POA: Diagnosis not present

## 2015-11-28 DIAGNOSIS — M5441 Lumbago with sciatica, right side: Secondary | ICD-10-CM | POA: Diagnosis not present

## 2015-11-28 DIAGNOSIS — B070 Plantar wart: Secondary | ICD-10-CM | POA: Diagnosis not present

## 2015-11-28 DIAGNOSIS — R252 Cramp and spasm: Secondary | ICD-10-CM | POA: Diagnosis not present

## 2015-11-28 DIAGNOSIS — I10 Essential (primary) hypertension: Secondary | ICD-10-CM | POA: Diagnosis not present

## 2015-11-28 DIAGNOSIS — G8929 Other chronic pain: Secondary | ICD-10-CM | POA: Diagnosis not present

## 2015-11-29 DIAGNOSIS — E1129 Type 2 diabetes mellitus with other diabetic kidney complication: Secondary | ICD-10-CM | POA: Diagnosis not present

## 2015-11-29 DIAGNOSIS — E119 Type 2 diabetes mellitus without complications: Secondary | ICD-10-CM | POA: Diagnosis not present

## 2015-11-29 DIAGNOSIS — D689 Coagulation defect, unspecified: Secondary | ICD-10-CM | POA: Diagnosis not present

## 2015-11-29 DIAGNOSIS — N186 End stage renal disease: Secondary | ICD-10-CM | POA: Diagnosis not present

## 2015-11-29 DIAGNOSIS — R52 Pain, unspecified: Secondary | ICD-10-CM | POA: Diagnosis not present

## 2015-12-01 DIAGNOSIS — E1129 Type 2 diabetes mellitus with other diabetic kidney complication: Secondary | ICD-10-CM | POA: Diagnosis not present

## 2015-12-01 DIAGNOSIS — E119 Type 2 diabetes mellitus without complications: Secondary | ICD-10-CM | POA: Diagnosis not present

## 2015-12-01 DIAGNOSIS — N186 End stage renal disease: Secondary | ICD-10-CM | POA: Diagnosis not present

## 2015-12-01 DIAGNOSIS — D689 Coagulation defect, unspecified: Secondary | ICD-10-CM | POA: Diagnosis not present

## 2015-12-01 DIAGNOSIS — R52 Pain, unspecified: Secondary | ICD-10-CM | POA: Diagnosis not present

## 2015-12-03 DIAGNOSIS — E1129 Type 2 diabetes mellitus with other diabetic kidney complication: Secondary | ICD-10-CM | POA: Diagnosis not present

## 2015-12-03 DIAGNOSIS — E119 Type 2 diabetes mellitus without complications: Secondary | ICD-10-CM | POA: Diagnosis not present

## 2015-12-03 DIAGNOSIS — D689 Coagulation defect, unspecified: Secondary | ICD-10-CM | POA: Diagnosis not present

## 2015-12-03 DIAGNOSIS — R52 Pain, unspecified: Secondary | ICD-10-CM | POA: Diagnosis not present

## 2015-12-03 DIAGNOSIS — N186 End stage renal disease: Secondary | ICD-10-CM | POA: Diagnosis not present

## 2015-12-06 DIAGNOSIS — N186 End stage renal disease: Secondary | ICD-10-CM | POA: Diagnosis not present

## 2015-12-06 DIAGNOSIS — E119 Type 2 diabetes mellitus without complications: Secondary | ICD-10-CM | POA: Diagnosis not present

## 2015-12-06 DIAGNOSIS — E1129 Type 2 diabetes mellitus with other diabetic kidney complication: Secondary | ICD-10-CM | POA: Diagnosis not present

## 2015-12-06 DIAGNOSIS — D689 Coagulation defect, unspecified: Secondary | ICD-10-CM | POA: Diagnosis not present

## 2015-12-06 DIAGNOSIS — R52 Pain, unspecified: Secondary | ICD-10-CM | POA: Diagnosis not present

## 2015-12-08 DIAGNOSIS — N186 End stage renal disease: Secondary | ICD-10-CM | POA: Diagnosis not present

## 2015-12-08 DIAGNOSIS — R52 Pain, unspecified: Secondary | ICD-10-CM | POA: Diagnosis not present

## 2015-12-08 DIAGNOSIS — E1129 Type 2 diabetes mellitus with other diabetic kidney complication: Secondary | ICD-10-CM | POA: Diagnosis not present

## 2015-12-08 DIAGNOSIS — E119 Type 2 diabetes mellitus without complications: Secondary | ICD-10-CM | POA: Diagnosis not present

## 2015-12-08 DIAGNOSIS — D689 Coagulation defect, unspecified: Secondary | ICD-10-CM | POA: Diagnosis not present

## 2015-12-10 DIAGNOSIS — D689 Coagulation defect, unspecified: Secondary | ICD-10-CM | POA: Diagnosis not present

## 2015-12-10 DIAGNOSIS — E1129 Type 2 diabetes mellitus with other diabetic kidney complication: Secondary | ICD-10-CM | POA: Diagnosis not present

## 2015-12-10 DIAGNOSIS — N186 End stage renal disease: Secondary | ICD-10-CM | POA: Diagnosis not present

## 2015-12-10 DIAGNOSIS — R52 Pain, unspecified: Secondary | ICD-10-CM | POA: Diagnosis not present

## 2015-12-10 DIAGNOSIS — E119 Type 2 diabetes mellitus without complications: Secondary | ICD-10-CM | POA: Diagnosis not present

## 2015-12-13 DIAGNOSIS — D689 Coagulation defect, unspecified: Secondary | ICD-10-CM | POA: Diagnosis not present

## 2015-12-13 DIAGNOSIS — E1129 Type 2 diabetes mellitus with other diabetic kidney complication: Secondary | ICD-10-CM | POA: Diagnosis not present

## 2015-12-13 DIAGNOSIS — N186 End stage renal disease: Secondary | ICD-10-CM | POA: Diagnosis not present

## 2015-12-13 DIAGNOSIS — E119 Type 2 diabetes mellitus without complications: Secondary | ICD-10-CM | POA: Diagnosis not present

## 2015-12-13 DIAGNOSIS — R52 Pain, unspecified: Secondary | ICD-10-CM | POA: Diagnosis not present

## 2015-12-15 DIAGNOSIS — R52 Pain, unspecified: Secondary | ICD-10-CM | POA: Diagnosis not present

## 2015-12-15 DIAGNOSIS — D689 Coagulation defect, unspecified: Secondary | ICD-10-CM | POA: Diagnosis not present

## 2015-12-15 DIAGNOSIS — E1129 Type 2 diabetes mellitus with other diabetic kidney complication: Secondary | ICD-10-CM | POA: Diagnosis not present

## 2015-12-15 DIAGNOSIS — E119 Type 2 diabetes mellitus without complications: Secondary | ICD-10-CM | POA: Diagnosis not present

## 2015-12-15 DIAGNOSIS — N186 End stage renal disease: Secondary | ICD-10-CM | POA: Diagnosis not present

## 2015-12-17 DIAGNOSIS — R52 Pain, unspecified: Secondary | ICD-10-CM | POA: Diagnosis not present

## 2015-12-17 DIAGNOSIS — N186 End stage renal disease: Secondary | ICD-10-CM | POA: Diagnosis not present

## 2015-12-17 DIAGNOSIS — D689 Coagulation defect, unspecified: Secondary | ICD-10-CM | POA: Diagnosis not present

## 2015-12-17 DIAGNOSIS — E1129 Type 2 diabetes mellitus with other diabetic kidney complication: Secondary | ICD-10-CM | POA: Diagnosis not present

## 2015-12-17 DIAGNOSIS — E119 Type 2 diabetes mellitus without complications: Secondary | ICD-10-CM | POA: Diagnosis not present

## 2015-12-19 DIAGNOSIS — Z992 Dependence on renal dialysis: Secondary | ICD-10-CM | POA: Diagnosis not present

## 2015-12-19 DIAGNOSIS — N186 End stage renal disease: Secondary | ICD-10-CM | POA: Diagnosis not present

## 2015-12-19 DIAGNOSIS — E1129 Type 2 diabetes mellitus with other diabetic kidney complication: Secondary | ICD-10-CM | POA: Diagnosis not present

## 2015-12-20 DIAGNOSIS — E119 Type 2 diabetes mellitus without complications: Secondary | ICD-10-CM | POA: Diagnosis not present

## 2015-12-20 DIAGNOSIS — N186 End stage renal disease: Secondary | ICD-10-CM | POA: Diagnosis not present

## 2015-12-20 DIAGNOSIS — D631 Anemia in chronic kidney disease: Secondary | ICD-10-CM | POA: Diagnosis not present

## 2015-12-20 DIAGNOSIS — R52 Pain, unspecified: Secondary | ICD-10-CM | POA: Diagnosis not present

## 2015-12-20 DIAGNOSIS — D689 Coagulation defect, unspecified: Secondary | ICD-10-CM | POA: Diagnosis not present

## 2015-12-20 DIAGNOSIS — N2581 Secondary hyperparathyroidism of renal origin: Secondary | ICD-10-CM | POA: Diagnosis not present

## 2015-12-20 DIAGNOSIS — E1129 Type 2 diabetes mellitus with other diabetic kidney complication: Secondary | ICD-10-CM | POA: Diagnosis not present

## 2015-12-22 DIAGNOSIS — E119 Type 2 diabetes mellitus without complications: Secondary | ICD-10-CM | POA: Diagnosis not present

## 2015-12-22 DIAGNOSIS — N2581 Secondary hyperparathyroidism of renal origin: Secondary | ICD-10-CM | POA: Diagnosis not present

## 2015-12-22 DIAGNOSIS — R52 Pain, unspecified: Secondary | ICD-10-CM | POA: Diagnosis not present

## 2015-12-22 DIAGNOSIS — E1129 Type 2 diabetes mellitus with other diabetic kidney complication: Secondary | ICD-10-CM | POA: Diagnosis not present

## 2015-12-22 DIAGNOSIS — D689 Coagulation defect, unspecified: Secondary | ICD-10-CM | POA: Diagnosis not present

## 2015-12-22 DIAGNOSIS — D631 Anemia in chronic kidney disease: Secondary | ICD-10-CM | POA: Diagnosis not present

## 2015-12-22 DIAGNOSIS — N186 End stage renal disease: Secondary | ICD-10-CM | POA: Diagnosis not present

## 2015-12-24 DIAGNOSIS — E1129 Type 2 diabetes mellitus with other diabetic kidney complication: Secondary | ICD-10-CM | POA: Diagnosis not present

## 2015-12-24 DIAGNOSIS — E119 Type 2 diabetes mellitus without complications: Secondary | ICD-10-CM | POA: Diagnosis not present

## 2015-12-24 DIAGNOSIS — N2581 Secondary hyperparathyroidism of renal origin: Secondary | ICD-10-CM | POA: Diagnosis not present

## 2015-12-24 DIAGNOSIS — N186 End stage renal disease: Secondary | ICD-10-CM | POA: Diagnosis not present

## 2015-12-24 DIAGNOSIS — R52 Pain, unspecified: Secondary | ICD-10-CM | POA: Diagnosis not present

## 2015-12-24 DIAGNOSIS — D689 Coagulation defect, unspecified: Secondary | ICD-10-CM | POA: Diagnosis not present

## 2015-12-24 DIAGNOSIS — D631 Anemia in chronic kidney disease: Secondary | ICD-10-CM | POA: Diagnosis not present

## 2015-12-27 DIAGNOSIS — R52 Pain, unspecified: Secondary | ICD-10-CM | POA: Diagnosis not present

## 2015-12-27 DIAGNOSIS — E119 Type 2 diabetes mellitus without complications: Secondary | ICD-10-CM | POA: Diagnosis not present

## 2015-12-27 DIAGNOSIS — N186 End stage renal disease: Secondary | ICD-10-CM | POA: Diagnosis not present

## 2015-12-27 DIAGNOSIS — D631 Anemia in chronic kidney disease: Secondary | ICD-10-CM | POA: Diagnosis not present

## 2015-12-27 DIAGNOSIS — N2581 Secondary hyperparathyroidism of renal origin: Secondary | ICD-10-CM | POA: Diagnosis not present

## 2015-12-27 DIAGNOSIS — D689 Coagulation defect, unspecified: Secondary | ICD-10-CM | POA: Diagnosis not present

## 2015-12-27 DIAGNOSIS — E1129 Type 2 diabetes mellitus with other diabetic kidney complication: Secondary | ICD-10-CM | POA: Diagnosis not present

## 2015-12-28 DIAGNOSIS — I10 Essential (primary) hypertension: Secondary | ICD-10-CM | POA: Diagnosis not present

## 2015-12-28 DIAGNOSIS — M5441 Lumbago with sciatica, right side: Secondary | ICD-10-CM | POA: Diagnosis not present

## 2015-12-28 DIAGNOSIS — R252 Cramp and spasm: Secondary | ICD-10-CM | POA: Diagnosis not present

## 2015-12-28 DIAGNOSIS — Z5181 Encounter for therapeutic drug level monitoring: Secondary | ICD-10-CM | POA: Diagnosis not present

## 2015-12-28 DIAGNOSIS — B070 Plantar wart: Secondary | ICD-10-CM | POA: Diagnosis not present

## 2015-12-28 DIAGNOSIS — G8929 Other chronic pain: Secondary | ICD-10-CM | POA: Diagnosis not present

## 2015-12-31 DIAGNOSIS — D689 Coagulation defect, unspecified: Secondary | ICD-10-CM | POA: Diagnosis not present

## 2015-12-31 DIAGNOSIS — E119 Type 2 diabetes mellitus without complications: Secondary | ICD-10-CM | POA: Diagnosis not present

## 2015-12-31 DIAGNOSIS — N2581 Secondary hyperparathyroidism of renal origin: Secondary | ICD-10-CM | POA: Diagnosis not present

## 2015-12-31 DIAGNOSIS — E1129 Type 2 diabetes mellitus with other diabetic kidney complication: Secondary | ICD-10-CM | POA: Diagnosis not present

## 2015-12-31 DIAGNOSIS — R52 Pain, unspecified: Secondary | ICD-10-CM | POA: Diagnosis not present

## 2015-12-31 DIAGNOSIS — N186 End stage renal disease: Secondary | ICD-10-CM | POA: Diagnosis not present

## 2015-12-31 DIAGNOSIS — D631 Anemia in chronic kidney disease: Secondary | ICD-10-CM | POA: Diagnosis not present

## 2016-01-03 DIAGNOSIS — E1129 Type 2 diabetes mellitus with other diabetic kidney complication: Secondary | ICD-10-CM | POA: Diagnosis not present

## 2016-01-03 DIAGNOSIS — N186 End stage renal disease: Secondary | ICD-10-CM | POA: Diagnosis not present

## 2016-01-03 DIAGNOSIS — N2581 Secondary hyperparathyroidism of renal origin: Secondary | ICD-10-CM | POA: Diagnosis not present

## 2016-01-03 DIAGNOSIS — R52 Pain, unspecified: Secondary | ICD-10-CM | POA: Diagnosis not present

## 2016-01-03 DIAGNOSIS — E119 Type 2 diabetes mellitus without complications: Secondary | ICD-10-CM | POA: Diagnosis not present

## 2016-01-03 DIAGNOSIS — D689 Coagulation defect, unspecified: Secondary | ICD-10-CM | POA: Diagnosis not present

## 2016-01-03 DIAGNOSIS — D631 Anemia in chronic kidney disease: Secondary | ICD-10-CM | POA: Diagnosis not present

## 2016-01-05 DIAGNOSIS — D689 Coagulation defect, unspecified: Secondary | ICD-10-CM | POA: Diagnosis not present

## 2016-01-05 DIAGNOSIS — R52 Pain, unspecified: Secondary | ICD-10-CM | POA: Diagnosis not present

## 2016-01-05 DIAGNOSIS — E119 Type 2 diabetes mellitus without complications: Secondary | ICD-10-CM | POA: Diagnosis not present

## 2016-01-05 DIAGNOSIS — N2581 Secondary hyperparathyroidism of renal origin: Secondary | ICD-10-CM | POA: Diagnosis not present

## 2016-01-05 DIAGNOSIS — E1129 Type 2 diabetes mellitus with other diabetic kidney complication: Secondary | ICD-10-CM | POA: Diagnosis not present

## 2016-01-05 DIAGNOSIS — N186 End stage renal disease: Secondary | ICD-10-CM | POA: Diagnosis not present

## 2016-01-05 DIAGNOSIS — D631 Anemia in chronic kidney disease: Secondary | ICD-10-CM | POA: Diagnosis not present

## 2016-01-07 DIAGNOSIS — E119 Type 2 diabetes mellitus without complications: Secondary | ICD-10-CM | POA: Diagnosis not present

## 2016-01-07 DIAGNOSIS — R52 Pain, unspecified: Secondary | ICD-10-CM | POA: Diagnosis not present

## 2016-01-07 DIAGNOSIS — E1129 Type 2 diabetes mellitus with other diabetic kidney complication: Secondary | ICD-10-CM | POA: Diagnosis not present

## 2016-01-07 DIAGNOSIS — D631 Anemia in chronic kidney disease: Secondary | ICD-10-CM | POA: Diagnosis not present

## 2016-01-07 DIAGNOSIS — N186 End stage renal disease: Secondary | ICD-10-CM | POA: Diagnosis not present

## 2016-01-07 DIAGNOSIS — N2581 Secondary hyperparathyroidism of renal origin: Secondary | ICD-10-CM | POA: Diagnosis not present

## 2016-01-07 DIAGNOSIS — D689 Coagulation defect, unspecified: Secondary | ICD-10-CM | POA: Diagnosis not present

## 2016-01-10 DIAGNOSIS — N186 End stage renal disease: Secondary | ICD-10-CM | POA: Diagnosis not present

## 2016-01-10 DIAGNOSIS — D631 Anemia in chronic kidney disease: Secondary | ICD-10-CM | POA: Diagnosis not present

## 2016-01-10 DIAGNOSIS — R52 Pain, unspecified: Secondary | ICD-10-CM | POA: Diagnosis not present

## 2016-01-10 DIAGNOSIS — N2581 Secondary hyperparathyroidism of renal origin: Secondary | ICD-10-CM | POA: Diagnosis not present

## 2016-01-10 DIAGNOSIS — E119 Type 2 diabetes mellitus without complications: Secondary | ICD-10-CM | POA: Diagnosis not present

## 2016-01-10 DIAGNOSIS — D689 Coagulation defect, unspecified: Secondary | ICD-10-CM | POA: Diagnosis not present

## 2016-01-10 DIAGNOSIS — E1129 Type 2 diabetes mellitus with other diabetic kidney complication: Secondary | ICD-10-CM | POA: Diagnosis not present

## 2016-01-12 DIAGNOSIS — D631 Anemia in chronic kidney disease: Secondary | ICD-10-CM | POA: Diagnosis not present

## 2016-01-12 DIAGNOSIS — D689 Coagulation defect, unspecified: Secondary | ICD-10-CM | POA: Diagnosis not present

## 2016-01-12 DIAGNOSIS — N186 End stage renal disease: Secondary | ICD-10-CM | POA: Diagnosis not present

## 2016-01-12 DIAGNOSIS — N2581 Secondary hyperparathyroidism of renal origin: Secondary | ICD-10-CM | POA: Diagnosis not present

## 2016-01-12 DIAGNOSIS — E1129 Type 2 diabetes mellitus with other diabetic kidney complication: Secondary | ICD-10-CM | POA: Diagnosis not present

## 2016-01-12 DIAGNOSIS — R52 Pain, unspecified: Secondary | ICD-10-CM | POA: Diagnosis not present

## 2016-01-12 DIAGNOSIS — E119 Type 2 diabetes mellitus without complications: Secondary | ICD-10-CM | POA: Diagnosis not present

## 2016-01-14 DIAGNOSIS — R52 Pain, unspecified: Secondary | ICD-10-CM | POA: Diagnosis not present

## 2016-01-14 DIAGNOSIS — N186 End stage renal disease: Secondary | ICD-10-CM | POA: Diagnosis not present

## 2016-01-14 DIAGNOSIS — E1129 Type 2 diabetes mellitus with other diabetic kidney complication: Secondary | ICD-10-CM | POA: Diagnosis not present

## 2016-01-14 DIAGNOSIS — D689 Coagulation defect, unspecified: Secondary | ICD-10-CM | POA: Diagnosis not present

## 2016-01-14 DIAGNOSIS — E119 Type 2 diabetes mellitus without complications: Secondary | ICD-10-CM | POA: Diagnosis not present

## 2016-01-14 DIAGNOSIS — N2581 Secondary hyperparathyroidism of renal origin: Secondary | ICD-10-CM | POA: Diagnosis not present

## 2016-01-14 DIAGNOSIS — D631 Anemia in chronic kidney disease: Secondary | ICD-10-CM | POA: Diagnosis not present

## 2016-01-17 DIAGNOSIS — E1129 Type 2 diabetes mellitus with other diabetic kidney complication: Secondary | ICD-10-CM | POA: Diagnosis not present

## 2016-01-17 DIAGNOSIS — D689 Coagulation defect, unspecified: Secondary | ICD-10-CM | POA: Diagnosis not present

## 2016-01-17 DIAGNOSIS — E119 Type 2 diabetes mellitus without complications: Secondary | ICD-10-CM | POA: Diagnosis not present

## 2016-01-17 DIAGNOSIS — N2581 Secondary hyperparathyroidism of renal origin: Secondary | ICD-10-CM | POA: Diagnosis not present

## 2016-01-17 DIAGNOSIS — D631 Anemia in chronic kidney disease: Secondary | ICD-10-CM | POA: Diagnosis not present

## 2016-01-17 DIAGNOSIS — R52 Pain, unspecified: Secondary | ICD-10-CM | POA: Diagnosis not present

## 2016-01-17 DIAGNOSIS — N186 End stage renal disease: Secondary | ICD-10-CM | POA: Diagnosis not present

## 2016-01-19 DIAGNOSIS — N186 End stage renal disease: Secondary | ICD-10-CM | POA: Diagnosis not present

## 2016-01-19 DIAGNOSIS — D631 Anemia in chronic kidney disease: Secondary | ICD-10-CM | POA: Diagnosis not present

## 2016-01-19 DIAGNOSIS — N2581 Secondary hyperparathyroidism of renal origin: Secondary | ICD-10-CM | POA: Diagnosis not present

## 2016-01-19 DIAGNOSIS — E119 Type 2 diabetes mellitus without complications: Secondary | ICD-10-CM | POA: Diagnosis not present

## 2016-01-19 DIAGNOSIS — Z992 Dependence on renal dialysis: Secondary | ICD-10-CM | POA: Diagnosis not present

## 2016-01-19 DIAGNOSIS — E1129 Type 2 diabetes mellitus with other diabetic kidney complication: Secondary | ICD-10-CM | POA: Diagnosis not present

## 2016-01-19 DIAGNOSIS — D689 Coagulation defect, unspecified: Secondary | ICD-10-CM | POA: Diagnosis not present

## 2016-01-19 DIAGNOSIS — R52 Pain, unspecified: Secondary | ICD-10-CM | POA: Diagnosis not present

## 2016-01-21 DIAGNOSIS — E1129 Type 2 diabetes mellitus with other diabetic kidney complication: Secondary | ICD-10-CM | POA: Diagnosis not present

## 2016-01-21 DIAGNOSIS — R52 Pain, unspecified: Secondary | ICD-10-CM | POA: Diagnosis not present

## 2016-01-21 DIAGNOSIS — N186 End stage renal disease: Secondary | ICD-10-CM | POA: Diagnosis not present

## 2016-01-21 DIAGNOSIS — E119 Type 2 diabetes mellitus without complications: Secondary | ICD-10-CM | POA: Diagnosis not present

## 2016-01-21 DIAGNOSIS — D689 Coagulation defect, unspecified: Secondary | ICD-10-CM | POA: Diagnosis not present

## 2016-01-24 DIAGNOSIS — E119 Type 2 diabetes mellitus without complications: Secondary | ICD-10-CM | POA: Diagnosis not present

## 2016-01-24 DIAGNOSIS — E1129 Type 2 diabetes mellitus with other diabetic kidney complication: Secondary | ICD-10-CM | POA: Diagnosis not present

## 2016-01-24 DIAGNOSIS — N186 End stage renal disease: Secondary | ICD-10-CM | POA: Diagnosis not present

## 2016-01-24 DIAGNOSIS — D689 Coagulation defect, unspecified: Secondary | ICD-10-CM | POA: Diagnosis not present

## 2016-01-24 DIAGNOSIS — R52 Pain, unspecified: Secondary | ICD-10-CM | POA: Diagnosis not present

## 2016-01-25 DIAGNOSIS — Z5181 Encounter for therapeutic drug level monitoring: Secondary | ICD-10-CM | POA: Diagnosis not present

## 2016-01-25 DIAGNOSIS — M5441 Lumbago with sciatica, right side: Secondary | ICD-10-CM | POA: Diagnosis not present

## 2016-01-25 DIAGNOSIS — Z79899 Other long term (current) drug therapy: Secondary | ICD-10-CM | POA: Diagnosis not present

## 2016-01-25 DIAGNOSIS — G8929 Other chronic pain: Secondary | ICD-10-CM | POA: Diagnosis not present

## 2016-01-25 DIAGNOSIS — I10 Essential (primary) hypertension: Secondary | ICD-10-CM | POA: Diagnosis not present

## 2016-01-26 DIAGNOSIS — E119 Type 2 diabetes mellitus without complications: Secondary | ICD-10-CM | POA: Diagnosis not present

## 2016-01-26 DIAGNOSIS — R52 Pain, unspecified: Secondary | ICD-10-CM | POA: Diagnosis not present

## 2016-01-26 DIAGNOSIS — E1129 Type 2 diabetes mellitus with other diabetic kidney complication: Secondary | ICD-10-CM | POA: Diagnosis not present

## 2016-01-26 DIAGNOSIS — N186 End stage renal disease: Secondary | ICD-10-CM | POA: Diagnosis not present

## 2016-01-26 DIAGNOSIS — D689 Coagulation defect, unspecified: Secondary | ICD-10-CM | POA: Diagnosis not present

## 2016-01-28 DIAGNOSIS — E119 Type 2 diabetes mellitus without complications: Secondary | ICD-10-CM | POA: Diagnosis not present

## 2016-01-28 DIAGNOSIS — R52 Pain, unspecified: Secondary | ICD-10-CM | POA: Diagnosis not present

## 2016-01-28 DIAGNOSIS — D689 Coagulation defect, unspecified: Secondary | ICD-10-CM | POA: Diagnosis not present

## 2016-01-28 DIAGNOSIS — E1129 Type 2 diabetes mellitus with other diabetic kidney complication: Secondary | ICD-10-CM | POA: Diagnosis not present

## 2016-01-28 DIAGNOSIS — N186 End stage renal disease: Secondary | ICD-10-CM | POA: Diagnosis not present

## 2016-01-30 DIAGNOSIS — N186 End stage renal disease: Secondary | ICD-10-CM | POA: Diagnosis not present

## 2016-01-30 DIAGNOSIS — T82898A Other specified complication of vascular prosthetic devices, implants and grafts, initial encounter: Secondary | ICD-10-CM | POA: Diagnosis not present

## 2016-01-30 DIAGNOSIS — T82858A Stenosis of vascular prosthetic devices, implants and grafts, initial encounter: Secondary | ICD-10-CM | POA: Diagnosis not present

## 2016-01-31 DIAGNOSIS — N186 End stage renal disease: Secondary | ICD-10-CM | POA: Diagnosis not present

## 2016-01-31 DIAGNOSIS — R52 Pain, unspecified: Secondary | ICD-10-CM | POA: Diagnosis not present

## 2016-01-31 DIAGNOSIS — E119 Type 2 diabetes mellitus without complications: Secondary | ICD-10-CM | POA: Diagnosis not present

## 2016-01-31 DIAGNOSIS — D689 Coagulation defect, unspecified: Secondary | ICD-10-CM | POA: Diagnosis not present

## 2016-01-31 DIAGNOSIS — E1129 Type 2 diabetes mellitus with other diabetic kidney complication: Secondary | ICD-10-CM | POA: Diagnosis not present

## 2016-02-02 DIAGNOSIS — E1129 Type 2 diabetes mellitus with other diabetic kidney complication: Secondary | ICD-10-CM | POA: Diagnosis not present

## 2016-02-02 DIAGNOSIS — D689 Coagulation defect, unspecified: Secondary | ICD-10-CM | POA: Diagnosis not present

## 2016-02-02 DIAGNOSIS — N186 End stage renal disease: Secondary | ICD-10-CM | POA: Diagnosis not present

## 2016-02-02 DIAGNOSIS — R52 Pain, unspecified: Secondary | ICD-10-CM | POA: Diagnosis not present

## 2016-02-02 DIAGNOSIS — E119 Type 2 diabetes mellitus without complications: Secondary | ICD-10-CM | POA: Diagnosis not present

## 2016-02-04 DIAGNOSIS — D689 Coagulation defect, unspecified: Secondary | ICD-10-CM | POA: Diagnosis not present

## 2016-02-04 DIAGNOSIS — N186 End stage renal disease: Secondary | ICD-10-CM | POA: Diagnosis not present

## 2016-02-04 DIAGNOSIS — R52 Pain, unspecified: Secondary | ICD-10-CM | POA: Diagnosis not present

## 2016-02-04 DIAGNOSIS — E1129 Type 2 diabetes mellitus with other diabetic kidney complication: Secondary | ICD-10-CM | POA: Diagnosis not present

## 2016-02-04 DIAGNOSIS — E119 Type 2 diabetes mellitus without complications: Secondary | ICD-10-CM | POA: Diagnosis not present

## 2016-02-07 DIAGNOSIS — E1129 Type 2 diabetes mellitus with other diabetic kidney complication: Secondary | ICD-10-CM | POA: Diagnosis not present

## 2016-02-07 DIAGNOSIS — N186 End stage renal disease: Secondary | ICD-10-CM | POA: Diagnosis not present

## 2016-02-07 DIAGNOSIS — E119 Type 2 diabetes mellitus without complications: Secondary | ICD-10-CM | POA: Diagnosis not present

## 2016-02-07 DIAGNOSIS — R52 Pain, unspecified: Secondary | ICD-10-CM | POA: Diagnosis not present

## 2016-02-07 DIAGNOSIS — D689 Coagulation defect, unspecified: Secondary | ICD-10-CM | POA: Diagnosis not present

## 2016-02-09 DIAGNOSIS — R52 Pain, unspecified: Secondary | ICD-10-CM | POA: Diagnosis not present

## 2016-02-09 DIAGNOSIS — N186 End stage renal disease: Secondary | ICD-10-CM | POA: Diagnosis not present

## 2016-02-09 DIAGNOSIS — E119 Type 2 diabetes mellitus without complications: Secondary | ICD-10-CM | POA: Diagnosis not present

## 2016-02-09 DIAGNOSIS — E1129 Type 2 diabetes mellitus with other diabetic kidney complication: Secondary | ICD-10-CM | POA: Diagnosis not present

## 2016-02-09 DIAGNOSIS — D689 Coagulation defect, unspecified: Secondary | ICD-10-CM | POA: Diagnosis not present

## 2016-02-11 DIAGNOSIS — E119 Type 2 diabetes mellitus without complications: Secondary | ICD-10-CM | POA: Diagnosis not present

## 2016-02-11 DIAGNOSIS — N186 End stage renal disease: Secondary | ICD-10-CM | POA: Diagnosis not present

## 2016-02-11 DIAGNOSIS — R52 Pain, unspecified: Secondary | ICD-10-CM | POA: Diagnosis not present

## 2016-02-11 DIAGNOSIS — E1129 Type 2 diabetes mellitus with other diabetic kidney complication: Secondary | ICD-10-CM | POA: Diagnosis not present

## 2016-02-11 DIAGNOSIS — D689 Coagulation defect, unspecified: Secondary | ICD-10-CM | POA: Diagnosis not present

## 2016-02-14 DIAGNOSIS — E1129 Type 2 diabetes mellitus with other diabetic kidney complication: Secondary | ICD-10-CM | POA: Diagnosis not present

## 2016-02-14 DIAGNOSIS — R52 Pain, unspecified: Secondary | ICD-10-CM | POA: Diagnosis not present

## 2016-02-14 DIAGNOSIS — E119 Type 2 diabetes mellitus without complications: Secondary | ICD-10-CM | POA: Diagnosis not present

## 2016-02-14 DIAGNOSIS — D689 Coagulation defect, unspecified: Secondary | ICD-10-CM | POA: Diagnosis not present

## 2016-02-14 DIAGNOSIS — N186 End stage renal disease: Secondary | ICD-10-CM | POA: Diagnosis not present

## 2016-02-16 DIAGNOSIS — D689 Coagulation defect, unspecified: Secondary | ICD-10-CM | POA: Diagnosis not present

## 2016-02-16 DIAGNOSIS — E1129 Type 2 diabetes mellitus with other diabetic kidney complication: Secondary | ICD-10-CM | POA: Diagnosis not present

## 2016-02-16 DIAGNOSIS — R52 Pain, unspecified: Secondary | ICD-10-CM | POA: Diagnosis not present

## 2016-02-16 DIAGNOSIS — E119 Type 2 diabetes mellitus without complications: Secondary | ICD-10-CM | POA: Diagnosis not present

## 2016-02-16 DIAGNOSIS — N186 End stage renal disease: Secondary | ICD-10-CM | POA: Diagnosis not present

## 2016-02-18 DIAGNOSIS — E1129 Type 2 diabetes mellitus with other diabetic kidney complication: Secondary | ICD-10-CM | POA: Diagnosis not present

## 2016-02-18 DIAGNOSIS — D689 Coagulation defect, unspecified: Secondary | ICD-10-CM | POA: Diagnosis not present

## 2016-02-18 DIAGNOSIS — R52 Pain, unspecified: Secondary | ICD-10-CM | POA: Diagnosis not present

## 2016-02-18 DIAGNOSIS — N186 End stage renal disease: Secondary | ICD-10-CM | POA: Diagnosis not present

## 2016-02-18 DIAGNOSIS — Z992 Dependence on renal dialysis: Secondary | ICD-10-CM | POA: Diagnosis not present

## 2016-02-18 DIAGNOSIS — E119 Type 2 diabetes mellitus without complications: Secondary | ICD-10-CM | POA: Diagnosis not present

## 2016-02-21 DIAGNOSIS — D689 Coagulation defect, unspecified: Secondary | ICD-10-CM | POA: Diagnosis not present

## 2016-02-21 DIAGNOSIS — Z23 Encounter for immunization: Secondary | ICD-10-CM | POA: Diagnosis not present

## 2016-02-21 DIAGNOSIS — E1129 Type 2 diabetes mellitus with other diabetic kidney complication: Secondary | ICD-10-CM | POA: Diagnosis not present

## 2016-02-21 DIAGNOSIS — E119 Type 2 diabetes mellitus without complications: Secondary | ICD-10-CM | POA: Diagnosis not present

## 2016-02-21 DIAGNOSIS — N186 End stage renal disease: Secondary | ICD-10-CM | POA: Diagnosis not present

## 2016-02-23 DIAGNOSIS — E119 Type 2 diabetes mellitus without complications: Secondary | ICD-10-CM | POA: Diagnosis not present

## 2016-02-23 DIAGNOSIS — D689 Coagulation defect, unspecified: Secondary | ICD-10-CM | POA: Diagnosis not present

## 2016-02-23 DIAGNOSIS — E1129 Type 2 diabetes mellitus with other diabetic kidney complication: Secondary | ICD-10-CM | POA: Diagnosis not present

## 2016-02-23 DIAGNOSIS — N186 End stage renal disease: Secondary | ICD-10-CM | POA: Diagnosis not present

## 2016-02-23 DIAGNOSIS — Z23 Encounter for immunization: Secondary | ICD-10-CM | POA: Diagnosis not present

## 2016-02-25 DIAGNOSIS — E1129 Type 2 diabetes mellitus with other diabetic kidney complication: Secondary | ICD-10-CM | POA: Diagnosis not present

## 2016-02-25 DIAGNOSIS — E119 Type 2 diabetes mellitus without complications: Secondary | ICD-10-CM | POA: Diagnosis not present

## 2016-02-25 DIAGNOSIS — Z23 Encounter for immunization: Secondary | ICD-10-CM | POA: Diagnosis not present

## 2016-02-25 DIAGNOSIS — N186 End stage renal disease: Secondary | ICD-10-CM | POA: Diagnosis not present

## 2016-02-25 DIAGNOSIS — D689 Coagulation defect, unspecified: Secondary | ICD-10-CM | POA: Diagnosis not present

## 2016-02-28 DIAGNOSIS — N186 End stage renal disease: Secondary | ICD-10-CM | POA: Diagnosis not present

## 2016-02-28 DIAGNOSIS — E1129 Type 2 diabetes mellitus with other diabetic kidney complication: Secondary | ICD-10-CM | POA: Diagnosis not present

## 2016-02-28 DIAGNOSIS — E119 Type 2 diabetes mellitus without complications: Secondary | ICD-10-CM | POA: Diagnosis not present

## 2016-02-28 DIAGNOSIS — Z23 Encounter for immunization: Secondary | ICD-10-CM | POA: Diagnosis not present

## 2016-02-28 DIAGNOSIS — D689 Coagulation defect, unspecified: Secondary | ICD-10-CM | POA: Diagnosis not present

## 2016-03-01 DIAGNOSIS — D689 Coagulation defect, unspecified: Secondary | ICD-10-CM | POA: Diagnosis not present

## 2016-03-01 DIAGNOSIS — Z23 Encounter for immunization: Secondary | ICD-10-CM | POA: Diagnosis not present

## 2016-03-01 DIAGNOSIS — E119 Type 2 diabetes mellitus without complications: Secondary | ICD-10-CM | POA: Diagnosis not present

## 2016-03-01 DIAGNOSIS — E1129 Type 2 diabetes mellitus with other diabetic kidney complication: Secondary | ICD-10-CM | POA: Diagnosis not present

## 2016-03-01 DIAGNOSIS — N186 End stage renal disease: Secondary | ICD-10-CM | POA: Diagnosis not present

## 2016-03-03 DIAGNOSIS — N186 End stage renal disease: Secondary | ICD-10-CM | POA: Diagnosis not present

## 2016-03-03 DIAGNOSIS — E1129 Type 2 diabetes mellitus with other diabetic kidney complication: Secondary | ICD-10-CM | POA: Diagnosis not present

## 2016-03-03 DIAGNOSIS — Z23 Encounter for immunization: Secondary | ICD-10-CM | POA: Diagnosis not present

## 2016-03-03 DIAGNOSIS — E119 Type 2 diabetes mellitus without complications: Secondary | ICD-10-CM | POA: Diagnosis not present

## 2016-03-03 DIAGNOSIS — D689 Coagulation defect, unspecified: Secondary | ICD-10-CM | POA: Diagnosis not present

## 2016-03-06 DIAGNOSIS — N186 End stage renal disease: Secondary | ICD-10-CM | POA: Diagnosis not present

## 2016-03-06 DIAGNOSIS — E1129 Type 2 diabetes mellitus with other diabetic kidney complication: Secondary | ICD-10-CM | POA: Diagnosis not present

## 2016-03-06 DIAGNOSIS — Z23 Encounter for immunization: Secondary | ICD-10-CM | POA: Diagnosis not present

## 2016-03-06 DIAGNOSIS — D689 Coagulation defect, unspecified: Secondary | ICD-10-CM | POA: Diagnosis not present

## 2016-03-06 DIAGNOSIS — E119 Type 2 diabetes mellitus without complications: Secondary | ICD-10-CM | POA: Diagnosis not present

## 2016-03-08 DIAGNOSIS — N186 End stage renal disease: Secondary | ICD-10-CM | POA: Diagnosis not present

## 2016-03-08 DIAGNOSIS — Z23 Encounter for immunization: Secondary | ICD-10-CM | POA: Diagnosis not present

## 2016-03-08 DIAGNOSIS — D689 Coagulation defect, unspecified: Secondary | ICD-10-CM | POA: Diagnosis not present

## 2016-03-08 DIAGNOSIS — E119 Type 2 diabetes mellitus without complications: Secondary | ICD-10-CM | POA: Diagnosis not present

## 2016-03-08 DIAGNOSIS — E1129 Type 2 diabetes mellitus with other diabetic kidney complication: Secondary | ICD-10-CM | POA: Diagnosis not present

## 2016-03-10 DIAGNOSIS — E119 Type 2 diabetes mellitus without complications: Secondary | ICD-10-CM | POA: Diagnosis not present

## 2016-03-10 DIAGNOSIS — D689 Coagulation defect, unspecified: Secondary | ICD-10-CM | POA: Diagnosis not present

## 2016-03-10 DIAGNOSIS — E1129 Type 2 diabetes mellitus with other diabetic kidney complication: Secondary | ICD-10-CM | POA: Diagnosis not present

## 2016-03-10 DIAGNOSIS — Z23 Encounter for immunization: Secondary | ICD-10-CM | POA: Diagnosis not present

## 2016-03-10 DIAGNOSIS — N186 End stage renal disease: Secondary | ICD-10-CM | POA: Diagnosis not present

## 2016-03-13 DIAGNOSIS — E1129 Type 2 diabetes mellitus with other diabetic kidney complication: Secondary | ICD-10-CM | POA: Diagnosis not present

## 2016-03-13 DIAGNOSIS — E119 Type 2 diabetes mellitus without complications: Secondary | ICD-10-CM | POA: Diagnosis not present

## 2016-03-13 DIAGNOSIS — Z23 Encounter for immunization: Secondary | ICD-10-CM | POA: Diagnosis not present

## 2016-03-13 DIAGNOSIS — D689 Coagulation defect, unspecified: Secondary | ICD-10-CM | POA: Diagnosis not present

## 2016-03-13 DIAGNOSIS — N186 End stage renal disease: Secondary | ICD-10-CM | POA: Diagnosis not present

## 2016-03-15 DIAGNOSIS — E1129 Type 2 diabetes mellitus with other diabetic kidney complication: Secondary | ICD-10-CM | POA: Diagnosis not present

## 2016-03-15 DIAGNOSIS — E119 Type 2 diabetes mellitus without complications: Secondary | ICD-10-CM | POA: Diagnosis not present

## 2016-03-15 DIAGNOSIS — Z23 Encounter for immunization: Secondary | ICD-10-CM | POA: Diagnosis not present

## 2016-03-15 DIAGNOSIS — N186 End stage renal disease: Secondary | ICD-10-CM | POA: Diagnosis not present

## 2016-03-15 DIAGNOSIS — D689 Coagulation defect, unspecified: Secondary | ICD-10-CM | POA: Diagnosis not present

## 2016-03-17 DIAGNOSIS — Z23 Encounter for immunization: Secondary | ICD-10-CM | POA: Diagnosis not present

## 2016-03-17 DIAGNOSIS — N186 End stage renal disease: Secondary | ICD-10-CM | POA: Diagnosis not present

## 2016-03-17 DIAGNOSIS — E1129 Type 2 diabetes mellitus with other diabetic kidney complication: Secondary | ICD-10-CM | POA: Diagnosis not present

## 2016-03-17 DIAGNOSIS — E119 Type 2 diabetes mellitus without complications: Secondary | ICD-10-CM | POA: Diagnosis not present

## 2016-03-17 DIAGNOSIS — D689 Coagulation defect, unspecified: Secondary | ICD-10-CM | POA: Diagnosis not present

## 2016-03-20 DIAGNOSIS — Z992 Dependence on renal dialysis: Secondary | ICD-10-CM | POA: Diagnosis not present

## 2016-03-20 DIAGNOSIS — Z23 Encounter for immunization: Secondary | ICD-10-CM | POA: Diagnosis not present

## 2016-03-20 DIAGNOSIS — N186 End stage renal disease: Secondary | ICD-10-CM | POA: Diagnosis not present

## 2016-03-20 DIAGNOSIS — E1129 Type 2 diabetes mellitus with other diabetic kidney complication: Secondary | ICD-10-CM | POA: Diagnosis not present

## 2016-03-20 DIAGNOSIS — D689 Coagulation defect, unspecified: Secondary | ICD-10-CM | POA: Diagnosis not present

## 2016-03-20 DIAGNOSIS — E119 Type 2 diabetes mellitus without complications: Secondary | ICD-10-CM | POA: Diagnosis not present

## 2016-03-22 DIAGNOSIS — D509 Iron deficiency anemia, unspecified: Secondary | ICD-10-CM | POA: Diagnosis not present

## 2016-03-22 DIAGNOSIS — E119 Type 2 diabetes mellitus without complications: Secondary | ICD-10-CM | POA: Diagnosis not present

## 2016-03-22 DIAGNOSIS — D689 Coagulation defect, unspecified: Secondary | ICD-10-CM | POA: Diagnosis not present

## 2016-03-22 DIAGNOSIS — R52 Pain, unspecified: Secondary | ICD-10-CM | POA: Diagnosis not present

## 2016-03-22 DIAGNOSIS — N186 End stage renal disease: Secondary | ICD-10-CM | POA: Diagnosis not present

## 2016-03-22 DIAGNOSIS — E1129 Type 2 diabetes mellitus with other diabetic kidney complication: Secondary | ICD-10-CM | POA: Diagnosis not present

## 2016-03-24 DIAGNOSIS — D689 Coagulation defect, unspecified: Secondary | ICD-10-CM | POA: Diagnosis not present

## 2016-03-24 DIAGNOSIS — E119 Type 2 diabetes mellitus without complications: Secondary | ICD-10-CM | POA: Diagnosis not present

## 2016-03-24 DIAGNOSIS — D509 Iron deficiency anemia, unspecified: Secondary | ICD-10-CM | POA: Diagnosis not present

## 2016-03-24 DIAGNOSIS — E1129 Type 2 diabetes mellitus with other diabetic kidney complication: Secondary | ICD-10-CM | POA: Diagnosis not present

## 2016-03-24 DIAGNOSIS — R52 Pain, unspecified: Secondary | ICD-10-CM | POA: Diagnosis not present

## 2016-03-24 DIAGNOSIS — N186 End stage renal disease: Secondary | ICD-10-CM | POA: Diagnosis not present

## 2016-03-27 DIAGNOSIS — E119 Type 2 diabetes mellitus without complications: Secondary | ICD-10-CM | POA: Diagnosis not present

## 2016-03-27 DIAGNOSIS — E1129 Type 2 diabetes mellitus with other diabetic kidney complication: Secondary | ICD-10-CM | POA: Diagnosis not present

## 2016-03-27 DIAGNOSIS — R52 Pain, unspecified: Secondary | ICD-10-CM | POA: Diagnosis not present

## 2016-03-27 DIAGNOSIS — D689 Coagulation defect, unspecified: Secondary | ICD-10-CM | POA: Diagnosis not present

## 2016-03-27 DIAGNOSIS — D509 Iron deficiency anemia, unspecified: Secondary | ICD-10-CM | POA: Diagnosis not present

## 2016-03-27 DIAGNOSIS — N186 End stage renal disease: Secondary | ICD-10-CM | POA: Diagnosis not present

## 2016-03-28 DIAGNOSIS — Z5181 Encounter for therapeutic drug level monitoring: Secondary | ICD-10-CM | POA: Diagnosis not present

## 2016-03-28 DIAGNOSIS — M5441 Lumbago with sciatica, right side: Secondary | ICD-10-CM | POA: Diagnosis not present

## 2016-03-28 DIAGNOSIS — G8929 Other chronic pain: Secondary | ICD-10-CM | POA: Diagnosis not present

## 2016-03-28 DIAGNOSIS — J329 Chronic sinusitis, unspecified: Secondary | ICD-10-CM | POA: Diagnosis not present

## 2016-03-28 DIAGNOSIS — I10 Essential (primary) hypertension: Secondary | ICD-10-CM | POA: Diagnosis not present

## 2016-03-29 DIAGNOSIS — R52 Pain, unspecified: Secondary | ICD-10-CM | POA: Diagnosis not present

## 2016-03-29 DIAGNOSIS — D509 Iron deficiency anemia, unspecified: Secondary | ICD-10-CM | POA: Diagnosis not present

## 2016-03-29 DIAGNOSIS — D689 Coagulation defect, unspecified: Secondary | ICD-10-CM | POA: Diagnosis not present

## 2016-03-29 DIAGNOSIS — E1129 Type 2 diabetes mellitus with other diabetic kidney complication: Secondary | ICD-10-CM | POA: Diagnosis not present

## 2016-03-29 DIAGNOSIS — N186 End stage renal disease: Secondary | ICD-10-CM | POA: Diagnosis not present

## 2016-03-29 DIAGNOSIS — E119 Type 2 diabetes mellitus without complications: Secondary | ICD-10-CM | POA: Diagnosis not present

## 2016-03-31 DIAGNOSIS — E1129 Type 2 diabetes mellitus with other diabetic kidney complication: Secondary | ICD-10-CM | POA: Diagnosis not present

## 2016-03-31 DIAGNOSIS — E119 Type 2 diabetes mellitus without complications: Secondary | ICD-10-CM | POA: Diagnosis not present

## 2016-03-31 DIAGNOSIS — R52 Pain, unspecified: Secondary | ICD-10-CM | POA: Diagnosis not present

## 2016-03-31 DIAGNOSIS — D689 Coagulation defect, unspecified: Secondary | ICD-10-CM | POA: Diagnosis not present

## 2016-03-31 DIAGNOSIS — D509 Iron deficiency anemia, unspecified: Secondary | ICD-10-CM | POA: Diagnosis not present

## 2016-03-31 DIAGNOSIS — N186 End stage renal disease: Secondary | ICD-10-CM | POA: Diagnosis not present

## 2016-04-03 DIAGNOSIS — N186 End stage renal disease: Secondary | ICD-10-CM | POA: Diagnosis not present

## 2016-04-03 DIAGNOSIS — E1129 Type 2 diabetes mellitus with other diabetic kidney complication: Secondary | ICD-10-CM | POA: Diagnosis not present

## 2016-04-03 DIAGNOSIS — D689 Coagulation defect, unspecified: Secondary | ICD-10-CM | POA: Diagnosis not present

## 2016-04-03 DIAGNOSIS — R52 Pain, unspecified: Secondary | ICD-10-CM | POA: Diagnosis not present

## 2016-04-03 DIAGNOSIS — E119 Type 2 diabetes mellitus without complications: Secondary | ICD-10-CM | POA: Diagnosis not present

## 2016-04-03 DIAGNOSIS — D509 Iron deficiency anemia, unspecified: Secondary | ICD-10-CM | POA: Diagnosis not present

## 2016-04-05 DIAGNOSIS — R52 Pain, unspecified: Secondary | ICD-10-CM | POA: Diagnosis not present

## 2016-04-05 DIAGNOSIS — N186 End stage renal disease: Secondary | ICD-10-CM | POA: Diagnosis not present

## 2016-04-05 DIAGNOSIS — D689 Coagulation defect, unspecified: Secondary | ICD-10-CM | POA: Diagnosis not present

## 2016-04-05 DIAGNOSIS — E1129 Type 2 diabetes mellitus with other diabetic kidney complication: Secondary | ICD-10-CM | POA: Diagnosis not present

## 2016-04-05 DIAGNOSIS — D509 Iron deficiency anemia, unspecified: Secondary | ICD-10-CM | POA: Diagnosis not present

## 2016-04-05 DIAGNOSIS — E119 Type 2 diabetes mellitus without complications: Secondary | ICD-10-CM | POA: Diagnosis not present

## 2016-04-07 DIAGNOSIS — N186 End stage renal disease: Secondary | ICD-10-CM | POA: Diagnosis not present

## 2016-04-07 DIAGNOSIS — D689 Coagulation defect, unspecified: Secondary | ICD-10-CM | POA: Diagnosis not present

## 2016-04-07 DIAGNOSIS — R52 Pain, unspecified: Secondary | ICD-10-CM | POA: Diagnosis not present

## 2016-04-07 DIAGNOSIS — E1129 Type 2 diabetes mellitus with other diabetic kidney complication: Secondary | ICD-10-CM | POA: Diagnosis not present

## 2016-04-07 DIAGNOSIS — D509 Iron deficiency anemia, unspecified: Secondary | ICD-10-CM | POA: Diagnosis not present

## 2016-04-07 DIAGNOSIS — E119 Type 2 diabetes mellitus without complications: Secondary | ICD-10-CM | POA: Diagnosis not present

## 2016-04-10 DIAGNOSIS — E1129 Type 2 diabetes mellitus with other diabetic kidney complication: Secondary | ICD-10-CM | POA: Diagnosis not present

## 2016-04-10 DIAGNOSIS — D509 Iron deficiency anemia, unspecified: Secondary | ICD-10-CM | POA: Diagnosis not present

## 2016-04-10 DIAGNOSIS — D689 Coagulation defect, unspecified: Secondary | ICD-10-CM | POA: Diagnosis not present

## 2016-04-10 DIAGNOSIS — E119 Type 2 diabetes mellitus without complications: Secondary | ICD-10-CM | POA: Diagnosis not present

## 2016-04-10 DIAGNOSIS — R52 Pain, unspecified: Secondary | ICD-10-CM | POA: Diagnosis not present

## 2016-04-10 DIAGNOSIS — N186 End stage renal disease: Secondary | ICD-10-CM | POA: Diagnosis not present

## 2016-04-12 DIAGNOSIS — E1129 Type 2 diabetes mellitus with other diabetic kidney complication: Secondary | ICD-10-CM | POA: Diagnosis not present

## 2016-04-12 DIAGNOSIS — R52 Pain, unspecified: Secondary | ICD-10-CM | POA: Diagnosis not present

## 2016-04-12 DIAGNOSIS — D689 Coagulation defect, unspecified: Secondary | ICD-10-CM | POA: Diagnosis not present

## 2016-04-12 DIAGNOSIS — E119 Type 2 diabetes mellitus without complications: Secondary | ICD-10-CM | POA: Diagnosis not present

## 2016-04-12 DIAGNOSIS — N186 End stage renal disease: Secondary | ICD-10-CM | POA: Diagnosis not present

## 2016-04-12 DIAGNOSIS — D509 Iron deficiency anemia, unspecified: Secondary | ICD-10-CM | POA: Diagnosis not present

## 2016-04-14 DIAGNOSIS — E119 Type 2 diabetes mellitus without complications: Secondary | ICD-10-CM | POA: Diagnosis not present

## 2016-04-14 DIAGNOSIS — D689 Coagulation defect, unspecified: Secondary | ICD-10-CM | POA: Diagnosis not present

## 2016-04-14 DIAGNOSIS — E1129 Type 2 diabetes mellitus with other diabetic kidney complication: Secondary | ICD-10-CM | POA: Diagnosis not present

## 2016-04-14 DIAGNOSIS — D509 Iron deficiency anemia, unspecified: Secondary | ICD-10-CM | POA: Diagnosis not present

## 2016-04-14 DIAGNOSIS — N186 End stage renal disease: Secondary | ICD-10-CM | POA: Diagnosis not present

## 2016-04-14 DIAGNOSIS — R52 Pain, unspecified: Secondary | ICD-10-CM | POA: Diagnosis not present

## 2016-04-17 DIAGNOSIS — D689 Coagulation defect, unspecified: Secondary | ICD-10-CM | POA: Diagnosis not present

## 2016-04-17 DIAGNOSIS — E1129 Type 2 diabetes mellitus with other diabetic kidney complication: Secondary | ICD-10-CM | POA: Diagnosis not present

## 2016-04-17 DIAGNOSIS — D509 Iron deficiency anemia, unspecified: Secondary | ICD-10-CM | POA: Diagnosis not present

## 2016-04-17 DIAGNOSIS — N186 End stage renal disease: Secondary | ICD-10-CM | POA: Diagnosis not present

## 2016-04-17 DIAGNOSIS — E119 Type 2 diabetes mellitus without complications: Secondary | ICD-10-CM | POA: Diagnosis not present

## 2016-04-17 DIAGNOSIS — R52 Pain, unspecified: Secondary | ICD-10-CM | POA: Diagnosis not present

## 2016-04-19 DIAGNOSIS — N186 End stage renal disease: Secondary | ICD-10-CM | POA: Diagnosis not present

## 2016-04-19 DIAGNOSIS — D689 Coagulation defect, unspecified: Secondary | ICD-10-CM | POA: Diagnosis not present

## 2016-04-19 DIAGNOSIS — R52 Pain, unspecified: Secondary | ICD-10-CM | POA: Diagnosis not present

## 2016-04-19 DIAGNOSIS — Z992 Dependence on renal dialysis: Secondary | ICD-10-CM | POA: Diagnosis not present

## 2016-04-19 DIAGNOSIS — E119 Type 2 diabetes mellitus without complications: Secondary | ICD-10-CM | POA: Diagnosis not present

## 2016-04-19 DIAGNOSIS — E1129 Type 2 diabetes mellitus with other diabetic kidney complication: Secondary | ICD-10-CM | POA: Diagnosis not present

## 2016-04-19 DIAGNOSIS — D509 Iron deficiency anemia, unspecified: Secondary | ICD-10-CM | POA: Diagnosis not present

## 2016-04-21 DIAGNOSIS — N186 End stage renal disease: Secondary | ICD-10-CM | POA: Diagnosis not present

## 2016-04-21 DIAGNOSIS — D509 Iron deficiency anemia, unspecified: Secondary | ICD-10-CM | POA: Diagnosis not present

## 2016-04-21 DIAGNOSIS — D689 Coagulation defect, unspecified: Secondary | ICD-10-CM | POA: Diagnosis not present

## 2016-04-21 DIAGNOSIS — E119 Type 2 diabetes mellitus without complications: Secondary | ICD-10-CM | POA: Diagnosis not present

## 2016-04-21 DIAGNOSIS — E1129 Type 2 diabetes mellitus with other diabetic kidney complication: Secondary | ICD-10-CM | POA: Diagnosis not present

## 2016-04-21 DIAGNOSIS — R52 Pain, unspecified: Secondary | ICD-10-CM | POA: Diagnosis not present

## 2016-04-24 DIAGNOSIS — E1129 Type 2 diabetes mellitus with other diabetic kidney complication: Secondary | ICD-10-CM | POA: Diagnosis not present

## 2016-04-24 DIAGNOSIS — E119 Type 2 diabetes mellitus without complications: Secondary | ICD-10-CM | POA: Diagnosis not present

## 2016-04-24 DIAGNOSIS — D509 Iron deficiency anemia, unspecified: Secondary | ICD-10-CM | POA: Diagnosis not present

## 2016-04-24 DIAGNOSIS — D689 Coagulation defect, unspecified: Secondary | ICD-10-CM | POA: Diagnosis not present

## 2016-04-24 DIAGNOSIS — N186 End stage renal disease: Secondary | ICD-10-CM | POA: Diagnosis not present

## 2016-04-24 DIAGNOSIS — R52 Pain, unspecified: Secondary | ICD-10-CM | POA: Diagnosis not present

## 2016-04-26 DIAGNOSIS — N186 End stage renal disease: Secondary | ICD-10-CM | POA: Diagnosis not present

## 2016-04-26 DIAGNOSIS — R52 Pain, unspecified: Secondary | ICD-10-CM | POA: Diagnosis not present

## 2016-04-26 DIAGNOSIS — E119 Type 2 diabetes mellitus without complications: Secondary | ICD-10-CM | POA: Diagnosis not present

## 2016-04-26 DIAGNOSIS — E1129 Type 2 diabetes mellitus with other diabetic kidney complication: Secondary | ICD-10-CM | POA: Diagnosis not present

## 2016-04-26 DIAGNOSIS — D509 Iron deficiency anemia, unspecified: Secondary | ICD-10-CM | POA: Diagnosis not present

## 2016-04-26 DIAGNOSIS — D689 Coagulation defect, unspecified: Secondary | ICD-10-CM | POA: Diagnosis not present

## 2016-04-27 DIAGNOSIS — G8929 Other chronic pain: Secondary | ICD-10-CM | POA: Diagnosis not present

## 2016-04-27 DIAGNOSIS — Z5181 Encounter for therapeutic drug level monitoring: Secondary | ICD-10-CM | POA: Diagnosis not present

## 2016-04-27 DIAGNOSIS — M5441 Lumbago with sciatica, right side: Secondary | ICD-10-CM | POA: Diagnosis not present

## 2016-04-27 DIAGNOSIS — M5442 Lumbago with sciatica, left side: Secondary | ICD-10-CM | POA: Diagnosis not present

## 2016-04-27 DIAGNOSIS — I10 Essential (primary) hypertension: Secondary | ICD-10-CM | POA: Diagnosis not present

## 2016-04-27 DIAGNOSIS — M1990 Unspecified osteoarthritis, unspecified site: Secondary | ICD-10-CM | POA: Diagnosis not present

## 2016-04-28 DIAGNOSIS — R52 Pain, unspecified: Secondary | ICD-10-CM | POA: Diagnosis not present

## 2016-04-28 DIAGNOSIS — D509 Iron deficiency anemia, unspecified: Secondary | ICD-10-CM | POA: Diagnosis not present

## 2016-04-28 DIAGNOSIS — E1129 Type 2 diabetes mellitus with other diabetic kidney complication: Secondary | ICD-10-CM | POA: Diagnosis not present

## 2016-04-28 DIAGNOSIS — E119 Type 2 diabetes mellitus without complications: Secondary | ICD-10-CM | POA: Diagnosis not present

## 2016-04-28 DIAGNOSIS — D689 Coagulation defect, unspecified: Secondary | ICD-10-CM | POA: Diagnosis not present

## 2016-04-28 DIAGNOSIS — N186 End stage renal disease: Secondary | ICD-10-CM | POA: Diagnosis not present

## 2016-05-01 DIAGNOSIS — E1129 Type 2 diabetes mellitus with other diabetic kidney complication: Secondary | ICD-10-CM | POA: Diagnosis not present

## 2016-05-01 DIAGNOSIS — D689 Coagulation defect, unspecified: Secondary | ICD-10-CM | POA: Diagnosis not present

## 2016-05-01 DIAGNOSIS — E119 Type 2 diabetes mellitus without complications: Secondary | ICD-10-CM | POA: Diagnosis not present

## 2016-05-01 DIAGNOSIS — R52 Pain, unspecified: Secondary | ICD-10-CM | POA: Diagnosis not present

## 2016-05-01 DIAGNOSIS — N186 End stage renal disease: Secondary | ICD-10-CM | POA: Diagnosis not present

## 2016-05-01 DIAGNOSIS — D509 Iron deficiency anemia, unspecified: Secondary | ICD-10-CM | POA: Diagnosis not present

## 2016-05-03 DIAGNOSIS — E119 Type 2 diabetes mellitus without complications: Secondary | ICD-10-CM | POA: Diagnosis not present

## 2016-05-03 DIAGNOSIS — N186 End stage renal disease: Secondary | ICD-10-CM | POA: Diagnosis not present

## 2016-05-03 DIAGNOSIS — E1129 Type 2 diabetes mellitus with other diabetic kidney complication: Secondary | ICD-10-CM | POA: Diagnosis not present

## 2016-05-03 DIAGNOSIS — D509 Iron deficiency anemia, unspecified: Secondary | ICD-10-CM | POA: Diagnosis not present

## 2016-05-03 DIAGNOSIS — R52 Pain, unspecified: Secondary | ICD-10-CM | POA: Diagnosis not present

## 2016-05-03 DIAGNOSIS — D689 Coagulation defect, unspecified: Secondary | ICD-10-CM | POA: Diagnosis not present

## 2016-05-05 DIAGNOSIS — R52 Pain, unspecified: Secondary | ICD-10-CM | POA: Diagnosis not present

## 2016-05-05 DIAGNOSIS — D509 Iron deficiency anemia, unspecified: Secondary | ICD-10-CM | POA: Diagnosis not present

## 2016-05-05 DIAGNOSIS — D689 Coagulation defect, unspecified: Secondary | ICD-10-CM | POA: Diagnosis not present

## 2016-05-05 DIAGNOSIS — E1129 Type 2 diabetes mellitus with other diabetic kidney complication: Secondary | ICD-10-CM | POA: Diagnosis not present

## 2016-05-05 DIAGNOSIS — E119 Type 2 diabetes mellitus without complications: Secondary | ICD-10-CM | POA: Diagnosis not present

## 2016-05-05 DIAGNOSIS — N186 End stage renal disease: Secondary | ICD-10-CM | POA: Diagnosis not present

## 2016-05-08 DIAGNOSIS — D689 Coagulation defect, unspecified: Secondary | ICD-10-CM | POA: Diagnosis not present

## 2016-05-08 DIAGNOSIS — D509 Iron deficiency anemia, unspecified: Secondary | ICD-10-CM | POA: Diagnosis not present

## 2016-05-08 DIAGNOSIS — R52 Pain, unspecified: Secondary | ICD-10-CM | POA: Diagnosis not present

## 2016-05-08 DIAGNOSIS — N186 End stage renal disease: Secondary | ICD-10-CM | POA: Diagnosis not present

## 2016-05-08 DIAGNOSIS — E1129 Type 2 diabetes mellitus with other diabetic kidney complication: Secondary | ICD-10-CM | POA: Diagnosis not present

## 2016-05-08 DIAGNOSIS — E119 Type 2 diabetes mellitus without complications: Secondary | ICD-10-CM | POA: Diagnosis not present

## 2016-05-10 DIAGNOSIS — N186 End stage renal disease: Secondary | ICD-10-CM | POA: Diagnosis not present

## 2016-05-10 DIAGNOSIS — E1129 Type 2 diabetes mellitus with other diabetic kidney complication: Secondary | ICD-10-CM | POA: Diagnosis not present

## 2016-05-10 DIAGNOSIS — E119 Type 2 diabetes mellitus without complications: Secondary | ICD-10-CM | POA: Diagnosis not present

## 2016-05-10 DIAGNOSIS — R52 Pain, unspecified: Secondary | ICD-10-CM | POA: Diagnosis not present

## 2016-05-10 DIAGNOSIS — D509 Iron deficiency anemia, unspecified: Secondary | ICD-10-CM | POA: Diagnosis not present

## 2016-05-10 DIAGNOSIS — D689 Coagulation defect, unspecified: Secondary | ICD-10-CM | POA: Diagnosis not present

## 2016-05-12 DIAGNOSIS — D689 Coagulation defect, unspecified: Secondary | ICD-10-CM | POA: Diagnosis not present

## 2016-05-12 DIAGNOSIS — R52 Pain, unspecified: Secondary | ICD-10-CM | POA: Diagnosis not present

## 2016-05-12 DIAGNOSIS — E1129 Type 2 diabetes mellitus with other diabetic kidney complication: Secondary | ICD-10-CM | POA: Diagnosis not present

## 2016-05-12 DIAGNOSIS — N186 End stage renal disease: Secondary | ICD-10-CM | POA: Diagnosis not present

## 2016-05-12 DIAGNOSIS — D509 Iron deficiency anemia, unspecified: Secondary | ICD-10-CM | POA: Diagnosis not present

## 2016-05-12 DIAGNOSIS — E119 Type 2 diabetes mellitus without complications: Secondary | ICD-10-CM | POA: Diagnosis not present

## 2016-05-15 DIAGNOSIS — N186 End stage renal disease: Secondary | ICD-10-CM | POA: Diagnosis not present

## 2016-05-15 DIAGNOSIS — E119 Type 2 diabetes mellitus without complications: Secondary | ICD-10-CM | POA: Diagnosis not present

## 2016-05-15 DIAGNOSIS — D509 Iron deficiency anemia, unspecified: Secondary | ICD-10-CM | POA: Diagnosis not present

## 2016-05-15 DIAGNOSIS — E1129 Type 2 diabetes mellitus with other diabetic kidney complication: Secondary | ICD-10-CM | POA: Diagnosis not present

## 2016-05-15 DIAGNOSIS — R52 Pain, unspecified: Secondary | ICD-10-CM | POA: Diagnosis not present

## 2016-05-15 DIAGNOSIS — D689 Coagulation defect, unspecified: Secondary | ICD-10-CM | POA: Diagnosis not present

## 2016-05-17 DIAGNOSIS — N186 End stage renal disease: Secondary | ICD-10-CM | POA: Diagnosis not present

## 2016-05-17 DIAGNOSIS — E119 Type 2 diabetes mellitus without complications: Secondary | ICD-10-CM | POA: Diagnosis not present

## 2016-05-17 DIAGNOSIS — D509 Iron deficiency anemia, unspecified: Secondary | ICD-10-CM | POA: Diagnosis not present

## 2016-05-17 DIAGNOSIS — R52 Pain, unspecified: Secondary | ICD-10-CM | POA: Diagnosis not present

## 2016-05-17 DIAGNOSIS — D689 Coagulation defect, unspecified: Secondary | ICD-10-CM | POA: Diagnosis not present

## 2016-05-17 DIAGNOSIS — E1129 Type 2 diabetes mellitus with other diabetic kidney complication: Secondary | ICD-10-CM | POA: Diagnosis not present

## 2016-05-19 DIAGNOSIS — R52 Pain, unspecified: Secondary | ICD-10-CM | POA: Diagnosis not present

## 2016-05-19 DIAGNOSIS — E1129 Type 2 diabetes mellitus with other diabetic kidney complication: Secondary | ICD-10-CM | POA: Diagnosis not present

## 2016-05-19 DIAGNOSIS — D509 Iron deficiency anemia, unspecified: Secondary | ICD-10-CM | POA: Diagnosis not present

## 2016-05-19 DIAGNOSIS — N186 End stage renal disease: Secondary | ICD-10-CM | POA: Diagnosis not present

## 2016-05-19 DIAGNOSIS — D689 Coagulation defect, unspecified: Secondary | ICD-10-CM | POA: Diagnosis not present

## 2016-05-19 DIAGNOSIS — E119 Type 2 diabetes mellitus without complications: Secondary | ICD-10-CM | POA: Diagnosis not present

## 2016-05-20 DIAGNOSIS — N186 End stage renal disease: Secondary | ICD-10-CM | POA: Diagnosis not present

## 2016-05-20 DIAGNOSIS — E1129 Type 2 diabetes mellitus with other diabetic kidney complication: Secondary | ICD-10-CM | POA: Diagnosis not present

## 2016-05-20 DIAGNOSIS — Z992 Dependence on renal dialysis: Secondary | ICD-10-CM | POA: Diagnosis not present

## 2016-05-22 DIAGNOSIS — N186 End stage renal disease: Secondary | ICD-10-CM | POA: Diagnosis not present

## 2016-05-22 DIAGNOSIS — R52 Pain, unspecified: Secondary | ICD-10-CM | POA: Diagnosis not present

## 2016-05-22 DIAGNOSIS — D689 Coagulation defect, unspecified: Secondary | ICD-10-CM | POA: Diagnosis not present

## 2016-05-22 DIAGNOSIS — E1129 Type 2 diabetes mellitus with other diabetic kidney complication: Secondary | ICD-10-CM | POA: Diagnosis not present

## 2016-05-22 DIAGNOSIS — N2581 Secondary hyperparathyroidism of renal origin: Secondary | ICD-10-CM | POA: Diagnosis not present

## 2016-05-22 DIAGNOSIS — D509 Iron deficiency anemia, unspecified: Secondary | ICD-10-CM | POA: Diagnosis not present

## 2016-05-22 DIAGNOSIS — D631 Anemia in chronic kidney disease: Secondary | ICD-10-CM | POA: Diagnosis not present

## 2016-05-22 DIAGNOSIS — E119 Type 2 diabetes mellitus without complications: Secondary | ICD-10-CM | POA: Diagnosis not present

## 2016-05-26 DIAGNOSIS — R52 Pain, unspecified: Secondary | ICD-10-CM | POA: Diagnosis not present

## 2016-05-26 DIAGNOSIS — E119 Type 2 diabetes mellitus without complications: Secondary | ICD-10-CM | POA: Diagnosis not present

## 2016-05-26 DIAGNOSIS — N186 End stage renal disease: Secondary | ICD-10-CM | POA: Diagnosis not present

## 2016-05-26 DIAGNOSIS — D509 Iron deficiency anemia, unspecified: Secondary | ICD-10-CM | POA: Diagnosis not present

## 2016-05-26 DIAGNOSIS — E1129 Type 2 diabetes mellitus with other diabetic kidney complication: Secondary | ICD-10-CM | POA: Diagnosis not present

## 2016-05-26 DIAGNOSIS — N2581 Secondary hyperparathyroidism of renal origin: Secondary | ICD-10-CM | POA: Diagnosis not present

## 2016-05-26 DIAGNOSIS — D631 Anemia in chronic kidney disease: Secondary | ICD-10-CM | POA: Diagnosis not present

## 2016-05-26 DIAGNOSIS — D689 Coagulation defect, unspecified: Secondary | ICD-10-CM | POA: Diagnosis not present

## 2016-05-28 DIAGNOSIS — M549 Dorsalgia, unspecified: Secondary | ICD-10-CM | POA: Diagnosis not present

## 2016-05-28 DIAGNOSIS — I10 Essential (primary) hypertension: Secondary | ICD-10-CM | POA: Diagnosis not present

## 2016-05-28 DIAGNOSIS — M109 Gout, unspecified: Secondary | ICD-10-CM | POA: Diagnosis not present

## 2016-05-28 DIAGNOSIS — M170 Bilateral primary osteoarthritis of knee: Secondary | ICD-10-CM | POA: Diagnosis not present

## 2016-05-29 DIAGNOSIS — R52 Pain, unspecified: Secondary | ICD-10-CM | POA: Diagnosis not present

## 2016-05-29 DIAGNOSIS — N186 End stage renal disease: Secondary | ICD-10-CM | POA: Diagnosis not present

## 2016-05-29 DIAGNOSIS — D509 Iron deficiency anemia, unspecified: Secondary | ICD-10-CM | POA: Diagnosis not present

## 2016-05-29 DIAGNOSIS — E1129 Type 2 diabetes mellitus with other diabetic kidney complication: Secondary | ICD-10-CM | POA: Diagnosis not present

## 2016-05-29 DIAGNOSIS — E119 Type 2 diabetes mellitus without complications: Secondary | ICD-10-CM | POA: Diagnosis not present

## 2016-05-29 DIAGNOSIS — D631 Anemia in chronic kidney disease: Secondary | ICD-10-CM | POA: Diagnosis not present

## 2016-05-29 DIAGNOSIS — D689 Coagulation defect, unspecified: Secondary | ICD-10-CM | POA: Diagnosis not present

## 2016-05-29 DIAGNOSIS — N2581 Secondary hyperparathyroidism of renal origin: Secondary | ICD-10-CM | POA: Diagnosis not present

## 2016-05-31 DIAGNOSIS — D689 Coagulation defect, unspecified: Secondary | ICD-10-CM | POA: Diagnosis not present

## 2016-05-31 DIAGNOSIS — E119 Type 2 diabetes mellitus without complications: Secondary | ICD-10-CM | POA: Diagnosis not present

## 2016-05-31 DIAGNOSIS — E1129 Type 2 diabetes mellitus with other diabetic kidney complication: Secondary | ICD-10-CM | POA: Diagnosis not present

## 2016-05-31 DIAGNOSIS — R52 Pain, unspecified: Secondary | ICD-10-CM | POA: Diagnosis not present

## 2016-05-31 DIAGNOSIS — N186 End stage renal disease: Secondary | ICD-10-CM | POA: Diagnosis not present

## 2016-05-31 DIAGNOSIS — D509 Iron deficiency anemia, unspecified: Secondary | ICD-10-CM | POA: Diagnosis not present

## 2016-05-31 DIAGNOSIS — N2581 Secondary hyperparathyroidism of renal origin: Secondary | ICD-10-CM | POA: Diagnosis not present

## 2016-05-31 DIAGNOSIS — D631 Anemia in chronic kidney disease: Secondary | ICD-10-CM | POA: Diagnosis not present

## 2016-06-02 DIAGNOSIS — N186 End stage renal disease: Secondary | ICD-10-CM | POA: Diagnosis not present

## 2016-06-02 DIAGNOSIS — D509 Iron deficiency anemia, unspecified: Secondary | ICD-10-CM | POA: Diagnosis not present

## 2016-06-02 DIAGNOSIS — R52 Pain, unspecified: Secondary | ICD-10-CM | POA: Diagnosis not present

## 2016-06-02 DIAGNOSIS — D631 Anemia in chronic kidney disease: Secondary | ICD-10-CM | POA: Diagnosis not present

## 2016-06-02 DIAGNOSIS — E1129 Type 2 diabetes mellitus with other diabetic kidney complication: Secondary | ICD-10-CM | POA: Diagnosis not present

## 2016-06-02 DIAGNOSIS — N2581 Secondary hyperparathyroidism of renal origin: Secondary | ICD-10-CM | POA: Diagnosis not present

## 2016-06-02 DIAGNOSIS — E119 Type 2 diabetes mellitus without complications: Secondary | ICD-10-CM | POA: Diagnosis not present

## 2016-06-02 DIAGNOSIS — D689 Coagulation defect, unspecified: Secondary | ICD-10-CM | POA: Diagnosis not present

## 2016-06-05 DIAGNOSIS — N186 End stage renal disease: Secondary | ICD-10-CM | POA: Diagnosis not present

## 2016-06-05 DIAGNOSIS — E1129 Type 2 diabetes mellitus with other diabetic kidney complication: Secondary | ICD-10-CM | POA: Diagnosis not present

## 2016-06-05 DIAGNOSIS — N2581 Secondary hyperparathyroidism of renal origin: Secondary | ICD-10-CM | POA: Diagnosis not present

## 2016-06-05 DIAGNOSIS — D631 Anemia in chronic kidney disease: Secondary | ICD-10-CM | POA: Diagnosis not present

## 2016-06-05 DIAGNOSIS — E119 Type 2 diabetes mellitus without complications: Secondary | ICD-10-CM | POA: Diagnosis not present

## 2016-06-05 DIAGNOSIS — D509 Iron deficiency anemia, unspecified: Secondary | ICD-10-CM | POA: Diagnosis not present

## 2016-06-05 DIAGNOSIS — R52 Pain, unspecified: Secondary | ICD-10-CM | POA: Diagnosis not present

## 2016-06-05 DIAGNOSIS — D689 Coagulation defect, unspecified: Secondary | ICD-10-CM | POA: Diagnosis not present

## 2016-06-07 DIAGNOSIS — D631 Anemia in chronic kidney disease: Secondary | ICD-10-CM | POA: Diagnosis not present

## 2016-06-07 DIAGNOSIS — D509 Iron deficiency anemia, unspecified: Secondary | ICD-10-CM | POA: Diagnosis not present

## 2016-06-07 DIAGNOSIS — E1129 Type 2 diabetes mellitus with other diabetic kidney complication: Secondary | ICD-10-CM | POA: Diagnosis not present

## 2016-06-07 DIAGNOSIS — N186 End stage renal disease: Secondary | ICD-10-CM | POA: Diagnosis not present

## 2016-06-07 DIAGNOSIS — N2581 Secondary hyperparathyroidism of renal origin: Secondary | ICD-10-CM | POA: Diagnosis not present

## 2016-06-07 DIAGNOSIS — E119 Type 2 diabetes mellitus without complications: Secondary | ICD-10-CM | POA: Diagnosis not present

## 2016-06-07 DIAGNOSIS — D689 Coagulation defect, unspecified: Secondary | ICD-10-CM | POA: Diagnosis not present

## 2016-06-07 DIAGNOSIS — R52 Pain, unspecified: Secondary | ICD-10-CM | POA: Diagnosis not present

## 2016-06-09 DIAGNOSIS — N2581 Secondary hyperparathyroidism of renal origin: Secondary | ICD-10-CM | POA: Diagnosis not present

## 2016-06-09 DIAGNOSIS — D509 Iron deficiency anemia, unspecified: Secondary | ICD-10-CM | POA: Diagnosis not present

## 2016-06-09 DIAGNOSIS — E119 Type 2 diabetes mellitus without complications: Secondary | ICD-10-CM | POA: Diagnosis not present

## 2016-06-09 DIAGNOSIS — D631 Anemia in chronic kidney disease: Secondary | ICD-10-CM | POA: Diagnosis not present

## 2016-06-09 DIAGNOSIS — R52 Pain, unspecified: Secondary | ICD-10-CM | POA: Diagnosis not present

## 2016-06-09 DIAGNOSIS — N186 End stage renal disease: Secondary | ICD-10-CM | POA: Diagnosis not present

## 2016-06-09 DIAGNOSIS — D689 Coagulation defect, unspecified: Secondary | ICD-10-CM | POA: Diagnosis not present

## 2016-06-09 DIAGNOSIS — E1129 Type 2 diabetes mellitus with other diabetic kidney complication: Secondary | ICD-10-CM | POA: Diagnosis not present

## 2016-06-12 DIAGNOSIS — E119 Type 2 diabetes mellitus without complications: Secondary | ICD-10-CM | POA: Diagnosis not present

## 2016-06-12 DIAGNOSIS — N186 End stage renal disease: Secondary | ICD-10-CM | POA: Diagnosis not present

## 2016-06-12 DIAGNOSIS — R52 Pain, unspecified: Secondary | ICD-10-CM | POA: Diagnosis not present

## 2016-06-12 DIAGNOSIS — N2581 Secondary hyperparathyroidism of renal origin: Secondary | ICD-10-CM | POA: Diagnosis not present

## 2016-06-12 DIAGNOSIS — D631 Anemia in chronic kidney disease: Secondary | ICD-10-CM | POA: Diagnosis not present

## 2016-06-12 DIAGNOSIS — D689 Coagulation defect, unspecified: Secondary | ICD-10-CM | POA: Diagnosis not present

## 2016-06-12 DIAGNOSIS — E1129 Type 2 diabetes mellitus with other diabetic kidney complication: Secondary | ICD-10-CM | POA: Diagnosis not present

## 2016-06-12 DIAGNOSIS — D509 Iron deficiency anemia, unspecified: Secondary | ICD-10-CM | POA: Diagnosis not present

## 2016-06-14 DIAGNOSIS — R52 Pain, unspecified: Secondary | ICD-10-CM | POA: Diagnosis not present

## 2016-06-14 DIAGNOSIS — E1129 Type 2 diabetes mellitus with other diabetic kidney complication: Secondary | ICD-10-CM | POA: Diagnosis not present

## 2016-06-14 DIAGNOSIS — D631 Anemia in chronic kidney disease: Secondary | ICD-10-CM | POA: Diagnosis not present

## 2016-06-14 DIAGNOSIS — N186 End stage renal disease: Secondary | ICD-10-CM | POA: Diagnosis not present

## 2016-06-14 DIAGNOSIS — E119 Type 2 diabetes mellitus without complications: Secondary | ICD-10-CM | POA: Diagnosis not present

## 2016-06-14 DIAGNOSIS — D689 Coagulation defect, unspecified: Secondary | ICD-10-CM | POA: Diagnosis not present

## 2016-06-14 DIAGNOSIS — N2581 Secondary hyperparathyroidism of renal origin: Secondary | ICD-10-CM | POA: Diagnosis not present

## 2016-06-14 DIAGNOSIS — D509 Iron deficiency anemia, unspecified: Secondary | ICD-10-CM | POA: Diagnosis not present

## 2016-06-16 DIAGNOSIS — D689 Coagulation defect, unspecified: Secondary | ICD-10-CM | POA: Diagnosis not present

## 2016-06-16 DIAGNOSIS — R52 Pain, unspecified: Secondary | ICD-10-CM | POA: Diagnosis not present

## 2016-06-16 DIAGNOSIS — E119 Type 2 diabetes mellitus without complications: Secondary | ICD-10-CM | POA: Diagnosis not present

## 2016-06-16 DIAGNOSIS — E1129 Type 2 diabetes mellitus with other diabetic kidney complication: Secondary | ICD-10-CM | POA: Diagnosis not present

## 2016-06-16 DIAGNOSIS — N186 End stage renal disease: Secondary | ICD-10-CM | POA: Diagnosis not present

## 2016-06-16 DIAGNOSIS — D631 Anemia in chronic kidney disease: Secondary | ICD-10-CM | POA: Diagnosis not present

## 2016-06-16 DIAGNOSIS — N2581 Secondary hyperparathyroidism of renal origin: Secondary | ICD-10-CM | POA: Diagnosis not present

## 2016-06-16 DIAGNOSIS — D509 Iron deficiency anemia, unspecified: Secondary | ICD-10-CM | POA: Diagnosis not present

## 2016-06-19 DIAGNOSIS — R52 Pain, unspecified: Secondary | ICD-10-CM | POA: Diagnosis not present

## 2016-06-19 DIAGNOSIS — N186 End stage renal disease: Secondary | ICD-10-CM | POA: Diagnosis not present

## 2016-06-19 DIAGNOSIS — D631 Anemia in chronic kidney disease: Secondary | ICD-10-CM | POA: Diagnosis not present

## 2016-06-19 DIAGNOSIS — D509 Iron deficiency anemia, unspecified: Secondary | ICD-10-CM | POA: Diagnosis not present

## 2016-06-19 DIAGNOSIS — N2581 Secondary hyperparathyroidism of renal origin: Secondary | ICD-10-CM | POA: Diagnosis not present

## 2016-06-19 DIAGNOSIS — E1129 Type 2 diabetes mellitus with other diabetic kidney complication: Secondary | ICD-10-CM | POA: Diagnosis not present

## 2016-06-19 DIAGNOSIS — D689 Coagulation defect, unspecified: Secondary | ICD-10-CM | POA: Diagnosis not present

## 2016-06-19 DIAGNOSIS — E119 Type 2 diabetes mellitus without complications: Secondary | ICD-10-CM | POA: Diagnosis not present

## 2016-06-20 DIAGNOSIS — E1129 Type 2 diabetes mellitus with other diabetic kidney complication: Secondary | ICD-10-CM | POA: Diagnosis not present

## 2016-06-20 DIAGNOSIS — N186 End stage renal disease: Secondary | ICD-10-CM | POA: Diagnosis not present

## 2016-06-20 DIAGNOSIS — Z992 Dependence on renal dialysis: Secondary | ICD-10-CM | POA: Diagnosis not present

## 2016-06-21 DIAGNOSIS — E119 Type 2 diabetes mellitus without complications: Secondary | ICD-10-CM | POA: Diagnosis not present

## 2016-06-21 DIAGNOSIS — D631 Anemia in chronic kidney disease: Secondary | ICD-10-CM | POA: Diagnosis not present

## 2016-06-21 DIAGNOSIS — D689 Coagulation defect, unspecified: Secondary | ICD-10-CM | POA: Diagnosis not present

## 2016-06-21 DIAGNOSIS — D509 Iron deficiency anemia, unspecified: Secondary | ICD-10-CM | POA: Diagnosis not present

## 2016-06-21 DIAGNOSIS — E1129 Type 2 diabetes mellitus with other diabetic kidney complication: Secondary | ICD-10-CM | POA: Diagnosis not present

## 2016-06-21 DIAGNOSIS — N2581 Secondary hyperparathyroidism of renal origin: Secondary | ICD-10-CM | POA: Diagnosis not present

## 2016-06-21 DIAGNOSIS — N186 End stage renal disease: Secondary | ICD-10-CM | POA: Diagnosis not present

## 2016-06-23 DIAGNOSIS — D509 Iron deficiency anemia, unspecified: Secondary | ICD-10-CM | POA: Diagnosis not present

## 2016-06-23 DIAGNOSIS — E119 Type 2 diabetes mellitus without complications: Secondary | ICD-10-CM | POA: Diagnosis not present

## 2016-06-23 DIAGNOSIS — D631 Anemia in chronic kidney disease: Secondary | ICD-10-CM | POA: Diagnosis not present

## 2016-06-23 DIAGNOSIS — D689 Coagulation defect, unspecified: Secondary | ICD-10-CM | POA: Diagnosis not present

## 2016-06-23 DIAGNOSIS — N2581 Secondary hyperparathyroidism of renal origin: Secondary | ICD-10-CM | POA: Diagnosis not present

## 2016-06-23 DIAGNOSIS — N186 End stage renal disease: Secondary | ICD-10-CM | POA: Diagnosis not present

## 2016-06-23 DIAGNOSIS — E1129 Type 2 diabetes mellitus with other diabetic kidney complication: Secondary | ICD-10-CM | POA: Diagnosis not present

## 2016-06-26 DIAGNOSIS — E119 Type 2 diabetes mellitus without complications: Secondary | ICD-10-CM | POA: Diagnosis not present

## 2016-06-26 DIAGNOSIS — N186 End stage renal disease: Secondary | ICD-10-CM | POA: Diagnosis not present

## 2016-06-26 DIAGNOSIS — D509 Iron deficiency anemia, unspecified: Secondary | ICD-10-CM | POA: Diagnosis not present

## 2016-06-26 DIAGNOSIS — E1129 Type 2 diabetes mellitus with other diabetic kidney complication: Secondary | ICD-10-CM | POA: Diagnosis not present

## 2016-06-26 DIAGNOSIS — D631 Anemia in chronic kidney disease: Secondary | ICD-10-CM | POA: Diagnosis not present

## 2016-06-26 DIAGNOSIS — D689 Coagulation defect, unspecified: Secondary | ICD-10-CM | POA: Diagnosis not present

## 2016-06-26 DIAGNOSIS — N2581 Secondary hyperparathyroidism of renal origin: Secondary | ICD-10-CM | POA: Diagnosis not present

## 2016-06-28 DIAGNOSIS — D689 Coagulation defect, unspecified: Secondary | ICD-10-CM | POA: Diagnosis not present

## 2016-06-28 DIAGNOSIS — D509 Iron deficiency anemia, unspecified: Secondary | ICD-10-CM | POA: Diagnosis not present

## 2016-06-28 DIAGNOSIS — N186 End stage renal disease: Secondary | ICD-10-CM | POA: Diagnosis not present

## 2016-06-28 DIAGNOSIS — E119 Type 2 diabetes mellitus without complications: Secondary | ICD-10-CM | POA: Diagnosis not present

## 2016-06-28 DIAGNOSIS — N2581 Secondary hyperparathyroidism of renal origin: Secondary | ICD-10-CM | POA: Diagnosis not present

## 2016-06-28 DIAGNOSIS — D631 Anemia in chronic kidney disease: Secondary | ICD-10-CM | POA: Diagnosis not present

## 2016-06-28 DIAGNOSIS — E1129 Type 2 diabetes mellitus with other diabetic kidney complication: Secondary | ICD-10-CM | POA: Diagnosis not present

## 2016-06-29 DIAGNOSIS — I10 Essential (primary) hypertension: Secondary | ICD-10-CM | POA: Diagnosis not present

## 2016-06-29 DIAGNOSIS — M170 Bilateral primary osteoarthritis of knee: Secondary | ICD-10-CM | POA: Diagnosis not present

## 2016-06-29 DIAGNOSIS — Z5181 Encounter for therapeutic drug level monitoring: Secondary | ICD-10-CM | POA: Diagnosis not present

## 2016-06-29 DIAGNOSIS — M549 Dorsalgia, unspecified: Secondary | ICD-10-CM | POA: Diagnosis not present

## 2016-06-29 DIAGNOSIS — M109 Gout, unspecified: Secondary | ICD-10-CM | POA: Diagnosis not present

## 2016-06-30 DIAGNOSIS — E119 Type 2 diabetes mellitus without complications: Secondary | ICD-10-CM | POA: Diagnosis not present

## 2016-06-30 DIAGNOSIS — N186 End stage renal disease: Secondary | ICD-10-CM | POA: Diagnosis not present

## 2016-06-30 DIAGNOSIS — E1129 Type 2 diabetes mellitus with other diabetic kidney complication: Secondary | ICD-10-CM | POA: Diagnosis not present

## 2016-06-30 DIAGNOSIS — D689 Coagulation defect, unspecified: Secondary | ICD-10-CM | POA: Diagnosis not present

## 2016-06-30 DIAGNOSIS — D509 Iron deficiency anemia, unspecified: Secondary | ICD-10-CM | POA: Diagnosis not present

## 2016-06-30 DIAGNOSIS — D631 Anemia in chronic kidney disease: Secondary | ICD-10-CM | POA: Diagnosis not present

## 2016-06-30 DIAGNOSIS — N2581 Secondary hyperparathyroidism of renal origin: Secondary | ICD-10-CM | POA: Diagnosis not present

## 2016-07-02 DIAGNOSIS — N186 End stage renal disease: Secondary | ICD-10-CM | POA: Diagnosis not present

## 2016-07-02 DIAGNOSIS — D631 Anemia in chronic kidney disease: Secondary | ICD-10-CM | POA: Diagnosis not present

## 2016-07-02 DIAGNOSIS — E119 Type 2 diabetes mellitus without complications: Secondary | ICD-10-CM | POA: Diagnosis not present

## 2016-07-02 DIAGNOSIS — N2581 Secondary hyperparathyroidism of renal origin: Secondary | ICD-10-CM | POA: Diagnosis not present

## 2016-07-02 DIAGNOSIS — E1129 Type 2 diabetes mellitus with other diabetic kidney complication: Secondary | ICD-10-CM | POA: Diagnosis not present

## 2016-07-02 DIAGNOSIS — D509 Iron deficiency anemia, unspecified: Secondary | ICD-10-CM | POA: Diagnosis not present

## 2016-07-02 DIAGNOSIS — D689 Coagulation defect, unspecified: Secondary | ICD-10-CM | POA: Diagnosis not present

## 2016-07-03 DIAGNOSIS — E119 Type 2 diabetes mellitus without complications: Secondary | ICD-10-CM | POA: Diagnosis not present

## 2016-07-03 DIAGNOSIS — D631 Anemia in chronic kidney disease: Secondary | ICD-10-CM | POA: Diagnosis not present

## 2016-07-03 DIAGNOSIS — N186 End stage renal disease: Secondary | ICD-10-CM | POA: Diagnosis not present

## 2016-07-03 DIAGNOSIS — D689 Coagulation defect, unspecified: Secondary | ICD-10-CM | POA: Diagnosis not present

## 2016-07-03 DIAGNOSIS — N2581 Secondary hyperparathyroidism of renal origin: Secondary | ICD-10-CM | POA: Diagnosis not present

## 2016-07-03 DIAGNOSIS — E1129 Type 2 diabetes mellitus with other diabetic kidney complication: Secondary | ICD-10-CM | POA: Diagnosis not present

## 2016-07-03 DIAGNOSIS — D509 Iron deficiency anemia, unspecified: Secondary | ICD-10-CM | POA: Diagnosis not present

## 2016-07-05 DIAGNOSIS — D631 Anemia in chronic kidney disease: Secondary | ICD-10-CM | POA: Diagnosis not present

## 2016-07-05 DIAGNOSIS — D689 Coagulation defect, unspecified: Secondary | ICD-10-CM | POA: Diagnosis not present

## 2016-07-05 DIAGNOSIS — N186 End stage renal disease: Secondary | ICD-10-CM | POA: Diagnosis not present

## 2016-07-05 DIAGNOSIS — D509 Iron deficiency anemia, unspecified: Secondary | ICD-10-CM | POA: Diagnosis not present

## 2016-07-05 DIAGNOSIS — N2581 Secondary hyperparathyroidism of renal origin: Secondary | ICD-10-CM | POA: Diagnosis not present

## 2016-07-05 DIAGNOSIS — E119 Type 2 diabetes mellitus without complications: Secondary | ICD-10-CM | POA: Diagnosis not present

## 2016-07-05 DIAGNOSIS — E1129 Type 2 diabetes mellitus with other diabetic kidney complication: Secondary | ICD-10-CM | POA: Diagnosis not present

## 2016-07-07 DIAGNOSIS — D689 Coagulation defect, unspecified: Secondary | ICD-10-CM | POA: Diagnosis not present

## 2016-07-07 DIAGNOSIS — D509 Iron deficiency anemia, unspecified: Secondary | ICD-10-CM | POA: Diagnosis not present

## 2016-07-07 DIAGNOSIS — N186 End stage renal disease: Secondary | ICD-10-CM | POA: Diagnosis not present

## 2016-07-07 DIAGNOSIS — D631 Anemia in chronic kidney disease: Secondary | ICD-10-CM | POA: Diagnosis not present

## 2016-07-07 DIAGNOSIS — N2581 Secondary hyperparathyroidism of renal origin: Secondary | ICD-10-CM | POA: Diagnosis not present

## 2016-07-07 DIAGNOSIS — E1129 Type 2 diabetes mellitus with other diabetic kidney complication: Secondary | ICD-10-CM | POA: Diagnosis not present

## 2016-07-07 DIAGNOSIS — E119 Type 2 diabetes mellitus without complications: Secondary | ICD-10-CM | POA: Diagnosis not present

## 2016-07-10 DIAGNOSIS — N2581 Secondary hyperparathyroidism of renal origin: Secondary | ICD-10-CM | POA: Diagnosis not present

## 2016-07-10 DIAGNOSIS — D509 Iron deficiency anemia, unspecified: Secondary | ICD-10-CM | POA: Diagnosis not present

## 2016-07-10 DIAGNOSIS — N186 End stage renal disease: Secondary | ICD-10-CM | POA: Diagnosis not present

## 2016-07-10 DIAGNOSIS — E119 Type 2 diabetes mellitus without complications: Secondary | ICD-10-CM | POA: Diagnosis not present

## 2016-07-10 DIAGNOSIS — E1129 Type 2 diabetes mellitus with other diabetic kidney complication: Secondary | ICD-10-CM | POA: Diagnosis not present

## 2016-07-10 DIAGNOSIS — D631 Anemia in chronic kidney disease: Secondary | ICD-10-CM | POA: Diagnosis not present

## 2016-07-10 DIAGNOSIS — D689 Coagulation defect, unspecified: Secondary | ICD-10-CM | POA: Diagnosis not present

## 2016-07-11 DIAGNOSIS — I10 Essential (primary) hypertension: Secondary | ICD-10-CM | POA: Diagnosis not present

## 2016-07-11 DIAGNOSIS — M7989 Other specified soft tissue disorders: Secondary | ICD-10-CM | POA: Diagnosis not present

## 2016-07-12 DIAGNOSIS — E1129 Type 2 diabetes mellitus with other diabetic kidney complication: Secondary | ICD-10-CM | POA: Diagnosis not present

## 2016-07-12 DIAGNOSIS — D631 Anemia in chronic kidney disease: Secondary | ICD-10-CM | POA: Diagnosis not present

## 2016-07-12 DIAGNOSIS — N186 End stage renal disease: Secondary | ICD-10-CM | POA: Diagnosis not present

## 2016-07-12 DIAGNOSIS — N2581 Secondary hyperparathyroidism of renal origin: Secondary | ICD-10-CM | POA: Diagnosis not present

## 2016-07-12 DIAGNOSIS — D509 Iron deficiency anemia, unspecified: Secondary | ICD-10-CM | POA: Diagnosis not present

## 2016-07-12 DIAGNOSIS — D689 Coagulation defect, unspecified: Secondary | ICD-10-CM | POA: Diagnosis not present

## 2016-07-12 DIAGNOSIS — E119 Type 2 diabetes mellitus without complications: Secondary | ICD-10-CM | POA: Diagnosis not present

## 2016-07-14 DIAGNOSIS — E119 Type 2 diabetes mellitus without complications: Secondary | ICD-10-CM | POA: Diagnosis not present

## 2016-07-14 DIAGNOSIS — D509 Iron deficiency anemia, unspecified: Secondary | ICD-10-CM | POA: Diagnosis not present

## 2016-07-14 DIAGNOSIS — N2581 Secondary hyperparathyroidism of renal origin: Secondary | ICD-10-CM | POA: Diagnosis not present

## 2016-07-14 DIAGNOSIS — D631 Anemia in chronic kidney disease: Secondary | ICD-10-CM | POA: Diagnosis not present

## 2016-07-14 DIAGNOSIS — E1129 Type 2 diabetes mellitus with other diabetic kidney complication: Secondary | ICD-10-CM | POA: Diagnosis not present

## 2016-07-14 DIAGNOSIS — D689 Coagulation defect, unspecified: Secondary | ICD-10-CM | POA: Diagnosis not present

## 2016-07-14 DIAGNOSIS — N186 End stage renal disease: Secondary | ICD-10-CM | POA: Diagnosis not present

## 2016-07-17 DIAGNOSIS — E119 Type 2 diabetes mellitus without complications: Secondary | ICD-10-CM | POA: Diagnosis not present

## 2016-07-17 DIAGNOSIS — D689 Coagulation defect, unspecified: Secondary | ICD-10-CM | POA: Diagnosis not present

## 2016-07-17 DIAGNOSIS — E1129 Type 2 diabetes mellitus with other diabetic kidney complication: Secondary | ICD-10-CM | POA: Diagnosis not present

## 2016-07-17 DIAGNOSIS — D509 Iron deficiency anemia, unspecified: Secondary | ICD-10-CM | POA: Diagnosis not present

## 2016-07-17 DIAGNOSIS — D631 Anemia in chronic kidney disease: Secondary | ICD-10-CM | POA: Diagnosis not present

## 2016-07-17 DIAGNOSIS — N186 End stage renal disease: Secondary | ICD-10-CM | POA: Diagnosis not present

## 2016-07-17 DIAGNOSIS — N2581 Secondary hyperparathyroidism of renal origin: Secondary | ICD-10-CM | POA: Diagnosis not present

## 2016-07-18 DIAGNOSIS — N186 End stage renal disease: Secondary | ICD-10-CM | POA: Diagnosis not present

## 2016-07-18 DIAGNOSIS — Z992 Dependence on renal dialysis: Secondary | ICD-10-CM | POA: Diagnosis not present

## 2016-07-18 DIAGNOSIS — E1129 Type 2 diabetes mellitus with other diabetic kidney complication: Secondary | ICD-10-CM | POA: Diagnosis not present

## 2016-07-19 DIAGNOSIS — N2581 Secondary hyperparathyroidism of renal origin: Secondary | ICD-10-CM | POA: Diagnosis not present

## 2016-07-19 DIAGNOSIS — E1129 Type 2 diabetes mellitus with other diabetic kidney complication: Secondary | ICD-10-CM | POA: Diagnosis not present

## 2016-07-19 DIAGNOSIS — N186 End stage renal disease: Secondary | ICD-10-CM | POA: Diagnosis not present

## 2016-07-19 DIAGNOSIS — D509 Iron deficiency anemia, unspecified: Secondary | ICD-10-CM | POA: Diagnosis not present

## 2016-07-19 DIAGNOSIS — D689 Coagulation defect, unspecified: Secondary | ICD-10-CM | POA: Diagnosis not present

## 2016-07-19 DIAGNOSIS — R52 Pain, unspecified: Secondary | ICD-10-CM | POA: Diagnosis not present

## 2016-07-19 DIAGNOSIS — E119 Type 2 diabetes mellitus without complications: Secondary | ICD-10-CM | POA: Diagnosis not present

## 2016-07-21 DIAGNOSIS — E119 Type 2 diabetes mellitus without complications: Secondary | ICD-10-CM | POA: Diagnosis not present

## 2016-07-21 DIAGNOSIS — R52 Pain, unspecified: Secondary | ICD-10-CM | POA: Diagnosis not present

## 2016-07-21 DIAGNOSIS — D689 Coagulation defect, unspecified: Secondary | ICD-10-CM | POA: Diagnosis not present

## 2016-07-21 DIAGNOSIS — N2581 Secondary hyperparathyroidism of renal origin: Secondary | ICD-10-CM | POA: Diagnosis not present

## 2016-07-21 DIAGNOSIS — D509 Iron deficiency anemia, unspecified: Secondary | ICD-10-CM | POA: Diagnosis not present

## 2016-07-21 DIAGNOSIS — N186 End stage renal disease: Secondary | ICD-10-CM | POA: Diagnosis not present

## 2016-07-21 DIAGNOSIS — E1129 Type 2 diabetes mellitus with other diabetic kidney complication: Secondary | ICD-10-CM | POA: Diagnosis not present

## 2016-07-24 DIAGNOSIS — R52 Pain, unspecified: Secondary | ICD-10-CM | POA: Diagnosis not present

## 2016-07-24 DIAGNOSIS — N186 End stage renal disease: Secondary | ICD-10-CM | POA: Diagnosis not present

## 2016-07-24 DIAGNOSIS — N2581 Secondary hyperparathyroidism of renal origin: Secondary | ICD-10-CM | POA: Diagnosis not present

## 2016-07-24 DIAGNOSIS — E119 Type 2 diabetes mellitus without complications: Secondary | ICD-10-CM | POA: Diagnosis not present

## 2016-07-24 DIAGNOSIS — D689 Coagulation defect, unspecified: Secondary | ICD-10-CM | POA: Diagnosis not present

## 2016-07-24 DIAGNOSIS — E1129 Type 2 diabetes mellitus with other diabetic kidney complication: Secondary | ICD-10-CM | POA: Diagnosis not present

## 2016-07-24 DIAGNOSIS — D509 Iron deficiency anemia, unspecified: Secondary | ICD-10-CM | POA: Diagnosis not present

## 2016-07-26 DIAGNOSIS — E1129 Type 2 diabetes mellitus with other diabetic kidney complication: Secondary | ICD-10-CM | POA: Diagnosis not present

## 2016-07-26 DIAGNOSIS — D689 Coagulation defect, unspecified: Secondary | ICD-10-CM | POA: Diagnosis not present

## 2016-07-26 DIAGNOSIS — E119 Type 2 diabetes mellitus without complications: Secondary | ICD-10-CM | POA: Diagnosis not present

## 2016-07-26 DIAGNOSIS — R52 Pain, unspecified: Secondary | ICD-10-CM | POA: Diagnosis not present

## 2016-07-26 DIAGNOSIS — N186 End stage renal disease: Secondary | ICD-10-CM | POA: Diagnosis not present

## 2016-07-26 DIAGNOSIS — N2581 Secondary hyperparathyroidism of renal origin: Secondary | ICD-10-CM | POA: Diagnosis not present

## 2016-07-26 DIAGNOSIS — D509 Iron deficiency anemia, unspecified: Secondary | ICD-10-CM | POA: Diagnosis not present

## 2016-07-27 DIAGNOSIS — Z5181 Encounter for therapeutic drug level monitoring: Secondary | ICD-10-CM | POA: Diagnosis not present

## 2016-07-27 DIAGNOSIS — J00 Acute nasopharyngitis [common cold]: Secondary | ICD-10-CM | POA: Diagnosis not present

## 2016-07-27 DIAGNOSIS — M545 Low back pain: Secondary | ICD-10-CM | POA: Diagnosis not present

## 2016-07-27 DIAGNOSIS — M170 Bilateral primary osteoarthritis of knee: Secondary | ICD-10-CM | POA: Diagnosis not present

## 2016-07-27 DIAGNOSIS — Z79899 Other long term (current) drug therapy: Secondary | ICD-10-CM | POA: Diagnosis not present

## 2016-07-28 DIAGNOSIS — N186 End stage renal disease: Secondary | ICD-10-CM | POA: Diagnosis not present

## 2016-07-28 DIAGNOSIS — R52 Pain, unspecified: Secondary | ICD-10-CM | POA: Diagnosis not present

## 2016-07-28 DIAGNOSIS — D509 Iron deficiency anemia, unspecified: Secondary | ICD-10-CM | POA: Diagnosis not present

## 2016-07-28 DIAGNOSIS — N2581 Secondary hyperparathyroidism of renal origin: Secondary | ICD-10-CM | POA: Diagnosis not present

## 2016-07-28 DIAGNOSIS — D689 Coagulation defect, unspecified: Secondary | ICD-10-CM | POA: Diagnosis not present

## 2016-07-28 DIAGNOSIS — E119 Type 2 diabetes mellitus without complications: Secondary | ICD-10-CM | POA: Diagnosis not present

## 2016-07-28 DIAGNOSIS — E1129 Type 2 diabetes mellitus with other diabetic kidney complication: Secondary | ICD-10-CM | POA: Diagnosis not present

## 2016-08-01 DIAGNOSIS — J40 Bronchitis, not specified as acute or chronic: Secondary | ICD-10-CM | POA: Diagnosis not present

## 2016-08-02 DIAGNOSIS — E119 Type 2 diabetes mellitus without complications: Secondary | ICD-10-CM | POA: Diagnosis not present

## 2016-08-02 DIAGNOSIS — N2581 Secondary hyperparathyroidism of renal origin: Secondary | ICD-10-CM | POA: Diagnosis not present

## 2016-08-02 DIAGNOSIS — D509 Iron deficiency anemia, unspecified: Secondary | ICD-10-CM | POA: Diagnosis not present

## 2016-08-02 DIAGNOSIS — N186 End stage renal disease: Secondary | ICD-10-CM | POA: Diagnosis not present

## 2016-08-02 DIAGNOSIS — D689 Coagulation defect, unspecified: Secondary | ICD-10-CM | POA: Diagnosis not present

## 2016-08-02 DIAGNOSIS — R52 Pain, unspecified: Secondary | ICD-10-CM | POA: Diagnosis not present

## 2016-08-02 DIAGNOSIS — E1129 Type 2 diabetes mellitus with other diabetic kidney complication: Secondary | ICD-10-CM | POA: Diagnosis not present

## 2016-08-04 DIAGNOSIS — D689 Coagulation defect, unspecified: Secondary | ICD-10-CM | POA: Diagnosis not present

## 2016-08-04 DIAGNOSIS — D509 Iron deficiency anemia, unspecified: Secondary | ICD-10-CM | POA: Diagnosis not present

## 2016-08-04 DIAGNOSIS — E1129 Type 2 diabetes mellitus with other diabetic kidney complication: Secondary | ICD-10-CM | POA: Diagnosis not present

## 2016-08-04 DIAGNOSIS — R52 Pain, unspecified: Secondary | ICD-10-CM | POA: Diagnosis not present

## 2016-08-04 DIAGNOSIS — N2581 Secondary hyperparathyroidism of renal origin: Secondary | ICD-10-CM | POA: Diagnosis not present

## 2016-08-04 DIAGNOSIS — N186 End stage renal disease: Secondary | ICD-10-CM | POA: Diagnosis not present

## 2016-08-04 DIAGNOSIS — E119 Type 2 diabetes mellitus without complications: Secondary | ICD-10-CM | POA: Diagnosis not present

## 2016-08-07 DIAGNOSIS — N186 End stage renal disease: Secondary | ICD-10-CM | POA: Diagnosis not present

## 2016-08-07 DIAGNOSIS — D689 Coagulation defect, unspecified: Secondary | ICD-10-CM | POA: Diagnosis not present

## 2016-08-07 DIAGNOSIS — N2581 Secondary hyperparathyroidism of renal origin: Secondary | ICD-10-CM | POA: Diagnosis not present

## 2016-08-07 DIAGNOSIS — R52 Pain, unspecified: Secondary | ICD-10-CM | POA: Diagnosis not present

## 2016-08-07 DIAGNOSIS — E1129 Type 2 diabetes mellitus with other diabetic kidney complication: Secondary | ICD-10-CM | POA: Diagnosis not present

## 2016-08-07 DIAGNOSIS — E119 Type 2 diabetes mellitus without complications: Secondary | ICD-10-CM | POA: Diagnosis not present

## 2016-08-07 DIAGNOSIS — D509 Iron deficiency anemia, unspecified: Secondary | ICD-10-CM | POA: Diagnosis not present

## 2016-08-09 DIAGNOSIS — D689 Coagulation defect, unspecified: Secondary | ICD-10-CM | POA: Diagnosis not present

## 2016-08-09 DIAGNOSIS — N2581 Secondary hyperparathyroidism of renal origin: Secondary | ICD-10-CM | POA: Diagnosis not present

## 2016-08-09 DIAGNOSIS — R52 Pain, unspecified: Secondary | ICD-10-CM | POA: Diagnosis not present

## 2016-08-09 DIAGNOSIS — N186 End stage renal disease: Secondary | ICD-10-CM | POA: Diagnosis not present

## 2016-08-09 DIAGNOSIS — E119 Type 2 diabetes mellitus without complications: Secondary | ICD-10-CM | POA: Diagnosis not present

## 2016-08-09 DIAGNOSIS — D509 Iron deficiency anemia, unspecified: Secondary | ICD-10-CM | POA: Diagnosis not present

## 2016-08-09 DIAGNOSIS — E1129 Type 2 diabetes mellitus with other diabetic kidney complication: Secondary | ICD-10-CM | POA: Diagnosis not present

## 2016-08-11 DIAGNOSIS — N186 End stage renal disease: Secondary | ICD-10-CM | POA: Diagnosis not present

## 2016-08-11 DIAGNOSIS — D689 Coagulation defect, unspecified: Secondary | ICD-10-CM | POA: Diagnosis not present

## 2016-08-11 DIAGNOSIS — E119 Type 2 diabetes mellitus without complications: Secondary | ICD-10-CM | POA: Diagnosis not present

## 2016-08-11 DIAGNOSIS — R52 Pain, unspecified: Secondary | ICD-10-CM | POA: Diagnosis not present

## 2016-08-11 DIAGNOSIS — E1129 Type 2 diabetes mellitus with other diabetic kidney complication: Secondary | ICD-10-CM | POA: Diagnosis not present

## 2016-08-11 DIAGNOSIS — N2581 Secondary hyperparathyroidism of renal origin: Secondary | ICD-10-CM | POA: Diagnosis not present

## 2016-08-11 DIAGNOSIS — D509 Iron deficiency anemia, unspecified: Secondary | ICD-10-CM | POA: Diagnosis not present

## 2016-08-14 DIAGNOSIS — N2581 Secondary hyperparathyroidism of renal origin: Secondary | ICD-10-CM | POA: Diagnosis not present

## 2016-08-14 DIAGNOSIS — D509 Iron deficiency anemia, unspecified: Secondary | ICD-10-CM | POA: Diagnosis not present

## 2016-08-14 DIAGNOSIS — R52 Pain, unspecified: Secondary | ICD-10-CM | POA: Diagnosis not present

## 2016-08-14 DIAGNOSIS — N186 End stage renal disease: Secondary | ICD-10-CM | POA: Diagnosis not present

## 2016-08-14 DIAGNOSIS — D689 Coagulation defect, unspecified: Secondary | ICD-10-CM | POA: Diagnosis not present

## 2016-08-14 DIAGNOSIS — E119 Type 2 diabetes mellitus without complications: Secondary | ICD-10-CM | POA: Diagnosis not present

## 2016-08-14 DIAGNOSIS — E1129 Type 2 diabetes mellitus with other diabetic kidney complication: Secondary | ICD-10-CM | POA: Diagnosis not present

## 2016-08-16 DIAGNOSIS — R52 Pain, unspecified: Secondary | ICD-10-CM | POA: Diagnosis not present

## 2016-08-16 DIAGNOSIS — D509 Iron deficiency anemia, unspecified: Secondary | ICD-10-CM | POA: Diagnosis not present

## 2016-08-16 DIAGNOSIS — E1129 Type 2 diabetes mellitus with other diabetic kidney complication: Secondary | ICD-10-CM | POA: Diagnosis not present

## 2016-08-16 DIAGNOSIS — D689 Coagulation defect, unspecified: Secondary | ICD-10-CM | POA: Diagnosis not present

## 2016-08-16 DIAGNOSIS — N186 End stage renal disease: Secondary | ICD-10-CM | POA: Diagnosis not present

## 2016-08-16 DIAGNOSIS — E119 Type 2 diabetes mellitus without complications: Secondary | ICD-10-CM | POA: Diagnosis not present

## 2016-08-16 DIAGNOSIS — N2581 Secondary hyperparathyroidism of renal origin: Secondary | ICD-10-CM | POA: Diagnosis not present

## 2016-08-18 DIAGNOSIS — D689 Coagulation defect, unspecified: Secondary | ICD-10-CM | POA: Diagnosis not present

## 2016-08-18 DIAGNOSIS — D509 Iron deficiency anemia, unspecified: Secondary | ICD-10-CM | POA: Diagnosis not present

## 2016-08-18 DIAGNOSIS — R52 Pain, unspecified: Secondary | ICD-10-CM | POA: Diagnosis not present

## 2016-08-18 DIAGNOSIS — E119 Type 2 diabetes mellitus without complications: Secondary | ICD-10-CM | POA: Diagnosis not present

## 2016-08-18 DIAGNOSIS — N186 End stage renal disease: Secondary | ICD-10-CM | POA: Diagnosis not present

## 2016-08-18 DIAGNOSIS — N2581 Secondary hyperparathyroidism of renal origin: Secondary | ICD-10-CM | POA: Diagnosis not present

## 2016-08-18 DIAGNOSIS — E1129 Type 2 diabetes mellitus with other diabetic kidney complication: Secondary | ICD-10-CM | POA: Diagnosis not present

## 2016-08-18 DIAGNOSIS — Z992 Dependence on renal dialysis: Secondary | ICD-10-CM | POA: Diagnosis not present

## 2016-08-21 DIAGNOSIS — N2581 Secondary hyperparathyroidism of renal origin: Secondary | ICD-10-CM | POA: Diagnosis not present

## 2016-08-21 DIAGNOSIS — D631 Anemia in chronic kidney disease: Secondary | ICD-10-CM | POA: Diagnosis not present

## 2016-08-21 DIAGNOSIS — D689 Coagulation defect, unspecified: Secondary | ICD-10-CM | POA: Diagnosis not present

## 2016-08-21 DIAGNOSIS — R52 Pain, unspecified: Secondary | ICD-10-CM | POA: Diagnosis not present

## 2016-08-21 DIAGNOSIS — E119 Type 2 diabetes mellitus without complications: Secondary | ICD-10-CM | POA: Diagnosis not present

## 2016-08-21 DIAGNOSIS — E1129 Type 2 diabetes mellitus with other diabetic kidney complication: Secondary | ICD-10-CM | POA: Diagnosis not present

## 2016-08-21 DIAGNOSIS — N186 End stage renal disease: Secondary | ICD-10-CM | POA: Diagnosis not present

## 2016-08-23 DIAGNOSIS — N2581 Secondary hyperparathyroidism of renal origin: Secondary | ICD-10-CM | POA: Diagnosis not present

## 2016-08-23 DIAGNOSIS — N186 End stage renal disease: Secondary | ICD-10-CM | POA: Diagnosis not present

## 2016-08-23 DIAGNOSIS — R52 Pain, unspecified: Secondary | ICD-10-CM | POA: Diagnosis not present

## 2016-08-23 DIAGNOSIS — D631 Anemia in chronic kidney disease: Secondary | ICD-10-CM | POA: Diagnosis not present

## 2016-08-23 DIAGNOSIS — E1129 Type 2 diabetes mellitus with other diabetic kidney complication: Secondary | ICD-10-CM | POA: Diagnosis not present

## 2016-08-23 DIAGNOSIS — D689 Coagulation defect, unspecified: Secondary | ICD-10-CM | POA: Diagnosis not present

## 2016-08-23 DIAGNOSIS — E119 Type 2 diabetes mellitus without complications: Secondary | ICD-10-CM | POA: Diagnosis not present

## 2016-08-25 DIAGNOSIS — R52 Pain, unspecified: Secondary | ICD-10-CM | POA: Diagnosis not present

## 2016-08-25 DIAGNOSIS — E1129 Type 2 diabetes mellitus with other diabetic kidney complication: Secondary | ICD-10-CM | POA: Diagnosis not present

## 2016-08-25 DIAGNOSIS — E119 Type 2 diabetes mellitus without complications: Secondary | ICD-10-CM | POA: Diagnosis not present

## 2016-08-25 DIAGNOSIS — D631 Anemia in chronic kidney disease: Secondary | ICD-10-CM | POA: Diagnosis not present

## 2016-08-25 DIAGNOSIS — N2581 Secondary hyperparathyroidism of renal origin: Secondary | ICD-10-CM | POA: Diagnosis not present

## 2016-08-25 DIAGNOSIS — D689 Coagulation defect, unspecified: Secondary | ICD-10-CM | POA: Diagnosis not present

## 2016-08-25 DIAGNOSIS — N186 End stage renal disease: Secondary | ICD-10-CM | POA: Diagnosis not present

## 2016-08-27 DIAGNOSIS — G894 Chronic pain syndrome: Secondary | ICD-10-CM | POA: Diagnosis not present

## 2016-08-27 DIAGNOSIS — I10 Essential (primary) hypertension: Secondary | ICD-10-CM | POA: Diagnosis not present

## 2016-08-27 DIAGNOSIS — Z5181 Encounter for therapeutic drug level monitoring: Secondary | ICD-10-CM | POA: Diagnosis not present

## 2016-08-27 DIAGNOSIS — M25561 Pain in right knee: Secondary | ICD-10-CM | POA: Diagnosis not present

## 2016-08-27 DIAGNOSIS — R05 Cough: Secondary | ICD-10-CM | POA: Diagnosis not present

## 2016-08-28 DIAGNOSIS — N186 End stage renal disease: Secondary | ICD-10-CM | POA: Diagnosis not present

## 2016-08-28 DIAGNOSIS — N2581 Secondary hyperparathyroidism of renal origin: Secondary | ICD-10-CM | POA: Diagnosis not present

## 2016-08-28 DIAGNOSIS — D631 Anemia in chronic kidney disease: Secondary | ICD-10-CM | POA: Diagnosis not present

## 2016-08-28 DIAGNOSIS — E1129 Type 2 diabetes mellitus with other diabetic kidney complication: Secondary | ICD-10-CM | POA: Diagnosis not present

## 2016-08-28 DIAGNOSIS — R52 Pain, unspecified: Secondary | ICD-10-CM | POA: Diagnosis not present

## 2016-08-28 DIAGNOSIS — D689 Coagulation defect, unspecified: Secondary | ICD-10-CM | POA: Diagnosis not present

## 2016-08-28 DIAGNOSIS — E119 Type 2 diabetes mellitus without complications: Secondary | ICD-10-CM | POA: Diagnosis not present

## 2016-08-30 DIAGNOSIS — D689 Coagulation defect, unspecified: Secondary | ICD-10-CM | POA: Diagnosis not present

## 2016-08-30 DIAGNOSIS — D631 Anemia in chronic kidney disease: Secondary | ICD-10-CM | POA: Diagnosis not present

## 2016-08-30 DIAGNOSIS — N186 End stage renal disease: Secondary | ICD-10-CM | POA: Diagnosis not present

## 2016-08-30 DIAGNOSIS — R52 Pain, unspecified: Secondary | ICD-10-CM | POA: Diagnosis not present

## 2016-08-30 DIAGNOSIS — E119 Type 2 diabetes mellitus without complications: Secondary | ICD-10-CM | POA: Diagnosis not present

## 2016-08-30 DIAGNOSIS — N2581 Secondary hyperparathyroidism of renal origin: Secondary | ICD-10-CM | POA: Diagnosis not present

## 2016-08-30 DIAGNOSIS — E1129 Type 2 diabetes mellitus with other diabetic kidney complication: Secondary | ICD-10-CM | POA: Diagnosis not present

## 2016-09-01 DIAGNOSIS — R52 Pain, unspecified: Secondary | ICD-10-CM | POA: Diagnosis not present

## 2016-09-01 DIAGNOSIS — E1129 Type 2 diabetes mellitus with other diabetic kidney complication: Secondary | ICD-10-CM | POA: Diagnosis not present

## 2016-09-01 DIAGNOSIS — N2581 Secondary hyperparathyroidism of renal origin: Secondary | ICD-10-CM | POA: Diagnosis not present

## 2016-09-01 DIAGNOSIS — D631 Anemia in chronic kidney disease: Secondary | ICD-10-CM | POA: Diagnosis not present

## 2016-09-01 DIAGNOSIS — E119 Type 2 diabetes mellitus without complications: Secondary | ICD-10-CM | POA: Diagnosis not present

## 2016-09-01 DIAGNOSIS — D689 Coagulation defect, unspecified: Secondary | ICD-10-CM | POA: Diagnosis not present

## 2016-09-01 DIAGNOSIS — N186 End stage renal disease: Secondary | ICD-10-CM | POA: Diagnosis not present

## 2016-09-04 DIAGNOSIS — E1129 Type 2 diabetes mellitus with other diabetic kidney complication: Secondary | ICD-10-CM | POA: Diagnosis not present

## 2016-09-04 DIAGNOSIS — D631 Anemia in chronic kidney disease: Secondary | ICD-10-CM | POA: Diagnosis not present

## 2016-09-04 DIAGNOSIS — N2581 Secondary hyperparathyroidism of renal origin: Secondary | ICD-10-CM | POA: Diagnosis not present

## 2016-09-04 DIAGNOSIS — R52 Pain, unspecified: Secondary | ICD-10-CM | POA: Diagnosis not present

## 2016-09-04 DIAGNOSIS — D689 Coagulation defect, unspecified: Secondary | ICD-10-CM | POA: Diagnosis not present

## 2016-09-04 DIAGNOSIS — N186 End stage renal disease: Secondary | ICD-10-CM | POA: Diagnosis not present

## 2016-09-04 DIAGNOSIS — E119 Type 2 diabetes mellitus without complications: Secondary | ICD-10-CM | POA: Diagnosis not present

## 2016-09-06 DIAGNOSIS — N186 End stage renal disease: Secondary | ICD-10-CM | POA: Diagnosis not present

## 2016-09-06 DIAGNOSIS — R52 Pain, unspecified: Secondary | ICD-10-CM | POA: Diagnosis not present

## 2016-09-06 DIAGNOSIS — N2581 Secondary hyperparathyroidism of renal origin: Secondary | ICD-10-CM | POA: Diagnosis not present

## 2016-09-06 DIAGNOSIS — E119 Type 2 diabetes mellitus without complications: Secondary | ICD-10-CM | POA: Diagnosis not present

## 2016-09-06 DIAGNOSIS — D631 Anemia in chronic kidney disease: Secondary | ICD-10-CM | POA: Diagnosis not present

## 2016-09-06 DIAGNOSIS — E1129 Type 2 diabetes mellitus with other diabetic kidney complication: Secondary | ICD-10-CM | POA: Diagnosis not present

## 2016-09-06 DIAGNOSIS — D689 Coagulation defect, unspecified: Secondary | ICD-10-CM | POA: Diagnosis not present

## 2016-09-08 DIAGNOSIS — D631 Anemia in chronic kidney disease: Secondary | ICD-10-CM | POA: Diagnosis not present

## 2016-09-08 DIAGNOSIS — D689 Coagulation defect, unspecified: Secondary | ICD-10-CM | POA: Diagnosis not present

## 2016-09-08 DIAGNOSIS — R52 Pain, unspecified: Secondary | ICD-10-CM | POA: Diagnosis not present

## 2016-09-08 DIAGNOSIS — N2581 Secondary hyperparathyroidism of renal origin: Secondary | ICD-10-CM | POA: Diagnosis not present

## 2016-09-08 DIAGNOSIS — E1129 Type 2 diabetes mellitus with other diabetic kidney complication: Secondary | ICD-10-CM | POA: Diagnosis not present

## 2016-09-08 DIAGNOSIS — N186 End stage renal disease: Secondary | ICD-10-CM | POA: Diagnosis not present

## 2016-09-08 DIAGNOSIS — E119 Type 2 diabetes mellitus without complications: Secondary | ICD-10-CM | POA: Diagnosis not present

## 2016-09-11 DIAGNOSIS — D689 Coagulation defect, unspecified: Secondary | ICD-10-CM | POA: Diagnosis not present

## 2016-09-11 DIAGNOSIS — E119 Type 2 diabetes mellitus without complications: Secondary | ICD-10-CM | POA: Diagnosis not present

## 2016-09-11 DIAGNOSIS — E1129 Type 2 diabetes mellitus with other diabetic kidney complication: Secondary | ICD-10-CM | POA: Diagnosis not present

## 2016-09-11 DIAGNOSIS — N2581 Secondary hyperparathyroidism of renal origin: Secondary | ICD-10-CM | POA: Diagnosis not present

## 2016-09-11 DIAGNOSIS — D631 Anemia in chronic kidney disease: Secondary | ICD-10-CM | POA: Diagnosis not present

## 2016-09-11 DIAGNOSIS — N186 End stage renal disease: Secondary | ICD-10-CM | POA: Diagnosis not present

## 2016-09-11 DIAGNOSIS — R52 Pain, unspecified: Secondary | ICD-10-CM | POA: Diagnosis not present

## 2016-09-13 DIAGNOSIS — E1129 Type 2 diabetes mellitus with other diabetic kidney complication: Secondary | ICD-10-CM | POA: Diagnosis not present

## 2016-09-13 DIAGNOSIS — R52 Pain, unspecified: Secondary | ICD-10-CM | POA: Diagnosis not present

## 2016-09-13 DIAGNOSIS — N186 End stage renal disease: Secondary | ICD-10-CM | POA: Diagnosis not present

## 2016-09-13 DIAGNOSIS — E119 Type 2 diabetes mellitus without complications: Secondary | ICD-10-CM | POA: Diagnosis not present

## 2016-09-13 DIAGNOSIS — N2581 Secondary hyperparathyroidism of renal origin: Secondary | ICD-10-CM | POA: Diagnosis not present

## 2016-09-13 DIAGNOSIS — D689 Coagulation defect, unspecified: Secondary | ICD-10-CM | POA: Diagnosis not present

## 2016-09-13 DIAGNOSIS — D631 Anemia in chronic kidney disease: Secondary | ICD-10-CM | POA: Diagnosis not present

## 2016-09-15 DIAGNOSIS — E119 Type 2 diabetes mellitus without complications: Secondary | ICD-10-CM | POA: Diagnosis not present

## 2016-09-15 DIAGNOSIS — N2581 Secondary hyperparathyroidism of renal origin: Secondary | ICD-10-CM | POA: Diagnosis not present

## 2016-09-15 DIAGNOSIS — E1129 Type 2 diabetes mellitus with other diabetic kidney complication: Secondary | ICD-10-CM | POA: Diagnosis not present

## 2016-09-15 DIAGNOSIS — N186 End stage renal disease: Secondary | ICD-10-CM | POA: Diagnosis not present

## 2016-09-15 DIAGNOSIS — R52 Pain, unspecified: Secondary | ICD-10-CM | POA: Diagnosis not present

## 2016-09-15 DIAGNOSIS — D631 Anemia in chronic kidney disease: Secondary | ICD-10-CM | POA: Diagnosis not present

## 2016-09-15 DIAGNOSIS — D689 Coagulation defect, unspecified: Secondary | ICD-10-CM | POA: Diagnosis not present

## 2016-09-17 DIAGNOSIS — E1129 Type 2 diabetes mellitus with other diabetic kidney complication: Secondary | ICD-10-CM | POA: Diagnosis not present

## 2016-09-17 DIAGNOSIS — N186 End stage renal disease: Secondary | ICD-10-CM | POA: Diagnosis not present

## 2016-09-17 DIAGNOSIS — Z992 Dependence on renal dialysis: Secondary | ICD-10-CM | POA: Diagnosis not present

## 2016-09-18 DIAGNOSIS — N2581 Secondary hyperparathyroidism of renal origin: Secondary | ICD-10-CM | POA: Diagnosis not present

## 2016-09-18 DIAGNOSIS — R52 Pain, unspecified: Secondary | ICD-10-CM | POA: Diagnosis not present

## 2016-09-18 DIAGNOSIS — E1129 Type 2 diabetes mellitus with other diabetic kidney complication: Secondary | ICD-10-CM | POA: Diagnosis not present

## 2016-09-18 DIAGNOSIS — N186 End stage renal disease: Secondary | ICD-10-CM | POA: Diagnosis not present

## 2016-09-18 DIAGNOSIS — D689 Coagulation defect, unspecified: Secondary | ICD-10-CM | POA: Diagnosis not present

## 2016-09-18 DIAGNOSIS — E119 Type 2 diabetes mellitus without complications: Secondary | ICD-10-CM | POA: Diagnosis not present

## 2016-09-20 DIAGNOSIS — D689 Coagulation defect, unspecified: Secondary | ICD-10-CM | POA: Diagnosis not present

## 2016-09-20 DIAGNOSIS — E119 Type 2 diabetes mellitus without complications: Secondary | ICD-10-CM | POA: Diagnosis not present

## 2016-09-20 DIAGNOSIS — N186 End stage renal disease: Secondary | ICD-10-CM | POA: Diagnosis not present

## 2016-09-20 DIAGNOSIS — R52 Pain, unspecified: Secondary | ICD-10-CM | POA: Diagnosis not present

## 2016-09-20 DIAGNOSIS — E1129 Type 2 diabetes mellitus with other diabetic kidney complication: Secondary | ICD-10-CM | POA: Diagnosis not present

## 2016-09-20 DIAGNOSIS — N2581 Secondary hyperparathyroidism of renal origin: Secondary | ICD-10-CM | POA: Diagnosis not present

## 2016-09-22 DIAGNOSIS — N186 End stage renal disease: Secondary | ICD-10-CM | POA: Diagnosis not present

## 2016-09-22 DIAGNOSIS — D689 Coagulation defect, unspecified: Secondary | ICD-10-CM | POA: Diagnosis not present

## 2016-09-22 DIAGNOSIS — E119 Type 2 diabetes mellitus without complications: Secondary | ICD-10-CM | POA: Diagnosis not present

## 2016-09-22 DIAGNOSIS — E1129 Type 2 diabetes mellitus with other diabetic kidney complication: Secondary | ICD-10-CM | POA: Diagnosis not present

## 2016-09-22 DIAGNOSIS — R52 Pain, unspecified: Secondary | ICD-10-CM | POA: Diagnosis not present

## 2016-09-22 DIAGNOSIS — N2581 Secondary hyperparathyroidism of renal origin: Secondary | ICD-10-CM | POA: Diagnosis not present

## 2016-09-25 DIAGNOSIS — E1129 Type 2 diabetes mellitus with other diabetic kidney complication: Secondary | ICD-10-CM | POA: Diagnosis not present

## 2016-09-25 DIAGNOSIS — N186 End stage renal disease: Secondary | ICD-10-CM | POA: Diagnosis not present

## 2016-09-25 DIAGNOSIS — R52 Pain, unspecified: Secondary | ICD-10-CM | POA: Diagnosis not present

## 2016-09-25 DIAGNOSIS — N2581 Secondary hyperparathyroidism of renal origin: Secondary | ICD-10-CM | POA: Diagnosis not present

## 2016-09-25 DIAGNOSIS — E119 Type 2 diabetes mellitus without complications: Secondary | ICD-10-CM | POA: Diagnosis not present

## 2016-09-25 DIAGNOSIS — D689 Coagulation defect, unspecified: Secondary | ICD-10-CM | POA: Diagnosis not present

## 2016-09-26 DIAGNOSIS — Z79899 Other long term (current) drug therapy: Secondary | ICD-10-CM | POA: Diagnosis not present

## 2016-09-26 DIAGNOSIS — Z5181 Encounter for therapeutic drug level monitoring: Secondary | ICD-10-CM | POA: Diagnosis not present

## 2016-09-26 DIAGNOSIS — R252 Cramp and spasm: Secondary | ICD-10-CM | POA: Diagnosis not present

## 2016-09-26 DIAGNOSIS — I10 Essential (primary) hypertension: Secondary | ICD-10-CM | POA: Diagnosis not present

## 2016-09-27 DIAGNOSIS — D689 Coagulation defect, unspecified: Secondary | ICD-10-CM | POA: Diagnosis not present

## 2016-09-27 DIAGNOSIS — E119 Type 2 diabetes mellitus without complications: Secondary | ICD-10-CM | POA: Diagnosis not present

## 2016-09-27 DIAGNOSIS — N2581 Secondary hyperparathyroidism of renal origin: Secondary | ICD-10-CM | POA: Diagnosis not present

## 2016-09-27 DIAGNOSIS — R52 Pain, unspecified: Secondary | ICD-10-CM | POA: Diagnosis not present

## 2016-09-27 DIAGNOSIS — E1129 Type 2 diabetes mellitus with other diabetic kidney complication: Secondary | ICD-10-CM | POA: Diagnosis not present

## 2016-09-27 DIAGNOSIS — N186 End stage renal disease: Secondary | ICD-10-CM | POA: Diagnosis not present

## 2016-10-02 DIAGNOSIS — E1129 Type 2 diabetes mellitus with other diabetic kidney complication: Secondary | ICD-10-CM | POA: Diagnosis not present

## 2016-10-02 DIAGNOSIS — R52 Pain, unspecified: Secondary | ICD-10-CM | POA: Diagnosis not present

## 2016-10-02 DIAGNOSIS — E119 Type 2 diabetes mellitus without complications: Secondary | ICD-10-CM | POA: Diagnosis not present

## 2016-10-02 DIAGNOSIS — N186 End stage renal disease: Secondary | ICD-10-CM | POA: Diagnosis not present

## 2016-10-02 DIAGNOSIS — D689 Coagulation defect, unspecified: Secondary | ICD-10-CM | POA: Diagnosis not present

## 2016-10-02 DIAGNOSIS — N2581 Secondary hyperparathyroidism of renal origin: Secondary | ICD-10-CM | POA: Diagnosis not present

## 2016-10-04 DIAGNOSIS — N2581 Secondary hyperparathyroidism of renal origin: Secondary | ICD-10-CM | POA: Diagnosis not present

## 2016-10-04 DIAGNOSIS — N186 End stage renal disease: Secondary | ICD-10-CM | POA: Diagnosis not present

## 2016-10-04 DIAGNOSIS — D689 Coagulation defect, unspecified: Secondary | ICD-10-CM | POA: Diagnosis not present

## 2016-10-04 DIAGNOSIS — E1129 Type 2 diabetes mellitus with other diabetic kidney complication: Secondary | ICD-10-CM | POA: Diagnosis not present

## 2016-10-04 DIAGNOSIS — R52 Pain, unspecified: Secondary | ICD-10-CM | POA: Diagnosis not present

## 2016-10-04 DIAGNOSIS — E119 Type 2 diabetes mellitus without complications: Secondary | ICD-10-CM | POA: Diagnosis not present

## 2016-10-06 DIAGNOSIS — N2581 Secondary hyperparathyroidism of renal origin: Secondary | ICD-10-CM | POA: Diagnosis not present

## 2016-10-06 DIAGNOSIS — N186 End stage renal disease: Secondary | ICD-10-CM | POA: Diagnosis not present

## 2016-10-06 DIAGNOSIS — D689 Coagulation defect, unspecified: Secondary | ICD-10-CM | POA: Diagnosis not present

## 2016-10-06 DIAGNOSIS — E1129 Type 2 diabetes mellitus with other diabetic kidney complication: Secondary | ICD-10-CM | POA: Diagnosis not present

## 2016-10-06 DIAGNOSIS — R52 Pain, unspecified: Secondary | ICD-10-CM | POA: Diagnosis not present

## 2016-10-06 DIAGNOSIS — E119 Type 2 diabetes mellitus without complications: Secondary | ICD-10-CM | POA: Diagnosis not present

## 2016-10-08 DIAGNOSIS — M549 Dorsalgia, unspecified: Secondary | ICD-10-CM | POA: Diagnosis not present

## 2016-10-08 DIAGNOSIS — G894 Chronic pain syndrome: Secondary | ICD-10-CM | POA: Diagnosis not present

## 2016-10-09 DIAGNOSIS — N2581 Secondary hyperparathyroidism of renal origin: Secondary | ICD-10-CM | POA: Diagnosis not present

## 2016-10-09 DIAGNOSIS — D689 Coagulation defect, unspecified: Secondary | ICD-10-CM | POA: Diagnosis not present

## 2016-10-09 DIAGNOSIS — R52 Pain, unspecified: Secondary | ICD-10-CM | POA: Diagnosis not present

## 2016-10-09 DIAGNOSIS — E119 Type 2 diabetes mellitus without complications: Secondary | ICD-10-CM | POA: Diagnosis not present

## 2016-10-09 DIAGNOSIS — E1129 Type 2 diabetes mellitus with other diabetic kidney complication: Secondary | ICD-10-CM | POA: Diagnosis not present

## 2016-10-09 DIAGNOSIS — N186 End stage renal disease: Secondary | ICD-10-CM | POA: Diagnosis not present

## 2016-10-11 DIAGNOSIS — N186 End stage renal disease: Secondary | ICD-10-CM | POA: Diagnosis not present

## 2016-10-11 DIAGNOSIS — D689 Coagulation defect, unspecified: Secondary | ICD-10-CM | POA: Diagnosis not present

## 2016-10-11 DIAGNOSIS — E1129 Type 2 diabetes mellitus with other diabetic kidney complication: Secondary | ICD-10-CM | POA: Diagnosis not present

## 2016-10-11 DIAGNOSIS — R52 Pain, unspecified: Secondary | ICD-10-CM | POA: Diagnosis not present

## 2016-10-11 DIAGNOSIS — E119 Type 2 diabetes mellitus without complications: Secondary | ICD-10-CM | POA: Diagnosis not present

## 2016-10-11 DIAGNOSIS — N2581 Secondary hyperparathyroidism of renal origin: Secondary | ICD-10-CM | POA: Diagnosis not present

## 2016-10-13 DIAGNOSIS — N2581 Secondary hyperparathyroidism of renal origin: Secondary | ICD-10-CM | POA: Diagnosis not present

## 2016-10-13 DIAGNOSIS — E119 Type 2 diabetes mellitus without complications: Secondary | ICD-10-CM | POA: Diagnosis not present

## 2016-10-13 DIAGNOSIS — R52 Pain, unspecified: Secondary | ICD-10-CM | POA: Diagnosis not present

## 2016-10-13 DIAGNOSIS — E1129 Type 2 diabetes mellitus with other diabetic kidney complication: Secondary | ICD-10-CM | POA: Diagnosis not present

## 2016-10-13 DIAGNOSIS — D689 Coagulation defect, unspecified: Secondary | ICD-10-CM | POA: Diagnosis not present

## 2016-10-13 DIAGNOSIS — N186 End stage renal disease: Secondary | ICD-10-CM | POA: Diagnosis not present

## 2016-10-16 DIAGNOSIS — E1129 Type 2 diabetes mellitus with other diabetic kidney complication: Secondary | ICD-10-CM | POA: Diagnosis not present

## 2016-10-16 DIAGNOSIS — N2581 Secondary hyperparathyroidism of renal origin: Secondary | ICD-10-CM | POA: Diagnosis not present

## 2016-10-16 DIAGNOSIS — D689 Coagulation defect, unspecified: Secondary | ICD-10-CM | POA: Diagnosis not present

## 2016-10-16 DIAGNOSIS — R52 Pain, unspecified: Secondary | ICD-10-CM | POA: Diagnosis not present

## 2016-10-16 DIAGNOSIS — E119 Type 2 diabetes mellitus without complications: Secondary | ICD-10-CM | POA: Diagnosis not present

## 2016-10-16 DIAGNOSIS — N186 End stage renal disease: Secondary | ICD-10-CM | POA: Diagnosis not present

## 2016-10-18 DIAGNOSIS — E119 Type 2 diabetes mellitus without complications: Secondary | ICD-10-CM | POA: Diagnosis not present

## 2016-10-18 DIAGNOSIS — R52 Pain, unspecified: Secondary | ICD-10-CM | POA: Diagnosis not present

## 2016-10-18 DIAGNOSIS — N2581 Secondary hyperparathyroidism of renal origin: Secondary | ICD-10-CM | POA: Diagnosis not present

## 2016-10-18 DIAGNOSIS — D689 Coagulation defect, unspecified: Secondary | ICD-10-CM | POA: Diagnosis not present

## 2016-10-18 DIAGNOSIS — Z992 Dependence on renal dialysis: Secondary | ICD-10-CM | POA: Diagnosis not present

## 2016-10-18 DIAGNOSIS — E1129 Type 2 diabetes mellitus with other diabetic kidney complication: Secondary | ICD-10-CM | POA: Diagnosis not present

## 2016-10-18 DIAGNOSIS — N186 End stage renal disease: Secondary | ICD-10-CM | POA: Diagnosis not present

## 2016-10-20 DIAGNOSIS — N2581 Secondary hyperparathyroidism of renal origin: Secondary | ICD-10-CM | POA: Diagnosis not present

## 2016-10-20 DIAGNOSIS — E1129 Type 2 diabetes mellitus with other diabetic kidney complication: Secondary | ICD-10-CM | POA: Diagnosis not present

## 2016-10-20 DIAGNOSIS — D689 Coagulation defect, unspecified: Secondary | ICD-10-CM | POA: Diagnosis not present

## 2016-10-20 DIAGNOSIS — R52 Pain, unspecified: Secondary | ICD-10-CM | POA: Diagnosis not present

## 2016-10-20 DIAGNOSIS — N186 End stage renal disease: Secondary | ICD-10-CM | POA: Diagnosis not present

## 2016-10-20 DIAGNOSIS — E119 Type 2 diabetes mellitus without complications: Secondary | ICD-10-CM | POA: Diagnosis not present

## 2016-10-22 ENCOUNTER — Encounter: Payer: Self-pay | Admitting: Vascular Surgery

## 2016-10-23 DIAGNOSIS — E1129 Type 2 diabetes mellitus with other diabetic kidney complication: Secondary | ICD-10-CM | POA: Diagnosis not present

## 2016-10-23 DIAGNOSIS — D689 Coagulation defect, unspecified: Secondary | ICD-10-CM | POA: Diagnosis not present

## 2016-10-23 DIAGNOSIS — R52 Pain, unspecified: Secondary | ICD-10-CM | POA: Diagnosis not present

## 2016-10-23 DIAGNOSIS — N2581 Secondary hyperparathyroidism of renal origin: Secondary | ICD-10-CM | POA: Diagnosis not present

## 2016-10-23 DIAGNOSIS — E119 Type 2 diabetes mellitus without complications: Secondary | ICD-10-CM | POA: Diagnosis not present

## 2016-10-23 DIAGNOSIS — N186 End stage renal disease: Secondary | ICD-10-CM | POA: Diagnosis not present

## 2016-10-24 ENCOUNTER — Encounter: Payer: Self-pay | Admitting: Vascular Surgery

## 2016-10-24 ENCOUNTER — Ambulatory Visit (INDEPENDENT_AMBULATORY_CARE_PROVIDER_SITE_OTHER): Payer: Medicare Other | Admitting: Vascular Surgery

## 2016-10-24 ENCOUNTER — Other Ambulatory Visit: Payer: Self-pay

## 2016-10-24 VITALS — BP 160/70 | HR 64 | Temp 96.6°F | Resp 30 | Ht 70.0 in | Wt 243.5 lb

## 2016-10-24 DIAGNOSIS — Z992 Dependence on renal dialysis: Secondary | ICD-10-CM | POA: Diagnosis not present

## 2016-10-24 DIAGNOSIS — N186 End stage renal disease: Secondary | ICD-10-CM

## 2016-10-24 NOTE — Progress Notes (Signed)
Patient name: [name] MRN: [id number] DOB: 10/18/1935 Sex: male  REASON FOR VISIT:    Aneurysm over AV graft. The consult is requested by Dr. Signe ColtUpton.   HPI:   Ian BlakeWarren J Rostad is a 81 y.o. male who was referred with an aneurysm over the ulnar aspect of his left forearm AV graft. This patient has had this left forearm graft for many years. It is had previous revisions. He developed an aneurysmal segment over the ulnar aspect of the graft. He tells me that they cannulate the same area often. He denies fever or chills.  He dialyzes on Tuesdays Thursdays and Saturdays.   Past Medical History:  Diagnosis Date  . Anginal pain (HCC)    before CABG  . Arthritis   . CHF (congestive heart failure) (HCC)   . Constipation   . Coronary artery disease   . Diabetes mellitus   . ESRD (end stage renal disease) (HCC)    TuesThurs Sat [place]  . Hypertension   . Myocardial infarction (HCC)   . Pneumonia    2 years ago    Family History  Problem Relation Age of Onset  . Cancer Father   . Diabetes Sister     SOCIAL HISTORY: Social History  Substance Use Topics  . Smoking status: Never Smoker  . Smokeless tobacco: Current User    Types: Snuff  . Alcohol use No    Allergies  Allergen Reactions  . Iohexol Swelling and Rash    Current Outpatient Prescriptions  Medication Sig Dispense Refill  . allopurinol (ZYLOPRIM) 100 MG tablet Take 100 mg by mouth daily.    Marland Kitchen. aspirin EC 81 MG tablet Take 81 mg by mouth daily.    . cinacalcet (SENSIPAR) 30 MG tablet Take 60 mg by mouth daily.     Marland Kitchen. ipratropium (ATROVENT) 0.06 % nasal spray Place 1 spray into both nostrils daily.    Marland Kitchen. labetalol (NORMODYNE) 200 MG tablet Take 200 mg by mouth 2 (two) times daily.     . multivitamin (RENA-VIT) TABS tablet Take 1 tablet by mouth daily.    Marland Kitchen. oxyCODONE-acetaminophen (PERCOCET/ROXICET) 5-325 MG per tablet Take 1 tablet by mouth 4 (four) times daily as needed for severe pain. (Patient not taking:  Reported on 12/08/2014) 30 tablet 0  . traMADol (ULTRAM) 50 MG tablet Take 50 mg by mouth every 8 (eight) hours as needed for moderate pain.    . vitamin B-12 (CYANOCOBALAMIN) 250 MCG tablet Take 250 mcg by mouth daily.     No current facility-administered medications for this visit.     REVIEW OF SYSTEMS:  [X]  denotes positive finding, [ ]  denotes negative finding Cardiac  Comments:  Chest pain or chest pressure:    Shortness of breath upon exertion:    Short of breath when lying flat:    Irregular heart rhythm:        Vascular    Pain in calf, thigh, or hip brought on by ambulation:    Pain in feet at night that wakes you up from your sleep:     Blood clot in your veins:    Leg swelling:         Pulmonary    Oxygen at home:    Productive cough:     Wheezing:         Neurologic    Sudden weakness in arms or legs:     Sudden numbness in arms or legs:     Sudden onset  of difficulty speaking or slurred speech:    Temporary loss of vision in one eye:     Problems with dizziness:         Gastrointestinal    Blood in stool:     Vomited blood:         Genitourinary    Burning when urinating:     Blood in urine:        Psychiatric    Major depression:         Hematologic    Bleeding problems:    Problems with blood clotting too easily:        Skin    Rashes or ulcers:        Constitutional    Fever or chills:     PHYSICAL EXAM:   Vitals:   10/24/16 0926 10/24/16 0927  BP: (!) 158/73 (!) 160/70  Pulse: 64   Resp: (!) 30   Temp: (!) 96.6 F (35.9 C)   TempSrc: Oral   SpO2: 99%   Weight: 243 lb 8 oz (110.5 kg)   Height: 5\' 10"  (1.778 m)     GENERAL: The patient is a well-nourished male, in no acute distress. The vital signs are documented above. CARDIAC: There is a regular rate and rhythm.  VASCULAR: He has a palpable left radial pulse. He has a large aneurysmal segment and the ulnar aspect of his left forearm graft. The skin is somewhat thin over the  distal aspect of this but there is no ulceration or skin compromise. PULMONARY: There is good air exchange bilaterally without wheezing or rales. SKIN: There are no ulcers or rashes noted. PSYCHIATRIC: The patient has a normal affect.  DATA:    No new data  MEDICAL ISSUES:   ANEURYSM OF LEFT FOREARM AV GRAFT: This patient has a large aneurysm along the ulnar aspect of his left forearm AV graft. This graft is somewhat degenerative and has been revised previously. For simply bypassing around the aneurysm to exclude this, I have recommended that we proceed with a fistulogram to plan his operation. I try to schedule this on Monday, June 11 but this is not possible for him. Likewise I'm not available the following Monday. For this reason we will try to schedule this on a Wednesday or Friday with one of my partners. He can then be scheduled for revision of his left forearm AV graft based on his fistulogram results. I have also instructed him to not let the dialysis centers continue to cannulate this aneurysmal area. The graft has plenty of room to cannulate elsewhere.  Waverly Ferrari [facility] [place] [phone]

## 2016-10-25 DIAGNOSIS — D689 Coagulation defect, unspecified: Secondary | ICD-10-CM | POA: Diagnosis not present

## 2016-10-25 DIAGNOSIS — E1129 Type 2 diabetes mellitus with other diabetic kidney complication: Secondary | ICD-10-CM | POA: Diagnosis not present

## 2016-10-25 DIAGNOSIS — N2581 Secondary hyperparathyroidism of renal origin: Secondary | ICD-10-CM | POA: Diagnosis not present

## 2016-10-25 DIAGNOSIS — E119 Type 2 diabetes mellitus without complications: Secondary | ICD-10-CM | POA: Diagnosis not present

## 2016-10-25 DIAGNOSIS — R52 Pain, unspecified: Secondary | ICD-10-CM | POA: Diagnosis not present

## 2016-10-25 DIAGNOSIS — N186 End stage renal disease: Secondary | ICD-10-CM | POA: Diagnosis not present

## 2016-10-27 DIAGNOSIS — R52 Pain, unspecified: Secondary | ICD-10-CM | POA: Diagnosis not present

## 2016-10-27 DIAGNOSIS — E1129 Type 2 diabetes mellitus with other diabetic kidney complication: Secondary | ICD-10-CM | POA: Diagnosis not present

## 2016-10-27 DIAGNOSIS — D689 Coagulation defect, unspecified: Secondary | ICD-10-CM | POA: Diagnosis not present

## 2016-10-27 DIAGNOSIS — E119 Type 2 diabetes mellitus without complications: Secondary | ICD-10-CM | POA: Diagnosis not present

## 2016-10-27 DIAGNOSIS — N186 End stage renal disease: Secondary | ICD-10-CM | POA: Diagnosis not present

## 2016-10-27 DIAGNOSIS — N2581 Secondary hyperparathyroidism of renal origin: Secondary | ICD-10-CM | POA: Diagnosis not present

## 2016-10-30 DIAGNOSIS — N2581 Secondary hyperparathyroidism of renal origin: Secondary | ICD-10-CM | POA: Diagnosis not present

## 2016-10-30 DIAGNOSIS — R52 Pain, unspecified: Secondary | ICD-10-CM | POA: Diagnosis not present

## 2016-10-30 DIAGNOSIS — E1129 Type 2 diabetes mellitus with other diabetic kidney complication: Secondary | ICD-10-CM | POA: Diagnosis not present

## 2016-10-30 DIAGNOSIS — E119 Type 2 diabetes mellitus without complications: Secondary | ICD-10-CM | POA: Diagnosis not present

## 2016-10-30 DIAGNOSIS — N186 End stage renal disease: Secondary | ICD-10-CM | POA: Diagnosis not present

## 2016-10-30 DIAGNOSIS — D689 Coagulation defect, unspecified: Secondary | ICD-10-CM | POA: Diagnosis not present

## 2016-10-31 ENCOUNTER — Ambulatory Visit (HOSPITAL_COMMUNITY)
Admission: RE | Admit: 2016-10-31 | Discharge: 2016-10-31 | Disposition: A | Discharge status: Home / Self Care | Payer: Medicare Other | Source: Ambulatory Visit | Attending: Vascular Surgery | Admitting: Vascular Surgery

## 2016-10-31 ENCOUNTER — Other Ambulatory Visit: Payer: Self-pay | Admitting: *Deleted

## 2016-10-31 ENCOUNTER — Encounter (HOSPITAL_COMMUNITY)
Admission: RE | Disposition: A | Discharge status: Home / Self Care | Payer: Self-pay | Source: Ambulatory Visit | Attending: Vascular Surgery

## 2016-10-31 DIAGNOSIS — Y813 Surgical instruments, materials and general- and plastic-surgery devices (including sutures) associated with adverse incidents: Secondary | ICD-10-CM | POA: Insufficient documentation

## 2016-10-31 DIAGNOSIS — Z809 Family history of malignant neoplasm, unspecified: Secondary | ICD-10-CM | POA: Insufficient documentation

## 2016-10-31 DIAGNOSIS — N186 End stage renal disease: Secondary | ICD-10-CM | POA: Diagnosis not present

## 2016-10-31 DIAGNOSIS — Z79899 Other long term (current) drug therapy: Secondary | ICD-10-CM | POA: Diagnosis not present

## 2016-10-31 DIAGNOSIS — I252 Old myocardial infarction: Secondary | ICD-10-CM | POA: Diagnosis not present

## 2016-10-31 DIAGNOSIS — Z888 Allergy status to other drugs, medicaments and biological substances status: Secondary | ICD-10-CM | POA: Insufficient documentation

## 2016-10-31 DIAGNOSIS — T82898A Other specified complication of vascular prosthetic devices, implants and grafts, initial encounter: Secondary | ICD-10-CM | POA: Insufficient documentation

## 2016-10-31 DIAGNOSIS — Z7982 Long term (current) use of aspirin: Secondary | ICD-10-CM | POA: Insufficient documentation

## 2016-10-31 DIAGNOSIS — E1122 Type 2 diabetes mellitus with diabetic chronic kidney disease: Secondary | ICD-10-CM | POA: Insufficient documentation

## 2016-10-31 DIAGNOSIS — I2510 Atherosclerotic heart disease of native coronary artery without angina pectoris: Secondary | ICD-10-CM | POA: Insufficient documentation

## 2016-10-31 DIAGNOSIS — Z833 Family history of diabetes mellitus: Secondary | ICD-10-CM | POA: Insufficient documentation

## 2016-10-31 DIAGNOSIS — I132 Hypertensive heart and chronic kidney disease with heart failure and with stage 5 chronic kidney disease, or end stage renal disease: Secondary | ICD-10-CM | POA: Diagnosis not present

## 2016-10-31 DIAGNOSIS — M1990 Unspecified osteoarthritis, unspecified site: Secondary | ICD-10-CM | POA: Diagnosis not present

## 2016-10-31 DIAGNOSIS — Z992 Dependence on renal dialysis: Secondary | ICD-10-CM | POA: Insufficient documentation

## 2016-10-31 HISTORY — PX: A/V FISTULAGRAM: CATH118298

## 2016-10-31 LAB — POCT I-STAT, CHEM 8
BUN: 50 mg/dL — ABNORMAL HIGH (ref 6–20)
CHLORIDE: 99 mmol/L — AB (ref 101–111)
Calcium, Ion: 1.15 mmol/L (ref 1.15–1.40)
Creatinine, Ser: 10.7 mg/dL — ABNORMAL HIGH (ref 0.61–1.24)
GLUCOSE: 69 mg/dL (ref 65–99)
HCT: 35 % — ABNORMAL LOW (ref 39.0–52.0)
Hemoglobin: 11.9 g/dL — ABNORMAL LOW (ref 13.0–17.0)
POTASSIUM: 5.8 mmol/L — AB (ref 3.5–5.1)
Sodium: 138 mmol/L (ref 135–145)
TCO2: 32 mmol/L (ref 0–100)

## 2016-10-31 LAB — GLUCOSE, CAPILLARY
GLUCOSE-CAPILLARY: 102 mg/dL — AB (ref 65–99)
GLUCOSE-CAPILLARY: 61 mg/dL — AB (ref 65–99)
GLUCOSE-CAPILLARY: 75 mg/dL (ref 65–99)
GLUCOSE-CAPILLARY: 97 mg/dL (ref 65–99)

## 2016-10-31 SURGERY — A/V FISTULAGRAM
Anesthesia: LOCAL | Laterality: Left

## 2016-10-31 MED ORDER — METHYLPREDNISOLONE SODIUM SUCC 125 MG IJ SOLR
125.0000 mg | INTRAMUSCULAR | Status: AC
  Administered 2016-10-31: 62.5 mg via INTRAVENOUS

## 2016-10-31 MED ORDER — SODIUM CHLORIDE 0.9% FLUSH: 3.0000 mL | INTRAVENOUS | Status: DC | PRN

## 2016-10-31 MED ORDER — METHYLPREDNISOLONE SODIUM SUCC 125 MG IJ SOLR
INTRAMUSCULAR | Status: AC
  Administered 2016-10-31: 62.5 mg via INTRAVENOUS
  Filled 2016-10-31: qty 2

## 2016-10-31 MED ORDER — DEXTROSE 50 % IV SOLN
25.0000 mL | INTRAVENOUS | Status: DC | PRN
  Administered 2016-10-31: 25 mL via INTRAVENOUS

## 2016-10-31 MED ORDER — DEXTROSE 50 % IV SOLN
INTRAVENOUS | Status: AC
  Administered 2016-10-31: 25 mL via INTRAVENOUS
  Filled 2016-10-31: qty 50

## 2016-10-31 MED ORDER — DIPHENHYDRAMINE HCL 50 MG/ML IJ SOLN
INTRAMUSCULAR | Status: AC
  Administered 2016-10-31: 25 mg via INTRAVENOUS
  Filled 2016-10-31: qty 1

## 2016-10-31 MED ORDER — HEPARIN SODIUM (PORCINE) 1000 UNIT/ML IJ SOLN
INTRAMUSCULAR | Status: DC | PRN
  Administered 2016-10-31: 5000 [IU] via INTRAVENOUS

## 2016-10-31 MED ORDER — HEPARIN (PORCINE) IN NACL 2-0.9 UNIT/ML-% IJ SOLN
INTRAMUSCULAR | Status: AC | PRN
  Administered 2016-10-31: 500 mL

## 2016-10-31 MED ORDER — DIPHENHYDRAMINE HCL 50 MG/ML IJ SOLN
25.0000 mg | INTRAMUSCULAR | Status: AC
  Administered 2016-10-31: 25 mg via INTRAVENOUS

## 2016-10-31 MED ORDER — LIDOCAINE HCL 1 % IJ SOLN
INTRAMUSCULAR | Status: AC
  Filled 2016-10-31: qty 20

## 2016-10-31 MED ORDER — FAMOTIDINE IN NACL 20-0.9 MG/50ML-% IV SOLN
INTRAVENOUS | Status: AC
  Administered 2016-10-31: 20 mg via INTRAVENOUS
  Filled 2016-10-31: qty 50

## 2016-10-31 MED ORDER — HEPARIN (PORCINE) IN NACL 2-0.9 UNIT/ML-% IJ SOLN
INTRAMUSCULAR | Status: AC
  Filled 2016-10-31: qty 500

## 2016-10-31 MED ORDER — FAMOTIDINE IN NACL 20-0.9 MG/50ML-% IV SOLN
20.0000 mg | INTRAVENOUS | Status: AC
  Administered 2016-10-31: 20 mg via INTRAVENOUS

## 2016-10-31 MED ORDER — LIDOCAINE HCL (PF) 1 % IJ SOLN
INTRAMUSCULAR | Status: DC | PRN
  Administered 2016-10-31: 3 mL

## 2016-10-31 MED ORDER — IODIXANOL 320 MG/ML IV SOLN
INTRAVENOUS | Status: DC | PRN
  Administered 2016-10-31: 50 mL via INTRAVENOUS

## 2016-10-31 MED ORDER — HEPARIN SODIUM (PORCINE) 1000 UNIT/ML IJ SOLN
INTRAMUSCULAR | Status: AC
  Filled 2016-10-31: qty 1

## 2016-10-31 SURGICAL SUPPLY — 16 items
BAG SNAP BAND KOVER 36X36 (MISCELLANEOUS) ×2 IMPLANT
BALLN MUSTANG 7X80X75 (BALLOONS) ×2
BALLOON MUSTANG 7X80X75 (BALLOONS) IMPLANT
CATH ANGIO 5F BER2 65CM (CATHETERS) ×1 IMPLANT
COVER DOME SNAP 22 D (MISCELLANEOUS) ×2 IMPLANT
COVER PRB 48X5XTLSCP FOLD TPE (BAG) ×1 IMPLANT
COVER PROBE 5X48 (BAG) ×2
KIT ENCORE 26 ADVANTAGE (KITS) ×1 IMPLANT
KIT MICROINTRODUCER STIFF 5F (SHEATH) ×1 IMPLANT
PROTECTION STATION PRESSURIZED (MISCELLANEOUS) ×2
SHEATH PINNACLE R/O II 6F 4CM (SHEATH) ×1 IMPLANT
STATION PROTECTION PRESSURIZED (MISCELLANEOUS) ×1 IMPLANT
STOPCOCK MORSE 400PSI 3WAY (MISCELLANEOUS) ×2 IMPLANT
TRAY PV CATH (CUSTOM PROCEDURE TRAY) ×2 IMPLANT
TUBING CIL FLEX 10 FLL-RA (TUBING) ×2 IMPLANT
WIRE BENTSON .035X145CM (WIRE) ×1 IMPLANT

## 2016-10-31 NOTE — H&P (Signed)
   History and Physical Update  The patient was interviewed and re-examined.  The patient's previous History and Physical has been reviewed and is unchanged from Dr. Edilia Boickson office visit. Plan for left arm fistulogram with possible intervention.   Merrik Puebla C. Randie Heinzain, MD [facility] Office: [phone] Pager: [phone]  10/31/2016, 10:37 AM

## 2016-10-31 NOTE — Discharge Instructions (Signed)

## 2016-10-31 NOTE — Op Note (Signed)
    Patient name: Ian BlakeWarren J Schoeppner MRN: [id number] DOB: 01/16/1936 Sex: male  10/31/2016 Pre-operative Diagnosis: pseudoaneurysm of left forearm av graft  Post-operative diagnosis:  Same with stenosis of runoff vein Surgeon:  Luanna SalkBrandon C. Randie Heinzain, MD Procedure Performed: 1.  shuntogram of left arm av graft 2.  Balloon angioplasty of left arm brachial vein with 7mm balloon  Indications:  81 year old male on dialysis via left arm AV graft that has had revisions and now needs revision of a pseudoaneurysm. We are performing shuntogram for roadmap and possible intervention of the runoff vein.  Findings: There is a large pseudoaneurysm on the venous limb of the graft. The runoff vein has multiple stenoses greater than 50% that were balloon dilated to less than 10% residual. There is no central venous stenosis.   Procedure:  The patient was identified in the holding area and taken to room 8.  The patient was then placed supine on the table and prepped and draped in the usual sterile fashion.  A time out was called.  Ultrasound was used to evaluate the left arm AV graft. This was cannulated towards the venous end with micropuncture needle followed by wire and sheath. We then performed a shuntogram with central runoff with the above findings. We then used a Transport plannerBenson wire to exchange for 6 Jamaica[place] sheath. Patient was given 5000 units of heparin. Bentson wire bare catheter were used to cross the stenoses. We then used 7 mm balloon to balloon dilate the multiple levels of stenoses starting centrally and working peripherally in the brachial veins. We then performed completion angiography which demonstrated improved runoff there was also improved thrill in the graft. Satisfied with this we removed the wire and balloon and placed a pursestring around the sheath and removed this. Pressure was held for a few minutes until hemostasis obtained. Patient tolerated procedure well without immediate, patient.  I discussed with Dr.  Edilia Boickson and he will plan for operative revision in the near future.  Contrast: 50cc     Glyn Gerads C. Randie Heinzain, MD [facility] [place] Office: [phone] Pager: [phone]

## 2016-11-01 ENCOUNTER — Encounter (HOSPITAL_COMMUNITY): Payer: Self-pay | Admitting: Vascular Surgery

## 2016-11-01 DIAGNOSIS — N186 End stage renal disease: Secondary | ICD-10-CM | POA: Diagnosis not present

## 2016-11-01 DIAGNOSIS — E119 Type 2 diabetes mellitus without complications: Secondary | ICD-10-CM | POA: Diagnosis not present

## 2016-11-01 DIAGNOSIS — E1129 Type 2 diabetes mellitus with other diabetic kidney complication: Secondary | ICD-10-CM | POA: Diagnosis not present

## 2016-11-01 DIAGNOSIS — N2581 Secondary hyperparathyroidism of renal origin: Secondary | ICD-10-CM | POA: Diagnosis not present

## 2016-11-01 DIAGNOSIS — D689 Coagulation defect, unspecified: Secondary | ICD-10-CM | POA: Diagnosis not present

## 2016-11-01 DIAGNOSIS — R52 Pain, unspecified: Secondary | ICD-10-CM | POA: Diagnosis not present

## 2016-11-03 DIAGNOSIS — N2581 Secondary hyperparathyroidism of renal origin: Secondary | ICD-10-CM | POA: Diagnosis not present

## 2016-11-03 DIAGNOSIS — N186 End stage renal disease: Secondary | ICD-10-CM | POA: Diagnosis not present

## 2016-11-03 DIAGNOSIS — E119 Type 2 diabetes mellitus without complications: Secondary | ICD-10-CM | POA: Diagnosis not present

## 2016-11-03 DIAGNOSIS — R52 Pain, unspecified: Secondary | ICD-10-CM | POA: Diagnosis not present

## 2016-11-03 DIAGNOSIS — E1129 Type 2 diabetes mellitus with other diabetic kidney complication: Secondary | ICD-10-CM | POA: Diagnosis not present

## 2016-11-03 DIAGNOSIS — D689 Coagulation defect, unspecified: Secondary | ICD-10-CM | POA: Diagnosis not present

## 2016-11-06 DIAGNOSIS — E1129 Type 2 diabetes mellitus with other diabetic kidney complication: Secondary | ICD-10-CM | POA: Diagnosis not present

## 2016-11-06 DIAGNOSIS — D689 Coagulation defect, unspecified: Secondary | ICD-10-CM | POA: Diagnosis not present

## 2016-11-06 DIAGNOSIS — N186 End stage renal disease: Secondary | ICD-10-CM | POA: Diagnosis not present

## 2016-11-06 DIAGNOSIS — E119 Type 2 diabetes mellitus without complications: Secondary | ICD-10-CM | POA: Diagnosis not present

## 2016-11-06 DIAGNOSIS — N2581 Secondary hyperparathyroidism of renal origin: Secondary | ICD-10-CM | POA: Diagnosis not present

## 2016-11-06 DIAGNOSIS — R52 Pain, unspecified: Secondary | ICD-10-CM | POA: Diagnosis not present

## 2016-11-07 DIAGNOSIS — M549 Dorsalgia, unspecified: Secondary | ICD-10-CM | POA: Diagnosis not present

## 2016-11-07 DIAGNOSIS — G894 Chronic pain syndrome: Secondary | ICD-10-CM | POA: Diagnosis not present

## 2016-11-07 DIAGNOSIS — R300 Dysuria: Secondary | ICD-10-CM | POA: Diagnosis not present

## 2016-11-07 DIAGNOSIS — M79671 Pain in right foot: Secondary | ICD-10-CM | POA: Diagnosis not present

## 2016-11-08 DIAGNOSIS — E1129 Type 2 diabetes mellitus with other diabetic kidney complication: Secondary | ICD-10-CM | POA: Diagnosis not present

## 2016-11-08 DIAGNOSIS — N186 End stage renal disease: Secondary | ICD-10-CM | POA: Diagnosis not present

## 2016-11-08 DIAGNOSIS — E119 Type 2 diabetes mellitus without complications: Secondary | ICD-10-CM | POA: Diagnosis not present

## 2016-11-08 DIAGNOSIS — N2581 Secondary hyperparathyroidism of renal origin: Secondary | ICD-10-CM | POA: Diagnosis not present

## 2016-11-08 DIAGNOSIS — D689 Coagulation defect, unspecified: Secondary | ICD-10-CM | POA: Diagnosis not present

## 2016-11-08 DIAGNOSIS — R52 Pain, unspecified: Secondary | ICD-10-CM | POA: Diagnosis not present

## 2016-11-10 DIAGNOSIS — E119 Type 2 diabetes mellitus without complications: Secondary | ICD-10-CM | POA: Diagnosis not present

## 2016-11-10 DIAGNOSIS — N2581 Secondary hyperparathyroidism of renal origin: Secondary | ICD-10-CM | POA: Diagnosis not present

## 2016-11-10 DIAGNOSIS — N186 End stage renal disease: Secondary | ICD-10-CM | POA: Diagnosis not present

## 2016-11-10 DIAGNOSIS — R52 Pain, unspecified: Secondary | ICD-10-CM | POA: Diagnosis not present

## 2016-11-10 DIAGNOSIS — D689 Coagulation defect, unspecified: Secondary | ICD-10-CM | POA: Diagnosis not present

## 2016-11-10 DIAGNOSIS — E1129 Type 2 diabetes mellitus with other diabetic kidney complication: Secondary | ICD-10-CM | POA: Diagnosis not present

## 2016-11-13 DIAGNOSIS — E1129 Type 2 diabetes mellitus with other diabetic kidney complication: Secondary | ICD-10-CM | POA: Diagnosis not present

## 2016-11-13 DIAGNOSIS — R52 Pain, unspecified: Secondary | ICD-10-CM | POA: Diagnosis not present

## 2016-11-13 DIAGNOSIS — D689 Coagulation defect, unspecified: Secondary | ICD-10-CM | POA: Diagnosis not present

## 2016-11-13 DIAGNOSIS — N2581 Secondary hyperparathyroidism of renal origin: Secondary | ICD-10-CM | POA: Diagnosis not present

## 2016-11-13 DIAGNOSIS — N186 End stage renal disease: Secondary | ICD-10-CM | POA: Diagnosis not present

## 2016-11-13 DIAGNOSIS — E119 Type 2 diabetes mellitus without complications: Secondary | ICD-10-CM | POA: Diagnosis not present

## 2016-11-15 ENCOUNTER — Encounter (HOSPITAL_COMMUNITY): Payer: Self-pay | Admitting: *Deleted

## 2016-11-15 DIAGNOSIS — N2581 Secondary hyperparathyroidism of renal origin: Secondary | ICD-10-CM | POA: Diagnosis not present

## 2016-11-15 DIAGNOSIS — D689 Coagulation defect, unspecified: Secondary | ICD-10-CM | POA: Diagnosis not present

## 2016-11-15 DIAGNOSIS — E119 Type 2 diabetes mellitus without complications: Secondary | ICD-10-CM | POA: Diagnosis not present

## 2016-11-15 DIAGNOSIS — R52 Pain, unspecified: Secondary | ICD-10-CM | POA: Diagnosis not present

## 2016-11-15 DIAGNOSIS — N186 End stage renal disease: Secondary | ICD-10-CM | POA: Diagnosis not present

## 2016-11-15 DIAGNOSIS — E1129 Type 2 diabetes mellitus with other diabetic kidney complication: Secondary | ICD-10-CM | POA: Diagnosis not present

## 2016-11-16 ENCOUNTER — Encounter (HOSPITAL_COMMUNITY)
Admission: RE | Disposition: A | Discharge status: Home / Self Care | Payer: Self-pay | Source: Ambulatory Visit | Attending: Vascular Surgery

## 2016-11-16 ENCOUNTER — Ambulatory Visit (HOSPITAL_COMMUNITY)
Admission: RE | Admit: 2016-11-16 | Discharge: 2016-11-16 | Disposition: A | Discharge status: Home / Self Care | Payer: Medicare Other | Source: Ambulatory Visit | Attending: Vascular Surgery | Admitting: Vascular Surgery

## 2016-11-16 ENCOUNTER — Ambulatory Visit (HOSPITAL_COMMUNITY): Payer: Medicare Other | Admitting: Certified Registered"

## 2016-11-16 ENCOUNTER — Encounter (HOSPITAL_COMMUNITY): Payer: Self-pay | Admitting: *Deleted

## 2016-11-16 DIAGNOSIS — I132 Hypertensive heart and chronic kidney disease with heart failure and with stage 5 chronic kidney disease, or end stage renal disease: Secondary | ICD-10-CM | POA: Diagnosis not present

## 2016-11-16 DIAGNOSIS — T82898A Other specified complication of vascular prosthetic devices, implants and grafts, initial encounter: Secondary | ICD-10-CM | POA: Insufficient documentation

## 2016-11-16 DIAGNOSIS — N186 End stage renal disease: Secondary | ICD-10-CM | POA: Insufficient documentation

## 2016-11-16 DIAGNOSIS — M1990 Unspecified osteoarthritis, unspecified site: Secondary | ICD-10-CM | POA: Diagnosis not present

## 2016-11-16 DIAGNOSIS — Y828 Other medical devices associated with adverse incidents: Secondary | ICD-10-CM | POA: Diagnosis not present

## 2016-11-16 DIAGNOSIS — I2510 Atherosclerotic heart disease of native coronary artery without angina pectoris: Secondary | ICD-10-CM | POA: Diagnosis not present

## 2016-11-16 DIAGNOSIS — Z7982 Long term (current) use of aspirin: Secondary | ICD-10-CM | POA: Insufficient documentation

## 2016-11-16 DIAGNOSIS — Z79899 Other long term (current) drug therapy: Secondary | ICD-10-CM | POA: Diagnosis not present

## 2016-11-16 DIAGNOSIS — Z992 Dependence on renal dialysis: Secondary | ICD-10-CM | POA: Insufficient documentation

## 2016-11-16 DIAGNOSIS — Z888 Allergy status to other drugs, medicaments and biological substances status: Secondary | ICD-10-CM | POA: Insufficient documentation

## 2016-11-16 DIAGNOSIS — I252 Old myocardial infarction: Secondary | ICD-10-CM | POA: Diagnosis not present

## 2016-11-16 DIAGNOSIS — Z951 Presence of aortocoronary bypass graft: Secondary | ICD-10-CM | POA: Diagnosis not present

## 2016-11-16 DIAGNOSIS — I509 Heart failure, unspecified: Secondary | ICD-10-CM | POA: Insufficient documentation

## 2016-11-16 DIAGNOSIS — E1122 Type 2 diabetes mellitus with diabetic chronic kidney disease: Secondary | ICD-10-CM | POA: Insufficient documentation

## 2016-11-16 HISTORY — PX: REVISION OF ARTERIOVENOUS GORETEX GRAFT: SHX6073

## 2016-11-16 LAB — GLUCOSE, CAPILLARY
GLUCOSE-CAPILLARY: 71 mg/dL (ref 65–99)
Glucose-Capillary: 94 mg/dL (ref 65–99)
Glucose-Capillary: 69 mg/dL (ref 65–99)

## 2016-11-16 LAB — POCT I-STAT 4, (NA,K, GLUC, HGB,HCT)
GLUCOSE: 65 mg/dL (ref 65–99)
HEMATOCRIT: 36 % — AB (ref 39.0–52.0)
HEMOGLOBIN: 12.2 g/dL — AB (ref 13.0–17.0)
POTASSIUM: 4.9 mmol/L (ref 3.5–5.1)
SODIUM: 139 mmol/L (ref 135–145)

## 2016-11-16 SURGERY — REVISION OF ARTERIOVENOUS GORETEX GRAFT
Anesthesia: Monitor Anesthesia Care | Site: Arm Lower | Laterality: Left

## 2016-11-16 MED ORDER — 0.9 % SODIUM CHLORIDE (POUR BTL) OPTIME
TOPICAL | Status: DC | PRN
  Administered 2016-11-16: 1000 mL

## 2016-11-16 MED ORDER — CEFUROXIME SODIUM 1.5 G IV SOLR
1.5000 g | INTRAVENOUS | Status: AC
  Administered 2016-11-16: 1.5 g via INTRAVENOUS
  Filled 2016-11-16: qty 1.5

## 2016-11-16 MED ORDER — PROTAMINE SULFATE 10 MG/ML IV SOLN
INTRAVENOUS | Status: DC | PRN
  Administered 2016-11-16: 10 mg via INTRAVENOUS
  Administered 2016-11-16 (×2): 20 mg via INTRAVENOUS

## 2016-11-16 MED ORDER — FENTANYL CITRATE (PF) 250 MCG/5ML IJ SOLN
INTRAMUSCULAR | Status: DC | PRN
  Administered 2016-11-16: 100 ug via INTRAVENOUS

## 2016-11-16 MED ORDER — PHENYLEPHRINE HCL 10 MG/ML IJ SOLN
INTRAMUSCULAR | Status: DC | PRN
  Administered 2016-11-16: 50 ug/min via INTRAVENOUS

## 2016-11-16 MED ORDER — CHLORHEXIDINE GLUCONATE 4 % EX LIQD: 60.0000 mL | Freq: Once | CUTANEOUS | Status: DC

## 2016-11-16 MED ORDER — SODIUM CHLORIDE 0.9 % IV SOLN
INTRAVENOUS | Status: DC
  Administered 2016-11-16: 08:00:00 via INTRAVENOUS

## 2016-11-16 MED ORDER — FENTANYL CITRATE (PF) 250 MCG/5ML IJ SOLN
INTRAMUSCULAR | Status: AC
  Filled 2016-11-16: qty 5

## 2016-11-16 MED ORDER — DEXTROSE 50 % IV SOLN
INTRAVENOUS | Status: AC
  Filled 2016-11-16: qty 50

## 2016-11-16 MED ORDER — OXYCODONE-ACETAMINOPHEN 5-325 MG PO TABS: 1.0000 | ORAL_TABLET | Freq: Four times a day (QID) | ORAL | 0 refills | Status: DC | PRN

## 2016-11-16 MED ORDER — LIDOCAINE 2% (20 MG/ML) 5 ML SYRINGE
INTRAMUSCULAR | Status: DC | PRN
  Administered 2016-11-16: 60 mg via INTRAVENOUS

## 2016-11-16 MED ORDER — FENTANYL CITRATE (PF) 100 MCG/2ML IJ SOLN: 25.0000 ug | INTRAMUSCULAR | Status: DC | PRN

## 2016-11-16 MED ORDER — HEPARIN SODIUM (PORCINE) 1000 UNIT/ML IJ SOLN
INTRAMUSCULAR | Status: DC | PRN
  Administered 2016-11-16: 9000 [IU] via INTRAVENOUS

## 2016-11-16 MED ORDER — LIDOCAINE-EPINEPHRINE (PF) 1 %-1:200000 IJ SOLN
INTRAMUSCULAR | Status: DC | PRN
  Administered 2016-11-16: 15 mL

## 2016-11-16 MED ORDER — THROMBIN 20000 UNITS EX SOLR
CUTANEOUS | Status: AC
  Filled 2016-11-16: qty 20000

## 2016-11-16 MED ORDER — DEXAMETHASONE SODIUM PHOSPHATE 10 MG/ML IJ SOLN
INTRAMUSCULAR | Status: AC
  Filled 2016-11-16: qty 1

## 2016-11-16 MED ORDER — PROPOFOL 500 MG/50ML IV EMUL
INTRAVENOUS | Status: DC | PRN
  Administered 2016-11-16: 100 ug/kg/min via INTRAVENOUS

## 2016-11-16 MED ORDER — LIDOCAINE 2% (20 MG/ML) 5 ML SYRINGE
INTRAMUSCULAR | Status: AC
  Filled 2016-11-16: qty 5

## 2016-11-16 MED ORDER — PROPOFOL 10 MG/ML IV BOLUS
INTRAVENOUS | Status: AC
  Filled 2016-11-16: qty 20

## 2016-11-16 MED ORDER — PROTAMINE SULFATE 10 MG/ML IV SOLN
INTRAVENOUS | Status: AC
  Filled 2016-11-16: qty 5

## 2016-11-16 MED ORDER — LIDOCAINE HCL (PF) 1 % IJ SOLN
INTRAMUSCULAR | Status: AC
  Filled 2016-11-16: qty 30

## 2016-11-16 MED ORDER — DEXTROSE 50 % IV SOLN
25.0000 mL | Freq: Once | INTRAVENOUS | Status: AC
  Administered 2016-11-16: 25 mL via INTRAVENOUS

## 2016-11-16 MED ORDER — LIDOCAINE-EPINEPHRINE (PF) 1 %-1:200000 IJ SOLN
INTRAMUSCULAR | Status: AC
  Filled 2016-11-16: qty 30

## 2016-11-16 MED ORDER — HEPARIN SODIUM (PORCINE) 1000 UNIT/ML IJ SOLN
INTRAMUSCULAR | Status: AC
  Filled 2016-11-16: qty 1

## 2016-11-16 MED ORDER — SODIUM CHLORIDE 0.9 % IV SOLN
INTRAVENOUS | Status: DC | PRN
  Administered 2016-11-16: 10:00:00 500 mL

## 2016-11-16 SURGICAL SUPPLY — 31 items
ADH SKN CLS APL DERMABOND .7 (GAUZE/BANDAGES/DRESSINGS) ×1
CANISTER SUCT 3000ML PPV (MISCELLANEOUS) ×3 IMPLANT
CANNULA VESSEL 3MM 2 BLNT TIP (CANNULA) ×3 IMPLANT
CLIP TI MEDIUM 6 (CLIP) ×3 IMPLANT
CLIP TI WIDE RED SMALL 6 (CLIP) ×3 IMPLANT
DECANTER SPIKE VIAL GLASS SM (MISCELLANEOUS) ×3 IMPLANT
DERMABOND ADVANCED (GAUZE/BANDAGES/DRESSINGS) ×2
DERMABOND ADVANCED .7 DNX12 (GAUZE/BANDAGES/DRESSINGS) ×1 IMPLANT
DRAPE INCISE IOBAN 66X45 STRL (DRAPES) ×1 IMPLANT
ELECT REM PT RETURN 9FT ADLT (ELECTROSURGICAL) ×3
ELECTRODE REM PT RTRN 9FT ADLT (ELECTROSURGICAL) ×1 IMPLANT
GLOVE BIO SURGEON STRL SZ7.5 (GLOVE) ×3 IMPLANT
GLOVE BIOGEL PI IND STRL 8 (GLOVE) ×1 IMPLANT
GLOVE BIOGEL PI INDICATOR 8 (GLOVE) ×2
GOWN STRL REUS W/ TWL LRG LVL3 (GOWN DISPOSABLE) ×3 IMPLANT
GOWN STRL REUS W/TWL LRG LVL3 (GOWN DISPOSABLE) ×12
GRAFT GORETEX STRETCH 8MX10CM (Vascular Products) ×2 IMPLANT
KIT BASIN OR (CUSTOM PROCEDURE TRAY) ×3 IMPLANT
KIT ROOM TURNOVER OR (KITS) ×3 IMPLANT
NDL HYPO 25GX1X1/2 BEV (NEEDLE) ×1 IMPLANT
NEEDLE HYPO 25GX1X1/2 BEV (NEEDLE) ×3 IMPLANT
NS IRRIG 1000ML POUR BTL (IV SOLUTION) ×3 IMPLANT
PACK CV ACCESS (CUSTOM PROCEDURE TRAY) ×3 IMPLANT
PAD ARMBOARD 7.5X6 YLW CONV (MISCELLANEOUS) ×6 IMPLANT
SPONGE SURGIFOAM ABS GEL 100 (HEMOSTASIS) IMPLANT
SUT PROLENE 6 0 BV (SUTURE) ×8 IMPLANT
SUT VIC AB 3-0 SH 27 (SUTURE) ×6
SUT VIC AB 3-0 SH 27X BRD (SUTURE) ×2 IMPLANT
SUT VICRYL 4-0 PS2 18IN ABS (SUTURE) ×6 IMPLANT
UNDERPAD 30X30 (UNDERPADS AND DIAPERS) ×3 IMPLANT
WATER STERILE IRR 1000ML POUR (IV SOLUTION) ×3 IMPLANT

## 2016-11-16 NOTE — H&P (View-Only) (Signed)
   History and Physical Update  The patient was interviewed and re-examined.  The patient's previous History and Physical has been reviewed and is unchanged from Dr. Edilia Boickson office visit. Plan for left arm fistulogram with possible intervention.   Brandon C. Randie Heinzain, MD [facility] Office: [phone] Pager: [phone]  10/31/2016, 10:37 AM

## 2016-11-16 NOTE — Op Note (Signed)
    NAME: Ian BlakeWarren J Mo    MRN: [id number] DOB: 08/02/1935    DATE OF OPERATION: 11/16/2016  PREOP DIAGNOSIS:    Aneurysm of left forearm AV graft  POSTOP DIAGNOSIS:    Same  PROCEDURE:    REVISION OF LEFT FOREARM AV GRAFT (REPLACED VENOUS HALF OF THE GRAFT ALONG THE ULNAR ASPECT OF THE FOREARM)   SURGEON: Di Kindlehristopher S. Edilia Boickson, MD, FACS  ASSIST: Karsten RoKim Trinh, [facility]  ANESTHESIA: Local with sedation   EBL: Minimal  INDICATIONS:    Ian BlakeWarren J Turner is a 81 y.o. male who had a large aneurysm along the ulnar aspect of his left forearm AV graft. This was the venous aspect of the graft. He underwent preoperative venogram and venoplasty of some outflow stenoses. He presents for revision of the ulnar aspect of his graft.  FINDINGS:    Excellent thrill at the completion of the procedure.  TECHNIQUE:    The patient was taken to the operating room and was sedated. The left arm was prepped and draped in usual sterile fashion. After the skin was anesthetized with 1% lidocaine, an incision was made over the graft at the distal loop of the graft. Again, after the skin was anesthetized, an incision was made over the venous limb of the graft below the antecubital space. This was along the ulnar aspect of the forearm. After the skin was anesthetized, a tunnel was created between the 2 incisions medial to the old graft. A 8 mm graft was tunneled between the 2 incisions and the patient was heparinized. The graft was clamped at both ends and divided. The graft was spatulated and the new segment of graft spatulated and sewn into and both ends with running 6-0 Prolene suture. At the completion was a good thrill in the graft. Hemostasis was obtained in the wounds and the heparin was partially reversed with protamine. The wounds were closed the deep layer of 3-0 Vicryl and the skin closed with 4-0 Vicryl. Dermabond was applied. The patient tolerated the procedure well and was transferred to the recovery room in  stable condition. All needle and sponge counts were correct.  Waverly Ferrarihristopher Dickson, MD, FACS [facility]  DATE OF DICTATION:   11/16/2016

## 2016-11-16 NOTE — Anesthesia Preprocedure Evaluation (Addendum)
Anesthesia Evaluation  Patient identified by MRN, date of birth, ID band Patient awake    Reviewed: Allergy & Precautions, NPO status , Patient's Chart, lab work & pertinent test results  Airway Mallampati: II  TM Distance: >3 FB     Dental   Pulmonary pneumonia,    breath sounds clear to auscultation       Cardiovascular hypertension, + angina + CAD, + Past MI and +CHF   Rhythm:Regular Rate:Normal     Neuro/Psych    GI/Hepatic negative GI ROS, Neg liver ROS,   Endo/Other  diabetes  Renal/GU Renal disease     Musculoskeletal  (+) Arthritis ,   Abdominal   Peds  Hematology   Anesthesia Other Findings   Reproductive/Obstetrics                             Anesthesia Physical Anesthesia Plan  ASA: III  Anesthesia Plan: MAC   Post-op Pain Management:    Induction: Intravenous  PONV Risk Score and Plan: 3 and Ondansetron, Propofol, Midazolam, Treatment may vary due to age or medical condition and Dexamethasone  Airway Management Planned: Simple Face Mask  Additional Equipment:   Intra-op Plan:   Post-operative Plan:   Informed Consent: I have reviewed the patients History and Physical, chart, labs and discussed the procedure including the risks, benefits and alternatives for the proposed anesthesia with the patient or authorized representative who has indicated his/her understanding and acceptance.   Dental advisory given  Plan Discussed with: CRNA and Anesthesiologist  Anesthesia Plan Comments:         Anesthesia Quick Evaluation

## 2016-11-16 NOTE — Anesthesia Procedure Notes (Signed)
Procedure Name: MAC Date/Time: 11/16/2016 9:20 AM Performed by: Myna Bright Pre-anesthesia Checklist: Patient identified, Suction available, Emergency Drugs available and Patient being monitored Patient Re-evaluated:Patient Re-evaluated prior to inductionOxygen Delivery Method: Simple face mask Placement Confirmation: positive ETCO2 and breath sounds checked- equal and bilateral

## 2016-11-16 NOTE — Transfer of Care (Signed)
Immediate Anesthesia Transfer of Care Note  Patient: [name]  Procedure(s) Performed: Procedure(s): REVISION OF ARTERIOVENOUS GORETEX GRAFT (Left)  Patient Location: PACU  Anesthesia Type:MAC  Level of Consciousness: drowsy and responds to stimulation  Airway & Oxygen Therapy: Patient Spontanous Breathing  Post-op Assessment: Report given to RN and Post -op Vital signs reviewed and stable  Post vital signs: Reviewed and stable  Last Vitals:  Vitals:   11/16/16 0746  BP: (!) 152/41  Pulse: 67  Resp: 18  Temp: 36.6 C    Last Pain:  Vitals:   11/16/16 0746  TempSrc: Oral  PainSc:          Complications: No apparent anesthesia complications

## 2016-11-16 NOTE — Interval H&P Note (Signed)
History and Physical Interval Note:  11/16/2016 8:16 AM  [name]  has presented today for surgery, with the diagnosis of End stage renal disease  The various methods of treatment have been discussed with the patient and family. After consideration of risks, benefits and other options for treatment, the patient has consented to  Procedure(s): REVISION OF ARTERIOVENOUS GORETEX GRAFT (Left) as a surgical intervention .  The patient's history has been reviewed, patient examined, no change in status, stable for surgery.  I have reviewed the patient's chart and labs.  Questions were answered to the patient's satisfaction.     Waverly Ferrariickson, Christopher

## 2016-11-16 NOTE — Anesthesia Postprocedure Evaluation (Signed)
Anesthesia Post Note  Patient: [name]  Procedure(s) Performed: Procedure(s) (LRB): REVISION OF ARTERIOVENOUS GORETEX GRAFT (Left)     Patient location during evaluation: PACU Anesthesia Type: MAC Level of consciousness: awake Pain management: pain level controlled Vital Signs Assessment: post-procedure vital signs reviewed and stable Respiratory status: spontaneous breathing Cardiovascular status: stable Anesthetic complications: no    Last Vitals:  Vitals:   11/16/16 1130 11/16/16 1136  BP:  (!) 142/63  Pulse: 65 66  Resp: 17   Temp: 36.2 C     Last Pain:  Vitals:   11/16/16 1136  TempSrc:   PainSc: 0-No pain                 Rea Kalama [place]

## 2016-11-17 DIAGNOSIS — Z992 Dependence on renal dialysis: Secondary | ICD-10-CM | POA: Diagnosis not present

## 2016-11-17 DIAGNOSIS — D689 Coagulation defect, unspecified: Secondary | ICD-10-CM | POA: Diagnosis not present

## 2016-11-17 DIAGNOSIS — R52 Pain, unspecified: Secondary | ICD-10-CM | POA: Diagnosis not present

## 2016-11-17 DIAGNOSIS — E1129 Type 2 diabetes mellitus with other diabetic kidney complication: Secondary | ICD-10-CM | POA: Diagnosis not present

## 2016-11-17 DIAGNOSIS — E119 Type 2 diabetes mellitus without complications: Secondary | ICD-10-CM | POA: Diagnosis not present

## 2016-11-17 DIAGNOSIS — N2581 Secondary hyperparathyroidism of renal origin: Secondary | ICD-10-CM | POA: Diagnosis not present

## 2016-11-17 DIAGNOSIS — N186 End stage renal disease: Secondary | ICD-10-CM | POA: Diagnosis not present

## 2016-11-19 ENCOUNTER — Encounter (HOSPITAL_COMMUNITY): Payer: Self-pay | Admitting: Vascular Surgery

## 2016-11-20 DIAGNOSIS — R52 Pain, unspecified: Secondary | ICD-10-CM | POA: Diagnosis not present

## 2016-11-20 DIAGNOSIS — E119 Type 2 diabetes mellitus without complications: Secondary | ICD-10-CM | POA: Diagnosis not present

## 2016-11-20 DIAGNOSIS — E1129 Type 2 diabetes mellitus with other diabetic kidney complication: Secondary | ICD-10-CM | POA: Diagnosis not present

## 2016-11-20 DIAGNOSIS — N2581 Secondary hyperparathyroidism of renal origin: Secondary | ICD-10-CM | POA: Diagnosis not present

## 2016-11-20 DIAGNOSIS — D689 Coagulation defect, unspecified: Secondary | ICD-10-CM | POA: Diagnosis not present

## 2016-11-20 DIAGNOSIS — N186 End stage renal disease: Secondary | ICD-10-CM | POA: Diagnosis not present

## 2016-11-22 DIAGNOSIS — R52 Pain, unspecified: Secondary | ICD-10-CM | POA: Diagnosis not present

## 2016-11-22 DIAGNOSIS — D689 Coagulation defect, unspecified: Secondary | ICD-10-CM | POA: Diagnosis not present

## 2016-11-22 DIAGNOSIS — N2581 Secondary hyperparathyroidism of renal origin: Secondary | ICD-10-CM | POA: Diagnosis not present

## 2016-11-22 DIAGNOSIS — E119 Type 2 diabetes mellitus without complications: Secondary | ICD-10-CM | POA: Diagnosis not present

## 2016-11-22 DIAGNOSIS — N186 End stage renal disease: Secondary | ICD-10-CM | POA: Diagnosis not present

## 2016-11-22 DIAGNOSIS — E1129 Type 2 diabetes mellitus with other diabetic kidney complication: Secondary | ICD-10-CM | POA: Diagnosis not present

## 2016-11-24 DIAGNOSIS — R52 Pain, unspecified: Secondary | ICD-10-CM | POA: Diagnosis not present

## 2016-11-24 DIAGNOSIS — D689 Coagulation defect, unspecified: Secondary | ICD-10-CM | POA: Diagnosis not present

## 2016-11-24 DIAGNOSIS — N2581 Secondary hyperparathyroidism of renal origin: Secondary | ICD-10-CM | POA: Diagnosis not present

## 2016-11-24 DIAGNOSIS — E1129 Type 2 diabetes mellitus with other diabetic kidney complication: Secondary | ICD-10-CM | POA: Diagnosis not present

## 2016-11-24 DIAGNOSIS — E119 Type 2 diabetes mellitus without complications: Secondary | ICD-10-CM | POA: Diagnosis not present

## 2016-11-24 DIAGNOSIS — N186 End stage renal disease: Secondary | ICD-10-CM | POA: Diagnosis not present

## 2016-11-27 DIAGNOSIS — R52 Pain, unspecified: Secondary | ICD-10-CM | POA: Diagnosis not present

## 2016-11-27 DIAGNOSIS — N2581 Secondary hyperparathyroidism of renal origin: Secondary | ICD-10-CM | POA: Diagnosis not present

## 2016-11-27 DIAGNOSIS — E1129 Type 2 diabetes mellitus with other diabetic kidney complication: Secondary | ICD-10-CM | POA: Diagnosis not present

## 2016-11-27 DIAGNOSIS — D689 Coagulation defect, unspecified: Secondary | ICD-10-CM | POA: Diagnosis not present

## 2016-11-27 DIAGNOSIS — E119 Type 2 diabetes mellitus without complications: Secondary | ICD-10-CM | POA: Diagnosis not present

## 2016-11-27 DIAGNOSIS — N186 End stage renal disease: Secondary | ICD-10-CM | POA: Diagnosis not present

## 2016-11-29 DIAGNOSIS — N186 End stage renal disease: Secondary | ICD-10-CM | POA: Diagnosis not present

## 2016-11-29 DIAGNOSIS — E119 Type 2 diabetes mellitus without complications: Secondary | ICD-10-CM | POA: Diagnosis not present

## 2016-11-29 DIAGNOSIS — R52 Pain, unspecified: Secondary | ICD-10-CM | POA: Diagnosis not present

## 2016-11-29 DIAGNOSIS — N2581 Secondary hyperparathyroidism of renal origin: Secondary | ICD-10-CM | POA: Diagnosis not present

## 2016-11-29 DIAGNOSIS — E1129 Type 2 diabetes mellitus with other diabetic kidney complication: Secondary | ICD-10-CM | POA: Diagnosis not present

## 2016-11-29 DIAGNOSIS — D689 Coagulation defect, unspecified: Secondary | ICD-10-CM | POA: Diagnosis not present

## 2016-12-01 DIAGNOSIS — D689 Coagulation defect, unspecified: Secondary | ICD-10-CM | POA: Diagnosis not present

## 2016-12-01 DIAGNOSIS — E1129 Type 2 diabetes mellitus with other diabetic kidney complication: Secondary | ICD-10-CM | POA: Diagnosis not present

## 2016-12-01 DIAGNOSIS — N2581 Secondary hyperparathyroidism of renal origin: Secondary | ICD-10-CM | POA: Diagnosis not present

## 2016-12-01 DIAGNOSIS — R52 Pain, unspecified: Secondary | ICD-10-CM | POA: Diagnosis not present

## 2016-12-01 DIAGNOSIS — N186 End stage renal disease: Secondary | ICD-10-CM | POA: Diagnosis not present

## 2016-12-01 DIAGNOSIS — E119 Type 2 diabetes mellitus without complications: Secondary | ICD-10-CM | POA: Diagnosis not present

## 2016-12-04 DIAGNOSIS — N2581 Secondary hyperparathyroidism of renal origin: Secondary | ICD-10-CM | POA: Diagnosis not present

## 2016-12-04 DIAGNOSIS — D689 Coagulation defect, unspecified: Secondary | ICD-10-CM | POA: Diagnosis not present

## 2016-12-04 DIAGNOSIS — E119 Type 2 diabetes mellitus without complications: Secondary | ICD-10-CM | POA: Diagnosis not present

## 2016-12-04 DIAGNOSIS — R52 Pain, unspecified: Secondary | ICD-10-CM | POA: Diagnosis not present

## 2016-12-04 DIAGNOSIS — N186 End stage renal disease: Secondary | ICD-10-CM | POA: Diagnosis not present

## 2016-12-04 DIAGNOSIS — E1129 Type 2 diabetes mellitus with other diabetic kidney complication: Secondary | ICD-10-CM | POA: Diagnosis not present

## 2016-12-06 DIAGNOSIS — D689 Coagulation defect, unspecified: Secondary | ICD-10-CM | POA: Diagnosis not present

## 2016-12-06 DIAGNOSIS — E1129 Type 2 diabetes mellitus with other diabetic kidney complication: Secondary | ICD-10-CM | POA: Diagnosis not present

## 2016-12-06 DIAGNOSIS — L03115 Cellulitis of right lower limb: Secondary | ICD-10-CM | POA: Diagnosis not present

## 2016-12-06 DIAGNOSIS — N186 End stage renal disease: Secondary | ICD-10-CM | POA: Diagnosis not present

## 2016-12-06 DIAGNOSIS — R52 Pain, unspecified: Secondary | ICD-10-CM | POA: Diagnosis not present

## 2016-12-06 DIAGNOSIS — N2581 Secondary hyperparathyroidism of renal origin: Secondary | ICD-10-CM | POA: Diagnosis not present

## 2016-12-06 DIAGNOSIS — E119 Type 2 diabetes mellitus without complications: Secondary | ICD-10-CM | POA: Diagnosis not present

## 2016-12-06 DIAGNOSIS — B353 Tinea pedis: Secondary | ICD-10-CM | POA: Diagnosis not present

## 2016-12-08 DIAGNOSIS — N2581 Secondary hyperparathyroidism of renal origin: Secondary | ICD-10-CM | POA: Diagnosis not present

## 2016-12-08 DIAGNOSIS — N186 End stage renal disease: Secondary | ICD-10-CM | POA: Diagnosis not present

## 2016-12-08 DIAGNOSIS — E119 Type 2 diabetes mellitus without complications: Secondary | ICD-10-CM | POA: Diagnosis not present

## 2016-12-08 DIAGNOSIS — E1129 Type 2 diabetes mellitus with other diabetic kidney complication: Secondary | ICD-10-CM | POA: Diagnosis not present

## 2016-12-08 DIAGNOSIS — R52 Pain, unspecified: Secondary | ICD-10-CM | POA: Diagnosis not present

## 2016-12-08 DIAGNOSIS — D689 Coagulation defect, unspecified: Secondary | ICD-10-CM | POA: Diagnosis not present

## 2016-12-13 DIAGNOSIS — N2581 Secondary hyperparathyroidism of renal origin: Secondary | ICD-10-CM | POA: Diagnosis not present

## 2016-12-13 DIAGNOSIS — N186 End stage renal disease: Secondary | ICD-10-CM | POA: Diagnosis not present

## 2016-12-13 DIAGNOSIS — R52 Pain, unspecified: Secondary | ICD-10-CM | POA: Diagnosis not present

## 2016-12-13 DIAGNOSIS — L97512 Non-pressure chronic ulcer of other part of right foot with fat layer exposed: Secondary | ICD-10-CM | POA: Diagnosis not present

## 2016-12-13 DIAGNOSIS — E1129 Type 2 diabetes mellitus with other diabetic kidney complication: Secondary | ICD-10-CM | POA: Diagnosis not present

## 2016-12-13 DIAGNOSIS — L03115 Cellulitis of right lower limb: Secondary | ICD-10-CM | POA: Diagnosis not present

## 2016-12-13 DIAGNOSIS — E119 Type 2 diabetes mellitus without complications: Secondary | ICD-10-CM | POA: Diagnosis not present

## 2016-12-13 DIAGNOSIS — I779 Disorder of arteries and arterioles, unspecified: Secondary | ICD-10-CM | POA: Diagnosis not present

## 2016-12-13 DIAGNOSIS — D689 Coagulation defect, unspecified: Secondary | ICD-10-CM | POA: Diagnosis not present

## 2016-12-15 DIAGNOSIS — D689 Coagulation defect, unspecified: Secondary | ICD-10-CM | POA: Diagnosis not present

## 2016-12-15 DIAGNOSIS — E119 Type 2 diabetes mellitus without complications: Secondary | ICD-10-CM | POA: Diagnosis not present

## 2016-12-15 DIAGNOSIS — R52 Pain, unspecified: Secondary | ICD-10-CM | POA: Diagnosis not present

## 2016-12-15 DIAGNOSIS — N186 End stage renal disease: Secondary | ICD-10-CM | POA: Diagnosis not present

## 2016-12-15 DIAGNOSIS — E1129 Type 2 diabetes mellitus with other diabetic kidney complication: Secondary | ICD-10-CM | POA: Diagnosis not present

## 2016-12-15 DIAGNOSIS — N2581 Secondary hyperparathyroidism of renal origin: Secondary | ICD-10-CM | POA: Diagnosis not present

## 2016-12-18 DIAGNOSIS — Z992 Dependence on renal dialysis: Secondary | ICD-10-CM | POA: Diagnosis not present

## 2016-12-18 DIAGNOSIS — N186 End stage renal disease: Secondary | ICD-10-CM | POA: Diagnosis not present

## 2016-12-18 DIAGNOSIS — N2581 Secondary hyperparathyroidism of renal origin: Secondary | ICD-10-CM | POA: Diagnosis not present

## 2016-12-18 DIAGNOSIS — D689 Coagulation defect, unspecified: Secondary | ICD-10-CM | POA: Diagnosis not present

## 2016-12-18 DIAGNOSIS — E1129 Type 2 diabetes mellitus with other diabetic kidney complication: Secondary | ICD-10-CM | POA: Diagnosis not present

## 2016-12-18 DIAGNOSIS — R52 Pain, unspecified: Secondary | ICD-10-CM | POA: Diagnosis not present

## 2016-12-18 DIAGNOSIS — E119 Type 2 diabetes mellitus without complications: Secondary | ICD-10-CM | POA: Diagnosis not present

## 2016-12-19 DIAGNOSIS — L03115 Cellulitis of right lower limb: Secondary | ICD-10-CM | POA: Diagnosis not present

## 2016-12-19 DIAGNOSIS — M7989 Other specified soft tissue disorders: Secondary | ICD-10-CM | POA: Diagnosis not present

## 2016-12-19 DIAGNOSIS — L97511 Non-pressure chronic ulcer of other part of right foot limited to breakdown of skin: Secondary | ICD-10-CM | POA: Diagnosis not present

## 2016-12-19 DIAGNOSIS — N189 Chronic kidney disease, unspecified: Secondary | ICD-10-CM | POA: Diagnosis not present

## 2016-12-22 DIAGNOSIS — R52 Pain, unspecified: Secondary | ICD-10-CM | POA: Diagnosis not present

## 2016-12-22 DIAGNOSIS — N2581 Secondary hyperparathyroidism of renal origin: Secondary | ICD-10-CM | POA: Diagnosis not present

## 2016-12-22 DIAGNOSIS — D689 Coagulation defect, unspecified: Secondary | ICD-10-CM | POA: Diagnosis not present

## 2016-12-22 DIAGNOSIS — D631 Anemia in chronic kidney disease: Secondary | ICD-10-CM | POA: Diagnosis not present

## 2016-12-22 DIAGNOSIS — E1129 Type 2 diabetes mellitus with other diabetic kidney complication: Secondary | ICD-10-CM | POA: Diagnosis not present

## 2016-12-22 DIAGNOSIS — N186 End stage renal disease: Secondary | ICD-10-CM | POA: Diagnosis not present

## 2016-12-22 DIAGNOSIS — L03115 Cellulitis of right lower limb: Secondary | ICD-10-CM | POA: Diagnosis not present

## 2016-12-23 ENCOUNTER — Emergency Department (HOSPITAL_COMMUNITY): Payer: Medicare Other

## 2016-12-23 ENCOUNTER — Emergency Department (HOSPITAL_COMMUNITY)
Admission: EM | Admit: 2016-12-23 | Discharge: 2016-12-24 | Disposition: A | Discharge status: Home / Self Care | Payer: Medicare Other | Source: Home / Self Care | Attending: Emergency Medicine | Admitting: Emergency Medicine

## 2016-12-23 ENCOUNTER — Encounter (HOSPITAL_COMMUNITY): Payer: Self-pay | Admitting: Emergency Medicine

## 2016-12-23 DIAGNOSIS — E875 Hyperkalemia: Secondary | ICD-10-CM | POA: Insufficient documentation

## 2016-12-23 DIAGNOSIS — L97519 Non-pressure chronic ulcer of other part of right foot with unspecified severity: Secondary | ICD-10-CM | POA: Insufficient documentation

## 2016-12-23 DIAGNOSIS — F17290 Nicotine dependence, other tobacco product, uncomplicated: Secondary | ICD-10-CM | POA: Insufficient documentation

## 2016-12-23 DIAGNOSIS — Z9115 Patient's noncompliance with renal dialysis: Secondary | ICD-10-CM | POA: Diagnosis not present

## 2016-12-23 DIAGNOSIS — E1152 Type 2 diabetes mellitus with diabetic peripheral angiopathy with gangrene: Secondary | ICD-10-CM | POA: Diagnosis not present

## 2016-12-23 DIAGNOSIS — N2581 Secondary hyperparathyroidism of renal origin: Secondary | ICD-10-CM | POA: Diagnosis not present

## 2016-12-23 DIAGNOSIS — E11649 Type 2 diabetes mellitus with hypoglycemia without coma: Secondary | ICD-10-CM | POA: Diagnosis not present

## 2016-12-23 DIAGNOSIS — Z7982 Long term (current) use of aspirin: Secondary | ICD-10-CM | POA: Diagnosis not present

## 2016-12-23 DIAGNOSIS — I509 Heart failure, unspecified: Secondary | ICD-10-CM | POA: Insufficient documentation

## 2016-12-23 DIAGNOSIS — I2510 Atherosclerotic heart disease of native coronary artery without angina pectoris: Secondary | ICD-10-CM

## 2016-12-23 DIAGNOSIS — I70235 Atherosclerosis of native arteries of right leg with ulceration of other part of foot: Secondary | ICD-10-CM | POA: Diagnosis not present

## 2016-12-23 DIAGNOSIS — I998 Other disorder of circulatory system: Secondary | ICD-10-CM | POA: Diagnosis not present

## 2016-12-23 DIAGNOSIS — N186 End stage renal disease: Secondary | ICD-10-CM

## 2016-12-23 DIAGNOSIS — I252 Old myocardial infarction: Secondary | ICD-10-CM | POA: Diagnosis not present

## 2016-12-23 DIAGNOSIS — B958 Unspecified staphylococcus as the cause of diseases classified elsewhere: Secondary | ICD-10-CM

## 2016-12-23 DIAGNOSIS — M7989 Other specified soft tissue disorders: Secondary | ICD-10-CM | POA: Diagnosis not present

## 2016-12-23 DIAGNOSIS — I120 Hypertensive chronic kidney disease with stage 5 chronic kidney disease or end stage renal disease: Secondary | ICD-10-CM | POA: Diagnosis not present

## 2016-12-23 DIAGNOSIS — E1122 Type 2 diabetes mellitus with diabetic chronic kidney disease: Secondary | ICD-10-CM

## 2016-12-23 DIAGNOSIS — D631 Anemia in chronic kidney disease: Secondary | ICD-10-CM | POA: Diagnosis not present

## 2016-12-23 DIAGNOSIS — Z992 Dependence on renal dialysis: Secondary | ICD-10-CM | POA: Insufficient documentation

## 2016-12-23 DIAGNOSIS — Z91041 Radiographic dye allergy status: Secondary | ICD-10-CM | POA: Diagnosis not present

## 2016-12-23 DIAGNOSIS — R9431 Abnormal electrocardiogram [ECG] [EKG]: Secondary | ICD-10-CM | POA: Diagnosis not present

## 2016-12-23 DIAGNOSIS — L089 Local infection of the skin and subcutaneous tissue, unspecified: Secondary | ICD-10-CM | POA: Diagnosis not present

## 2016-12-23 DIAGNOSIS — M1991 Primary osteoarthritis, unspecified site: Secondary | ICD-10-CM | POA: Diagnosis not present

## 2016-12-23 DIAGNOSIS — M549 Dorsalgia, unspecified: Secondary | ICD-10-CM | POA: Diagnosis not present

## 2016-12-23 DIAGNOSIS — J449 Chronic obstructive pulmonary disease, unspecified: Secondary | ICD-10-CM | POA: Diagnosis not present

## 2016-12-23 DIAGNOSIS — Z79899 Other long term (current) drug therapy: Secondary | ICD-10-CM | POA: Insufficient documentation

## 2016-12-23 DIAGNOSIS — R600 Localized edema: Secondary | ICD-10-CM

## 2016-12-23 DIAGNOSIS — L03115 Cellulitis of right lower limb: Secondary | ICD-10-CM | POA: Diagnosis not present

## 2016-12-23 DIAGNOSIS — Z951 Presence of aortocoronary bypass graft: Secondary | ICD-10-CM | POA: Diagnosis not present

## 2016-12-23 DIAGNOSIS — R531 Weakness: Secondary | ICD-10-CM | POA: Diagnosis not present

## 2016-12-23 DIAGNOSIS — I132 Hypertensive heart and chronic kidney disease with heart failure and with stage 5 chronic kidney disease, or end stage renal disease: Secondary | ICD-10-CM | POA: Insufficient documentation

## 2016-12-23 DIAGNOSIS — E11621 Type 2 diabetes mellitus with foot ulcer: Secondary | ICD-10-CM | POA: Diagnosis not present

## 2016-12-23 DIAGNOSIS — Z888 Allergy status to other drugs, medicaments and biological substances status: Secondary | ICD-10-CM | POA: Diagnosis not present

## 2016-12-23 DIAGNOSIS — R079 Chest pain, unspecified: Secondary | ICD-10-CM | POA: Diagnosis not present

## 2016-12-23 LAB — BRAIN NATRIURETIC PEPTIDE: B Natriuretic Peptide: 371.8 pg/mL — ABNORMAL HIGH (ref 0.0–100.0)

## 2016-12-23 LAB — BASIC METABOLIC PANEL
ANION GAP: 17 — AB (ref 5–15)
BUN: 42 mg/dL — ABNORMAL HIGH (ref 6–20)
CALCIUM: 9.6 mg/dL (ref 8.9–10.3)
CHLORIDE: 92 mmol/L — AB (ref 101–111)
CO2: 28 mmol/L (ref 22–32)
Creatinine, Ser: 12.37 mg/dL — ABNORMAL HIGH (ref 0.61–1.24)
GFR calc non Af Amer: 3 mL/min — ABNORMAL LOW (ref >60)
GFR, EST AFRICAN AMERICAN: 4 mL/min — AB (ref >60)
Glucose, Bld: 97 mg/dL (ref 65–99)
POTASSIUM: 6 mmol/L — AB (ref 3.5–5.1)
Sodium: 137 mmol/L (ref 135–145)

## 2016-12-23 LAB — CBC WITH DIFFERENTIAL/PLATELET
BASOS ABS: 0.1 10*3/uL (ref 0.0–0.1)
BASOS PCT: 1 %
Eosinophils Absolute: 0.2 10*3/uL (ref 0.0–0.7)
Eosinophils Relative: 3 %
HCT: 34.7 % — ABNORMAL LOW (ref 39.0–52.0)
HEMOGLOBIN: 11.5 g/dL — AB (ref 13.0–17.0)
LYMPHS PCT: 17 %
Lymphs Abs: 1 10*3/uL (ref 0.7–4.0)
MCH: 31.8 pg (ref 26.0–34.0)
MCHC: 33.1 g/dL (ref 30.0–36.0)
MCV: 95.9 fL (ref 78.0–100.0)
Monocytes Absolute: 0.5 10*3/uL (ref 0.1–1.0)
Monocytes Relative: 8 %
NEUTROS ABS: 4.4 10*3/uL (ref 1.7–7.7)
NEUTROS PCT: 71 %
Platelets: 165 10*3/uL (ref 150–400)
RBC: 3.62 MIL/uL — AB (ref 4.22–5.81)
RDW: 15 % (ref 11.5–15.5)
WBC: 6.1 10*3/uL (ref 4.0–10.5)

## 2016-12-23 LAB — I-STAT CG4 LACTIC ACID, ED: Lactic Acid, Venous: 1.21 mmol/L (ref 0.5–1.9)

## 2016-12-23 MED ORDER — DOXYCYCLINE HYCLATE 100 MG PO CAPS: 100.0000 mg | ORAL_CAPSULE | Freq: Two times a day (BID) | ORAL | 0 refills | Status: DC

## 2016-12-23 MED ORDER — HYDROCODONE-ACETAMINOPHEN 5-325 MG PO TABS: 2.0000 | ORAL_TABLET | ORAL | 0 refills | Status: DC | PRN

## 2016-12-23 MED ORDER — HYDROCODONE-ACETAMINOPHEN 5-325 MG PO TABS
2.0000 | ORAL_TABLET | Freq: Once | ORAL | Status: AC
  Administered 2016-12-23: 2 via ORAL
  Filled 2016-12-23: qty 2

## 2016-12-23 MED ORDER — DOXYCYCLINE HYCLATE 100 MG PO TABS
100.0000 mg | ORAL_TABLET | Freq: Once | ORAL | Status: AC
  Administered 2016-12-23: 100 mg via ORAL
  Filled 2016-12-23: qty 1

## 2016-12-23 NOTE — ED Provider Notes (Signed)
[facility]PT Provider Note   CSN: [id number] Arrival date & time: 12/23/16  1954     History   Chief Complaint Chief Complaint  Patient presents with  . Leg Swelling  . Back Pain    HPI [name] is a 81 y.o. male.  HPI  81 y/o male - ESRD, T,TH,S, has Hypertension - c/o bilateral leg and foot pain and edema - started a month ago - has had some bilateral d/c from the toes - saw his PCP and his Podiatrist - he saw the podiatrist twice in the last month.  Frustrated that he has no improved.  He has ongoing d/c from the toes - he has had back surgery in the past.  No fevers and no CP / SOB or n/v.  He cannot tell me the names of the medicines that he is taking.  Past Medical History:  Diagnosis Date  . Anginal pain (HCC)    before CABG  . Arthritis   . CHF (congestive heart failure) (HCC)   . Constipation   . Coronary artery disease   . Diabetes mellitus   . ESRD (end stage renal disease) (HCC)    TuesThurs Sat [place]  . Hypertension   . Myocardial infarction (HCC)   . Pneumonia    2 years ago    Patient Active Problem List   Diagnosis Date Noted  . End stage renal disease (HCC) 09/09/2013    Past Surgical History:  Procedure Laterality Date  . A/V FISTULAGRAM Left 10/31/2016   Procedure: A/V Fistulagram;  Surgeon: Maeola Harmanain, Brandon Christopher, MD;  [facility];  Service: Cardiovascular;  Laterality: Left;  . ARTERIOVENOUS GRAFT PLACEMENT Left   . BACK SURGERY    . CORONARY ARTERY BYPASS GRAFT    . LAPAROSCOPIC COLON RESECTION     growth  . REVISION OF ARTERIOVENOUS GORETEX GRAFT Left 11/16/2016   Procedure: REVISION OF ARTERIOVENOUS GORETEX GRAFT;  Surgeon: Chuck Hintickson, Christopher S, MD;  [facility];  Service: Vascular;  Laterality: Left;  . REVISON OF ARTERIOVENOUS FISTULA Left 09/11/2013   Procedure: REVISON OF ARTERIOVENOUS FISTULA-LEFT ARM; POSSIBLE VENOPLASTY WITH INTEROPERATIVE FISTULOGRAM;  Surgeon: Chuck Hinthristopher S Dickson, MD;   [facility];  Service: Vascular;  Laterality: Left;       Home Medications    Prior to Admission medications   Medication Sig Start Date End Date Taking? Authorizing Provider  allopurinol (ZYLOPRIM) 100 MG tablet Take 100 mg by mouth every evening.     [provider]  aspirin EC 81 MG tablet Take 81 mg by mouth every evening.     [provider]  Aspirin-Salicylamide-Caffeine (BC FAST PAIN RELIEF) 650-195-33.3 MG PACK Take 1 Package by mouth 2 (two) times daily as needed (for pain.).    [provider]  cinacalcet (SENSIPAR) 30 MG tablet Take 60 mg by mouth every evening.     [provider]  doxycycline (VIBRAMYCIN) 100 MG capsule Take 1 capsule (100 mg total) by mouth 2 (two) times daily. 12/23/16   Eber HongMiller, Vertis Bauder, MD  HYDROcodone-acetaminophen (NORCO/VICODIN) 5-325 MG tablet Take 2 tablets by mouth every 4 (four) hours as needed. 12/23/16   Eber HongMiller, Hussam Muniz, MD  ipratropium (ATROVENT) 0.06 % nasal spray Place 1 spray into both nostrils at bedtime.  09/09/13   [provider]  labetalol (NORMODYNE) 200 MG tablet Take 200 mg by mouth every evening.  11/15/14   [provider]  meloxicam (MOBIC) 7.5 MG tablet Take 7.5 mg by  mouth every evening. 09/24/16   [provider]  multivitamin (RENA-VIT) TABS tablet Take 1 tablet by mouth every evening.     [provider]  nitroGLYCERIN (NITROSTAT) 0.4 MG SL tablet Place 0.4 mg under the tongue every 5 (five) minutes x 3 doses as needed for chest pain. 10/10/16   [provider]  oxyCODONE-acetaminophen (PERCOCET/ROXICET) 5-325 MG tablet Take 1 tablet by mouth every 6 (six) hours as needed for severe pain. 11/16/16   Raymond Gurney, PA-C  traMADol (ULTRAM) 50 MG tablet Take 50 mg by mouth every 8 (eight) hours as needed for moderate pain.    [provider]  vitamin B-12 (CYANOCOBALAMIN) 250 MCG tablet Take 250 mcg by mouth every evening.     [provider]      Family History Family History  Problem Relation Age of Onset  . Cancer Father   . Diabetes Sister     Social History Social History  Substance Use Topics  . Smoking status: Never Smoker  . Smokeless tobacco: Current User    Types: Snuff  . Alcohol use No     Allergies   Iohexol and Zanaflex [tizanidine]   Review of Systems Review of Systems  All other systems reviewed and are negative.    Physical Exam Updated Vital Signs BP (!) 155/63   Pulse 66   Temp 97.6 F (36.4 C) (Oral)   Resp 13   Ht 5\' 10"  (1.778 m)   Wt 109.8 kg (242 lb)   SpO2 98%   BMI 34.72 kg/m   Physical Exam  Constitutional: He appears well-developed and well-nourished. No distress.  HENT:  Head: Normocephalic and atraumatic.  Mouth/Throat: Oropharynx is clear and moist. No oropharyngeal exudate.  Eyes: Pupils are equal, round, and reactive to light. Conjunctivae and EOM are normal. Right eye exhibits no discharge. Left eye exhibits no discharge. No scleral icterus.  Neck: Normal range of motion. Neck supple. No JVD present. No thyromegaly present.  Cardiovascular: Normal rate, regular rhythm, normal heart sounds and intact distal pulses.  Exam reveals no gallop and no friction rub.   No murmur heard. Pulmonary/Chest: Effort normal and breath sounds normal. No respiratory distress. He has no wheezes. He has no rales.  Abdominal: Soft. Bowel sounds are normal. He exhibits no distension and no mass. There is no tenderness.  Musculoskeletal: Normal range of motion. He exhibits edema ( symmetrical chronic lower extremity edema) and tenderness ( The patient has multiple toes with intertriginous mild d/c and skin breakdown - no foul smell).  Pitting edema bilaterally, has a good thrill in the fistula  Lymphadenopathy:    He has no cervical adenopathy.  Neurological: He is alert. Coordination normal.  Skin: Skin is warm and dry. No rash noted. There is erythema ( between toes of R foot).   Psychiatric: He has a normal mood and affect. His behavior is normal.  Nursing note and vitals reviewed.    ED Treatments / Results  Labs (all labs ordered are listed, but only abnormal results are displayed) Labs Reviewed  CBC WITH DIFFERENTIAL/PLATELET - Abnormal; Notable for the following:       Result Value   RBC 3.62 (*)    Hemoglobin 11.5 (*)    HCT 34.7 (*)    All other components within normal limits  BASIC METABOLIC PANEL - Abnormal; Notable for the following:    Potassium 6.0 (*)    Chloride 92 (*)    BUN 42 (*)  Creatinine, Ser 12.37 (*)    GFR calc non Af Amer 3 (*)    GFR calc Af Amer 4 (*)    Anion gap 17 (*)    All other components within normal limits  BRAIN NATRIURETIC PEPTIDE - Abnormal; Notable for the following:    B Natriuretic Peptide 371.8 (*)    All other components within normal limits  I-STAT CG4 LACTIC ACID, ED  I-STAT CG4 LACTIC ACID, ED    EKG  EKG Interpretation None      The patient is a hyperkalemia consistent with needing dialysis, he does not appear to be fluid overloaded and his lungs though he does have chronic edema of his legs which is essentially unchanged. He has poorly healing ulcers in the intertriginous spaces between the toes of the right foot but no signs of purulence or open sores that appear to be draining. I will image the foot to look for osteomyelitis, he does not have a leukocytosis, he would likely benefit from changing his antibiotic if he is in fact indeed on an antibiotic which we are unsure. I will also give him Kayexalate due to the hyperkalemia, he was told to follow-up on Tuesday for dialysis. The patient is in agreement with this plan. He may also need some pain medicine as he is complaining of pain going up his leg and towards his back. There is no streaking or redness in that area, in fact there is no redness over the foot except for over the selected toes  Xray unremarkable Labs unremarkable Pt informed and in  agreement for d/c  Radiology Dg Foot Complete Right  Result Date: 12/23/2016 CLINICAL DATA:  Right foot pain, swelling and drainage times several days. EXAM: RIGHT FOOT COMPLETE - 3+ VIEW COMPARISON:  CT from 12/19/2016 FINDINGS: There is no evidence of acute fracture or dislocation. Soft tissue swelling of the included ankle and dorsum of the mid and forefoot. Plantar and dorsal calcaneal enthesophytes. No bone destruction to suggest osteomyelitis. Diffuse vascular calcifications are present which can be seen in diabetes. IMPRESSION: Diffuse soft tissue swelling of the included ankle and foot consistent with soft tissue edema or cellulitis. No underlying fracture or evidence of osteomyelitis Electronically Signed   By: Tollie Ethavid  Kwon M.D.   On: 12/23/2016 23:05    Procedures Procedures (including critical care time)  Medications Ordered in ED Medications  doxycycline (VIBRA-TABS) tablet 100 mg (100 mg Oral Given 12/23/16 2215)  HYDROcodone-acetaminophen (NORCO/VICODIN) 5-325 MG per tablet 2 tablet (2 tablets Oral Given 12/23/16 2329)     Initial Impression / Assessment and Plan / ED Course  I have reviewed the triage vital signs and the nursing notes.  Pertinent labs & imaging results that were available during my care of the patient were reviewed by me and considered in my medical decision making (see chart for details).    The patient states that he has been on cephalexin, at this time I will add doxycycline. Pt has normal labs Xray without infection in the soft tissues Pt in agreement with d/c  Final Clinical Impressions(s) / ED Diagnoses   Final diagnoses:  Staph infection    New Prescriptions New Prescriptions   DOXYCYCLINE (VIBRAMYCIN) 100 MG CAPSULE    Take 1 capsule (100 mg total) by mouth 2 (two) times daily.   HYDROCODONE-ACETAMINOPHEN (NORCO/VICODIN) 5-325 MG TABLET    Take 2 tablets by mouth every 4 (four) hours as needed.     Eber HongMiller, Josh Nicolosi, MD 12/23/16 [phone]

## 2016-12-23 NOTE — ED Triage Notes (Signed)
Pt c/o progressive bilateral leg pain and swelling for the past few days, hx of CHF, no cp, sob, nausea or vomiting, some sore on feet toes.

## 2016-12-23 NOTE — Discharge Instructions (Addendum)
Your testing shows no infection in your bones - We will start you on Doxycycline twice daily for 10 days Have your doctor check you in 2 days You may need to see an orthopedic surgery in the next week if no I mprovement ER for severe or worsening pain

## 2016-12-24 NOTE — ED Notes (Signed)
Pt departed in NAD.  

## 2016-12-25 ENCOUNTER — Emergency Department (HOSPITAL_COMMUNITY): Payer: Medicare Other

## 2016-12-25 ENCOUNTER — Encounter (HOSPITAL_COMMUNITY): Payer: Self-pay | Admitting: Emergency Medicine

## 2016-12-25 ENCOUNTER — Inpatient Hospital Stay (HOSPITAL_COMMUNITY)
Admission: EM | Admit: 2016-12-25 | Discharge: 2016-12-29 | DRG: 640 | Disposition: A | Discharge status: Home / Self Care | Payer: Medicare Other | Attending: Internal Medicine | Admitting: Internal Medicine

## 2016-12-25 DIAGNOSIS — M1991 Primary osteoarthritis, unspecified site: Secondary | ICD-10-CM | POA: Diagnosis present

## 2016-12-25 DIAGNOSIS — Z992 Dependence on renal dialysis: Secondary | ICD-10-CM

## 2016-12-25 DIAGNOSIS — Z888 Allergy status to other drugs, medicaments and biological substances status: Secondary | ICD-10-CM

## 2016-12-25 DIAGNOSIS — E11649 Type 2 diabetes mellitus with hypoglycemia without coma: Secondary | ICD-10-CM | POA: Diagnosis not present

## 2016-12-25 DIAGNOSIS — E1152 Type 2 diabetes mellitus with diabetic peripheral angiopathy with gangrene: Secondary | ICD-10-CM | POA: Diagnosis present

## 2016-12-25 DIAGNOSIS — L97519 Non-pressure chronic ulcer of other part of right foot with unspecified severity: Secondary | ICD-10-CM | POA: Diagnosis present

## 2016-12-25 DIAGNOSIS — Z7982 Long term (current) use of aspirin: Secondary | ICD-10-CM

## 2016-12-25 DIAGNOSIS — I2510 Atherosclerotic heart disease of native coronary artery without angina pectoris: Secondary | ICD-10-CM | POA: Diagnosis present

## 2016-12-25 DIAGNOSIS — L03115 Cellulitis of right lower limb: Secondary | ICD-10-CM | POA: Diagnosis present

## 2016-12-25 DIAGNOSIS — D631 Anemia in chronic kidney disease: Secondary | ICD-10-CM | POA: Diagnosis present

## 2016-12-25 DIAGNOSIS — N2581 Secondary hyperparathyroidism of renal origin: Secondary | ICD-10-CM | POA: Diagnosis present

## 2016-12-25 DIAGNOSIS — Z9115 Patient's noncompliance with renal dialysis: Secondary | ICD-10-CM

## 2016-12-25 DIAGNOSIS — M549 Dorsalgia, unspecified: Secondary | ICD-10-CM | POA: Diagnosis present

## 2016-12-25 DIAGNOSIS — Z951 Presence of aortocoronary bypass graft: Secondary | ICD-10-CM

## 2016-12-25 DIAGNOSIS — N186 End stage renal disease: Secondary | ICD-10-CM | POA: Diagnosis present

## 2016-12-25 DIAGNOSIS — I132 Hypertensive heart and chronic kidney disease with heart failure and with stage 5 chronic kidney disease, or end stage renal disease: Secondary | ICD-10-CM | POA: Diagnosis present

## 2016-12-25 DIAGNOSIS — E1122 Type 2 diabetes mellitus with diabetic chronic kidney disease: Secondary | ICD-10-CM | POA: Diagnosis present

## 2016-12-25 DIAGNOSIS — I252 Old myocardial infarction: Secondary | ICD-10-CM

## 2016-12-25 DIAGNOSIS — E875 Hyperkalemia: Principal | ICD-10-CM | POA: Diagnosis present

## 2016-12-25 DIAGNOSIS — G8929 Other chronic pain: Secondary | ICD-10-CM | POA: Diagnosis present

## 2016-12-25 DIAGNOSIS — R531 Weakness: Secondary | ICD-10-CM

## 2016-12-25 DIAGNOSIS — I509 Heart failure, unspecified: Secondary | ICD-10-CM | POA: Diagnosis present

## 2016-12-25 DIAGNOSIS — Z91041 Radiographic dye allergy status: Secondary | ICD-10-CM

## 2016-12-25 DIAGNOSIS — M898X9 Other specified disorders of bone, unspecified site: Secondary | ICD-10-CM | POA: Diagnosis present

## 2016-12-25 DIAGNOSIS — J449 Chronic obstructive pulmonary disease, unspecified: Secondary | ICD-10-CM | POA: Diagnosis present

## 2016-12-25 DIAGNOSIS — Z79899 Other long term (current) drug therapy: Secondary | ICD-10-CM

## 2016-12-25 DIAGNOSIS — E11621 Type 2 diabetes mellitus with foot ulcer: Secondary | ICD-10-CM | POA: Diagnosis present

## 2016-12-25 LAB — CBC
HEMATOCRIT: 31.6 % — AB (ref 39.0–52.0)
HEMOGLOBIN: 10.4 g/dL — AB (ref 13.0–17.0)
MCH: 32.9 pg (ref 26.0–34.0)
MCHC: 32.9 g/dL (ref 30.0–36.0)
MCV: 100 fL (ref 78.0–100.0)
Platelets: 150 10*3/uL (ref 150–400)
RBC: 3.16 MIL/uL — ABNORMAL LOW (ref 4.22–5.81)
RDW: 15.6 % — ABNORMAL HIGH (ref 11.5–15.5)
WBC: 5.8 10*3/uL (ref 4.0–10.5)

## 2016-12-25 LAB — BASIC METABOLIC PANEL
ANION GAP: 22 — AB (ref 5–15)
BUN: 63 mg/dL — ABNORMAL HIGH (ref 6–20)
CHLORIDE: 96 mmol/L — AB (ref 101–111)
CO2: 21 mmol/L — AB (ref 22–32)
Calcium: 9.1 mg/dL (ref 8.9–10.3)
Creatinine, Ser: 16.14 mg/dL — ABNORMAL HIGH (ref 0.61–1.24)
GFR calc non Af Amer: 2 mL/min — ABNORMAL LOW (ref >60)
GFR, EST AFRICAN AMERICAN: 3 mL/min — AB (ref >60)
Glucose, Bld: 79 mg/dL (ref 65–99)
Potassium: 6.9 mmol/L (ref 3.5–5.1)
Sodium: 139 mmol/L (ref 135–145)

## 2016-12-25 LAB — CBG MONITORING, ED: GLUCOSE-CAPILLARY: 73 mg/dL (ref 65–99)

## 2016-12-25 NOTE — ED Provider Notes (Signed)
[facility] Provider Note   CSN: [id number] Arrival date & time: 12/25/16  2232  By signing my name below, I, Ny'Kea Lewis, attest that this documentation has been prepared under the direction and in the presence of Geoffery Lyons, MD. Electronically Signed: Karren Cobble, ED Scribe. 12/25/16. 11:23 PM.  History   Chief Complaint Chief Complaint  Patient presents with  . Weakness   The history is provided by the patient and a relative. No language interpreter was used.    HPI HPI Comments: STEWARD SAMES is a 81 y.o. male with a PMHx of CHF, CAD, DM II, ESRD, MI, and HTN, brought in by ambulance, who presents to the Emergency Department complaining of sudden onset, persistent bilateral lower extremity weakness, that began earlier today. He notes associated lower back discomfort. Per pt's relative today while he was standing in the kitchen, he became weak and loss his balance and had to require there assistance to help he stand. Pt reports he is normally ambulatory with an unsteady gait. He denies loss of consciousness or head injury. Pt states today he did not go to dialysis due to the discomfort in his back.  His last full dialysis treatment was 8/4. Dr. Hyman Hopes is his nephrologist. He denies blood in stool, diarrhea, vomiting, shortness of breath, or fever.   Past Medical History:  Diagnosis Date  . Anginal pain (HCC)    before CABG  . Arthritis   . CHF (congestive heart failure) (HCC)   . Constipation   . Coronary artery disease   . Diabetes mellitus   . ESRD (end stage renal disease) (HCC)    TuesThurs Sat [place]  . Hypertension   . Myocardial infarction (HCC)   . Pneumonia    2 years ago    Patient Active Problem List   Diagnosis Date Noted  . End stage renal disease (HCC) 09/09/2013    Past Surgical History:  Procedure Laterality Date  . A/V FISTULAGRAM Left 10/31/2016   Procedure: A/V Fistulagram;  Surgeon: Maeola Harman, MD;  Location: [facility] INVASIVE  CV LAB;  Service: Cardiovascular;  Laterality: Left;  . ARTERIOVENOUS GRAFT PLACEMENT Left   . BACK SURGERY    . CORONARY ARTERY BYPASS GRAFT    . LAPAROSCOPIC COLON RESECTION     growth  . REVISION OF ARTERIOVENOUS GORETEX GRAFT Left 11/16/2016   Procedure: REVISION OF ARTERIOVENOUS GORETEX GRAFT;  Surgeon: Chuck Hint, MD;  Location: [facility] OR;  Service: Vascular;  Laterality: Left;  . REVISON OF ARTERIOVENOUS FISTULA Left 09/11/2013   Procedure: REVISON OF ARTERIOVENOUS FISTULA-LEFT ARM; POSSIBLE VENOPLASTY WITH INTEROPERATIVE FISTULOGRAM;  Surgeon: Chuck Hint, MD;  [facility];  Service: Vascular;  Laterality: Left;       Home Medications    Prior to Admission medications   Medication Sig Start Date End Date Taking? Authorizing Provider  allopurinol (ZYLOPRIM) 100 MG tablet Take 100 mg by mouth every evening.     [provider]  aspirin EC 81 MG tablet Take 81 mg by mouth every evening.     [provider]  Aspirin-Salicylamide-Caffeine (BC FAST PAIN RELIEF) 650-195-33.3 MG PACK Take 1 Package by mouth 2 (two) times daily as needed (for pain.).    [provider]  cinacalcet (SENSIPAR) 30 MG tablet Take 60 mg by mouth every evening.     [provider]  doxycycline (VIBRAMYCIN) 100 MG capsule Take 1 capsule (100 mg total) by mouth 2 (two) times daily. 12/23/16   Hyacinth Meeker,  Arlys John, MD  HYDROcodone-acetaminophen (NORCO/VICODIN) 5-325 MG tablet Take 2 tablets by mouth every 4 (four) hours as needed. 12/23/16   Eber Hong, MD  ipratropium (ATROVENT) 0.06 % nasal spray Place 1 spray into both nostrils at bedtime.  09/09/13   [provider]  labetalol (NORMODYNE) 200 MG tablet Take 200 mg by mouth every evening.  11/15/14   [provider]  meloxicam (MOBIC) 7.5 MG tablet Take 7.5 mg by mouth every evening. 09/24/16   [provider]  multivitamin (RENA-VIT) TABS tablet Take 1 tablet by mouth every evening.      [provider]  nitroGLYCERIN (NITROSTAT) 0.4 MG SL tablet Place 0.4 mg under the tongue every 5 (five) minutes x 3 doses as needed for chest pain. 10/10/16   [provider]  oxyCODONE-acetaminophen (PERCOCET/ROXICET) 5-325 MG tablet Take 1 tablet by mouth every 6 (six) hours as needed for severe pain. 11/16/16   Raymond Gurney, PA-C  traMADol (ULTRAM) 50 MG tablet Take 50 mg by mouth every 8 (eight) hours as needed for moderate pain.    [provider]  vitamin B-12 (CYANOCOBALAMIN) 250 MCG tablet Take 250 mcg by mouth every evening.     [provider]    Family History Family History  Problem Relation Age of Onset  . Cancer Father   . Diabetes Sister     Social History Social History  Substance Use Topics  . Smoking status: Never Smoker  . Smokeless tobacco: Current User    Types: Snuff  . Alcohol use No     Allergies   Iohexol and Zanaflex [tizanidine]   Review of Systems Review of Systems  Constitutional: Negative for fever.  Respiratory: Negative for shortness of breath.   Gastrointestinal: Negative for blood in stool, diarrhea and vomiting.  Musculoskeletal: Positive for back pain.  Neurological: Positive for weakness.  All other systems reviewed and are negative.   Physical Exam Updated Vital Signs BP (!) 144/77 (BP Location: Right Arm)   Pulse 79   Temp 98.2 F (36.8 C) (Oral)   Resp 20   Ht 5\' 10"  (1.778 m)   Wt 240 lb (108.9 kg)   SpO2 92%   BMI 34.44 kg/m   Physical Exam  Constitutional: He is oriented to person, place, and time. He appears well-developed and well-nourished.  Chronically ill appearing elderly male.   HENT:  Head: Normocephalic and atraumatic.  Eyes: EOM are normal.  Neck: Normal range of motion.  Cardiovascular: Normal rate, regular rhythm, normal heart sounds and intact distal pulses.   Pulmonary/Chest: Effort normal and breath sounds normal. No respiratory distress.  Abdominal: Soft. He  exhibits no distension. There is no tenderness.  Musculoskeletal: Normal range of motion.  2-3+ BLE piting edema. There is some break down of the soft tissues between the toes of the right foot. No significant warmth or erthyema of the foot.   Neurological: He is alert and oriented to person, place, and time.  Skin: Skin is warm and dry.  Psychiatric: He has a normal mood and affect. Judgment normal.  Nursing note and vitals reviewed.  ED Treatments / Results  DIAGNOSTIC STUDIES: Oxygen Saturation is 92% on RA, low by my interpretation.   COORDINATION OF CARE: 11:12 PM-Discussed next steps with pt. Pt verbalized understanding and is agreeable with the plan.   Labs (all labs ordered are listed, but only abnormal results are displayed) Labs Reviewed  BASIC METABOLIC PANEL  CBC  URINALYSIS, ROUTINE W REFLEX MICROSCOPIC  CBG MONITORING, ED    EKG  EKG Interpretation  Date/Time:  Tuesday December 25 2016 22:43:27 EDT Ventricular Rate:  73 PR Interval:    QRS Duration: 99 QT Interval:  394 QTC Calculation: 435 R Axis:   21 Text Interpretation:  Sinus rhythm Abnormal T, consider ischemia, lateral leads Baseline wander in lead(s) II III aVF V6 Confirmed by Geoffery LyonseLo, Olin Gurski ([id number]) on 12/25/2016 11:01:31 PM       Radiology No results found.  Procedures Procedures (including critical care time)  Medications Ordered in ED Medications - No data to display   Initial Impression / Assessment and Plan / ED Course  I have reviewed the triage vital signs and the nursing notes.  Pertinent labs & imaging results that were available during my care of the patient were reviewed by me and considered in my medical decision making (see chart for details).  Patient presents with complaints of weakness in his legs. He felt poorly today and missed dialysis. He was seen 2 days ago with similar complaints and was found to have a potassium of 6. He was advised to go to dialysis as scheduled, however  this did not happen. His workup today reveals a potassium of 6.9 with no obvious EKG changes. He was given calcium and glucose and insulin, followed by Kayexalate.  Patient was discussed with Dr. Arrie Aranoladonato from Nephrology. He wants patient admitted to the hospitalist service for dialysis in the morning.  CRITICAL CARE Performed by: Geoffery LyonseLo, Hayzlee Mcsorley Total critical care time: 30 minutes Critical care time was exclusive of separately billable procedures and treating other patients. Critical care was necessary to treat or prevent imminent or life-threatening deterioration. Critical care was time spent personally by me on the following activities: development of treatment plan with patient and/or surrogate as well as nursing, discussions with consultants, evaluation of patient's response to treatment, examination of patient, obtaining history from patient or surrogate, ordering and performing treatments and interventions, ordering and review of laboratory studies, ordering and review of radiographic studies, pulse oximetry and re-evaluation of patient's condition.   Final Clinical Impressions(s) / ED Diagnoses   Final diagnoses:  None    New Prescriptions New Prescriptions   No medications on file  I personally performed the services described in this documentation, which was scribed in my presence. The recorded information has been reviewed and is accurate.        Geoffery Lyonselo, Lashuna Tamashiro, MD 12/26/16 [phone]

## 2016-12-25 NOTE — ED Notes (Signed)
EDP aware pt sugar 73, encouraged juice

## 2016-12-25 NOTE — ED Triage Notes (Signed)
BIB [facility] from home, pt called out for gen weakness, pt missed dialysis today d/t weakness. Per family pt became so weak he collapsed and they caught him, no LOC, did not hit head. Pt recently seen 8/5 for staph infection to RLE.

## 2016-12-26 ENCOUNTER — Inpatient Hospital Stay (HOSPITAL_COMMUNITY): Admit: 2016-12-26 | Payer: Medicare Other

## 2016-12-26 DIAGNOSIS — R531 Weakness: Secondary | ICD-10-CM | POA: Diagnosis not present

## 2016-12-26 DIAGNOSIS — N2581 Secondary hyperparathyroidism of renal origin: Secondary | ICD-10-CM | POA: Diagnosis present

## 2016-12-26 DIAGNOSIS — E1152 Type 2 diabetes mellitus with diabetic peripheral angiopathy with gangrene: Secondary | ICD-10-CM | POA: Diagnosis present

## 2016-12-26 DIAGNOSIS — N186 End stage renal disease: Secondary | ICD-10-CM | POA: Diagnosis not present

## 2016-12-26 DIAGNOSIS — I120 Hypertensive chronic kidney disease with stage 5 chronic kidney disease or end stage renal disease: Secondary | ICD-10-CM | POA: Diagnosis not present

## 2016-12-26 DIAGNOSIS — J449 Chronic obstructive pulmonary disease, unspecified: Secondary | ICD-10-CM | POA: Diagnosis present

## 2016-12-26 DIAGNOSIS — I2510 Atherosclerotic heart disease of native coronary artery without angina pectoris: Secondary | ICD-10-CM | POA: Diagnosis present

## 2016-12-26 DIAGNOSIS — L03115 Cellulitis of right lower limb: Secondary | ICD-10-CM | POA: Diagnosis not present

## 2016-12-26 DIAGNOSIS — M549 Dorsalgia, unspecified: Secondary | ICD-10-CM | POA: Diagnosis present

## 2016-12-26 DIAGNOSIS — I132 Hypertensive heart and chronic kidney disease with heart failure and with stage 5 chronic kidney disease, or end stage renal disease: Secondary | ICD-10-CM | POA: Diagnosis present

## 2016-12-26 DIAGNOSIS — Z992 Dependence on renal dialysis: Secondary | ICD-10-CM | POA: Diagnosis not present

## 2016-12-26 DIAGNOSIS — Z91041 Radiographic dye allergy status: Secondary | ICD-10-CM | POA: Diagnosis not present

## 2016-12-26 DIAGNOSIS — I998 Other disorder of circulatory system: Secondary | ICD-10-CM | POA: Diagnosis not present

## 2016-12-26 DIAGNOSIS — E1122 Type 2 diabetes mellitus with diabetic chronic kidney disease: Secondary | ICD-10-CM | POA: Diagnosis present

## 2016-12-26 DIAGNOSIS — M1991 Primary osteoarthritis, unspecified site: Secondary | ICD-10-CM | POA: Diagnosis present

## 2016-12-26 DIAGNOSIS — L97519 Non-pressure chronic ulcer of other part of right foot with unspecified severity: Secondary | ICD-10-CM

## 2016-12-26 DIAGNOSIS — Z888 Allergy status to other drugs, medicaments and biological substances status: Secondary | ICD-10-CM | POA: Diagnosis not present

## 2016-12-26 DIAGNOSIS — I252 Old myocardial infarction: Secondary | ICD-10-CM | POA: Diagnosis not present

## 2016-12-26 DIAGNOSIS — D631 Anemia in chronic kidney disease: Secondary | ICD-10-CM | POA: Diagnosis not present

## 2016-12-26 DIAGNOSIS — I509 Heart failure, unspecified: Secondary | ICD-10-CM | POA: Diagnosis present

## 2016-12-26 DIAGNOSIS — Z9115 Patient's noncompliance with renal dialysis: Secondary | ICD-10-CM

## 2016-12-26 DIAGNOSIS — Z79899 Other long term (current) drug therapy: Secondary | ICD-10-CM | POA: Diagnosis not present

## 2016-12-26 DIAGNOSIS — E11649 Type 2 diabetes mellitus with hypoglycemia without coma: Secondary | ICD-10-CM | POA: Diagnosis not present

## 2016-12-26 DIAGNOSIS — E11621 Type 2 diabetes mellitus with foot ulcer: Secondary | ICD-10-CM | POA: Diagnosis not present

## 2016-12-26 DIAGNOSIS — Z7982 Long term (current) use of aspirin: Secondary | ICD-10-CM | POA: Diagnosis not present

## 2016-12-26 DIAGNOSIS — E875 Hyperkalemia: Secondary | ICD-10-CM | POA: Diagnosis not present

## 2016-12-26 DIAGNOSIS — Z951 Presence of aortocoronary bypass graft: Secondary | ICD-10-CM | POA: Diagnosis not present

## 2016-12-26 DIAGNOSIS — I70235 Atherosclerosis of native arteries of right leg with ulceration of other part of foot: Secondary | ICD-10-CM | POA: Diagnosis not present

## 2016-12-26 LAB — GLUCOSE, CAPILLARY
GLUCOSE-CAPILLARY: 103 mg/dL — AB (ref 65–99)
GLUCOSE-CAPILLARY: 62 mg/dL — AB (ref 65–99)
Glucose-Capillary: 116 mg/dL — ABNORMAL HIGH (ref 65–99)
Glucose-Capillary: 112 mg/dL — ABNORMAL HIGH (ref 65–99)

## 2016-12-26 LAB — HEMOGLOBIN A1C
Hgb A1c MFr Bld: 5.9 % — ABNORMAL HIGH (ref 4.8–5.6)
Mean Plasma Glucose: 122.6 mg/dL

## 2016-12-26 LAB — URINALYSIS, ROUTINE W REFLEX MICROSCOPIC
Bilirubin Urine: NEGATIVE
GLUCOSE, UA: NEGATIVE mg/dL
Ketones, ur: NEGATIVE mg/dL
NITRITE: NEGATIVE
PH: 8 (ref 5.0–8.0)
Protein, ur: 100 mg/dL — AB
SPECIFIC GRAVITY, URINE: 1.016 (ref 1.005–1.030)

## 2016-12-26 LAB — CBG MONITORING, ED
GLUCOSE-CAPILLARY: 31 mg/dL — AB (ref 65–99)
Glucose-Capillary: 75 mg/dL (ref 65–99)
Glucose-Capillary: 122 mg/dL — ABNORMAL HIGH (ref 65–99)
Glucose-Capillary: 80 mg/dL (ref 65–99)
Glucose-Capillary: 85 mg/dL (ref 65–99)
Glucose-Capillary: 119 mg/dL — ABNORMAL HIGH (ref 65–99)

## 2016-12-26 LAB — HIV ANTIBODY (ROUTINE TESTING W REFLEX): HIV SCREEN 4TH GENERATION: NONREACTIVE

## 2016-12-26 LAB — POTASSIUM: POTASSIUM: 5.7 mmol/L — AB (ref 3.5–5.1)

## 2016-12-26 LAB — C-REACTIVE PROTEIN: CRP: <0.8 mg/dL

## 2016-12-26 LAB — SEDIMENTATION RATE: Sed Rate: 56 mm/hr — ABNORMAL HIGH (ref 0–16)

## 2016-12-26 MED ORDER — VANCOMYCIN HCL IN DEXTROSE 1-5 GM/200ML-% IV SOLN
INTRAVENOUS | Status: AC
  Administered 2016-12-26: 1000 mg via INTRAVENOUS
  Filled 2016-12-26: qty 200

## 2016-12-26 MED ORDER — HEPARIN SODIUM (PORCINE) 1000 UNIT/ML DIALYSIS
7000.0000 [IU] | Freq: Once | INTRAMUSCULAR | Status: AC
  Administered 2016-12-26: 7000 [IU] via INTRAVENOUS_CENTRAL

## 2016-12-26 MED ORDER — DOXYCYCLINE HYCLATE 100 MG PO CAPS: 100.0000 mg | ORAL_CAPSULE | Freq: Two times a day (BID) | ORAL | Status: DC

## 2016-12-26 MED ORDER — METRONIDAZOLE 500 MG PO TABS
500.0000 mg | ORAL_TABLET | Freq: Three times a day (TID) | ORAL | Status: DC
  Administered 2016-12-26 – 2016-12-28 (×9): 500 mg via ORAL
  Filled 2016-12-26 (×8): qty 1

## 2016-12-26 MED ORDER — VANCOMYCIN HCL IN DEXTROSE 1-5 GM/200ML-% IV SOLN
1000.0000 mg | INTRAVENOUS | Status: AC
  Administered 2016-12-26: 1000 mg via INTRAVENOUS

## 2016-12-26 MED ORDER — ASPIRIN EC 81 MG PO TBEC
81.0000 mg | DELAYED_RELEASE_TABLET | Freq: Every evening | ORAL | Status: DC
  Administered 2016-12-26 – 2016-12-29 (×4): 81 mg via ORAL
  Filled 2016-12-26 (×4): qty 1

## 2016-12-26 MED ORDER — ALLOPURINOL 100 MG PO TABS
100.0000 mg | ORAL_TABLET | Freq: Every evening | ORAL | Status: DC
  Administered 2016-12-26 – 2016-12-29 (×4): 100 mg via ORAL
  Filled 2016-12-26 (×4): qty 1

## 2016-12-26 MED ORDER — VITAMIN B-12 1000 MCG PO TABS
1000.0000 ug | ORAL_TABLET | Freq: Every evening | ORAL | Status: DC
  Administered 2016-12-26 – 2016-12-29 (×4): 1000 ug via ORAL
  Filled 2016-12-26 (×4): qty 1

## 2016-12-26 MED ORDER — SODIUM CHLORIDE 0.9 % IV SOLN
2000.0000 mg | Freq: Once | INTRAVENOUS | Status: AC
  Administered 2016-12-26: 2000 mg via INTRAVENOUS
  Filled 2016-12-26: qty 2000

## 2016-12-26 MED ORDER — INSULIN ASPART 100 UNIT/ML IV SOLN
10.0000 [IU] | Freq: Once | INTRAVENOUS | Status: AC
  Administered 2016-12-26: 10 [IU] via INTRAVENOUS

## 2016-12-26 MED ORDER — PENTAFLUOROPROP-TETRAFLUOROETH EX AERO: 1.0000 "application " | INHALATION_SPRAY | CUTANEOUS | Status: DC | PRN

## 2016-12-26 MED ORDER — ONDANSETRON HCL 4 MG/2ML IJ SOLN: 4.0000 mg | Freq: Four times a day (QID) | INTRAMUSCULAR | Status: DC | PRN

## 2016-12-26 MED ORDER — IPRATROPIUM BROMIDE 0.06 % NA SOLN
1.0000 | Freq: Every day | NASAL | Status: DC
  Filled 2016-12-26: qty 15

## 2016-12-26 MED ORDER — HEPARIN SODIUM (PORCINE) 5000 UNIT/ML IJ SOLN
5000.0000 [IU] | Freq: Three times a day (TID) | INTRAMUSCULAR | Status: DC
  Administered 2016-12-26 – 2016-12-28 (×7): 5000 [IU] via SUBCUTANEOUS
  Filled 2016-12-26 (×6): qty 1

## 2016-12-26 MED ORDER — SODIUM CHLORIDE 0.9 % IV SOLN: 100.0000 mL | INTRAVENOUS | Status: DC | PRN

## 2016-12-26 MED ORDER — TRAMADOL HCL 50 MG PO TABS
50.0000 mg | ORAL_TABLET | Freq: Three times a day (TID) | ORAL | Status: DC | PRN
  Administered 2016-12-26 – 2016-12-28 (×3): 50 mg via ORAL
  Filled 2016-12-26 (×3): qty 1

## 2016-12-26 MED ORDER — LIDOCAINE-PRILOCAINE 2.5-2.5 % EX CREA: 1.0000 "application " | TOPICAL_CREAM | CUTANEOUS | Status: DC | PRN

## 2016-12-26 MED ORDER — DEXTROSE 50 % IV SOLN: 12.5000 g | INTRAVENOUS | Status: DC

## 2016-12-26 MED ORDER — DEXTROSE 50 % IV SOLN
50.0000 mL | INTRAVENOUS | Status: AC
Start: 2016-12-26 — End: 2016-12-26
  Administered 2016-12-26: 50 mL via INTRAVENOUS

## 2016-12-26 MED ORDER — ACETAMINOPHEN 650 MG RE SUPP: 650.0000 mg | Freq: Four times a day (QID) | RECTAL | Status: DC | PRN

## 2016-12-26 MED ORDER — MECLIZINE HCL 25 MG PO TABS: 25.0000 mg | ORAL_TABLET | Freq: Every day | ORAL | Status: DC

## 2016-12-26 MED ORDER — HEPARIN SODIUM (PORCINE) 1000 UNIT/ML DIALYSIS: 1000.0000 [IU] | INTRAMUSCULAR | Status: DC | PRN

## 2016-12-26 MED ORDER — NITROGLYCERIN 0.4 MG SL SUBL: 0.4000 mg | SUBLINGUAL_TABLET | SUBLINGUAL | Status: DC | PRN

## 2016-12-26 MED ORDER — DEXTROSE 50 % IV SOLN
INTRAVENOUS | Status: AC
  Filled 2016-12-26: qty 50

## 2016-12-26 MED ORDER — ACETAMINOPHEN 325 MG PO TABS
650.0000 mg | ORAL_TABLET | Freq: Four times a day (QID) | ORAL | Status: DC | PRN
  Administered 2016-12-26 – 2016-12-28 (×3): 650 mg via ORAL
  Filled 2016-12-26 (×2): qty 2

## 2016-12-26 MED ORDER — ALTEPLASE 2 MG IJ SOLR: 2.0000 mg | Freq: Once | INTRAMUSCULAR | Status: DC | PRN

## 2016-12-26 MED ORDER — CEPHALEXIN 250 MG PO CAPS: 500.0000 mg | ORAL_CAPSULE | Freq: Three times a day (TID) | ORAL | Status: DC

## 2016-12-26 MED ORDER — DEXTROSE 50 % IV SOLN
50.0000 mL | Freq: Once | INTRAVENOUS | Status: AC
  Administered 2016-12-26: 50 mL via INTRAVENOUS
  Filled 2016-12-26: qty 50

## 2016-12-26 MED ORDER — RENA-VITE PO TABS
1.0000 | ORAL_TABLET | Freq: Every day | ORAL | Status: DC
  Administered 2016-12-26 – 2016-12-28 (×3): 1 via ORAL
  Filled 2016-12-26 (×3): qty 1

## 2016-12-26 MED ORDER — DEXTROSE 5 % IV SOLN
2.0000 g | Freq: Every day | INTRAVENOUS | Status: DC
  Administered 2016-12-26 – 2016-12-28 (×3): 2 g via INTRAVENOUS
  Filled 2016-12-26 (×5): qty 2

## 2016-12-26 MED ORDER — SODIUM POLYSTYRENE SULFONATE 15 GM/60ML PO SUSP
30.0000 g | Freq: Once | ORAL | Status: AC
  Administered 2016-12-26: 30 g via ORAL
  Filled 2016-12-26: qty 120

## 2016-12-26 MED ORDER — HYDROCODONE-ACETAMINOPHEN 5-325 MG PO TABS: 2.0000 | ORAL_TABLET | ORAL | Status: DC | PRN

## 2016-12-26 MED ORDER — LIDOCAINE HCL (PF) 1 % IJ SOLN: 5.0000 mL | INTRAMUSCULAR | Status: DC | PRN

## 2016-12-26 MED ORDER — ONDANSETRON HCL 4 MG PO TABS: 4.0000 mg | ORAL_TABLET | Freq: Four times a day (QID) | ORAL | Status: DC | PRN

## 2016-12-26 MED ORDER — SODIUM CHLORIDE 0.9 % IV SOLN
1.0000 g | Freq: Once | INTRAVENOUS | Status: AC
  Administered 2016-12-26: 1 g via INTRAVENOUS
  Filled 2016-12-26: qty 10

## 2016-12-26 MED ORDER — OXYCODONE-ACETAMINOPHEN 5-325 MG PO TABS
ORAL_TABLET | ORAL | Status: AC
  Administered 2016-12-26: 1 via ORAL
  Filled 2016-12-26: qty 1

## 2016-12-26 MED ORDER — VANCOMYCIN HCL IN DEXTROSE 1-5 GM/200ML-% IV SOLN
1000.0000 mg | INTRAVENOUS | Status: DC
  Administered 2016-12-28 – 2016-12-29 (×2): 1000 mg via INTRAVENOUS
  Filled 2016-12-26 (×2): qty 200

## 2016-12-26 MED ORDER — OXYCODONE-ACETAMINOPHEN 5-325 MG PO TABS
1.0000 | ORAL_TABLET | Freq: Four times a day (QID) | ORAL | Status: DC | PRN
  Administered 2016-12-26 – 2016-12-29 (×7): 1 via ORAL
  Filled 2016-12-26 (×4): qty 1

## 2016-12-26 MED ORDER — NEPRO/CARBSTEADY PO LIQD
237.0000 mL | Freq: Two times a day (BID) | ORAL | Status: DC
  Administered 2016-12-27 – 2016-12-29 (×5): 237 mL via ORAL

## 2016-12-26 NOTE — Progress Notes (Addendum)
Pharmacy Antibiotic Note  [name] is a 81 y.o. male admitted on 12/25/2016 with weakness and suspected diabetic foot infection. Pharmacy has been consulted for vancomycin dosing. Patient is also receiving ceftriaxone and flagyl per the lower extremity wound protocol.   Patient is ESRD on HD TTS with last HD on Saturday. WBC within normal limits and patient is afebrile.   Plan: Vancomycin 2g IV x1, then 1g IV with HD on TuThSat Monitor culture data and clinical picture Vancomycin trough at steady state and PRN (goal 15-20 mcg/mL)  Height: 5\' 10"  (177.8 cm) Weight: 240 lb (108.9 kg) IBW/kg (Calculated) : 73  Temp (24hrs), Avg:98.2 F (36.8 C), Min:98.2 F (36.8 C), Max:98.2 F (36.8 C)   Recent Labs Lab 12/23/16 2004 12/23/16 2018 12/25/16 2325  WBC 6.1  --  5.8  CREATININE 12.37*  --  16.14*  LATICACIDVEN  --  1.21  --     Estimated Creatinine Clearance: 4.6 mL/min (A) (by C-G formula based on SCr of 16.14 mg/dL (H)).    Allergies  Allergen Reactions  . Iohexol Swelling and Rash  . Zanaflex [Tizanidine] Other (See Comments)    Shut kidneys down Unable to walk or void    Antimicrobials this admission: 8/8 Vanc >>  8/8 CTX >> 8/8 Flagyl >>   Dose adjustments this admission: n/a  Microbiology results: pending   York CeriseKatherine Cook, PharmD Clinical Pharmacist 12/26/16 4:59 AM

## 2016-12-26 NOTE — Progress Notes (Signed)
Pharmacy Antibiotic Note  [name] is a 81 y.o. male admitted on 12/25/2016 with weakness and suspected diabetic foot infection. Pharmacy has been consulted for vancomycin dosing. Patient is also receiving ceftriaxone and flagyl per the lower extremity wound protocol.   Patient is ESRD on HD TTS with last HD on Saturday. WBC within normal limits and patient is afebrile.   Patient currently in HD off schedule, tolerating well so far.  Plan: Due to extra HD today - will give vancomycin 1g x 1 at end of session. Monitor culture data and clinical picture Vancomycin trough at steady state and PRN (goal 15-20 mcg/mL) F/u HD schedule.  Height: 5\' 10"  (177.8 cm) Weight: 243 lb 9.7 oz (110.5 kg) IBW/kg (Calculated) : 73  Temp (24hrs), Avg:98.2 F (36.8 C), Min:98 F (36.7 C), Max:98.5 F (36.9 C)   Recent Labs Lab 12/23/16 2004 12/23/16 2018 12/25/16 2325  WBC 6.1  --  5.8  CREATININE 12.37*  --  16.14*  LATICACIDVEN  --  1.21  --     Estimated Creatinine Clearance: 4.6 mL/min (A) (by C-G formula based on SCr of 16.14 mg/dL (H)).    Allergies  Allergen Reactions  . Iohexol Swelling and Rash  . Zanaflex [Tizanidine] Other (See Comments)    Shut kidneys down Unable to walk or void    Antimicrobials this admission: 8/8 Vanc >>  8/8 CTX >> 8/8 Flagyl >>   Dose adjustments this admission: n/a  Microbiology results:  8/8 BCx x 2: *  UCx: *  Sputum: *   MRSA PCR: *  Ian MooreJessica Mindi Turner, Pharm D, BCPS  Clinical Pharmacist Pager [phone]  12/26/2016 10:28 AM

## 2016-12-26 NOTE — Progress Notes (Signed)
Inpatient Diabetes Program Recommendations  AACE/ADA: New Consensus Statement on Inpatient Glycemic Control (2015)  Target Ranges:  Prepandial:   less than 140 mg/dL      Peak postprandial:   less than 180 mg/dL (1-2 hours)      Critically ill patients:  140 - 180 mg/dL   Results for [name], [name] (MRN [id number]) as of 12/26/2016 09:47  Ref. Range 12/25/2016 23:15 12/26/2016 01:16 12/26/2016 03:57 12/26/2016 04:15 12/26/2016 05:43 12/26/2016 06:55 12/26/2016 08:30  Glucose-Capillary Latest Ref Range: 65 - 99 mg/dL 73 75 31 (LL) 409122 (H) 80 85 119 (H)   Review of Glycemic Control  Diabetes history: DM 2 Outpatient Diabetes medications: Diet controlled Current orders for Inpatient glycemic control: None  Consult for lower extremity  wound  Glucose 119 and below. Patient's A1c level was 5.9%. Patient did have hypoglycemia down to 31 mg/dl due to 10 units of insulin given for hyperkalemia protocol. Will follow while inpatient.  Thanks,  Christena DeemShannon Alanson Hausmann RN, MSN, [facility] Inpatient Diabetes Coordinator Team Pager [phone] (8a-5p)

## 2016-12-26 NOTE — ED Notes (Signed)
Requested urine sample from pt, urinal provided.

## 2016-12-26 NOTE — ED Notes (Signed)
Pt felt cool & clammy to touch, also drowsy. Asked tech to check CBG, noted to be 31. Julian ReilGardner MD at bedside and aware. Order for amp of D50

## 2016-12-26 NOTE — Consult Note (Signed)
WOC Nurse wound consult note Reason for Consult: right foot wound Wound type: drainage noted in the web spaces 2,3, 4, 5th toes, extremely painful to the touch, patient would not allow me to separate to really assess.  It does appear there is some necrotic skin between the 3rd and 4th toe but difficult to assess. Unclear etiology, patient does not endorse injury just pain.  Both LE are edematous 3+ Pressure Injury POA:NA Measurement:unable to measure Wound bed: unable to assess Drainage (amount, consistency, odor) yellow, with no odor Periwound: edema and redness  Dressing procedure/placement/frequency: Silver hydrofiber weave between the affected toes, this will serve as antimicrobial and absorb exudate. Change every other day. Once drainage has improved may be able to assess skin changes between the toes better.]   Discussed POC with patient and bedside nurse.  Re consult if needed, will not follow at this time. Thanks  Lanitra Battaglini M.D.C. [employer] MSN, RN,CWOCN, CNS, CWON-AP [phone])

## 2016-12-26 NOTE — H&P (Signed)
History and Physical    AIDON KLEMENS RUE:[id number] DOB: Jul 11, 1935 DOA: 12/25/2016  PCP: Crist Fat, MD  Patient coming from: Home  I have personally briefly reviewed patient's old medical records in [facility]  Chief Complaint: Weakness, missed dialysis  HPI: [name] is a 81 y.o. male with medical history significant of ESRD dialysis TTS, last session on Saturday, missed dialysis on Tuesday due to back pain.  Normally ambulatory with unsteady gait at baseline.  But had worsening weakness today throughout day.  Nothing makes better or worse.  Symptoms are severe.   ED Course: K is 6.9.  Given kayexelate, insulin, glucose.   Review of Systems: As per HPI otherwise 10 point review of systems negative.   Past Medical History:  Diagnosis Date  . Anginal pain (HCC)    before CABG  . Arthritis   . CHF (congestive heart failure) (HCC)   . Constipation   . Coronary artery disease   . Diabetes mellitus   . ESRD (end stage renal disease) (HCC)    TuesThurs Sat [place]  . Hypertension   . Myocardial infarction (HCC)   . Pneumonia    2 years ago    Past Surgical History:  Procedure Laterality Date  . A/V FISTULAGRAM Left 10/31/2016   Procedure: A/V Fistulagram;  Surgeon: Maeola Harman, MD;  Location[facility] INVASIVE CV LAB;  Service: Cardiovascular;  Laterality: Left;  . ARTERIOVENOUS GRAFT PLACEMENT Left   . BACK SURGERY    . CORONARY ARTERY BYPASS GRAFT    . LAPAROSCOPIC COLON RESECTION     growth  . REVISION OF ARTERIOVENOUS GORETEX GRAFT Left 11/16/2016   Procedure: REVISION OF ARTERIOVENOUS GORETEX GRAFT;  Surgeon: Chuck Hint, MD;  Location: [facility] OR;  Service: Vascular;  Laterality: Left;  . REVISON OF ARTERIOVENOUS FISTULA Left 09/11/2013   Procedure: REVISON OF ARTERIOVENOUS FISTULA-LEFT ARM; POSSIBLE VENOPLASTY WITH INTEROPERATIVE FISTULOGRAM;  Surgeon: Chuck Hint, MD;  Location: [facility] OR;  Service: Vascular;  Laterality: Left;      reports that he has never smoked. His smokeless tobacco use includes Snuff. He reports that he does not drink alcohol or use drugs.  Allergies  Allergen Reactions  . Iohexol Swelling and Rash  . Zanaflex [Tizanidine] Other (See Comments)    Shut kidneys down Unable to walk or void    Family History  Problem Relation Age of Onset  . Cancer Father   . Diabetes Sister      Prior to Admission medications   Medication Sig Start Date End Date Taking? Authorizing Provider  allopurinol (ZYLOPRIM) 100 MG tablet Take 100 mg by mouth every evening.    Yes [provider]  aspirin EC 81 MG tablet Take 81 mg by mouth every evening.    Yes [provider]  cephALEXin (KEFLEX) 500 MG capsule Take 500 mg by mouth every 8 (eight) hours.   Yes [provider]  doxycycline (VIBRAMYCIN) 100 MG capsule Take 1 capsule (100 mg total) by mouth 2 (two) times daily. 12/23/16  Yes Eber Hong, MD  HYDROcodone-acetaminophen (NORCO/VICODIN) 5-325 MG tablet Take 2 tablets by mouth every 4 (four) hours as needed. Patient taking differently: Take 2 tablets by mouth every 4 (four) hours as needed for moderate pain.  12/23/16  Yes Eber Hong, MD  ipratropium (ATROVENT) 0.06 % nasal spray Place 1 spray into both nostrils at bedtime.  09/09/13  Yes [provider]  meclizine (ANTIVERT) 25 MG tablet Take 25 mg  by mouth daily.   Yes [provider]  multivitamin (RENA-VIT) TABS tablet Take 1 tablet by mouth every evening.    Yes [provider]  nitroGLYCERIN (NITROSTAT) 0.4 MG SL tablet Place 0.4 mg under the tongue every 5 (five) minutes x 3 doses as needed for chest pain. 10/10/16  Yes [provider]  oxyCODONE-acetaminophen (PERCOCET/ROXICET) 5-325 MG tablet Take 1 tablet by mouth every 6 (six) hours as needed for severe pain. 11/16/16  Yes Maris Bergerrinh, Kimberly A, PA-C  traMADol (ULTRAM) 50 MG tablet Take 50 mg by mouth every 8 (eight) hours as needed for  moderate pain.   Yes [provider]  vitamin B-12 (CYANOCOBALAMIN) 250 MCG tablet Take 250 mcg by mouth every evening.    Yes [provider]    Physical Exam: Vitals:   12/26/16 0230 12/26/16 0300 12/26/16 0330 12/26/16 0400  BP: (!) 172/43 (!) 153/52 (!) 137/49 (!) 130/48  Pulse: 76 92 78 73  Resp: 16 12 11  (!) 9  Temp:      TempSrc:      SpO2: 99% 95% 95% 97%  Weight:      Height:        Constitutional: NAD, calm, comfortable Eyes: PERRL, lids and conjunctivae normal ENMT: Mucous membranes are moist. Posterior pharynx clear of any exudate or lesions.Normal dentition.  Neck: normal, supple, no masses, no thyromegaly Respiratory: clear to auscultation bilaterally, no wheezing, no crackles. Normal respiratory effort. No accessory muscle use.  Cardiovascular: Regular rate and rhythm, no murmurs / rubs / gallops. No extremity edema. 2+ pedal pulses. No carotid bruits.  Abdomen: no tenderness, no masses palpated. No hepatosplenomegaly. Bowel sounds positive.  Musculoskeletal: no clubbing / cyanosis. No joint deformity upper and lower extremities. Good ROM, no contractures. Normal muscle tone.  Skin: Ulcer and cellulitis of last 2 toes of R foot. Neurologic: CN 2-12 grossly intact. Sensation intact, DTR normal. Strength 5/5 in all 4.  Psychiatric: Normal judgment and insight. Alert and oriented x 3. Normal mood.    Labs on Admission: I have personally reviewed following labs and imaging studies  CBC:  Recent Labs Lab 12/23/16 2004 12/25/16 2325  WBC 6.1 5.8  NEUTROABS 4.4  --   HGB 11.5* 10.4*  HCT 34.7* 31.6*  MCV 95.9 100.0  PLT 165 150   Basic Metabolic Panel:  Recent Labs Lab 12/23/16 2004 12/25/16 2325  NA 137 139  K 6.0* 6.9*  CL 92* 96*  CO2 28 21*  GLUCOSE 97 79  BUN 42* 63*  CREATININE 12.37* 16.14*  CALCIUM 9.6 9.1   GFR: Estimated Creatinine Clearance: 4.6 mL/min (A) (by C-G formula based on SCr of 16.14 mg/dL (H)). Liver  Function Tests: No results for input(s): AST, ALT, ALKPHOS, BILITOT, PROT, ALBUMIN in the last 168 hours. No results for input(s): LIPASE, AMYLASE in the last 168 hours. No results for input(s): AMMONIA in the last 168 hours. Coagulation Profile: No results for input(s): INR, PROTIME in the last 168 hours. Cardiac Enzymes: No results for input(s): CKTOTAL, CKMB, CKMBINDEX, TROPONINI in the last 168 hours. BNP (last 3 results) No results for input(s): PROBNP in the last 8760 hours. HbA1C: No results for input(s): HGBA1C in the last 72 hours. CBG:  Recent Labs Lab 12/25/16 2315 12/26/16 0116 12/26/16 0357  GLUCAP 73 75 31*   Lipid Profile: No results for input(s): CHOL, HDL, LDLCALC, TRIG, CHOLHDL, LDLDIRECT in the last 72 hours. Thyroid Function Tests: No results for input(s): TSH, T4TOTAL, FREET4, T3FREE,  THYROIDAB in the last 72 hours. Anemia Panel: No results for input(s): VITAMINB12, FOLATE, FERRITIN, TIBC, IRON, RETICCTPCT in the last 72 hours. Urine analysis:    Component Value Date/Time   COLORURINE YELLOW 12/26/2016 0024   APPEARANCEUR CLOUDY (A) 12/26/2016 0024   LABSPEC 1.016 12/26/2016 0024   PHURINE 8.0 12/26/2016 0024   GLUCOSEU NEGATIVE 12/26/2016 0024   HGBUR SMALL (A) 12/26/2016 0024   BILIRUBINUR NEGATIVE 12/26/2016 0024   KETONESUR NEGATIVE 12/26/2016 0024   PROTEINUR 100 (A) 12/26/2016 0024   UROBILINOGEN 0.2 03/08/2011 0253   NITRITE NEGATIVE 12/26/2016 0024   LEUKOCYTESUR LARGE (A) 12/26/2016 0024    Radiological Exams on Admission: Dg Chest 2 View  Result Date: 12/26/2016 CLINICAL DATA:  Left-sided chest pain and bilateral lower extremity weakness. EXAM: CHEST  2 VIEW COMPARISON:  04/20/2015 FINDINGS: Stable cardiac enlargement and status post prior CABG. Lung volumes are very low bilaterally with bibasilar atelectasis present. There is no evidence of pulmonary edema, consolidation, pneumothorax, nodule or pleural fluid. Visualized bony structures  are unremarkable. IMPRESSION: Stable cardiomegaly.  Low lung volumes with bibasilar atelectasis. Electronically Signed   By: Irish Lack M.D.   On: 12/26/2016 00:05    EKG: Independently reviewed.  Assessment/Plan Principal Problem:   Noncompliance of patient with renal dialysis [facility]) Active Problems:   End stage renal disease (HCC)   Hyperkalemia   Ulcer of right foot due to type 2 diabetes mellitus (HCC)   Cellulitis of right foot    1. Hyperkalemia due to missed dialysis - did inform patient that missing dialysis and hyperkalemia could have fatal consequences and to avoid this in future. 1. K improved to 5.7 on repeat 2. Tele monitor 3. Dialysis this AM 2. Ulcer and cellulitis of R foot - has failed outpatient keflex and doxycycline now without any improvement per patient 1. Lower extremity wound pathway 2. Will further escalate abx to rocephin / flagyl / vanc as per pathway 3. Arterial duplex 4. Wound care consult 3. COPD - continue home nebs 4. Back pain - continue percocet PRN 5. DM2 - diet controlled at this point  DVT prophylaxis: Heparin Code Status: Full Family Communication: Family at bedside Disposition Plan: Home after admit Consults called: Dr. Arrie Aran called by EDP, plan dialysis in AM. Admission status: Admit to inpatient - suspect he will need several days IV ABx for foot infection which has failed outpatient ABx therapy.   Hillary Bow DO [facility] Pager [phone]  If 7AM-7PM, please contact day team taking care of patient [email or web address] Password [email or web address]  12/26/2016, 4:12 AM

## 2016-12-26 NOTE — Care Management Note (Signed)
Case Management Note  Patient Details  Name: [name] MRN: [id number] Date of Birth: 09/21/1935  Subjective/Objective:                  81 y.o. male from home with spouse with medical history significant of ESRD dialysis TTS, last session on Saturday, missed dialysis on Tuesday due to back pain.  Normally ambulatory with unsteady gait at baseline.  But had worsening weakness today throughout day.  Action/Plan: Admit status INPATIENT; anticipate discharge HOME WITH HOME HEALTH, DME.   Expected Discharge Date:   (unknown)               Expected Discharge Plan:  Home w Home Health Services  In-House Referral:  NA  Discharge planning Services  CM Consult  Post Acute Care Choice:  Durable Medical Equipment, Home Health Choice offered to:     DME Arranged:    DME Agency:     HH Arranged:    HH Agency:     Status of Service:  In process, will continue to follow  If discussed at Long Length of Stay Meetings, dates discussed:    Additional Comments: HD patient, please notify [facility] [phone]) when pt ready for discharge.  [name], Ian Heymann, RN 12/26/2016, 8:15 AM

## 2016-12-26 NOTE — Progress Notes (Signed)
Patient transported to Hemodialysis 

## 2016-12-26 NOTE — ED Notes (Signed)
K 6.9, Dr. Judd Lienelo made aware

## 2016-12-26 NOTE — Progress Notes (Signed)
Patient arrived on unit via stretcher from ED. Daughter at bedside.  Telemetry placed per MD and CMT notified.

## 2016-12-26 NOTE — Clinical Social Work Note (Signed)
Clinical Social Work Assessment  Patient Details  Name: [name] MRN: [id number] Date of Birth: 05/02/1936  Date of referral:  12/26/16               Reason for consult:  Medication Concerns                Permission sought to share information with:  Family Supports Permission granted to share information::  Yes, Verbal Permission Granted  Name::        Agency::     Relationship::     Contact Information:     Housing/Transportation Living arrangements for the past 2 months:  Single Family Home (with wife. ) Source of Information:  Patient Patient Interpreter Needed:  None Criminal Activity/Legal Involvement Pertinent to Current Situation/Hospitalization:  No - Comment as needed Significant Relationships:  Adult Children, Spouse Lives with:  Spouse Do you feel safe going back to the place where you live?  Yes Need for family participation in patient care:  Yes (Comment)  Care giving concerns:  CSW spoke with pt and pt's daughter [name] regarding pt's needs at this time. Neither pt or daughter expressed any concerns.    Social Worker assessment / plan:  CSW spoke with pt and pt's daughter at bedside. Pt informed CSW that pt lives with wife Ian Aas([name]) for 37 years. Pt expressed that she as well as pt's daughter and son are able to care for pt when pt is unable to. Pt told CSW that pt does have a great support system from family as well as church members at this time. Pt's plan is to return back to home with wife once medically stable for discharge.   Employment status:  Retired Database administratornsurance information:  Managed Medicare PT Recommendations:  Not assessed at this time Information / Referral to community resources:     Patient/Family's Response to care:  Pt was understanding and agreeable to plan of care at this time. CSW sought feedback for any questions or concerns that pt or daughter had.   Patient/Family's Understanding of and Emotional Response to Diagnosis, Current Treatment, and  Prognosis:  Pt is agreeable and understanding of plan of care at this time. No further questions or concerns were presented.   Emotional Assessment Appearance:  Appears stated age Attitude/Demeanor/Rapport:    Affect (typically observed):  Accepting, Adaptable, Pleasant Orientation:  Oriented to Self, Oriented to Place, Oriented to  Time, Oriented to Situation Alcohol / Substance use:  Tobacco Use (Snuff. ) Psych involvement (Current and /or in the community):  No (Comment)  Discharge Needs  Concerns to be addressed:  No discharge needs identified Readmission within the last 30 days:  No Current discharge risk:  None Barriers to Discharge:  No Barriers Identified   Robb MatarKierra S Treson Laura, LCSWA 12/26/2016, 8:10 AM

## 2016-12-26 NOTE — Procedures (Signed)
BFR 350/ VP 190  I was present at this dialysis session, have reviewed the session itself and made  appropriate changes Vinson Moselleob Alexander Mcauley MD [facility] pager [phone]   12/26/2016, 10:15 AM

## 2016-12-26 NOTE — ED Notes (Signed)
Paged Dr. Jomarie LongsJoseph made aware of BP 184/72. Reports pt needs dialysis and will go this morning. No med orders given.

## 2016-12-26 NOTE — ED Notes (Signed)
cbg was 119 

## 2016-12-26 NOTE — Clinical Social Work Note (Signed)
CSW received a consult regarding "access med for discharge". Case management also consulted and will talk with patient/family regarding medication needs as social work cannot assist with medication. CSW signing off, however please reconsult if any social work needs prior to discharge.  Genelle BalVanessa Shikara Mcauliffe, MSW, LCSW Licensed Clinical Social Worker Clinical Social Work Department [facility] [phone]

## 2016-12-26 NOTE — Consult Note (Signed)
Renal Service Consult Note [facility]  Charlett BlakeWarren J Pulse 12/26/2016 Delano MetzSCHERTZ,Arthella Headings D Requesting Physician:  Dr Jomarie LongsJoseph  Reason for Consult:  ESRD pt w/ foot infection and high K+ HPI: The patient is a 81 y.o. year-old with hx of ESRD on HD since 2009, also HTN, CAD/ MI, CHF, DJD, PNA.  Last admission here with urosepsis w/ klebs pna UTI. Hx CABG, HL, colon resection 2009. Came to ED last night with gen'd weakness, difficulty ambulating, R foot pain.  In ED was found to have high K 6.9 and infection in R foot.  Had been on OP po abx (doxy / keflex) for this.  Admitted, started on IV abx.  Got kayexalate and K down to 5.7.  Asked to see for ESRD.    Pt HOH, daughter and pt provide hx.  No abd pain, no n/v/d, no recent CP.  +fevers/ chills and pain in the toes on R foot.    Has missed HD twice in last 2 weeks.     Home meds: allopurinol/ asa/ atrovent/ renavite/ B12 keflex/ doxycycline prns - norco/ antivert/ SL ntg/ percocet/ ultram/ BC fast pain   ROS  denies CP  no joint pain   no HA  no blurry vision  no rash  no diarrhea  no nausea/ vomiting    Past Medical History  Past Medical History:  Diagnosis Date  . Anginal pain (HCC)    before CABG  . Arthritis   . CHF (congestive heart failure) (HCC)   . Constipation   . Coronary artery disease   . Diabetes mellitus   . ESRD (end stage renal disease) (HCC)    TuesThurs Sat [place]un[place]y  . Hypertension   . Myocardial infarction (HCC)   . Pneumonia    2 years ago   Past Surgical History  Past Surgical History:  Procedure Laterality Date  . A/V FISTULAGRAM Left 10/31/2016   Procedure: A/V Fistulagram;  Surgeon: Maeola Harmanain, Brandon Christopher, MD;  [facility];  Service: Cardiovascular;  Laterality: Left;  . ARTERIOVENOUS GRAFT PLACEMENT Left   . BACK SURGERY    . CORONARY ARTERY BYPASS GRAFT    . LAPAROSCOPIC COLON RESECTION     growth  . REVISION OF ARTERIOVENOUS GORETEX GRAFT Left 11/16/2016   Procedure: REVISION OF ARTERIOVENOUS GORETEX GRAFT;  Surgeon: Chuck Hintickson, Christopher S, MD;  [facility];  Service: Vascular;  Laterality: Left;  . REVISON OF ARTERIOVENOUS FISTULA Left 09/11/2013   Procedure: REVISON OF ARTERIOVENOUS FISTULA-LEFT ARM; POSSIBLE VENOPLASTY WITH INTEROPERATIVE FISTULOGRAM;  Surgeon: Chuck Hinthristopher S Dickson, MD;  [facility];  Service: Vascular;  Laterality: Left;   Family History  Family History  Problem Relation Age of Onset  . Cancer Father   . Diabetes Sister    Social History  reports that he has never smoked. His smokeless tobacco use includes Snuff. He reports that he does not drink alcohol or use drugs. Allergies  Allergies  Allergen Reactions  . Iohexol Swelling and Rash  . Zanaflex [Tizanidine] Other (See Comments)    Shut kidneys down Unable to walk or void   Home medications Prior to Admission medications   Medication Sig Start Date End Date Taking? Authorizing Provider  allopurinol (ZYLOPRIM) 100 MG tablet Take 100 mg by mouth every evening.    Yes [provider]  aspirin EC 81 MG tablet Take 81 mg by mouth every evening.    Yes [provider]  cephALEXin (KEFLEX) 500 MG capsule Take 500 mg by  mouth every 8 (eight) hours.   Yes [provider]  doxycycline (VIBRAMYCIN) 100 MG capsule Take 1 capsule (100 mg total) by mouth 2 (two) times daily. 12/23/16  Yes Eber Hong, MD  HYDROcodone-acetaminophen (NORCO/VICODIN) 5-325 MG tablet Take 2 tablets by mouth every 4 (four) hours as needed. Patient taking differently: Take 2 tablets by mouth every 4 (four) hours as needed for moderate pain.  12/23/16  Yes Eber Hong, MD  ipratropium (ATROVENT) 0.06 % nasal spray Place 1 spray into both nostrils at bedtime.  09/09/13  Yes [provider]  meclizine (ANTIVERT) 25 MG tablet Take 25 mg by mouth daily.   Yes [provider]  multivitamin (RENA-VIT) TABS tablet Take 1 tablet by mouth every evening.    Yes  [provider]  nitroGLYCERIN (NITROSTAT) 0.4 MG SL tablet Place 0.4 mg under the tongue every 5 (five) minutes x 3 doses as needed for chest pain. 10/10/16  Yes [provider]  oxyCODONE-acetaminophen (PERCOCET/ROXICET) 5-325 MG tablet Take 1 tablet by mouth every 6 (six) hours as needed for severe pain. 11/16/16  Yes Maris Berger A, PA-C  traMADol (ULTRAM) 50 MG tablet Take 50 mg by mouth every 8 (eight) hours as needed for moderate pain.   Yes [provider]  vitamin B-12 (CYANOCOBALAMIN) 250 MCG tablet Take 250 mcg by mouth every evening.    Yes [provider]   Liver Function Tests No results for input(s): AST, ALT, ALKPHOS, BILITOT, PROT, ALBUMIN in the last 168 hours. No results for input(s): LIPASE, AMYLASE in the last 168 hours. CBC  Recent Labs Lab 12/23/16 2004 12/25/16 2325  WBC 6.1 5.8  NEUTROABS 4.4  --   HGB 11.5* 10.4*  HCT 34.7* 31.6*  MCV 95.9 100.0  PLT 165 150   Basic Metabolic Panel  Recent Labs Lab 12/23/16 2004 12/25/16 2325 12/26/16 0350  NA 137 139  --   K 6.0* 6.9* 5.7*  CL 92* 96*  --   CO2 28 21*  --   GLUCOSE 97 79  --   BUN 42* 63*  --   CREATININE 12.37* 16.14*  --   CALCIUM 9.6 9.1  --    Iron/TIBC/Ferritin/ %Sat    Component Value Date/Time   IRON 30 (L) 12/01/2007 0700   TIBC 221 12/01/2007 0700   FERRITIN 235 12/01/2007 0700   IRONPCTSAT 14 (L) 12/01/2007 0700    Vitals:   12/26/16 0630 12/26/16 0700 12/26/16 0732 12/26/16 0800  BP: (!) 194/93 (!) 185/56 (!) 195/70 (!) 184/72  Pulse: 84 77 82 82  Resp: 11 (!) 9 16 17   Temp:      TempSrc:      SpO2: 100% 99% 97% 96%  Weight:      Height:       Exam Gen alert, elderly HOH, no distress No rash, cyanosis or gangrene Sclera anicteric, throat clear  No jvd or bruits Chest clear bilat RRR no MRG Abd soft ntnd no mass or ascites +bs GU normal male MS no joint effusions or deformity Ext 1+ bilat LE edema / R foot is erythematous w/  some maceration between toes 3-4-5 Neuro is alert, Ox 3 , nf     Dialysis:  [facility] 4h  107.5kg  2/2 bath  Hep 7000   LFA AVG - parsabiv 2.5 - calc 0.25 ug tiw - no esa    Impression: 1  Hyperkalemia - runs high at OP unit 2  ESRD HD TTS,  missed HD yest and also missed 2 other sessions in last 2 wks 3  CAD hx CABG 4  Volume up 1-2kg, + LE edema 5  HTN bp's up, not on any meds at home 6  Chronic back pain 7  MBD cont meds 8  Anemia of CKD - no esa, Hb > 10    Plan - HD today and again tomorrow.  Pull vol for ^BP and possibly lower edw.   Vinson Moselle MD [employer] pager [phone]   12/26/2016, 8:17 AM

## 2016-12-26 NOTE — Progress Notes (Signed)
PROGRESS NOTE    Ian BlakeWarren J Turner  UJW:[id number] DOB: 04/27/1936 DOA: 12/25/2016 PCP: Crist FatVan Eyk, Jason, MD  Brief Narrative: 81 y.o. year-old with hx of ESRD on HD, HTN, CAD/ CABG, CHF, DJD, PNA, colon resection 2009. Came to ED last night with gen'd weakness, difficulty ambulating, R foot pain.  In ED was found to have high K 6.9 and infection in R foot.  Had been on OP po abx (doxy / keflex) for this.     Assessment & Plan:     Hyperkalemia-6.9  -Due to missed hemodialysis -Given Kayexalate in the emergency room yesterday -Urgent HD today    End stage renal disease (HCC) -On HD as above, renal consulting      Diabetic foot ulcer with cellulitis -Started on vancomycin, ceftriaxone and Flagyl this morning -Follow-up blood cultures -Consult to wound RN -Diminished pulsations in the right will check arterial duplex  Diabetes mellitus -Hemoglobin A1c 5.9 -Not on medications at home -Monitor, hypoglycemia this morning following IV insulin for hyperkalemia-treated    CAD/CABG -Stable, only on aspirin at home  DVT prophylaxis: Heparin subcutaneous Code Status: Full code Family Communication: Granddaughter bedside Disposition Plan: Home in 1-2 days if stable  Consultants:   Renal   wound care RN   Antimicrobials:   Vancomycin/ceftriaxone/Flagyl from 8/7   Subjective: Reports some pain in right foot, denies any other complaints  Objective: Vitals:   12/26/16 1000 12/26/16 1030 12/26/16 1100 12/26/16 1130  BP: (!) 164/60 (!) 156/61 (!) 127/55 (!) 124/42  Pulse: 72 68 75 70  Resp:      Temp:      TempSrc:      SpO2:      Weight:      Height:        Intake/Output Summary (Last 24 hours) at 12/26/16 1150 Last data filed at 12/26/16 0903  Gross per 24 hour  Intake                0 ml  Output                0 ml  Net                0 ml   Filed Weights   12/25/16 2231 12/26/16 0903 12/26/16 0915  Weight: 108.9 kg (240 lb) 109.6 kg (241 lb 10 oz) 110.5 kg (243 lb  9.7 oz)    Examination:  General exam: Appears calm and comfortable, elderly male, no distress Respiratory system: Clear to auscultation. Respiratory effort normal. Cardiovascular system: S1 & S2 heard, RRR Gastrointestinal system: Abdomen is nondistended, soft and nontender.  Normal bowel sounds heard. Central nervous system: Alert and oriented. No focal neurological deficits. Extremities: R foot with erythema, swelling and tenderness over dorsum and ulcers and maceration between 3rd, 4th and 5th toes Psychiatry: Judgement and insight appear normal. Mood & affect appropriate.     Data Reviewed:   CBC:  Recent Labs Lab 12/23/16 2004 12/25/16 2325  WBC 6.1 5.8  NEUTROABS 4.4  --   HGB 11.5* 10.4*  HCT 34.7* 31.6*  MCV 95.9 100.0  PLT 165 150   Basic Metabolic Panel:  Recent Labs Lab 12/23/16 2004 12/25/16 2325 12/26/16 0350  NA 137 139  --   K 6.0* 6.9* 5.7*  CL 92* 96*  --   CO2 28 21*  --   GLUCOSE 97 79  --   BUN 42* 63*  --   CREATININE 12.37* 16.14*  --  CALCIUM 9.6 9.1  --    GFR: Estimated Creatinine Clearance: 4.6 mL/min (A) (by C-G formula based on SCr of 16.14 mg/dL (H)). Liver Function Tests: No results for input(s): AST, ALT, ALKPHOS, BILITOT, PROT, ALBUMIN in the last 168 hours. No results for input(s): LIPASE, AMYLASE in the last 168 hours. No results for input(s): AMMONIA in the last 168 hours. Coagulation Profile: No results for input(s): INR, PROTIME in the last 168 hours. Cardiac Enzymes: No results for input(s): CKTOTAL, CKMB, CKMBINDEX, TROPONINI in the last 168 hours. BNP (last 3 results) No results for input(s): PROBNP in the last 8760 hours. HbA1C:  Recent Labs  12/26/16 0425  HGBA1C 5.9*   CBG:  Recent Labs Lab 12/26/16 0357 12/26/16 0415 12/26/16 0543 12/26/16 0655 12/26/16 0830  GLUCAP 31* 122* 80 85 119*   Lipid Profile: No results for input(s): CHOL, HDL, LDLCALC, TRIG, CHOLHDL, LDLDIRECT in the last 72  hours. Thyroid Function Tests: No results for input(s): TSH, T4TOTAL, FREET4, T3FREE, THYROIDAB in the last 72 hours. Anemia Panel: No results for input(s): VITAMINB12, FOLATE, FERRITIN, TIBC, IRON, RETICCTPCT in the last 72 hours. Urine analysis:    Component Value Date/Time   COLORURINE YELLOW 12/26/2016 0024   APPEARANCEUR CLOUDY (A) 12/26/2016 0024   LABSPEC 1.016 12/26/2016 0024   PHURINE 8.0 12/26/2016 0024   GLUCOSEU NEGATIVE 12/26/2016 0024   HGBUR SMALL (A) 12/26/2016 0024   BILIRUBINUR NEGATIVE 12/26/2016 0024   KETONESUR NEGATIVE 12/26/2016 0024   PROTEINUR 100 (A) 12/26/2016 0024   UROBILINOGEN 0.2 03/08/2011 0253   NITRITE NEGATIVE 12/26/2016 0024   LEUKOCYTESUR LARGE (A) 12/26/2016 0024   Sepsis Labs: @LABRCNTIP (procalcitonin:4,lacticidven:4)  )No results found for this or any previous visit (from the past 240 hour(s)).       Radiology Studies: Dg Chest 2 View  Result Date: 12/26/2016 CLINICAL DATA:  Left-sided chest pain and bilateral lower extremity weakness. EXAM: CHEST  2 VIEW COMPARISON:  04/20/2015 FINDINGS: Stable cardiac enlargement and status post prior CABG. Lung volumes are very low bilaterally with bibasilar atelectasis present. There is no evidence of pulmonary edema, consolidation, pneumothorax, nodule or pleural fluid. Visualized bony structures are unremarkable. IMPRESSION: Stable cardiomegaly.  Low lung volumes with bibasilar atelectasis. Electronically Signed   By: Irish Lack M.D.   On: 12/26/2016 00:05        Scheduled Meds: . allopurinol  100 mg Oral QPM  . aspirin EC  81 mg Oral QPM  . heparin  5,000 Units Subcutaneous Q8H  . ipratropium  1 spray Each Nare QHS  . meclizine  25 mg Oral Daily  . metroNIDAZOLE  500 mg Oral Q8H  . multivitamin  1 tablet Oral QHS  . vitamin B-12  1,000 mcg Oral QPM   Continuous Infusions: . sodium chloride    . sodium chloride    . cefTRIAXone (ROCEPHIN)  IV Stopped (12/26/16 0755)  . [START ON  12/27/2016] vancomycin    . vancomycin 1,000 mg (12/26/16 1142)     LOS: 0 days    Time spent:   Zannie Cove, MD [facility] Pager [phone]  If 7PM-7AM, please contact night-coverage [email or web address] Password [email or web address] 12/26/2016, 11:50 AM

## 2016-12-26 NOTE — ED Notes (Signed)
IV US attempt X1, unsuccessful. IV team at bedside

## 2016-12-27 ENCOUNTER — Encounter (HOSPITAL_COMMUNITY): Payer: Medicare Other

## 2016-12-27 LAB — BASIC METABOLIC PANEL
Anion gap: 19 — ABNORMAL HIGH (ref 5–15)
BUN: 36 mg/dL — ABNORMAL HIGH (ref 6–20)
CHLORIDE: 94 mmol/L — AB (ref 101–111)
CO2: 25 mmol/L (ref 22–32)
CREATININE: 12.39 mg/dL — AB (ref 0.61–1.24)
Calcium: 8.7 mg/dL — ABNORMAL LOW (ref 8.9–10.3)
GFR calc non Af Amer: 3 mL/min — ABNORMAL LOW (ref >60)
GFR, EST AFRICAN AMERICAN: 4 mL/min — AB (ref >60)
Glucose, Bld: 64 mg/dL — ABNORMAL LOW (ref 65–99)
Potassium: 5 mmol/L (ref 3.5–5.1)
Sodium: 138 mmol/L (ref 135–145)

## 2016-12-27 LAB — CBC
HEMATOCRIT: 28.6 % — AB (ref 39.0–52.0)
HEMOGLOBIN: 9.4 g/dL — AB (ref 13.0–17.0)
MCH: 31.6 pg (ref 26.0–34.0)
MCHC: 32.9 g/dL (ref 30.0–36.0)
MCV: 96.3 fL (ref 78.0–100.0)
PLATELETS: 132 10*3/uL — AB (ref 150–400)
RBC: 2.97 MIL/uL — AB (ref 4.22–5.81)
RDW: 15.3 % (ref 11.5–15.5)
WBC: 5.3 10*3/uL (ref 4.0–10.5)

## 2016-12-27 LAB — MRSA PCR SCREENING: MRSA by PCR: INVALID — AB

## 2016-12-27 MED ORDER — SODIUM CHLORIDE 0.9 % IV SOLN: 100.0000 mL | INTRAVENOUS | Status: DC | PRN

## 2016-12-27 MED ORDER — HEPARIN SODIUM (PORCINE) 1000 UNIT/ML DIALYSIS
7000.0000 [IU] | Freq: Once | INTRAMUSCULAR | Status: AC
  Administered 2016-12-28: 7000 [IU] via INTRAVENOUS_CENTRAL
  Filled 2016-12-27: qty 7

## 2016-12-27 MED ORDER — PENTAFLUOROPROP-TETRAFLUOROETH EX AERO: 1.0000 "application " | INHALATION_SPRAY | CUTANEOUS | Status: DC | PRN

## 2016-12-27 MED ORDER — ALTEPLASE 2 MG IJ SOLR: 2.0000 mg | Freq: Once | INTRAMUSCULAR | Status: DC | PRN

## 2016-12-27 MED ORDER — HEPARIN SODIUM (PORCINE) 1000 UNIT/ML DIALYSIS: 1000.0000 [IU] | INTRAMUSCULAR | Status: DC | PRN

## 2016-12-27 MED ORDER — LIDOCAINE-PRILOCAINE 2.5-2.5 % EX CREA: 1.0000 "application " | TOPICAL_CREAM | CUTANEOUS | Status: DC | PRN

## 2016-12-27 MED ORDER — DARBEPOETIN ALFA 60 MCG/0.3ML IJ SOSY
60.0000 ug | PREFILLED_SYRINGE | INTRAMUSCULAR | Status: DC
  Administered 2016-12-28: 60 ug via INTRAVENOUS
  Filled 2016-12-27: qty 0.3

## 2016-12-27 MED ORDER — LIDOCAINE HCL (PF) 1 % IJ SOLN: 5.0000 mL | INTRAMUSCULAR | Status: DC | PRN

## 2016-12-27 NOTE — Progress Notes (Signed)
PROGRESS NOTE    [name]  ZOX:[id number] DOB: 10/13/1935 DOA: 12/25/2016 PCP: Crist FatVan Eyk, Jason, MD  Brief Narrative: 81 y.o. year-old with hx of ESRD on HD, HTN, CAD/ CABG, CHF, DJD, PNA, colon resection 2009. Came to ED last night with gen'd weakness, difficulty ambulating, R foot pain.  In ED was found to have high K 6.9 and infection in R foot.  Had been on OP po abx (doxy / keflex) for this.     Assessment & Plan:     Hyperkalemia-6.9  -Due to missed hemodialysis -s/p Kayexalate and Urgent HD with improvement    End stage renal disease (HCC) -On HD as above, renal consulting      Diabetic foot ulcer with cellulitis -Started on vancomycin, ceftriaxone and Flagyl-day 2 -Blood cultures-NGTD -Wound RN consult appreciated -Diminished pulsations in the right, arterial duplex pending, concern for PVD  Diabetes mellitus -Hemoglobin A1c 5.9 -Not on medications at home -Monitor, some mild hypos noted    CAD/CABG -Stable, only on aspirin at home  DVT prophylaxis: Heparin subcutaneous Code Status: Full code Family Communication: Granddaughter bedside Disposition Plan: Home in 1-2 days if stable, pending workup for PAD  Consultants:   Renal   wound care RN   Antimicrobials:   Vancomycin/ceftriaxone/Flagyl from 8/7   Subjective: Feels better, still with some pain in R foot  Objective: Vitals:   12/26/16 1732 12/26/16 2109 12/27/16 0521 12/27/16 0900  BP: (!) 156/58 (!) 160/48 (!) 123/51 (!) 133/52  Pulse: 69 74 66 88  Resp: 18 18 18 18   Temp: 98.5 F (36.9 C) 99 F (37.2 C) 98.3 F (36.8 C) 98.6 F (37 C)  TempSrc: Oral Oral Oral Oral  SpO2: 99% 97% 97% 98%  Weight:  107.8 kg (237 lb 11.2 oz)    Height:        Intake/Output Summary (Last 24 hours) at 12/27/16 1239 Last data filed at 12/27/16 0900  Gross per 24 hour  Intake              700 ml  Output                0 ml  Net              700 ml   Filed Weights   12/26/16 0915 12/26/16 1239 12/26/16  2109  Weight: 110.5 kg (243 lb 9.7 oz) 107.8 kg (237 lb 10.5 oz) 107.8 kg (237 lb 11.2 oz)    Examination:  Gen: Chronically ill African-American male laying in bed, no distress HEENT: PERRLA, Neck supple, no JVD Lungs: Good air movement bilaterally, CTAB CVS: RRR,No Gallops,Rubs or new Murmurs Abd: soft, Non tender, non distended, BS present Extremities: Right foot, dorsal surface with mild erythema swelling and tenderness much improved, multiple ulcers noted and maceration at the skin between the third fourth and fifth toes Psychiatry: Flat affect     Data Reviewed:   CBC:  Recent Labs Lab 12/23/16 2004 12/25/16 2325 12/27/16 0329  WBC 6.1 5.8 5.3  NEUTROABS 4.4  --   --   HGB 11.5* 10.4* 9.4*  HCT 34.7* 31.6* 28.6*  MCV 95.9 100.0 96.3  PLT 165 150 132*   Basic Metabolic Panel:  Recent Labs Lab 12/23/16 2004 12/25/16 2325 12/26/16 0350 12/27/16 0329  NA 137 139  --  138  K 6.0* 6.9* 5.7* 5.0  CL 92* 96*  --  94*  CO2 28 21*  --  25  GLUCOSE 97 79  --  64*  BUN 42* 63*  --  36*  CREATININE 12.37* 16.14*  --  12.39*  CALCIUM 9.6 9.1  --  8.7*   GFR: Estimated Creatinine Clearance: 5.9 mL/min (A) (by C-G formula based on SCr of 12.39 mg/dL (H)). Liver Function Tests: No results for input(s): AST, ALT, ALKPHOS, BILITOT, PROT, ALBUMIN in the last 168 hours. No results for input(s): LIPASE, AMYLASE in the last 168 hours. No results for input(s): AMMONIA in the last 168 hours. Coagulation Profile: No results for input(s): INR, PROTIME in the last 168 hours. Cardiac Enzymes: No results for input(s): CKTOTAL, CKMB, CKMBINDEX, TROPONINI in the last 168 hours. BNP (last 3 results) No results for input(s): PROBNP in the last 8760 hours. HbA1C:  Recent Labs  12/26/16 0425  HGBA1C 5.9*   CBG:  Recent Labs Lab 12/26/16 0830 12/26/16 1443 12/26/16 1514 12/26/16 1732 12/26/16 2107  GLUCAP 119* 62* 103* 116* 112*   Lipid Profile: No results for  input(s): CHOL, HDL, LDLCALC, TRIG, CHOLHDL, LDLDIRECT in the last 72 hours. Thyroid Function Tests: No results for input(s): TSH, T4TOTAL, FREET4, T3FREE, THYROIDAB in the last 72 hours. Anemia Panel: No results for input(s): VITAMINB12, FOLATE, FERRITIN, TIBC, IRON, RETICCTPCT in the last 72 hours. Urine analysis:    Component Value Date/Time   COLORURINE YELLOW 12/26/2016 0024   APPEARANCEUR CLOUDY (A) 12/26/2016 0024   LABSPEC 1.016 12/26/2016 0024   PHURINE 8.0 12/26/2016 0024   GLUCOSEU NEGATIVE 12/26/2016 0024   HGBUR SMALL (A) 12/26/2016 0024   BILIRUBINUR NEGATIVE 12/26/2016 0024   KETONESUR NEGATIVE 12/26/2016 0024   PROTEINUR 100 (A) 12/26/2016 0024   UROBILINOGEN 0.2 03/08/2011 0253   NITRITE NEGATIVE 12/26/2016 0024   LEUKOCYTESUR LARGE (A) 12/26/2016 0024   Sepsis Labs: @LABRCNTIP (procalcitonin:4,lacticidven:4)  ) Recent Results (from the past 240 hour(s))  MRSA PCR Screening     Status: Abnormal   Collection Time: 12/26/16  5:14 PM  Result Value Ref Range Status   MRSA by PCR INVALID RESULTS, SPECIMEN SENT FOR CULTURE (A) NEGATIVE Final    Comment: RESULT CALLED TO, READ BACK BY AND VERIFIED WITH: E.CASTRO, RN 12/27/16 0057 L.CHAMPION          Radiology Studies: Dg Chest 2 View  Result Date: 12/26/2016 CLINICAL DATA:  Left-sided chest pain and bilateral lower extremity weakness. EXAM: CHEST  2 VIEW COMPARISON:  04/20/2015 FINDINGS: Stable cardiac enlargement and status post prior CABG. Lung volumes are very low bilaterally with bibasilar atelectasis present. There is no evidence of pulmonary edema, consolidation, pneumothorax, nodule or pleural fluid. Visualized bony structures are unremarkable. IMPRESSION: Stable cardiomegaly.  Low lung volumes with bibasilar atelectasis. Electronically Signed   By: Irish Lack M.D.   On: 12/26/2016 00:05        Scheduled Meds: . allopurinol  100 mg Oral QPM  . aspirin EC  81 mg Oral QPM  . darbepoetin (ARANESP)  injection - DIALYSIS  60 mcg Intravenous Q Thu-HD  . feeding supplement (NEPRO CARB STEADY)  237 mL Oral BID BM  . heparin  5,000 Units Subcutaneous Q8H  . ipratropium  1 spray Each Nare QHS  . metroNIDAZOLE  500 mg Oral Q8H  . multivitamin  1 tablet Oral QHS  . vitamin B-12  1,000 mcg Oral QPM   Continuous Infusions: . cefTRIAXone (ROCEPHIN)  IV Stopped (12/27/16 1051)  . vancomycin       LOS: 1 day    Time spent:   Zannie Cove, MD [facility] Pager [phone]  If 7PM-7AM, please contact night-coverage [email or web address] Password [facility] 12/27/2016, 12:39 PM

## 2016-12-27 NOTE — Progress Notes (Signed)
Initial Nutrition Assessment  DOCUMENTATION CODES:   Obesity unspecified  INTERVENTION:   -Pro-Stat BID  -Discontinue Nepro shakes   -Provided brief verbal and written education on Low potassium diet restriction. Pt and family verbalized understanding  -Emphasized the importance of adequate protein intake with regards to wound healing in setting of ESRD on HD. Reviewed sources of protein, encouraged protein intake with all meals and snacks. Protein modular added  -Pt to receive bed time snack  NUTRITION DIAGNOSIS:   Increased nutrient needs related to chronic illness, wound healing as evidenced by estimated needs.  GOAL:   Patient will meet greater than or equal to 90% of their needs   MONITOR:   PO intake, Supplement acceptance, Labs, Weight trends  REASON FOR ASSESSMENT:   Consult Wound healing  ASSESSMENT:   81 yo male admitted with right foot infection, hyperkalemia after missed HD. Pt with hx of ESRD on HD, HTN, CAD/CABG, CHF, colon resection  Pt very flat on visit today, would not engage much during assessment. Family at bedside. Report that pt has good appetite, eats 3 meals per day, even on dialysis days.  Recorded po intake 68% on average. Pt receiving Nepro but does not like.   Pt unsure of UBW, unsure if he has lost any weight recently  Pt is anuric, pt with 2+ edema in LE  Unable to complete Nutrition-Focused physical exam at this time.   Labs: potassium 5.0 (wdl), phosphorus pending Meds: Rena-Vit, B-12  Diet Order:  Diet renal/carb modified with fluid restriction Diet-HS Snack? Nothing; Room service appropriate? Yes; Fluid consistency: Thin  Skin:  Reviewed, no issues  Last BM:  8/8  Height:   Ht Readings from Last 1 Encounters:  12/26/16 5\' 10"  (1.778 m)    Weight: Adjusted weight of 100.6 kg  Wt Readings from Last 1 Encounters:  12/26/16 237 lb 11.2 oz (107.8 kg)    BMI:  Body mass index is 34.11 kg/m.  Estimated Nutritional  Needs:   Kcal:  2200-2500 kcals  Protein:  110-125 g  Fluid:  1000 mL plus UOP  EDUCATION NEEDS:   Education needs addressed  Romelle Starcherate Sherlene Rickel MS, RD, LDN [phone] Pager  [phone] Weekend/On-Call Pager

## 2016-12-27 NOTE — Consult Note (Signed)
[facility] CM Primary Care Navigator  12/27/2016  [name] [birth year] [id number]   Met withpatient and daughter [name]) at the bedside to identify possibledischarge needs. Daughter reports that patient was too weak and unable to stand that hadled to this admission.  Patient endorses Dr. Vassie Loll Eyk with [facility] as his primary care provider.   Patient's daughter [name] in [place] obtain medications without difficulty.  Per daughter, patient with family's assistance manage his medications at home taking it straight out of the containers. Daughter reports that patient is constantly being checked by family since he is "stubborn" and "non- compliant".   Patient has been driving prior to admission. His son [name]- lives close by) will be providingtransportation to hisdoctors' appointments after discharge.  Patient's wife [name]) and daughter [name]) will be the primary caregivers at home.  According to daughter, discharge plan isundetermined yet, pending therapy evaluation/ recommendation and physician order.   Patient and daughter voiced understanding to call primary care provider's office when he returns back home, for a post discharge follow-up appointment within a week or sooner if needs arise.Patient letter (with PCP's contact number) wasprovided as theirreminder.  Patientand daughtervoiced understanding to seekreferral to [facility] care managementservicesfrom primary care provider (on follow-up visit) asdeemed necessary andappropriatefor services.   [facility] care management information provided for any future needs that he may have.  For additional questions please contact:  Edwena Felty A. Mavrik Bynum, BSN, RN-BC [facility] PRIMARY CARE Navigator Cell: [phone]

## 2016-12-27 NOTE — Progress Notes (Signed)
[facility] Progress Note   Subjective:  Seen in room, s/p HD yesterday for hyperkalemia. Sleepy today, but denies CP or dyspnea. Still with R foot pain, on IV abx.  Objective Vitals:   12/26/16 1732 12/26/16 2109 12/27/16 0521 12/27/16 0900  BP: (!) 156/58 (!) 160/48 (!) 123/51 (!) 133/52  Pulse: 69 74 66 88  Resp: 18 18 18 18   Temp: 98.5 F (36.9 C) 99 F (37.2 C) 98.3 F (36.8 C) 98.6 F (37 C)  TempSrc: Oral Oral Oral Oral  SpO2: 99% 97% 97% 98%  Weight:  107.8 kg (237 lb 11.2 oz)    Height:       Physical Exam General: Well appearing, NAD. Heart: RRR; no murmur. Lungs: CTA anteriorly. Extremities: 1+ LE edema, bandages between toes on R foot. Dialysis Access: L AVG  Additional Objective Labs: Basic Metabolic Panel:  Recent Labs Lab 12/23/16 2004 12/25/16 2325 12/26/16 0350 12/27/16 0329  NA 137 139  --  138  K 6.0* 6.9* 5.7* 5.0  CL 92* 96*  --  94*  CO2 28 21*  --  25  GLUCOSE 97 79  --  64*  BUN 42* 63*  --  36*  CREATININE 12.37* 16.14*  --  12.39*  CALCIUM 9.6 9.1  --  8.7*   CBC:  Recent Labs Lab 12/23/16 2004 12/25/16 2325 12/27/16 0329  WBC 6.1 5.8 5.3  NEUTROABS 4.4  --   --   HGB 11.5* 10.4* 9.4*  HCT 34.7* 31.6* 28.6*  MCV 95.9 100.0 96.3  PLT 165 150 132*   CBG:  Recent Labs Lab 12/26/16 0830 12/26/16 1443 12/26/16 1514 12/26/16 1732 12/26/16 2107  GLUCAP 119* 62* 103* 116* 112*   Studies/Results: Dg Chest 2 View  Result Date: 12/26/2016 CLINICAL DATA:  Left-sided chest pain and bilateral lower extremity weakness. EXAM: CHEST  2 VIEW COMPARISON:  04/20/2015 FINDINGS: Stable cardiac enlargement and status post prior CABG. Lung volumes are very low bilaterally with bibasilar atelectasis present. There is no evidence of pulmonary edema, consolidation, pneumothorax, nodule or pleural fluid. Visualized bony structures are unremarkable. IMPRESSION: Stable cardiomegaly.  Low lung volumes with bibasilar atelectasis.  Electronically Signed   By: Irish Lack M.D.   On: 12/26/2016 00:05   Medications: . cefTRIAXone (ROCEPHIN)  IV Stopped (12/27/16 1051)  . vancomycin     . allopurinol  100 mg Oral QPM  . aspirin EC  81 mg Oral QPM  . feeding supplement (NEPRO CARB STEADY)  237 mL Oral BID BM  . heparin  5,000 Units Subcutaneous Q8H  . ipratropium  1 spray Each Nare QHS  . metroNIDAZOLE  500 mg Oral Q8H  . multivitamin  1 tablet Oral QHS  . vitamin B-12  1,000 mcg Oral QPM    Dialysis Orders: [facility] 4h  107.5kg  2/2 bath  Hep 7000   LFA AVG - parsabiv 2.5 - calc 0.25 ug tiw - no esa  Assessment/Plan: 1. Hyperkalemia: S/p HD 8/8, improved today. 2. ESRD: Back to TTS schedule now, for HD today (8/9). 2K bath. 3. HTN/volume:  BP fine, + edema. UF as tolerated, may be able to get a little under EDW. 4. Anemia: Hgb 9.4 (dropping fairly quickly), starting Aranesp weekly. 5. Secondary hyperparathyroidism: Ca ok, Phos pending. No binders listed, follow. 6.  R foot cellulitis: On IV Vancomcyin/Ceftriaxone and PO Flagyl. Per primary. 7. DM: Not on meds, per primary. 8. CAD (Hx CABG): Asymptomatic; per primary.  Ozzie HoyleKatie Stovall, PA-C 12/27/2016, 12:01 PM  [facility] Pager: [phone]  Pt seen, examined and agree w A/P as above.  Vinson Moselleob Brycen Bean MD [facility] pager [phone]   12/27/2016, 12:27 PM

## 2016-12-27 NOTE — Evaluation (Signed)
Physical Therapy Evaluation Patient Details Name: [name] MRN: [id number] DOB: May 22, 1935 Today's Date: 12/27/2016   History of Present Illness  81 yo male with onset of atelectasis, R foot cellulitis, elevated K+, cardiomegaly and generalized weakness.  PMHx:  CABG, CAD, ESRD, HD, DM, CHF, PNA, colon resection  Clinical Impression  Pt is getting up to the chair with PT assistance but struggling to stand up due to combined RLE pain from cellulitis and weakness of LE's.  Will anticipate a need to refer to SNF due to pt's struggle to stand up and walk, and his unsafe techniques, poor balance in standing and need for significant cues to move.  His hand placement and control of standing are limited by above issues, and must be resolved to get home.  Follow acutely for same issues and will anticipate shortening need to stay on at SNF with acute PT.    Follow Up Recommendations SNF    Equipment Recommendations  Rolling walker with 5" wheels    Recommendations for Other Services       Precautions / Restrictions Precautions Precautions: Fall (telemetry) Restrictions Weight Bearing Restrictions: No      Mobility  Bed Mobility Overal bed mobility: Needs Assistance Bed Mobility: Supine to Sit;Sit to Supine     Supine to sit: Mod assist Sit to supine: Mod assist      Transfers Overall transfer level: Needs assistance Equipment used: Rolling walker (2 wheeled);1 person hand held assist Transfers: Sit to/from Stand Sit to Stand: Mod assist         General transfer comment: dense cues for hand placement  Ambulation/Gait             General Gait Details: unable   Stairs            Wheelchair Mobility    Modified Rankin (Stroke Patients Only)       Balance Overall balance assessment: Needs assistance Sitting-balance support: Bilateral upper extremity supported Sitting balance-Leahy Scale: Fair       Standing balance-Leahy Scale: Poor                                Pertinent Vitals/Pain Pain Assessment: 0-10 Pain Score: 7  Pain Location: R foot with any movement or pressure Pain Descriptors / Indicators: Tender;Sharp Pain Intervention(s): Limited activity within patient's tolerance;Monitored during session;Premedicated before session;Repositioned    Home Living Family/patient expects to be discharged to:: Private residence Living Arrangements: Spouse/significant other Available Help at Discharge: Family;Available 24 hours/day Type of Home: House       Home Layout: One level Home Equipment: Cane - single point      Prior Function Level of Independence: Independent with assistive device(s)               Hand Dominance   Dominant Hand: Right    Extremity/Trunk Assessment   Upper Extremity Assessment Upper Extremity Assessment: Generalized weakness    Lower Extremity Assessment Lower Extremity Assessment: Generalized weakness    Cervical / Trunk Assessment Cervical / Trunk Assessment: Kyphotic  Communication   Communication: No difficulties  Cognition Arousal/Alertness: Awake/alert;Lethargic Behavior During Therapy: WFL for tasks assessed/performed Overall Cognitive Status: No family/caregiver present to determine baseline cognitive functioning                                 General Comments: slow to follow verbal  cues and has poor control of standing balance      General Comments General comments (skin integrity, edema, etc.): pt has a band around his toes to keep them separated,     Exercises     Assessment/Plan    PT Assessment Patient needs continued PT services  PT Problem List Decreased strength;Decreased range of motion;Decreased activity tolerance;Decreased balance;Decreased mobility;Decreased coordination;Decreased knowledge of use of DME;Decreased cognition;Decreased safety awareness;Obesity;Pain;Decreased skin integrity       PT Treatment Interventions DME  instruction;Gait training;Functional mobility training;Therapeutic activities;Therapeutic exercise;Balance training;Neuromuscular re-education;Patient/family education    PT Goals (Current goals can be found in the Care Plan section)  Acute Rehab PT Goals Patient Stated Goal: none stated PT Goal Formulation: With patient Time For Goal Achievement: 01/10/17 Potential to Achieve Goals: Good    Frequency Min 2X/week   Barriers to discharge Other (comment) (2person assist needed)      Co-evaluation               AM-PAC PT "6 Clicks" Daily Activity  Outcome Measure Difficulty turning over in bed (including adjusting bedclothes, sheets and blankets)?: Total Difficulty moving from lying on back to sitting on the side of the bed? : Total Difficulty sitting down on and standing up from a chair with arms (e.g., wheelchair, bedside commode, etc,.)?: Total Help needed moving to and from a bed to chair (including a wheelchair)?: A Lot Help needed walking in hospital room?: Total Help needed climbing 3-5 steps with a railing? : Total 6 Click Score: 7    End of Session Equipment Utilized During Treatment: Gait belt Activity Tolerance: Patient limited by fatigue;Patient limited by pain Patient left: in chair;with call bell/phone within reach;with chair alarm set Nurse Communication: Mobility status PT Visit Diagnosis: Unsteadiness on feet (R26.81);Other abnormalities of gait and mobility (R26.89);Muscle weakness (generalized) (M62.81);History of falling (Z91.81)    Time: 1425-1450 PT Time Calculation (min) (ACUTE ONLY): 25 min   Charges:   PT Evaluation $PT Eval Low Complexity: 1 Low PT Treatments $Therapeutic Activity: 8-22 mins   PT G Codes:   PT G-Codes **NOT FOR INPATIENT CLASS** Functional Assessment Tool Used: AM-PAC 6 Clicks Basic Mobility    Ivar DrapeRuth E Gladstone Rosas 12/27/2016, 9:36 PM   Samul Dadauth Khadeem Rockett, PT MS Acute Rehab Dept. Number: [facility] [phone] and [facility] [phone]

## 2016-12-28 ENCOUNTER — Other Ambulatory Visit: Payer: Self-pay | Admitting: *Deleted

## 2016-12-28 ENCOUNTER — Inpatient Hospital Stay (HOSPITAL_COMMUNITY): Payer: Medicare Other

## 2016-12-28 DIAGNOSIS — E11621 Type 2 diabetes mellitus with foot ulcer: Secondary | ICD-10-CM

## 2016-12-28 DIAGNOSIS — Z9115 Patient's noncompliance with renal dialysis: Secondary | ICD-10-CM

## 2016-12-28 DIAGNOSIS — I998 Other disorder of circulatory system: Secondary | ICD-10-CM

## 2016-12-28 DIAGNOSIS — N186 End stage renal disease: Secondary | ICD-10-CM

## 2016-12-28 DIAGNOSIS — I70235 Atherosclerosis of native arteries of right leg with ulceration of other part of foot: Secondary | ICD-10-CM

## 2016-12-28 DIAGNOSIS — Z992 Dependence on renal dialysis: Secondary | ICD-10-CM

## 2016-12-28 LAB — VAS US LOWER EXTREMITY ARTERIAL DUPLEX
RATIBDISTSYS: 63 cm/s
RIGHT ANT DIST TIBAL DIA PSV: 35 cm/s
RIGHT PERO MID SYS: 34 cm/s
RIGHT POPLITEAL PROX EDV: 15 cm/s
RIGHT POST TIB DIST DIA: -23 cm/s
RIGHT POST TIB DIST SYS: -61 cm/s
RIGHT POST TIBIAL EDV: 10 cm/s
RIGHT SUPER FEMORAL DIST EDV: -15 cm/s
RPEROMIDDIA: 11 cm/s
RPOPDPSV: 72 cm/s
RPOPPPSV: 184 cm/s
RSFDPSV: -149 cm/s
RSFMPSV: -138 cm/s
RSFPPSV: 159 cm/s
RTIBMIDDIA: 12 cm/s
RTIBMIDSYS: 30 cm/s
RTPOPDISDIA: 11 cm/s
RTSFAMIDDIA: -17 cm/s
Right ant tibeal sys PSV: 28 cm/s
Right post tibial sys PSV: 59 cm/s

## 2016-12-28 LAB — CBC
HEMATOCRIT: 28.4 % — AB (ref 39.0–52.0)
HEMOGLOBIN: 9.5 g/dL — AB (ref 13.0–17.0)
MCH: 31.4 pg (ref 26.0–34.0)
MCHC: 33.5 g/dL (ref 30.0–36.0)
MCV: 93.7 fL (ref 78.0–100.0)
Platelets: 139 10*3/uL — ABNORMAL LOW (ref 150–400)
RBC: 3.03 MIL/uL — AB (ref 4.22–5.81)
RDW: 14.5 % (ref 11.5–15.5)
WBC: 6.5 10*3/uL (ref 4.0–10.5)

## 2016-12-28 LAB — RENAL FUNCTION PANEL
ANION GAP: 17 — AB (ref 5–15)
Albumin: 3 g/dL — ABNORMAL LOW (ref 3.5–5.0)
BUN: 45 mg/dL — ABNORMAL HIGH (ref 6–20)
CALCIUM: 8.7 mg/dL — AB (ref 8.9–10.3)
CHLORIDE: 94 mmol/L — AB (ref 101–111)
CO2: 23 mmol/L (ref 22–32)
Creatinine, Ser: 14.45 mg/dL — ABNORMAL HIGH (ref 0.61–1.24)
GFR calc non Af Amer: 3 mL/min — ABNORMAL LOW (ref >60)
GFR, EST AFRICAN AMERICAN: 3 mL/min — AB (ref >60)
GLUCOSE: 80 mg/dL (ref 65–99)
POTASSIUM: 5.3 mmol/L — AB (ref 3.5–5.1)
Phosphorus: 6.8 mg/dL — ABNORMAL HIGH (ref 2.5–4.6)
SODIUM: 134 mmol/L — AB (ref 135–145)

## 2016-12-28 LAB — MRSA CULTURE: CULTURE: NOT DETECTED

## 2016-12-28 MED ORDER — DARBEPOETIN ALFA 60 MCG/0.3ML IJ SOSY
PREFILLED_SYRINGE | INTRAMUSCULAR | Status: AC
  Administered 2016-12-28: 60 ug via INTRAVENOUS
  Filled 2016-12-28: qty 0.3

## 2016-12-28 MED ORDER — SUCROFERRIC OXYHYDROXIDE 500 MG PO CHEW
500.0000 mg | CHEWABLE_TABLET | Freq: Three times a day (TID) | ORAL | Status: DC
  Administered 2016-12-28 – 2016-12-29 (×3): 500 mg via ORAL
  Filled 2016-12-28 (×6): qty 1

## 2016-12-28 MED ORDER — OXYCODONE-ACETAMINOPHEN 5-325 MG PO TABS
ORAL_TABLET | ORAL | Status: AC
  Filled 2016-12-28: qty 1

## 2016-12-28 MED ORDER — VANCOMYCIN HCL IN DEXTROSE 1-5 GM/200ML-% IV SOLN
INTRAVENOUS | Status: AC
  Administered 2016-12-28: 1000 mg via INTRAVENOUS
  Filled 2016-12-28: qty 200

## 2016-12-28 NOTE — Progress Notes (Signed)
*  PRELIMINARY RESULTS* Vascular Ultrasound Lower Extremity Arterial Duplex has been completed.  Preliminary findings: Right lower extremity arterial evaluation demonstrates triphasic waveforms from the CFA to the proximal popliteal artery with moderate calcified palque.  No obvious stenosis is noted but flow transitions to monophasic by the distal popliteal artery.  Evaluation of calf arteries is limited by arterial shadowing and dampened flow, vessels demonstrate monophasic flow.  [name] 12/28/2016, 11:09 AM

## 2016-12-28 NOTE — Progress Notes (Signed)
HD tx completed @ 0455  w/o problem, UF goal met, blood rinsed back, VSS, report called to Margie Ege, RN

## 2016-12-28 NOTE — Progress Notes (Signed)
HD tx initiated via 15G x2 w/o problem, pull/push/flush equally w/o problem, VSS, will cont to monitor 

## 2016-12-28 NOTE — Progress Notes (Signed)
Subjective:  Tolerated HD ,now on schedule TTS just out of HD 5 am  ,"when can I go Home"  Objective Vital signs in last 24 hours: Vitals:   12/28/16 0455 12/28/16 0522 12/28/16 0651 12/28/16 0900  BP: (!) 133/31 111/70 (!) 146/42 (!) 142/51  Pulse: 75 (!) 52 74 76  Resp: 17 13 16 16   Temp:  99.3 F (37.4 C) 99 F (37.2 C) 98.4 F (36.9 C)  TempSrc:  Oral Oral Oral  SpO2: 100% 97% 95% 96%  Weight:  107 kg (235 lb 14.3 oz)    Height:       Weight change: -0.828 kg (-1 lb 13.2 oz)  Physical Exam General:  Elderly AAM Well appearing,awoken from sleep  NAD. Heart: RRR; no murmur. Lungs: CTA anteriorly. Extremities: trace + LE edema, bandages between toes on R foot./ callous on bottom R foot  Dialysis Access: L UA AVG pos bruit    Dialysis Orders: [facility] 4h 15min 107.5kg 2/2 bath Hep 7000 LFA AVG - parsabiv 2.5 - calc 0.25 ug tiw - no esa  Problem/Plan: 1. Hyperkalemia: S/p HD 8/8,8/09 late  improved today. 2. ESRD: Back to TTS schedule now,. 2K bath. 3. HTN/volume:  BP/ Volume stable now , 3l uf  And 0.5 below edw  Lower edw at dc ~~106.5 kg  ,  4. Anemia: Hgb 9.4 >9.5 (dropping fairly quickly), starting Aranesp 60mcg weekly q Thursday hd . 5. Secondary hyperparathyroidism: Ca ok, Phos 6.8/ no binder listed on op meds  op records from [facility]. On Velphoro 2 tid meals /on vit d on hd / iv Parsabiv  q hd  6.  R foot cellulitis: On IV Vancomcyin/Ceftriaxone and PO Flagyl. Per primary. 7. DM: Not on meds, per primary.  Ian Pastelavid Tameshia Bonneville, PA-C [facility] Beeper [phone] 12/28/2016,10:11 AM  LOS: 2 days   Labs: Basic Metabolic Panel:  Recent Labs Lab 12/25/16 2325 12/26/16 0350 12/27/16 0329 12/28/16 0135  NA 139  --  138 134*  K 6.9* 5.7* 5.0 5.3*  CL 96*  --  94* 94*  CO2 21*  --  25 23  GLUCOSE 79  --  64* 80  BUN 63*  --  36* 45*  CREATININE 16.14*  --  12.39* 14.45*  CALCIUM 9.1  --  8.7* 8.7*  PHOS  --   --   --  6.8*   Liver  Function Tests:  Recent Labs Lab 12/28/16 0135  ALBUMIN 3.0*  CBC:  Recent Labs Lab 12/23/16 2004 12/25/16 2325 12/27/16 0329 12/28/16 0135  WBC 6.1 5.8 5.3 6.5  NEUTROABS 4.4  --   --   --   HGB 11.5* 10.4* 9.4* 9.5*  HCT 34.7* 31.6* 28.6* 28.4*  MCV 95.9 100.0 96.3 93.7  PLT 165 150 132* 139*   Cardiac Enzymes: No results for input(s): CKTOTAL, CKMB, CKMBINDEX, TROPONINI in the last 168 hours. CBG:  Recent Labs Lab 12/26/16 0830 12/26/16 1443 12/26/16 1514 12/26/16 1732 12/26/16 2107  GLUCAP 119* 62* 103* 116* 112*    Studies/Results: No results found. Medications: . sodium chloride    . sodium chloride    . cefTRIAXone (ROCEPHIN)  IV Stopped (12/27/16 1051)  . vancomycin 1,000 mg (12/28/16 0355)   . oxyCODONE-acetaminophen      . allopurinol  100 mg Oral QPM  . aspirin EC  81 mg Oral QPM  . darbepoetin (ARANESP) injection - DIALYSIS  60 mcg Intravenous Q Thu-HD  . feeding supplement (NEPRO CARB STEADY)  237 mL Oral BID BM  . heparin  5,000 Units Subcutaneous Q8H  . ipratropium  1 spray Each Nare QHS  . metroNIDAZOLE  500 mg Oral Q8H  . multivitamin  1 tablet Oral QHS  . vitamin B-12  1,000 mcg Oral QPM

## 2016-12-28 NOTE — Progress Notes (Signed)
VASCULAR LAB PRELIMINARY  ARTERIAL  ABI completed: Unable to accurately calculate right ABI due to non-compressible arteries. Monophasic waveforms were noted in the posterior tibial and dorsalis pedis arteries. Left ABI of 0.75 is suggestive of moderate arterial occlusive disease at rest. Unable to obtain bilateral TBI's due to low amplitude waveforms.   RIGHT    LEFT    PRESSURE WAVEFORM  PRESSURE WAVEFORM  BRACHIAL 155 Triphasic BRACHIAL HD Access   DP 67 Monophasic DP 74 Monophasic  PT >254 Monophasic PT 116 Monophasic    RIGHT LEFT  ABI  0.75     Ian StainGregory J Abdulahi Turner, RVT 12/28/2016, 10:08 AM

## 2016-12-28 NOTE — Consult Note (Signed)
Hospital Consult    Reason for Consult:  Right toe ulceration Referring Physician:  Jerolyn Center MRN #:  [id number]  History of Present Illness: This is a 81 y.o. male with esrd on hd via left arm avg. He has recent development of rigth toe ulceration that has caused him considerable pain. He had associated edema but denies fevers or erythema. He has not had previous vascular procedures of his lower extremities. He does walk. Never had toe ulceration before. He was admitted for shortness of breath with fluid overload that has improved following hd. WOC has seen and placed silver hydrofiber.   Past Medical History:  Diagnosis Date  . Anginal pain (HCC)    before CABG  . Arthritis   . CHF (congestive heart failure) (HCC)   . Constipation   . Coronary artery disease   . Diabetes mellitus   . ESRD (end stage renal disease) (HCC)    TuesThurs Sat [place]  . Hypertension   . Myocardial infarction (HCC)   . Pneumonia    2 years ago    Past Surgical History:  Procedure Laterality Date  . A/V FISTULAGRAM Left 10/31/2016   Procedure: A/V Fistulagram;  Surgeon: Maeola Harman, MD;  Location: [facility] INVASIVE CV LAB;  Service: Cardiovascular;  Laterality: Left;  . ARTERIOVENOUS GRAFT PLACEMENT Left   . BACK SURGERY    . CORONARY ARTERY BYPASS GRAFT    . LAPAROSCOPIC COLON RESECTION     growth  . REVISION OF ARTERIOVENOUS GORETEX GRAFT Left 11/16/2016   Procedure: REVISION OF ARTERIOVENOUS GORETEX GRAFT;  Surgeon: Chuck Hint, MD;  Location: [facility] OR;  Service: Vascular;  Laterality: Left;  . REVISON OF ARTERIOVENOUS FISTULA Left 09/11/2013   Procedure: REVISON OF ARTERIOVENOUS FISTULA-LEFT ARM; POSSIBLE VENOPLASTY WITH INTEROPERATIVE FISTULOGRAM;  Surgeon: Chuck Hint, MD;  Location: [facility] OR;  Service: Vascular;  Laterality: Left;    Allergies  Allergen Reactions  . Iohexol Swelling and Rash  . Zanaflex [Tizanidine] Other (See Comments)    Shut kidneys down Unable  to walk or void    Prior to Admission medications   Medication Sig Start Date End Date Taking? Authorizing Provider  allopurinol (ZYLOPRIM) 100 MG tablet Take 100 mg by mouth every evening.    Yes [provider]  aspirin EC 81 MG tablet Take 81 mg by mouth every evening.    Yes [provider]  cephALEXin (KEFLEX) 500 MG capsule Take 500 mg by mouth every 8 (eight) hours.   Yes [provider]  doxycycline (VIBRAMYCIN) 100 MG capsule Take 1 capsule (100 mg total) by mouth 2 (two) times daily. 12/23/16  Yes Eber Hong, MD  HYDROcodone-acetaminophen (NORCO/VICODIN) 5-325 MG tablet Take 2 tablets by mouth every 4 (four) hours as needed. Patient taking differently: Take 2 tablets by mouth every 4 (four) hours as needed for moderate pain.  12/23/16  Yes Eber Hong, MD  ipratropium (ATROVENT) 0.06 % nasal spray Place 1 spray into both nostrils at bedtime.  09/09/13  Yes [provider]  meclizine (ANTIVERT) 25 MG tablet Take 25 mg by mouth daily.   Yes [provider]  multivitamin (RENA-VIT) TABS tablet Take 1 tablet by mouth every evening.    Yes [provider]  nitroGLYCERIN (NITROSTAT) 0.4 MG SL tablet Place 0.4 mg under the tongue every 5 (five) minutes x 3 doses as needed for chest pain. 10/10/16  Yes [provider]  oxyCODONE-acetaminophen (PERCOCET/ROXICET) 5-325 MG tablet Take 1 tablet by mouth every 6 (  six) hours as needed for severe pain. 11/16/16  Yes Maris Bergerrinh, Kimberly A, PA-C  traMADol (ULTRAM) 50 MG tablet Take 50 mg by mouth every 8 (eight) hours as needed for moderate pain.   Yes [provider]  vitamin B-12 (CYANOCOBALAMIN) 250 MCG tablet Take 250 mcg by mouth every evening.    Yes [provider]    Social History   Social History  . Marital status: Married    Spouse name: N/A  . Number of children: N/A  . Years of education: N/A   Occupational History  . Not on file.   Social History Main  Topics  . Smoking status: Never Smoker  . Smokeless tobacco: Current User    Types: Snuff  . Alcohol use No  . Drug use: No  . Sexual activity: Not on file   Other Topics Concern  . Not on file   Social History Narrative  . No narrative on file     Family History  Problem Relation Age of Onset  . Cancer Father   . Diabetes Sister     REVIEW OF SYSTEMS (negative unless checked):   Cardiac:  []  Chest pain or chest pressure? []  Shortness of breath upon activity? []  Shortness of breath when lying flat? []  Irregular heart rhythm?  Vascular:  []  Pain in calf, thigh, or hip brought on by walking? []  Pain in feet at night that wakes you up from your sleep? []  Blood clot in your veins? []  Leg swelling?  Pulmonary:  []  Oxygen at home? []  Productive cough? []  Wheezing?  Neurologic:  []  Sudden weakness in arms or legs? []  Sudden numbness in arms or legs? []  Sudden onset of difficult speaking or slurred speech? []  Temporary loss of vision in one eye? []  Problems with dizziness?  Gastrointestinal:  []  Blood in stool? []  Vomited blood?  Genitourinary:  []  Burning when urinating? []  Blood in urine?  Psychiatric:  []  Major depression  Hematologic:  []  Bleeding problems? []  Problems with blood clotting?  Dermatologic:  [x]  Rashes or ulcers?  Constitutional:  []  Fever or chills?  Ear/Nose/Throat:  []  Change in hearing? []  Nose bleeds? []  Sore throat?  Musculoskeletal:  []  Back pain? []  Joint pain? []  Muscle pain?     Physical Examination  Vitals:   12/28/16 0651 12/28/16 0900  BP: (!) 146/42 (!) 142/51  Pulse: 74 76  Resp: 16 16  Temp: 99 F (37.2 C) 98.4 F (36.9 C)  SpO2: 95% 96%   Body mass index is 33.85 kg/m.  General:  WDWN in NAD Gait: Not observed HENT: WNL, normocephalic Pulmonary: normal non-labored breathing Cardiac: palpable femoral and popliteal pulses bilaterally Monophasic pt bilaterallly Abdomen: soft, NT/ND, no  masses Extremities: right foot with drainage most notably between 3rd and 4th toes Musculoskeletal: no muscle wasting or atrophy  Neurologic: A&O X 3; SENSATION: normal; MOTOR FUNCTION:  moving all extremities equally. Speech is fluent/normal Psychiatric:  Appropriate affect and mood  CBC    Component Value Date/Time   WBC 6.5 12/28/2016 0135   RBC 3.03 (L) 12/28/2016 0135   HGB 9.5 (L) 12/28/2016 0135   HCT 28.4 (L) 12/28/2016 0135   PLT 139 (L) 12/28/2016 0135   MCV 93.7 12/28/2016 0135   MCH 31.4 12/28/2016 0135   MCHC 33.5 12/28/2016 0135   RDW 14.5 12/28/2016 0135   LYMPHSABS 1.0 12/23/2016 2004   MONOABS 0.5 12/23/2016 2004   EOSABS 0.2 12/23/2016 2004   BASOSABS 0.1 12/23/2016 2004  BMET    Component Value Date/Time   NA 134 (L) 12/28/2016 0135   K 5.3 (H) 12/28/2016 0135   CL 94 (L) 12/28/2016 0135   CO2 23 12/28/2016 0135   GLUCOSE 80 12/28/2016 0135   BUN 45 (H) 12/28/2016 0135   CREATININE 14.45 (H) 12/28/2016 0135   CALCIUM 8.7 (L) 12/28/2016 0135   CALCIUM 8.4 11/29/2007 1515   GFRNONAA 3 (L) 12/28/2016 0135   GFRAA 3 (L) 12/28/2016 0135    COAGS: Lab Results  Component Value Date   INR 1.3 12/04/2008   INR 1.0 08/21/2007     Non-Invasive Vascular Imaging:   Lower Extremity Arterial Duplex has been completed.  Preliminary findings: Right lower extremity arterial evaluation demonstrates triphasic waveforms from the CFA to the proximal popliteal artery with moderate calcified palque.  No obvious stenosis is noted but flow transitions to monophasic by the distal popliteal artery.  Evaluation of calf arteries is limited by arterial shadowing and dampened flow, vessels demonstrate monophasic flow.   ABI right monophasic non compressible, left monophasic and 0.75  ASSESSMENT/PLAN: This is a 81 y.o. male with esrd, new right toe ulceration. Admitted for fluid overload now resolved. Needs statin therapy if not otherwise contraindicated and continue aspirin.  Will schedule angiogram with likely right lower extremity intervention as outpatient next week. Ok for discharge from vascular standpoint.   Brandon C. Randie Heinz, MD [facility] [place] Office: [phone] Pager: [phone]

## 2016-12-28 NOTE — Progress Notes (Signed)
Subjective:  Tolerated HD ,now on schedule TTS just out of HD 5 am  ,"when can I go Home"  Objective Vital signs in last 24 hours: Vitals:   12/28/16 0430 12/28/16 0455 12/28/16 0522 12/28/16 0651  BP: (!) 130/46 (!) 133/31 111/70 (!) 146/42  Pulse: 75 75 (!) 52 74  Resp: 13 17 13 16   Temp:   99.3 F (37.4 C) 99 F (37.2 C)  TempSrc:   Oral Oral  SpO2: 96% 100% 97% 95%  Weight:   107 kg (235 lb 14.3 oz)   Height:       Weight change: -0.828 kg (-1 lb 13.2 oz)  Physical Exam General:  Elderly AAM Well appearing,awoken from sleep  NAD. Heart: RRR; no murmur. Lungs: CTA anteriorly. Extremities: trace + LE edema, bandages between toes on R foot./ callous on bottom R foot  Dialysis Access: L UA AVG pos bruit    Dialysis Orders: [facility] 4h 107.5kg 2/2 bath Hep 7000 LFA AVG - parsabiv 2.5 - calc 0.25 ug tiw - no esa  Problem/Plan: 1. Hyperkalemia: S/p HD 8/8,8/09 late  improved today. 2. ESRD: Back to TTS schedule now,. 2K bath. 3. HTN/volume:  BP/ Volume stable now , 3l uf  And 0.5 below edw  Lower edw at dc ~~106.5 kg  ,  4. Anemia: Hgb 9.4 >9.5 (dropping fairly quickly), starting Aranesp weekly q Thursday hd . 5. Secondary hyperparathyroidism: Ca ok, Phos 6.8/ no binder listed on op meds  [facility]on vit d on hd / iv Parsabiv  q hd  6.  R foot cellulitis: On IV Vancomcyin/Ceftriaxone and PO Flagyl. Per primary.  Awaiting ABI results.   7. DM: Not on meds, per primary.  Lenny Pastel, PA-C [facility] Beeper [phone] 12/28/2016,8:36 AM  LOS: 2 days   Pt seen, examined, agree w assess/plan as above with additions as indicated.  Vinson Moselle MD [facility] pager [phone]    cell [phone] 12/28/2016, 12:20 PM     Labs: Basic Metabolic Panel:  Recent Labs Lab 12/25/16 2325 12/26/16 0350 12/27/16 0329 12/28/16 0135  NA 139  --  138 134*  K 6.9* 5.7* 5.0 5.3*  CL 96*  --  94* 94*  CO2  21*  --  25 23  GLUCOSE 79  --  64* 80  BUN 63*  --  36* 45*  CREATININE 16.14*  --  12.39* 14.45*  CALCIUM 9.1  --  8.7* 8.7*  PHOS  --   --   --  6.8*   Liver Function Tests:  Recent Labs Lab 12/28/16 0135  ALBUMIN 3.0*  CBC:  Recent Labs Lab 12/23/16 2004 12/25/16 2325 12/27/16 0329 12/28/16 0135  WBC 6.1 5.8 5.3 6.5  NEUTROABS 4.4  --   --   --   HGB 11.5* 10.4* 9.4* 9.5*  HCT 34.7* 31.6* 28.6* 28.4*  MCV 95.9 100.0 96.3 93.7  PLT 165 150 132* 139*   Cardiac Enzymes: No results for input(s): CKTOTAL, CKMB, CKMBINDEX, TROPONINI in the last 168 hours. CBG:  Recent Labs Lab 12/26/16 0830 12/26/16 1443 12/26/16 1514 12/26/16 1732 12/26/16 2107  GLUCAP 119* 62* 103* 116* 112*    Studies/Results: No results found. Medications: . sodium chloride    . sodium chloride    . cefTRIAXone (ROCEPHIN)  IV Stopped (12/27/16 1051)  . vancomycin 1,000 mg (12/28/16 0355)   . oxyCODONE-acetaminophen      . allopurinol  100 mg  Oral QPM  . aspirin EC  81 mg Oral QPM  . darbepoetin (ARANESP) injection - DIALYSIS  60 mcg Intravenous Q Thu-HD  . feeding supplement (NEPRO CARB STEADY)  237 mL Oral BID BM  . heparin  5,000 Units Subcutaneous Q8H  . ipratropium  1 spray Each Nare QHS  . metroNIDAZOLE  500 mg Oral Q8H  . multivitamin  1 tablet Oral QHS  . vitamin B-12  1,000 mcg Oral QPM

## 2016-12-28 NOTE — Progress Notes (Signed)
Pharmacy Antibiotic Note  Ian BlakeWarren J Turner is a [age] y.o. male admitted on 12/25/2016 with weakness and suspected diabetic foot infection. Pharmacy has been consulted for vancomycin dosing. Patient is also receiving ceftriaxone and flagyl per the lower extremity wound protocol.   Patient is ESRD on HD TTS with last HD on Thursday. WBC within normal limits and patient is afebrile.   Cultures negative to date  Plan: Continue vancomycin 1 gram iv Q HD (TThSat) Rocephin 2 grams iv Q 24 hours Metronidazole 500 mg po Q 8  Narrow therapy to po?  -> Cephalexin + doxycycline?   Height: 5\' 10"  (177.8 cm) Weight: 235 lb 14.3 oz (107 kg) IBW/kg (Calculated) : 73  Temp (24hrs), Avg:98.8 F (37.1 C), Min:98.4 F (36.9 C), Max:99.3 F (37.4 C)   Recent Labs Lab 12/23/16 2004 12/23/16 2018 12/25/16 2325 12/27/16 0329 12/28/16 0135  WBC 6.1  --  5.8 5.3 6.5  CREATININE 12.37*  --  16.14* 12.39* 14.45*  LATICACIDVEN  --  1.21  --   --   --     Estimated Creatinine Clearance: 5.1 mL/min (A) (by C-G formula based on SCr of 14.45 mg/dL (H)).    Allergies  Allergen Reactions  . Iohexol Swelling and Rash  . Zanaflex [Tizanidine] Other (See Comments)    Shut kidneys down Unable to walk or void    Thank you Okey RegalLisa Kindall Swaby, PharmD [phone]  12/28/2016 10:29 AM

## 2016-12-28 NOTE — Progress Notes (Signed)
PROGRESS NOTE    [name]  ONG:[id number] DOB: [birth year] DOA: 12/25/2016 PCP: Crist Fat, MD  Brief Narrative: 81 y.o. year-old with hx of ESRD on HD, HTN, CAD/ CABG, CHF, DJD, PNA, colon resection 2009 admitted with shortness of breath due to missed dialysis sessions.feeling better asking to go home.patient had arterial doppler and abi.abi was reported to be 0.75.vascular surgery consulted.   Assessment & Plan:     Hyperkalemia-6.9  -Due to missed hemodialysis -s/p Kayexalate and Urgent HD with improvement    End stage renal disease (HCC) -On HD.nephrology ok with dc tomorrow.      Diabetic foot ulcer with cellulitis -Started on vancomycin, ceftriaxone and Flagyl    CAD/CABG -Stable, only on aspirin at home  DVT prophylaxis: Heparin subcutaneous Code Status: Full code Family Communication: Granddaughter bedside Disposition Plan: vascular surgery to do angiogram as out patient.will switch to po antibiotics tomorrow and dc home tomorrow.  Consultants:   Renal   wound care RN  vascular   Antimicrobials:   Vancomycin/ceftriaxone/Flagyl from 8/7   Subjective: Feels better, still with some pain in R foot  Objective: Vitals:   12/28/16 0455 12/28/16 0522 12/28/16 0651 12/28/16 0900  BP: (!) 133/31 111/70 (!) 146/42 (!) 142/51  Pulse: 75 (!) 52 74 76  Resp: 17 13 16 16   Temp:  99.3 F (37.4 C) 99 F (37.2 C) 98.4 F (36.9 C)  TempSrc:  Oral Oral Oral  SpO2: 100% 97% 95% 96%  Weight:  107 kg (235 lb 14.3 oz)    Height:        Intake/Output Summary (Last 24 hours) at 12/28/16 1502 Last data filed at 12/28/16 0900  Gross per 24 hour  Intake              240 ml  Output             3000 ml  Net            -2760 ml   Filed Weights   12/27/16 2107 12/28/16 0110 12/28/16 0522  Weight: 108.8 kg (239 lb 12.8 oz) 110 kg (242 lb 8.1 oz) 107 kg (235 lb 14.3 oz)    Examination:  Gen: Chronically ill African-American male laying in bed, no  distress HEENT: PERRLA, Neck supple, no JVD Lungs: Good air movement bilaterally, CTAB CVS: RRR,No Gallops,Rubs or new Murmurs Abd: soft, Non tender, non distended, BS present Extremities: Right foot, dorsal surface with mild erythema swelling and tenderness much improved, multiple ulcers noted and maceration at the skin between the third fourth and fifth toes Psychiatry: Flat affect     Data Reviewed:   CBC:  Recent Labs Lab 12/23/16 2004 12/25/16 2325 12/27/16 0329 12/28/16 0135  WBC 6.1 5.8 5.3 6.5  NEUTROABS 4.4  --   --   --   HGB 11.5* 10.4* 9.4* 9.5*  HCT 34.7* 31.6* 28.6* 28.4*  MCV 95.9 100.0 96.3 93.7  PLT 165 150 132* 139*   Basic Metabolic Panel:  Recent Labs Lab 12/23/16 2004 12/25/16 2325 12/26/16 0350 12/27/16 0329 12/28/16 0135  NA 137 139  --  138 134*  K 6.0* 6.9* 5.7* 5.0 5.3*  CL 92* 96*  --  94* 94*  CO2 28 21*  --  25 23  GLUCOSE 97 79  --  64* 80  BUN 42* 63*  --  36* 45*  CREATININE 12.37* 16.14*  --  12.39* 14.45*  CALCIUM 9.6 9.1  --  8.7* 8.7*  PHOS  --   --   --   --  6.8*   GFR: Estimated Creatinine Clearance: 5.1 mL/min (A) (by C-G formula based on SCr of 14.45 mg/dL (H)). Liver Function Tests:  Recent Labs Lab 12/28/16 0135  ALBUMIN 3.0*   No results for input(s): LIPASE, AMYLASE in the last 168 hours. No results for input(s): AMMONIA in the last 168 hours. Coagulation Profile: No results for input(s): INR, PROTIME in the last 168 hours. Cardiac Enzymes: No results for input(s): CKTOTAL, CKMB, CKMBINDEX, TROPONINI in the last 168 hours. BNP (last 3 results) No results for input(s): PROBNP in the last 8760 hours. HbA1C:  Recent Labs  12/26/16 0425  HGBA1C 5.9*   CBG:  Recent Labs Lab 12/26/16 0830 12/26/16 1443 12/26/16 1514 12/26/16 1732 12/26/16 2107  GLUCAP 119* 62* 103* 116* 112*   Lipid Profile: No results for input(s): CHOL, HDL, LDLCALC, TRIG, CHOLHDL, LDLDIRECT in the last 72 hours. Thyroid  Function Tests: No results for input(s): TSH, T4TOTAL, FREET4, T3FREE, THYROIDAB in the last 72 hours. Anemia Panel: No results for input(s): VITAMINB12, FOLATE, FERRITIN, TIBC, IRON, RETICCTPCT in the last 72 hours. Urine analysis:    Component Value Date/Time   COLORURINE YELLOW 12/26/2016 0024   APPEARANCEUR CLOUDY (A) 12/26/2016 0024   LABSPEC 1.016 12/26/2016 0024   PHURINE 8.0 12/26/2016 0024   GLUCOSEU NEGATIVE 12/26/2016 0024   HGBUR SMALL (A) 12/26/2016 0024   BILIRUBINUR NEGATIVE 12/26/2016 0024   KETONESUR NEGATIVE 12/26/2016 0024   PROTEINUR 100 (A) 12/26/2016 0024   UROBILINOGEN 0.2 03/08/2011 0253   NITRITE NEGATIVE 12/26/2016 0024   LEUKOCYTESUR LARGE (A) 12/26/2016 0024   Sepsis Labs: @LABRCNTIP (procalcitonin:4,lacticidven:4)  ) Recent Results (from the past 240 hour(s))  Blood Cultures x 2 sites     Status: None (Preliminary result)   Collection Time: 12/26/16  4:01 AM  Result Value Ref Range Status   Specimen Description BLOOD RIGHT HAND  Final   Special Requests   Final    BOTTLES DRAWN AEROBIC AND ANAEROBIC Blood Culture adequate volume   Culture NO GROWTH 2 DAYS  Final   Report Status PENDING  Incomplete  Blood Cultures x 2 sites     Status: None (Preliminary result)   Collection Time: 12/26/16  2:14 PM  Result Value Ref Range Status   Specimen Description BLOOD RIGHT ANTECUBITAL  Final   Special Requests   Final    BOTTLES DRAWN AEROBIC AND ANAEROBIC Blood Culture adequate volume   Culture NO GROWTH 2 DAYS  Final   Report Status PENDING  Incomplete  MRSA PCR Screening     Status: Abnormal   Collection Time: 12/26/16  5:14 PM  Result Value Ref Range Status   MRSA by PCR INVALID RESULTS, SPECIMEN SENT FOR CULTURE (A) NEGATIVE Final    Comment: RESULT CALLED TO, READ BACK BY AND VERIFIED WITH: E.CASTRO, RN 12/27/16 0057 L.CHAMPION          Radiology Studies: No results found.      Scheduled Meds: . allopurinol  100 mg Oral QPM  . aspirin  EC  81 mg Oral QPM  . darbepoetin (ARANESP) injection - DIALYSIS  60 mcg Intravenous Q Thu-HD  . feeding supplement (NEPRO CARB STEADY)  237 mL Oral BID BM  . heparin  5,000 Units Subcutaneous Q8H  . ipratropium  1 spray Each Nare QHS  . metroNIDAZOLE  500 mg Oral Q8H  . multivitamin  1 tablet Oral QHS  . sucroferric oxyhydroxide  500 mg  Oral TID WC  . vitamin B-12  1,000 mcg Oral QPM   Continuous Infusions: . sodium chloride    . sodium chloride    . cefTRIAXone (ROCEPHIN)  IV Stopped (12/28/16 1111)  . vancomycin 1,000 mg (12/28/16 0355)     LOS: 2 days    Time spent: 32 min  Dr Jerelene Redden  Pager [phone]  If 7PM-7AM, please contact night-coverage [email or web address] Password [email or web address] 12/28/2016, 3:02 PM

## 2016-12-29 LAB — CBC
HCT: 31.5 % — ABNORMAL LOW (ref 39.0–52.0)
HEMOGLOBIN: 10.4 g/dL — AB (ref 13.0–17.0)
MCH: 31.4 pg (ref 26.0–34.0)
MCHC: 33 g/dL (ref 30.0–36.0)
MCV: 95.2 fL (ref 78.0–100.0)
Platelets: 149 10*3/uL — ABNORMAL LOW (ref 150–400)
RBC: 3.31 MIL/uL — ABNORMAL LOW (ref 4.22–5.81)
RDW: 14.6 % (ref 11.5–15.5)
WBC: 6.1 10*3/uL (ref 4.0–10.5)

## 2016-12-29 LAB — RENAL FUNCTION PANEL
ANION GAP: 14 (ref 5–15)
Albumin: 3 g/dL — ABNORMAL LOW (ref 3.5–5.0)
BUN: 34 mg/dL — AB (ref 6–20)
CALCIUM: 9.3 mg/dL (ref 8.9–10.3)
CO2: 27 mmol/L (ref 22–32)
Chloride: 94 mmol/L — ABNORMAL LOW (ref 101–111)
Creatinine, Ser: 12.18 mg/dL — ABNORMAL HIGH (ref 0.61–1.24)
GFR calc Af Amer: 4 mL/min — ABNORMAL LOW (ref >60)
GFR calc non Af Amer: 3 mL/min — ABNORMAL LOW (ref >60)
GLUCOSE: 102 mg/dL — AB (ref 65–99)
Phosphorus: 6.2 mg/dL — ABNORMAL HIGH (ref 2.5–4.6)
Potassium: 4.6 mmol/L (ref 3.5–5.1)
SODIUM: 135 mmol/L (ref 135–145)

## 2016-12-29 MED ORDER — AMOXICILLIN-POT CLAVULANATE 500-125 MG PO TABS: 1.0000 | ORAL_TABLET | Freq: Every day | ORAL | 0 refills | Status: DC

## 2016-12-29 MED ORDER — VANCOMYCIN HCL IN DEXTROSE 1-5 GM/200ML-% IV SOLN
INTRAVENOUS | Status: AC
  Administered 2016-12-29: 1000 mg via INTRAVENOUS
  Filled 2016-12-29: qty 200

## 2016-12-29 MED ORDER — OXYCODONE-ACETAMINOPHEN 5-325 MG PO TABS
ORAL_TABLET | ORAL | Status: AC
  Filled 2016-12-29: qty 1

## 2016-12-29 NOTE — Progress Notes (Signed)
Discharge instructions and medications discussed with patient & granddaughter.  All questions answered.

## 2016-12-29 NOTE — Progress Notes (Signed)
   Patient is on schedule for aortogram with possible intervention of right lower extremity next Tuesday. Ok for discharge and will perform procedure as outpatient.   Zack Crager C. Randie Heinzain, MD [facility] Office: [phone] Pager: [phone]

## 2016-12-29 NOTE — Discharge Summary (Signed)
Physician Discharge Summary  [name] UEA:[id number] DOB: May 15, 1936 DOA: 12/25/2016  PCP: Crist Fat, MD  Admit date: 12/25/2016 Discharge date: 12/29/2016  Admitted From: home Disposition:  stable  Recommendations for Outpatient Follow-up:  1. Follow up with PCP in 1-2 weeks 2. Please obtain BMP/CBC in one week 3. Please follow up on the following pending results:  Home Health:no Equipment/Devices:  Discharge Condition: CODE STATUS:full Diet recommendation: heart healthy  Brief/Interim Summary:81 yo admitted with shortness of breath fluid overload due to missed dialysis sessions.Patient received dialysis and symptoms got better.he was also found to have severe pvd abi 0.75.consulted vascular surgery who saw the patient and is going to follow up with them for angiogram,he was also started on antibiotic for cellulitis of right lower extremity .   Discharge Diagnoses:  Principal Problem:   Noncompliance of patient with renal dialysis [facility] [place]) Active Problems:   End stage renal disease (HCC)   Hyperkalemia   Ulcer of right foot due to type 2 diabetes mellitus (HCC)   Cellulitis of right foot  Patient will be discharged on augmentin and vancomycin for cellultis.  Discharge Instructions follow up with vascular surgeon,pcp,nephrologist.   Allergies as of 12/29/2016      Reactions   Iohexol Swelling, Rash   Zanaflex [tizanidine] Other (See Comments)   Shut kidneys down Unable to walk or void      Medication List    STOP taking these medications   cephALEXin 500 MG capsule Commonly known as:  KEFLEX   doxycycline 100 MG capsule Commonly known as:  VIBRAMYCIN   HYDROcodone-acetaminophen 5-325 MG tablet Commonly known as:  NORCO/VICODIN   oxyCODONE-acetaminophen 5-325 MG tablet Commonly known as:  PERCOCET/ROXICET     TAKE these medications   allopurinol 100 MG tablet Commonly known as:  ZYLOPRIM Take 100 mg by mouth every evening.   amoxicillin-clavulanate  500-125 MG tablet Commonly known as:  AUGMENTIN Take 1 tablet (500 mg total) by mouth daily.   aspirin EC 81 MG tablet Take 81 mg by mouth every evening.   ipratropium 0.06 % nasal spray Commonly known as:  ATROVENT Place 1 spray into both nostrils at bedtime.   meclizine 25 MG tablet Commonly known as:  ANTIVERT Take 25 mg by mouth daily.   multivitamin Tabs tablet Take 1 tablet by mouth every evening.   nitroGLYCERIN 0.4 MG SL tablet Commonly known as:  NITROSTAT Place 0.4 mg under the tongue every 5 (five) minutes x 3 doses as needed for chest pain.   traMADol 50 MG tablet Commonly known as:  ULTRAM Take 50 mg by mouth every 8 (eight) hours as needed for moderate pain.   vitamin B-12 250 MCG tablet Commonly known as:  CYANOCOBALAMIN Take 250 mcg by mouth every evening.       Allergies  Allergen Reactions  . Iohexol Swelling and Rash  . Zanaflex [Tizanidine] Other (See Comments)    Shut kidneys down Unable to walk or void    Consultations:vascular surgery  nephrology   Procedures/Studies: Dg Chest 2 View  Result Date: 12/26/2016 CLINICAL DATA:  Left-sided chest pain and bilateral lower extremity weakness. EXAM: CHEST  2 VIEW COMPARISON:  04/20/2015 FINDINGS: Stable cardiac enlargement and status post prior CABG. Lung volumes are very low bilaterally with bibasilar atelectasis present. There is no evidence of pulmonary edema, consolidation, pneumothorax, nodule or pleural fluid. Visualized bony structures are unremarkable. IMPRESSION: Stable cardiomegaly.  Low lung volumes with bibasilar atelectasis. Electronically Signed   By: Sherrine Maples  Fredia Sorrow M.D.   On: 12/26/2016 00:05   Dg Foot Complete Right  Result Date: 12/23/2016 CLINICAL DATA:  Right foot pain, swelling and drainage times several days. EXAM: RIGHT FOOT COMPLETE - 3+ VIEW COMPARISON:  CT from 12/19/2016 FINDINGS: There is no evidence of acute fracture or dislocation. Soft tissue swelling of the included ankle  and dorsum of the mid and forefoot. Plantar and dorsal calcaneal enthesophytes. No bone destruction to suggest osteomyelitis. Diffuse vascular calcifications are present which can be seen in diabetes. IMPRESSION: Diffuse soft tissue swelling of the included ankle and foot consistent with soft tissue edema or cellulitis. No underlying fracture or evidence of osteomyelitis Electronically Signed   By: Tollie Eth M.D.   On: 12/23/2016 23:05     Subjective:   Discharge Exam: Vitals:   12/29/16 0300 12/29/16 0929  BP: (!) 182/75 (!) 154/79  Pulse: 73 72  Resp: 18 18  Temp: 98.5 F (36.9 C) 97.6 F (36.4 C)  SpO2: 97% 98%   Vitals:   12/28/16 1709 12/28/16 2100 12/29/16 0300 12/29/16 0929  BP: (!) 131/51 (!) 141/41 (!) 182/75 (!) 154/79  Pulse: 66 77 73 72  Resp: 18 17 18 18   Temp: 98.6 F (37 C) 99 F (37.2 C) 98.5 F (36.9 C) 97.6 F (36.4 C)  TempSrc: Oral Oral Oral Oral  SpO2: 98% 98% 97% 98%  Weight:  107 kg (235 lb 14.3 oz)    Height:        General: Pt is alert, awake, not in acute distress Cardiovascular: RRR, S1/S2 +, no rubs, no gallops Respiratory: CTA bilaterally, no wheezing, no rhonchi Abdominal: Soft, NT, ND, bowel sounds + Extremities: no edema, no cyanosis    The results of significant diagnostics from this hospitalization (including imaging, microbiology, ancillary and laboratory) are listed below for reference.     Microbiology: Recent Results (from the past 240 hour(s))  Blood Cultures x 2 sites     Status: None (Preliminary result)   Collection Time: 12/26/16  4:01 AM  Result Value Ref Range Status   Specimen Description BLOOD RIGHT HAND  Final   Special Requests   Final    BOTTLES DRAWN AEROBIC AND ANAEROBIC Blood Culture adequate volume   Culture NO GROWTH 2 DAYS  Final   Report Status PENDING  Incomplete  Blood Cultures x 2 sites     Status: None (Preliminary result)   Collection Time: 12/26/16  2:14 PM  Result Value Ref Range Status    Specimen Description BLOOD RIGHT ANTECUBITAL  Final   Special Requests   Final    BOTTLES DRAWN AEROBIC AND ANAEROBIC Blood Culture adequate volume   Culture NO GROWTH 2 DAYS  Final   Report Status PENDING  Incomplete  MRSA PCR Screening     Status: Abnormal   Collection Time: 12/26/16  5:14 PM  Result Value Ref Range Status   MRSA by PCR INVALID RESULTS, SPECIMEN SENT FOR CULTURE (A) NEGATIVE Final    Comment: RESULT CALLED TO, READ BACK BY AND VERIFIED WITH: E.CASTRO, RN 12/27/16 0057 L.CHAMPION   MRSA culture     Status: None   Collection Time: 12/26/16  5:14 PM  Result Value Ref Range Status   Specimen Description NASAL SWAB  Final   Special Requests NONE  Final   Culture NO MRSA DETECTED  Final   Report Status 12/28/2016 FINAL  Final     Labs: BNP (last 3 results)  Recent Labs  12/23/16 2004  BNP 371.8*  Basic Metabolic Panel:  Recent Labs Lab 12/23/16 2004 12/25/16 2325 12/26/16 0350 12/27/16 0329 12/28/16 0135  NA 137 139  --  138 134*  K 6.0* 6.9* 5.7* 5.0 5.3*  CL 92* 96*  --  94* 94*  CO2 28 21*  --  25 23  GLUCOSE 97 79  --  64* 80  BUN 42* 63*  --  36* 45*  CREATININE 12.37* 16.14*  --  12.39* 14.45*  CALCIUM 9.6 9.1  --  8.7* 8.7*  PHOS  --   --   --   --  6.8*   Liver Function Tests:  Recent Labs Lab 12/28/16 0135  ALBUMIN 3.0*   No results for input(s): LIPASE, AMYLASE in the last 168 hours. No results for input(s): AMMONIA in the last 168 hours. CBC:  Recent Labs Lab 12/23/16 2004 12/25/16 2325 12/27/16 0329 12/28/16 0135  WBC 6.1 5.8 5.3 6.5  NEUTROABS 4.4  --   --   --   HGB 11.5* 10.4* 9.4* 9.5*  HCT 34.7* 31.6* 28.6* 28.4*  MCV 95.9 100.0 96.3 93.7  PLT 165 150 132* 139*   Cardiac Enzymes: No results for input(s): CKTOTAL, CKMB, CKMBINDEX, TROPONINI in the last 168 hours. BNP: Invalid input(s): POCBNP CBG:  Recent Labs Lab 12/26/16 0830 12/26/16 1443 12/26/16 1514 12/26/16 1732 12/26/16 2107  GLUCAP 119* 62* 103*  116* 112*   D-Dimer No results for input(s): DDIMER in the last 72 hours. Hgb A1c No results for input(s): HGBA1C in the last 72 hours. Lipid Profile No results for input(s): CHOL, HDL, LDLCALC, TRIG, CHOLHDL, LDLDIRECT in the last 72 hours. Thyroid function studies No results for input(s): TSH, T4TOTAL, T3FREE, THYROIDAB in the last 72 hours.  Invalid input(s): FREET3 Anemia work up No results for input(s): VITAMINB12, FOLATE, FERRITIN, TIBC, IRON, RETICCTPCT in the last 72 hours. Urinalysis    Component Value Date/Time   COLORURINE YELLOW 12/26/2016 0024   APPEARANCEUR CLOUDY (A) 12/26/2016 0024   LABSPEC 1.016 12/26/2016 0024   PHURINE 8.0 12/26/2016 0024   GLUCOSEU NEGATIVE 12/26/2016 0024   HGBUR SMALL (A) 12/26/2016 0024   BILIRUBINUR NEGATIVE 12/26/2016 0024   KETONESUR NEGATIVE 12/26/2016 0024   PROTEINUR 100 (A) 12/26/2016 0024   UROBILINOGEN 0.2 03/08/2011 0253   NITRITE NEGATIVE 12/26/2016 0024   LEUKOCYTESUR LARGE (A) 12/26/2016 0024   Sepsis Labs Invalid input(s): PROCALCITONIN,  WBC,  LACTICIDVEN Microbiology Recent Results (from the past 240 hour(s))  Blood Cultures x 2 sites     Status: None (Preliminary result)   Collection Time: 12/26/16  4:01 AM  Result Value Ref Range Status   Specimen Description BLOOD RIGHT HAND  Final   Special Requests   Final    BOTTLES DRAWN AEROBIC AND ANAEROBIC Blood Culture adequate volume   Culture NO GROWTH 2 DAYS  Final   Report Status PENDING  Incomplete  Blood Cultures x 2 sites     Status: None (Preliminary result)   Collection Time: 12/26/16  2:14 PM  Result Value Ref Range Status   Specimen Description BLOOD RIGHT ANTECUBITAL  Final   Special Requests   Final    BOTTLES DRAWN AEROBIC AND ANAEROBIC Blood Culture adequate volume   Culture NO GROWTH 2 DAYS  Final   Report Status PENDING  Incomplete  MRSA PCR Screening     Status: Abnormal   Collection Time: 12/26/16  5:14 PM  Result Value Ref Range Status   MRSA  by PCR INVALID RESULTS, SPECIMEN SENT FOR CULTURE (  A) NEGATIVE Final    Comment: RESULT CALLED TO, READ BACK BY AND VERIFIED WITH: E.CASTRO, RN 12/27/16 0057 L.CHAMPION   MRSA culture     Status: None   Collection Time: 12/26/16  5:14 PM  Result Value Ref Range Status   Specimen Description NASAL SWAB  Final   Special Requests NONE  Final   Culture NO MRSA DETECTED  Final   Report Status 12/28/2016 FINAL  Final     Time coordinating discharge: Over 30 minutes  SIGNED:   Alwyn Ren, MD  [facility] 12/29/2016, 11:19 AM Pager   If 7PM-7AM, please contact night-coverage [email or web address] Password [email or web address]

## 2016-12-29 NOTE — Procedures (Signed)
   I was present at this dialysis session, have reviewed the session itself and made  appropriate changes Rob Denario Bagot MD [facility] pager [phone]   12/29/2016, 3:01 PM    

## 2016-12-29 NOTE — Progress Notes (Signed)
Subjective:  No c/o's   Objective Vital signs in last 24 hours: Vitals:   12/28/16 1709 12/28/16 2100 12/29/16 0300 12/29/16 0929  BP: (!) 131/51 (!) 141/41 (!) 182/75 (!) 154/79  Pulse: 66 77 73 72  Resp: 18 17 18 18   Temp: 98.6 F (37 C) 99 F (37.2 C) 98.5 F (36.9 C) 97.6 F (36.4 C)  TempSrc: Oral Oral Oral Oral  SpO2: 98% 98% 97% 98%  Weight:  107 kg (235 lb 14.3 oz)    Height:       Weight change: -1.772 kg (-3 lb 14.5 oz)  Physical Exam General:  Elderly AAM Well appearing,awoken from sleep  NAD. Heart: RRR; no murmur. Lungs: CTA anteriorly. Extremities: trace + LE edema, bandages between toes on R foot./ callous on bottom R foot  Dialysis Access: L UA AVG pos bruit    Dialysis Orders: [facility] 4h 107.5kg 2/2 bath Hep 7000 LFA AVG - parsabiv 2.5 - calc 0.25 ug tiw - no esa  Problem/Plan: 1. R foot cellulitis: AVI showed R monophasic and L monophasic and 0.75; per VVS recommend statin and cont asa.  They are planning to schedule an angiogram, possibly in OP setting next week.   2. ESRD: TTS HD 3. HTN/volume:  stable volume, at dry wt  4. Anemia: Hgb 9.4 >9.5 (dropping fairly quickly), starting Aranesp weekly q Thursday hd . 5. Secondary hyperparathyroidism: Ca ok, Phos 6.8/ no binder listed on op meds  [facility]on vit d on hd / iv Parsabiv  q hd  6. Hyperkalemia: resolved    7. DM: Not on meds, per primary.   Vinson Moselle MD [employer] pgr [phone]   12/29/2016, 9:55 AM      Labs: Basic Metabolic Panel:  Recent Labs Lab 12/25/16 2325 12/26/16 0350 12/27/16 0329 12/28/16 0135  NA 139  --  138 134*  K 6.9* 5.7* 5.0 5.3*  CL 96*  --  94* 94*  CO2 21*  --  25 23  GLUCOSE 79  --  64* 80  BUN 63*  --  36* 45*  CREATININE 16.14*  --  12.39* 14.45*  CALCIUM 9.1  --  8.7* 8.7*  PHOS  --   --   --  6.8*   Liver Function Tests:  Recent Labs Lab 12/28/16 0135  ALBUMIN 3.0*   CBC:  Recent Labs Lab 12/23/16 2004 12/25/16 2325 12/27/16 0329 12/28/16 0135  WBC 6.1 5.8 5.3 6.5  NEUTROABS 4.4  --   --   --   HGB 11.5* 10.4* 9.4* 9.5*  HCT 34.7* 31.6* 28.6* 28.4*  MCV 95.9 100.0 96.3 93.7  PLT 165 150 132* 139*   Cardiac Enzymes: No results for input(s): CKTOTAL, CKMB, CKMBINDEX, TROPONINI in the last 168 hours. CBG:  Recent Labs Lab 12/26/16 0830 12/26/16 1443 12/26/16 1514 12/26/16 1732 12/26/16 2107  GLUCAP 119* 62* 103* 116* 112*    Studies/Results: No results found. Medications: . sodium chloride    . sodium chloride    . cefTRIAXone (ROCEPHIN)  IV Stopped (12/28/16 1111)  . vancomycin 1,000 mg (12/28/16 0355)   . allopurinol  100 mg Oral QPM  . aspirin EC  81 mg Oral QPM  . darbepoetin (ARANESP) injection - DIALYSIS  60 mcg Intravenous Q Thu-HD  . feeding supplement (NEPRO CARB STEADY)  237 mL Oral BID BM  . heparin  5,000 Units Subcutaneous Q8H  . ipratropium  1 spray Each  Nare QHS  . metroNIDAZOLE  500 mg Oral Q8H  . multivitamin  1 tablet Oral QHS  . sucroferric oxyhydroxide  500 mg Oral TID WC  . vitamin B-12  1,000 mcg Oral QPM

## 2016-12-31 LAB — CULTURE, BLOOD (ROUTINE X 2)
CULTURE: NO GROWTH
Culture: NO GROWTH
Special Requests: ADEQUATE
Special Requests: ADEQUATE

## 2017-01-01 ENCOUNTER — Inpatient Hospital Stay (HOSPITAL_COMMUNITY)
Admission: EM | Admit: 2017-01-01 | Discharge: 2017-01-07 | DRG: 871 | Disposition: A | Discharge status: Skilled Nursing Facility | Payer: Medicare Other | Attending: Internal Medicine | Admitting: Internal Medicine

## 2017-01-01 ENCOUNTER — Inpatient Hospital Stay (HOSPITAL_COMMUNITY): Payer: Medicare Other

## 2017-01-01 ENCOUNTER — Encounter (HOSPITAL_COMMUNITY)
Admission: EM | Disposition: A | Discharge status: Skilled Nursing Facility | Payer: Self-pay | Source: Home / Self Care | Attending: Internal Medicine

## 2017-01-01 ENCOUNTER — Other Ambulatory Visit: Payer: Self-pay

## 2017-01-01 ENCOUNTER — Other Ambulatory Visit (HOSPITAL_COMMUNITY): Payer: Self-pay

## 2017-01-01 ENCOUNTER — Encounter (HOSPITAL_COMMUNITY): Payer: Self-pay

## 2017-01-01 ENCOUNTER — Ambulatory Visit (HOSPITAL_COMMUNITY): Admission: RE | Admit: 2017-01-01 | Payer: Medicare Other | Source: Ambulatory Visit | Admitting: Surgery

## 2017-01-01 DIAGNOSIS — F17290 Nicotine dependence, other tobacco product, uncomplicated: Secondary | ICD-10-CM | POA: Diagnosis present

## 2017-01-01 DIAGNOSIS — L03115 Cellulitis of right lower limb: Secondary | ICD-10-CM | POA: Diagnosis present

## 2017-01-01 DIAGNOSIS — E1152 Type 2 diabetes mellitus with diabetic peripheral angiopathy with gangrene: Secondary | ICD-10-CM | POA: Diagnosis present

## 2017-01-01 DIAGNOSIS — L97519 Non-pressure chronic ulcer of other part of right foot with unspecified severity: Secondary | ICD-10-CM | POA: Diagnosis not present

## 2017-01-01 DIAGNOSIS — L8992 Pressure ulcer of unspecified site, stage 2: Secondary | ICD-10-CM | POA: Diagnosis not present

## 2017-01-01 DIAGNOSIS — I2510 Atherosclerotic heart disease of native coronary artery without angina pectoris: Secondary | ICD-10-CM | POA: Diagnosis not present

## 2017-01-01 DIAGNOSIS — Z992 Dependence on renal dialysis: Secondary | ICD-10-CM | POA: Diagnosis not present

## 2017-01-01 DIAGNOSIS — R6520 Severe sepsis without septic shock: Secondary | ICD-10-CM | POA: Diagnosis not present

## 2017-01-01 DIAGNOSIS — I70235 Atherosclerosis of native arteries of right leg with ulceration of other part of foot: Secondary | ICD-10-CM | POA: Diagnosis not present

## 2017-01-01 DIAGNOSIS — N186 End stage renal disease: Secondary | ICD-10-CM | POA: Diagnosis not present

## 2017-01-01 DIAGNOSIS — I252 Old myocardial infarction: Secondary | ICD-10-CM | POA: Diagnosis not present

## 2017-01-01 DIAGNOSIS — I120 Hypertensive chronic kidney disease with stage 5 chronic kidney disease or end stage renal disease: Secondary | ICD-10-CM | POA: Diagnosis not present

## 2017-01-01 DIAGNOSIS — R9431 Abnormal electrocardiogram [ECG] [EKG]: Secondary | ICD-10-CM | POA: Diagnosis not present

## 2017-01-01 DIAGNOSIS — M6281 Muscle weakness (generalized): Secondary | ICD-10-CM | POA: Diagnosis not present

## 2017-01-01 DIAGNOSIS — G9341 Metabolic encephalopathy: Secondary | ICD-10-CM | POA: Diagnosis present

## 2017-01-01 DIAGNOSIS — R262 Difficulty in walking, not elsewhere classified: Secondary | ICD-10-CM | POA: Diagnosis not present

## 2017-01-01 DIAGNOSIS — Z794 Long term (current) use of insulin: Secondary | ICD-10-CM | POA: Diagnosis not present

## 2017-01-01 DIAGNOSIS — R55 Syncope and collapse: Secondary | ICD-10-CM | POA: Diagnosis not present

## 2017-01-01 DIAGNOSIS — I132 Hypertensive heart and chronic kidney disease with heart failure and with stage 5 chronic kidney disease, or end stage renal disease: Secondary | ICD-10-CM | POA: Diagnosis not present

## 2017-01-01 DIAGNOSIS — L0390 Cellulitis, unspecified: Secondary | ICD-10-CM | POA: Diagnosis not present

## 2017-01-01 DIAGNOSIS — Z809 Family history of malignant neoplasm, unspecified: Secondary | ICD-10-CM

## 2017-01-01 DIAGNOSIS — I2584 Coronary atherosclerosis due to calcified coronary lesion: Secondary | ICD-10-CM | POA: Diagnosis not present

## 2017-01-01 DIAGNOSIS — D631 Anemia in chronic kidney disease: Secondary | ICD-10-CM | POA: Diagnosis not present

## 2017-01-01 DIAGNOSIS — N2581 Secondary hyperparathyroidism of renal origin: Secondary | ICD-10-CM | POA: Diagnosis not present

## 2017-01-01 DIAGNOSIS — E118 Type 2 diabetes mellitus with unspecified complications: Secondary | ICD-10-CM

## 2017-01-01 DIAGNOSIS — E1122 Type 2 diabetes mellitus with diabetic chronic kidney disease: Secondary | ICD-10-CM | POA: Diagnosis present

## 2017-01-01 DIAGNOSIS — J9811 Atelectasis: Secondary | ICD-10-CM | POA: Diagnosis present

## 2017-01-01 DIAGNOSIS — Z833 Family history of diabetes mellitus: Secondary | ICD-10-CM

## 2017-01-01 DIAGNOSIS — M1990 Unspecified osteoarthritis, unspecified site: Secondary | ICD-10-CM | POA: Diagnosis present

## 2017-01-01 DIAGNOSIS — R69 Illness, unspecified: Secondary | ICD-10-CM | POA: Diagnosis not present

## 2017-01-01 DIAGNOSIS — R1310 Dysphagia, unspecified: Secondary | ICD-10-CM | POA: Diagnosis not present

## 2017-01-01 DIAGNOSIS — Z7982 Long term (current) use of aspirin: Secondary | ICD-10-CM

## 2017-01-01 DIAGNOSIS — A419 Sepsis, unspecified organism: Secondary | ICD-10-CM | POA: Diagnosis not present

## 2017-01-01 DIAGNOSIS — Z888 Allergy status to other drugs, medicaments and biological substances status: Secondary | ICD-10-CM | POA: Diagnosis not present

## 2017-01-01 DIAGNOSIS — E1121 Type 2 diabetes mellitus with diabetic nephropathy: Secondary | ICD-10-CM | POA: Diagnosis not present

## 2017-01-01 DIAGNOSIS — I739 Peripheral vascular disease, unspecified: Secondary | ICD-10-CM | POA: Diagnosis present

## 2017-01-01 DIAGNOSIS — R488 Other symbolic dysfunctions: Secondary | ICD-10-CM | POA: Diagnosis not present

## 2017-01-01 DIAGNOSIS — E119 Type 2 diabetes mellitus without complications: Secondary | ICD-10-CM | POA: Diagnosis not present

## 2017-01-01 DIAGNOSIS — I5032 Chronic diastolic (congestive) heart failure: Secondary | ICD-10-CM | POA: Diagnosis not present

## 2017-01-01 DIAGNOSIS — E11621 Type 2 diabetes mellitus with foot ulcer: Secondary | ICD-10-CM | POA: Diagnosis present

## 2017-01-01 DIAGNOSIS — M1A9XX0 Chronic gout, unspecified, without tophus (tophi): Secondary | ICD-10-CM | POA: Diagnosis not present

## 2017-01-01 DIAGNOSIS — R4182 Altered mental status, unspecified: Secondary | ICD-10-CM | POA: Diagnosis present

## 2017-01-01 DIAGNOSIS — L97909 Non-pressure chronic ulcer of unspecified part of unspecified lower leg with unspecified severity: Secondary | ICD-10-CM | POA: Diagnosis present

## 2017-01-01 DIAGNOSIS — I700 Atherosclerosis of aorta: Secondary | ICD-10-CM | POA: Diagnosis present

## 2017-01-01 DIAGNOSIS — Z951 Presence of aortocoronary bypass graft: Secondary | ICD-10-CM

## 2017-01-01 DIAGNOSIS — I10 Essential (primary) hypertension: Secondary | ICD-10-CM | POA: Diagnosis present

## 2017-01-01 DIAGNOSIS — I70261 Atherosclerosis of native arteries of extremities with gangrene, right leg: Secondary | ICD-10-CM | POA: Diagnosis not present

## 2017-01-01 DIAGNOSIS — E875 Hyperkalemia: Secondary | ICD-10-CM | POA: Diagnosis not present

## 2017-01-01 LAB — RENAL FUNCTION PANEL
Albumin: 3.6 g/dL (ref 3.5–5.0)
Anion gap: 19 — ABNORMAL HIGH (ref 5–15)
BUN: 21 mg/dL — ABNORMAL HIGH (ref 6–20)
CO2: 22 mmol/L (ref 22–32)
Calcium: 9.7 mg/dL (ref 8.9–10.3)
Chloride: 92 mmol/L — ABNORMAL LOW (ref 101–111)
Creatinine, Ser: 8.8 mg/dL — ABNORMAL HIGH (ref 0.61–1.24)
GFR calc Af Amer: 6 mL/min — ABNORMAL LOW (ref >60)
GFR calc non Af Amer: 5 mL/min — ABNORMAL LOW (ref >60)
Glucose, Bld: 97 mg/dL (ref 65–99)
Phosphorus: 5.2 mg/dL — ABNORMAL HIGH (ref 2.5–4.6)
Potassium: 5.2 mmol/L — ABNORMAL HIGH (ref 3.5–5.1)
Sodium: 133 mmol/L — ABNORMAL LOW (ref 135–145)

## 2017-01-01 LAB — BASIC METABOLIC PANEL
Anion gap: 14 (ref 5–15)
BUN: 44 mg/dL — ABNORMAL HIGH (ref 6–20)
CO2: 27 mmol/L (ref 22–32)
Calcium: 9.3 mg/dL (ref 8.9–10.3)
Chloride: 93 mmol/L — ABNORMAL LOW (ref 101–111)
Creatinine, Ser: 13.98 mg/dL — ABNORMAL HIGH (ref 0.61–1.24)
GFR calc Af Amer: 3 mL/min — ABNORMAL LOW (ref >60)
GFR calc non Af Amer: 3 mL/min — ABNORMAL LOW (ref >60)
Glucose, Bld: 107 mg/dL — ABNORMAL HIGH (ref 65–99)
Potassium: 5.4 mmol/L — ABNORMAL HIGH (ref 3.5–5.1)
Sodium: 134 mmol/L — ABNORMAL LOW (ref 135–145)

## 2017-01-01 LAB — PROTIME-INR
INR: 1.1
Prothrombin Time: 14.3 seconds (ref 11.4–15.2)

## 2017-01-01 LAB — CBC WITH DIFFERENTIAL/PLATELET
Basophils Absolute: 0 10*3/uL (ref 0.0–0.1)
Basophils Relative: 0 %
Eosinophils Absolute: 0.3 10*3/uL (ref 0.0–0.7)
Eosinophils Relative: 3 %
HCT: 33.8 % — ABNORMAL LOW (ref 39.0–52.0)
Hemoglobin: 11 g/dL — ABNORMAL LOW (ref 13.0–17.0)
Lymphocytes Relative: 11 %
Lymphs Abs: 0.9 10*3/uL (ref 0.7–4.0)
MCH: 31.7 pg (ref 26.0–34.0)
MCHC: 32.5 g/dL (ref 30.0–36.0)
MCV: 97.4 fL (ref 78.0–100.0)
Monocytes Absolute: 0.8 10*3/uL (ref 0.1–1.0)
Monocytes Relative: 10 %
Neutro Abs: 6.3 10*3/uL (ref 1.7–7.7)
Neutrophils Relative %: 76 %
Platelets: 208 10*3/uL (ref 150–400)
RBC: 3.47 MIL/uL — ABNORMAL LOW (ref 4.22–5.81)
RDW: 14.8 % (ref 11.5–15.5)
WBC: 8.3 10*3/uL (ref 4.0–10.5)

## 2017-01-01 LAB — MAGNESIUM
Magnesium: 2.9 mg/dL — ABNORMAL HIGH (ref 1.7–2.4)
Magnesium: 2.5 mg/dL — ABNORMAL HIGH (ref 1.7–2.4)

## 2017-01-01 LAB — PROCALCITONIN: Procalcitonin: 1.12 ng/mL

## 2017-01-01 LAB — CBC
HCT: 40.7 % (ref 39.0–52.0)
Hemoglobin: 13.3 g/dL (ref 13.0–17.0)
MCH: 31.9 pg (ref 26.0–34.0)
MCHC: 32.7 g/dL (ref 30.0–36.0)
MCV: 97.6 fL (ref 78.0–100.0)
Platelets: 207 K/uL (ref 150–400)
RBC: 4.17 MIL/uL — ABNORMAL LOW (ref 4.22–5.81)
RDW: 14.9 % (ref 11.5–15.5)
WBC: 7.6 K/uL (ref 4.0–10.5)

## 2017-01-01 LAB — LACTIC ACID, PLASMA
LACTIC ACID, VENOUS: 0.9 mmol/L (ref 0.5–1.9)
Lactic Acid, Venous: 2.3 mmol/L (ref 0.5–1.9)

## 2017-01-01 LAB — APTT: APTT: 34 s (ref 24–36)

## 2017-01-01 LAB — PHOSPHORUS: Phosphorus: 5.2 mg/dL — ABNORMAL HIGH (ref 2.5–4.6)

## 2017-01-01 SURGERY — ABDOMINAL AORTOGRAM W/LOWER EXTREMITY
Anesthesia: LOCAL

## 2017-01-01 MED ORDER — SUCROFERRIC OXYHYDROXIDE 500 MG PO CHEW
1000.0000 mg | CHEWABLE_TABLET | Freq: Three times a day (TID) | ORAL | Status: DC
  Administered 2017-01-02 – 2017-01-07 (×13): 1000 mg via ORAL
  Filled 2017-01-01 (×21): qty 2

## 2017-01-01 MED ORDER — ALTEPLASE 2 MG IJ SOLR: 2.0000 mg | Freq: Once | INTRAMUSCULAR | Status: DC | PRN

## 2017-01-01 MED ORDER — MORPHINE SULFATE (PF) 4 MG/ML IV SOLN
INTRAVENOUS | Status: AC
  Filled 2017-01-01: qty 1

## 2017-01-01 MED ORDER — PIPERACILLIN-TAZOBACTAM 3.375 G IVPB
3.3750 g | Freq: Two times a day (BID) | INTRAVENOUS | Status: DC
  Administered 2017-01-02 – 2017-01-07 (×11): 3.375 g via INTRAVENOUS
  Filled 2017-01-01 (×13): qty 50

## 2017-01-01 MED ORDER — OXYCODONE HCL 5 MG PO TABS
ORAL_TABLET | ORAL | Status: AC
  Filled 2017-01-01: qty 2

## 2017-01-01 MED ORDER — SODIUM CHLORIDE 0.9 % IV SOLN: 100.0000 mL | INTRAVENOUS | Status: DC | PRN

## 2017-01-01 MED ORDER — LIDOCAINE-PRILOCAINE 2.5-2.5 % EX CREA: 1.0000 "application " | TOPICAL_CREAM | CUTANEOUS | Status: DC | PRN

## 2017-01-01 MED ORDER — SODIUM CHLORIDE 0.9% FLUSH
3.0000 mL | Freq: Two times a day (BID) | INTRAVENOUS | Status: DC
  Administered 2017-01-01 – 2017-01-02 (×2): 3 mL via INTRAVENOUS
  Administered 2017-01-04: 10 mL via INTRAVENOUS
  Administered 2017-01-04 – 2017-01-06 (×2): 3 mL via INTRAVENOUS

## 2017-01-01 MED ORDER — ALLOPURINOL 100 MG PO TABS
100.0000 mg | ORAL_TABLET | Freq: Every evening | ORAL | Status: DC
  Administered 2017-01-01 – 2017-01-07 (×7): 100 mg via ORAL
  Filled 2017-01-01 (×7): qty 1

## 2017-01-01 MED ORDER — MORPHINE SULFATE (PF) 4 MG/ML IV SOLN
1.0000 mg | INTRAVENOUS | Status: DC | PRN
  Administered 2017-01-01: 4 mg via INTRAVENOUS
  Administered 2017-01-02 – 2017-01-03 (×2): 2 mg via INTRAVENOUS
  Filled 2017-01-01 (×2): qty 1

## 2017-01-01 MED ORDER — ACETAMINOPHEN 650 MG RE SUPP: 650.0000 mg | Freq: Four times a day (QID) | RECTAL | Status: DC | PRN

## 2017-01-01 MED ORDER — HEPARIN SODIUM (PORCINE) 5000 UNIT/ML IJ SOLN
5000.0000 [IU] | Freq: Three times a day (TID) | INTRAMUSCULAR | Status: DC
  Administered 2017-01-02 – 2017-01-03 (×3): 5000 [IU] via SUBCUTANEOUS
  Filled 2017-01-01 (×4): qty 1

## 2017-01-01 MED ORDER — VANCOMYCIN HCL IN DEXTROSE 1-5 GM/200ML-% IV SOLN
1000.0000 mg | Freq: Once | INTRAVENOUS | Status: AC
  Administered 2017-01-01: 1000 mg via INTRAVENOUS

## 2017-01-01 MED ORDER — HEPARIN SODIUM (PORCINE) 1000 UNIT/ML DIALYSIS
7500.0000 [IU] | Freq: Once | INTRAMUSCULAR | Status: AC
  Administered 2017-01-01: 7500 [IU] via INTRAVENOUS_CENTRAL

## 2017-01-01 MED ORDER — VANCOMYCIN HCL IN DEXTROSE 1-5 GM/200ML-% IV SOLN
INTRAVENOUS | Status: AC
  Administered 2017-01-01: 1000 mg via INTRAVENOUS
  Filled 2017-01-01: qty 200

## 2017-01-01 MED ORDER — ACETAMINOPHEN 325 MG PO TABS: 650.0000 mg | ORAL_TABLET | Freq: Four times a day (QID) | ORAL | Status: DC | PRN

## 2017-01-01 MED ORDER — HEPARIN SODIUM (PORCINE) 1000 UNIT/ML DIALYSIS: 1000.0000 [IU] | INTRAMUSCULAR | Status: DC | PRN

## 2017-01-01 MED ORDER — SUCROFERRIC OXYHYDROXIDE 500 MG PO CHEW: 500.0000 mg | CHEWABLE_TABLET | Freq: Two times a day (BID) | ORAL | Status: DC

## 2017-01-01 MED ORDER — CYANOCOBALAMIN 500 MCG PO TABS
250.0000 ug | ORAL_TABLET | Freq: Every evening | ORAL | Status: DC
  Administered 2017-01-01 – 2017-01-07 (×6): 250 ug via ORAL
  Filled 2017-01-01 (×9): qty 1

## 2017-01-01 MED ORDER — PIPERACILLIN-TAZOBACTAM 3.375 G IVPB 30 MIN: 3.3750 g | Freq: Once | INTRAVENOUS | Status: DC

## 2017-01-01 MED ORDER — TIZANIDINE HCL 2 MG PO TABS
2.0000 mg | ORAL_TABLET | Freq: Four times a day (QID) | ORAL | Status: DC | PRN
  Filled 2017-01-01: qty 1

## 2017-01-01 MED ORDER — PENTAFLUOROPROP-TETRAFLUOROETH EX AERO: 1.0000 "application " | INHALATION_SPRAY | CUTANEOUS | Status: DC | PRN

## 2017-01-01 MED ORDER — LIDOCAINE HCL (PF) 1 % IJ SOLN: 5.0000 mL | INTRAMUSCULAR | Status: DC | PRN

## 2017-01-01 MED ORDER — SODIUM CHLORIDE 0.9 % IV BOLUS (SEPSIS)
500.0000 mL | Freq: Once | INTRAVENOUS | Status: AC
  Administered 2017-01-01: 500 mL via INTRAVENOUS

## 2017-01-01 MED ORDER — OXYCODONE HCL 5 MG PO TABS
5.0000 mg | ORAL_TABLET | ORAL | Status: DC | PRN
  Administered 2017-01-01 – 2017-01-05 (×3): 10 mg via ORAL
  Administered 2017-01-06: 5 mg via ORAL
  Filled 2017-01-01 (×2): qty 1
  Filled 2017-01-01: qty 2

## 2017-01-01 MED ORDER — ASPIRIN EC 81 MG PO TBEC
81.0000 mg | DELAYED_RELEASE_TABLET | Freq: Every evening | ORAL | Status: DC
  Administered 2017-01-01 – 2017-01-07 (×7): 81 mg via ORAL
  Filled 2017-01-01 (×7): qty 1

## 2017-01-01 MED ORDER — VANCOMYCIN HCL IN DEXTROSE 1-5 GM/200ML-% IV SOLN
1000.0000 mg | INTRAVENOUS | Status: DC
  Administered 2017-01-03 – 2017-01-05 (×2): 1000 mg via INTRAVENOUS
  Filled 2017-01-01 (×2): qty 200

## 2017-01-01 NOTE — Progress Notes (Deleted)
CONSULT NOTE   Patient name: [name] MRN: [id number] DOB: [birth year] Sex: male  REASON FOR CONSULT: sepsis, purulent drainage right foot, consult is from Dr. Konrad Dolores  HPI:    [name] is a 81 y.o. male who presents with worsening right foot pain and altered mental status. Was seen by Dr. Lemar Livings recently on 12/28/16 for right toe ulcerations and set up for outpatient arteriogram today. He has been admitted by the hospitalist service for sepsis secondary to cellulitis and right foot infection.  He is also known to [place] from having undergone revision of left forearm loop graft by Dr. Edilia Bo on 11/16/16.   PAST MEDICAL HISTORY:   Past Medical History:  Diagnosis Date  . Anginal pain (HCC)    before CABG  . Arthritis   . CHF (congestive heart failure) (HCC)   . Constipation   . Coronary artery disease   . Diabetes mellitus   . ESRD (end stage renal disease) (HCC)    TuesThurs Sat [place]  . Hypertension   . Myocardial infarction (HCC)   . Pneumonia    2 years ago    Family History  Problem Relation Age of Onset  . Cancer Father   . Diabetes Sister     Social History  Substance Use Topics  . Smoking status: Never Smoker  . Smokeless tobacco: Current User    Types: Snuff  . Alcohol use No    Allergies  Allergen Reactions  . Iohexol Swelling and Rash  . Zanaflex [Tizanidine] Other (See Comments)    Shut kidneys down Unable to walk or void  . Ezetimibe-Simvastatin Other (See Comments)    Elevated liver enzymes  . Fluvastatin Other (See Comments)  . Simvastatin Other (See Comments)    Elevated liver enzymes    MEDICATIONS:   Current Facility-Administered Medications  Medication Dose Route Frequency Provider Last Rate Last Dose  . morphine 4 MG/ML injection 1-4 mg  1-4 mg Intravenous Q2H PRN Russella Dar, NP      . oxyCODONE (Oxy IR/ROXICODONE) immediate release tablet 5-10 mg  5-10 mg Oral Q4H PRN Russella Dar, NP      .  piperacillin-tazobactam (ZOSYN) IVPB 3.375 g  3.375 g Intravenous Once Russella Dar, NP      . sucroferric oxyhydroxide [facility]) chewable tablet 1,000 mg  1,000 mg Oral TID WC Primitivo Gauze, MD      . vancomycin (VANCOCIN) IVPB 1000 mg/200 mL premix  1,000 mg Intravenous Once Russella Dar, NP        REVIEW OF SYSTEMS:   REVIEW OF SYSTEMS (negative unless checked):   Cardiac:  []  Chest pain or chest pressure? []  Shortness of breath upon activity? []  Shortness of breath when lying flat? []  Irregular heart rhythm?  Vascular:  []  Pain in calf, thigh, or hip brought on by walking? []  Pain in feet at night that wakes you up from your sleep? []  Blood clot in your veins? []  Leg swelling?  Pulmonary:  []  Oxygen at home? []  Productive cough? []  Wheezing?  Neurologic:  []  Sudden weakness in arms or legs? []  Sudden numbness in arms or legs? []  Sudden onset of difficult speaking or slurred speech? []  Temporary loss of vision in one eye? []  Problems with dizziness?  Gastrointestinal:  []  Blood in stool? []  Vomited blood?  Genitourinary:  []  Burning when urinating? []  Blood in urine?  Psychiatric:  []  Major depression  Hematologic:  []  Bleeding problems? []  Problems  with blood clotting?  Dermatologic:  []  Rashes or ulcers?  Constitutional:  []  Fever or chills?  Ear/Nose/Throat:  []  Change in hearing? []  Nose bleeds? []  Sore throat?  Musculoskeletal:  []  Back pain? []  Joint pain? []  Muscle pain?  PHYSICAL EXAM:   Vitals:   01/01/17 1419 01/01/17 1425 01/01/17 1430 01/01/17 1500  BP: (!) 128/54 (!) 106/45 (!) 108/54 (!) 93/49  Pulse: 77 74 73 75  Resp: 15 14 12 11   Temp: 98.1 F (36.7 C)     TempSrc: Oral     SpO2: 91%     Weight:      Height:        GENERAL: The patient is a well-nourished male, in no acute distress. He is seen in HD. The vital signs are documented above. HEENT: normocephalic, atraumatic. No abnormalities noted.    CARDIAC: There is a regular rate and rhythm.  VASCULAR: Non palpable pedal pulses bilaterally. Right foot is swollen and erythematous with purulent drainage between right 4th and 5th toes.    PULMONARY: Non labored respiratory effort.  MUSCULOSKELETAL: There are no major deformities or cyanosis. NEUROLOGIC: No focal weakness or paresthesias are detected.   Laboratory   CBC CBC Latest Ref Rng & Units 01/01/2017 12/29/2016 12/28/2016  WBC 4.0 - 10.5 K/uL 8.3 6.1 6.5  Hemoglobin 13.0 - 17.0 g/dL 11.0(L) 10.4(L) 9.5(L)  Hematocrit 39.0 - 52.0 % 33.8(L) 31.5(L) 28.4(L)  Platelets 150 - 400 K/uL 208 149(L) 139(L)    BMP BMP Latest Ref Rng & Units 01/01/2017 12/29/2016 12/28/2016  Glucose 65 - 99 mg/dL 045(W107(H) 098(J102(H) 80  BUN 6 - 20 mg/dL 19(J44(H) 47(W34(H) 29(F45(H)  Creatinine 0.61 - 1.24 mg/dL 62.13(Y13.98(H) 86.57(Q12.18(H) 46.96(E14.45(H)  Sodium 135 - 145 mmol/L 134(L) 135 134(L)  Potassium 3.5 - 5.1 mmol/L 5.4(H) 4.6 5.3(H)  Chloride 101 - 111 mmol/L 93(L) 94(L) 94(L)  CO2 22 - 32 mmol/L 27 27 23   Calcium 8.9 - 10.3 mg/dL 9.3 9.3 9.5(M8.7(L)    Coagulation Lab Results  Component Value Date   INR 1.10 01/01/2017   INR 1.3 12/04/2008   INR 1.0 08/21/2007   No results found for: PTT  Lipids    Component Value Date/Time   CHOL  12/04/2008 2255    142        ATP III CLASSIFICATION:  <200     mg/dL   Desirable  841-324200-239  mg/dL   Borderline High  >=401>=240    mg/dL   High          TRIG 77 12/04/2008 2255   HDL 50 12/04/2008 2255   CHOLHDL 2.8 12/04/2008 2255   VLDL 15 12/04/2008 2255   LDLCALC  12/04/2008 2255    77        Total Cholesterol/HDL:CHD Risk Coronary Heart Disease Risk Table                     Men   Women  1/2 Average Risk   3.4   3.3  Average Risk       5.0   4.4  2 X Average Risk   9.6   7.1  3 X Average Risk  23.4   11.0        Use the calculated Patient Ratio above and the CHD Risk Table to determine the patient's CHD Risk.        ATP III CLASSIFICATION (LDL):  <100     mg/dL    Optimal  027-253100-129  mg/dL  Near or Above                    Optimal  130-159  mg/dL   Borderline  161-096  mg/dL   High  >045     mg/dL   Very High     ASSESSMENT/PLAN:   Right foot infection and cellulitis  Continue IV abx. Will set up for angiogram this week to evaluate blood flow to right leg and necessary level of amputation.    Maris Berger, PA-C [facility]  Addendum  I have independently interviewed and examined the patient, and I agree with the physician assistant's findings.  Pt has spontaneous drainage of pus from R 5th > 4th toe with associated cellulitis.  Pt's presentation is not consistent with frank sepsis so I doubt he needs emergent guillotine amputation.    - Broad spectrum IV abx - Aquacel Ag to R foot to help absorb any drainage and sterilize the wound - Needs angiogram to determine any revascularization options and/or level of viable amputation - Suspect pt minimally needs transmetatarsal amputation 3-5th toes.  If infection has spread to mid-foot, he may need R BKA vs AKA - Dr. Randie Heinz to see patient in AM   Leonides Sake, MD, FACS [facility] [place] Office: [phone] Pager: [phone]  01/01/2017, 3:47 PM

## 2017-01-01 NOTE — Consult Note (Addendum)
CONSULT NOTE   Patient name: [name] MRN: [id number] DOB: [birth year] Sex: male  REASON FOR CONSULT: sepsis, purulent drainage right foot, consult is from Dr. Konrad Dolores  HPI:    [name] is a 81 y.o. male who presents with worsening right foot pain and altered mental status. Was seen by Dr. Lemar Livings recently on 12/28/16 for right toe ulcerations and set up for outpatient arteriogram today. He has been admitted by the hospitalist service for sepsis secondary to cellulitis and right foot infection.  He is also known to [place] from having undergone revision of left forearm loop graft by Dr. Edilia Bo on 11/16/16.   PAST MEDICAL HISTORY:   Past Medical History:  Diagnosis Date  . Anginal pain (HCC)    before CABG  . Arthritis   . CHF (congestive heart failure) (HCC)   . Constipation   . Coronary artery disease   . Diabetes mellitus   . ESRD (end stage renal disease) (HCC)    TuesThurs Sat [place]  . Hypertension   . Myocardial infarction (HCC)   . Pneumonia    2 years ago    Family History  Problem Relation Age of Onset  . Cancer Father   . Diabetes Sister     Social History  Substance Use Topics  . Smoking status: Never Smoker  . Smokeless tobacco: Current User    Types: Snuff  . Alcohol use No    Allergies  Allergen Reactions  . Iohexol Swelling and Rash  . Zanaflex [Tizanidine] Other (See Comments)    Shut kidneys down Unable to walk or void  . Ezetimibe-Simvastatin Other (See Comments)    Elevated liver enzymes  . Fluvastatin Other (See Comments)  . Simvastatin Other (See Comments)    Elevated liver enzymes    MEDICATIONS:   Current Facility-Administered Medications  Medication Dose Route Frequency Provider Last Rate Last Dose  . morphine 4 MG/ML injection 1-4 mg  1-4 mg Intravenous Q2H PRN Russella Dar, NP      . oxyCODONE (Oxy IR/ROXICODONE) immediate release tablet 5-10 mg  5-10 mg Oral Q4H PRN Russella Dar, NP      .  piperacillin-tazobactam (ZOSYN) IVPB 3.375 g  3.375 g Intravenous Once Russella Dar, NP      . sucroferric oxyhydroxide [facility]) chewable tablet 1,000 mg  1,000 mg Oral TID WC Primitivo Gauze, MD      . vancomycin (VANCOCIN) IVPB 1000 mg/200 mL premix  1,000 mg Intravenous Once Russella Dar, NP        REVIEW OF SYSTEMS:   REVIEW OF SYSTEMS (negative unless checked):   Cardiac:  []  Chest pain or chest pressure? []  Shortness of breath upon activity? []  Shortness of breath when lying flat? []  Irregular heart rhythm?  Vascular:  []  Pain in calf, thigh, or hip brought on by walking? []  Pain in feet at night that wakes you up from your sleep? []  Blood clot in your veins? []  Leg swelling?  Pulmonary:  []  Oxygen at home? []  Productive cough? []  Wheezing?  Neurologic:  []  Sudden weakness in arms or legs? []  Sudden numbness in arms or legs? []  Sudden onset of difficult speaking or slurred speech? []  Temporary loss of vision in one eye? []  Problems with dizziness?  Gastrointestinal:  []  Blood in stool? []  Vomited blood?  Genitourinary:  []  Burning when urinating? []  Blood in urine?  Psychiatric:  []  Major depression  Hematologic:  []  Bleeding problems? []  Problems  with blood clotting?  Dermatologic:  []  Rashes or ulcers?  Constitutional:  []  Fever or chills?  Ear/Nose/Throat:  []  Change in hearing? []  Nose bleeds? []  Sore throat?  Musculoskeletal:  []  Back pain? []  Joint pain? []  Muscle pain?  PHYSICAL EXAM:   Vitals:   01/01/17 1419 01/01/17 1425 01/01/17 1430 01/01/17 1500  BP: (!) 128/54 (!) 106/45 (!) 108/54 (!) 93/49  Pulse: 77 74 73 75  Resp: 15 14 12 11   Temp: 98.1 F (36.7 C)     TempSrc: Oral     SpO2: 91%     Weight:      Height:        GENERAL: The patient is a well-nourished male, in no acute distress. He is seen in HD. The vital signs are documented above. HEENT: normocephalic, atraumatic. No abnormalities noted.    CARDIAC: There is a regular rate and rhythm.  VASCULAR: Non palpable pedal pulses bilaterally. Right foot is swollen and erythematous with purulent drainage between right 4th and 5th toes.    PULMONARY: Non labored respiratory effort.  MUSCULOSKELETAL: There are no major deformities or cyanosis. NEUROLOGIC: No focal weakness or paresthesias are detected.   Laboratory   CBC CBC Latest Ref Rng & Units 01/01/2017 12/29/2016 12/28/2016  WBC 4.0 - 10.5 K/uL 8.3 6.1 6.5  Hemoglobin 13.0 - 17.0 g/dL 11.0(L) 10.4(L) 9.5(L)  Hematocrit 39.0 - 52.0 % 33.8(L) 31.5(L) 28.4(L)  Platelets 150 - 400 K/uL 208 149(L) 139(L)    BMP BMP Latest Ref Rng & Units 01/01/2017 12/29/2016 12/28/2016  Glucose 65 - 99 mg/dL 960(A107(H) 540(J102(H) 80  BUN 6 - 20 mg/dL 81(X44(H) 91(Y34(H) 78(G45(H)  Creatinine 0.61 - 1.24 mg/dL 95.62(Z13.98(H) 30.86(V12.18(H) 78.46(N14.45(H)  Sodium 135 - 145 mmol/L 134(L) 135 134(L)  Potassium 3.5 - 5.1 mmol/L 5.4(H) 4.6 5.3(H)  Chloride 101 - 111 mmol/L 93(L) 94(L) 94(L)  CO2 22 - 32 mmol/L 27 27 23   Calcium 8.9 - 10.3 mg/dL 9.3 9.3 6.2(X8.7(L)    Coagulation Lab Results  Component Value Date   INR 1.10 01/01/2017   INR 1.3 12/04/2008   INR 1.0 08/21/2007   No results found for: PTT  Lipids    Component Value Date/Time   CHOL  12/04/2008 2255    142        ATP III CLASSIFICATION:  <200     mg/dL   Desirable  528-413200-239  mg/dL   Borderline High  >=244>=240    mg/dL   High          TRIG 77 12/04/2008 2255   HDL 50 12/04/2008 2255   CHOLHDL 2.8 12/04/2008 2255   VLDL 15 12/04/2008 2255   LDLCALC  12/04/2008 2255    77        Total Cholesterol/HDL:CHD Risk Coronary Heart Disease Risk Table                     Men   Women  1/2 Average Risk   3.4   3.3  Average Risk       5.0   4.4  2 X Average Risk   9.6   7.1  3 X Average Risk  23.4   11.0        Use the calculated Patient Ratio above and the CHD Risk Table to determine the patient's CHD Risk.        ATP III CLASSIFICATION (LDL):  <100     mg/dL    Optimal  010-272100-129  mg/dL  Near or Above                    Optimal  130-159  mg/dL   Borderline  161-096  mg/dL   High  >045     mg/dL   Very High     ASSESSMENT/PLAN:   Right foot infection and cellulitis  Continue IV abx. Will set up for angiogram this week to evaluate blood flow to right leg and necessary level of amputation.    Maris Berger, PA-C [facility] [place]  Addendum  I have independently interviewed and examined the patient, and I agree with the physician assistant's findings.  Pt has spontaneous drainage of pus from R 5th > 4th toe with associated cellulitis.  Pt's presentation is not consistent with frank sepsis so I doubt he needs emergent guillotine amputation.    - Broad spectrum IV abx - Aquacel Ag to R foot to help absorb any drainage and sterilize the wound - Needs angiogram to determine any revascularization options and/or level of viable amputation.  Currently scheduled for Thursday - Suspect pt minimally needs transmetatarsal amputation 3-5th toes.  If infection has spread to mid-foot, he may need R BKA vs AKA - Dr. Randie Heinz to see patient in AM   Leonides Sake, MD, FACS [facility] [place] Office: [phone] Pager: [phone]  01/01/2017, 3:47 PM

## 2017-01-01 NOTE — Procedures (Signed)
Pt seen on HD.  HD just getting started.  Plan to pull 3L.  SBP 120's.

## 2017-01-01 NOTE — ED Notes (Signed)
Report to Dane in HD

## 2017-01-01 NOTE — Clinical Social Work Note (Signed)
Clinical Social Work Assessment  Patient Details  Name: [name] MRN: [id number] Date of Birth: 03/29/1936  Date of referral:  01/01/17               Reason for consult:  Facility Placement                Permission sought to share information with:  Family Supports Permission granted to share information::  Yes, Verbal Permission Granted  Name::     [name] (Spouse)   Agency::     Relationship::     Contact Information:     Housing/Transportation Living arrangements for the past 2 months:  Single Family Home (with wife [name]) Source of Information:  Patient Patient Interpreter Needed:  None Criminal Activity/Legal Involvement Pertinent to Current Situation/Hospitalization:  No - Comment as needed Significant Relationships:  Adult Children, Spouse Lives with:  Spouse Do you feel safe going back to the place where you live?  Yes Need for family participation in patient care:  Yes (Comment)  Care giving concerns:  CSW spoke with pt at bedside. At this time the concern for pt is not having someone who can take care of pt as needed.    Social Worker assessment / plan:  CSW spoke with pt, pt's wife [name]([name]), and son [name]([name]) at bedside. During this time CSW was informed that pt has been living at home with wife. Per pt wife [name] is the person who has been taking care of pt needs thus far, however pt feels that pt is requiring more help considering being a dialysis pt. Pt reports that pt has the support of pt's children and wife but that is about all at this time. CSW asked pt about the possibility of rehab and pt was a nit sarcastic about this.   Employment status:  Retired Database administratornsurance information:  Managed Medicare PT Recommendations:  Not assessed at this time Information / Referral to community resources:  Skilled Nursing Facility  Patient/Family's Response to care:  Pt and family appear to be understanding of plan of care at this time.   Patient/Family's Understanding of  and Emotional Response to Diagnosis, Current Treatment, and Prognosis:  No further questions or concerns have been presented to CSW at this time.   Emotional Assessment Appearance:  Appears stated age Attitude/Demeanor/Rapport:  Other Affect (typically observed):  Calm Orientation:  Oriented to Self, Oriented to Place, Oriented to  Time, Oriented to Situation Alcohol / Substance use:  Not Applicable Psych involvement (Current and /or in the community):  No (Comment)  Discharge Needs  Concerns to be addressed:  No discharge needs identified Readmission within the last 30 days:  Yes Current discharge risk:  None Barriers to Discharge:  No Barriers Identified   Robb MatarKierra S Verdis Koval, LCSWA 01/01/2017, 1:44 PM

## 2017-01-01 NOTE — Progress Notes (Signed)
Patient received to room. Only oriented to self, very drowsy. No IV access, Iv team made one unsuccessful attempt, will be back with the ultrasound. Tele monitoring called in, Bed alarm on.

## 2017-01-01 NOTE — ED Triage Notes (Addendum)
Pt arrives EMS from home with c/o weakness since he was [facility] sat[facility]. Unable to walk since Sunday. C/io pain at right toes x 1 month. On arrival. Pt was told he had appointment today for Cath at 1200.

## 2017-01-01 NOTE — Progress Notes (Signed)
[name] Progress Note  Background: [name] is 81 Y/O AA male with ESRD 2/2 DM on hemodialysis since 11/2007. He has hemodialysis T, Th, S at [facility]. PMH significant for DM, HTN, Gout, partial transverse cololectomy for mass 11/2007. SHPT, AOCD. Patient was recently admitted to hospital for hyperkalemia/volume overload/possible cellulitis R. Foot 08/07-08/11/18. Per his wife, he has not been able to get out of recliner and walk since he was discharged from hospital. Attempted to go to dialysis this AM but wife and son were not able to help pt out of chair. Patient also states he had nausea and vomiting last PM with Turner emesis. Pt states swelling in R foot is "getting better" but still has malodorous, purulent drainage between 3rd and 4th toes, erythema halfway up foot and 2+ pitting RLE edema. Wife called dialysis center and was instructed to bring patient to ED for evaluation. No labs have been done yet. Concern for hyperkalemia D/T excessive weakness. Will order hemodialysis now and check labs stat.    Subjective: "I'm just too weak to walk". Denies SOB, Chest pain. No nausea at present. C/O numbness R foot.   Objective Vitals:   01/01/17 1053 01/01/17 1150  BP: (!) 129/46   Pulse: 76   Resp: 19   Temp: 97.9 F (36.6 C)   TempSrc: Oral   SpO2: 93%   Weight:  109.8 kg (242 lb)  Height:  5\' 10"  (1.778 m)   Physical Exam General: WN,WD elderly AA male in NAD Heart: HS distant, S1,S2, RRR. SR on monitor. Rate 70s.  Lungs: Slightly decreased in bases, otherwise CTAB.  Abdomen: Active BS, non-tender, non-distended.  Extremities: trace LLE edema, 2+pitting RLE. Dried purulent, malodorous drainage between 4th and 5th toes. Faint R pedal pulse. Demarcation noted base of 3rd-5th toes.  Dialysis Access: LFA AVG + bruit  Additional Objective Labs: Basic Metabolic Panel:  Recent Labs Lab 12/27/16 0329 12/28/16 0135 12/29/16 1400  NA 138 134* 135   K 5.0 5.3* 4.6  CL 94* 94* 94*  CO2 25 23 27   GLUCOSE 64* 80 102*  BUN 36* 45* 34*  CREATININE 12.39* 14.45* 12.18*  CALCIUM 8.7* 8.7* 9.3  PHOS  --  6.8* 6.2*   Liver Function Tests:  Recent Labs Lab 12/28/16 0135 12/29/16 1400  ALBUMIN 3.0* 3.0*   No results for input(s): LIPASE, AMYLASE in the last 168 hours. CBC:  Recent Labs Lab 12/25/16 2325 12/27/16 0329 12/28/16 0135 12/29/16 1400  WBC 5.8 5.3 6.5 6.1  HGB 10.4* 9.4* 9.5* 10.4*  HCT 31.6* 28.6* 28.4* 31.5*  MCV 100.0 96.3 93.7 95.2  PLT 150 132* 139* 149*   Blood Culture    Component Value Date/Time   SDES NASAL SWAB 12/26/2016 1714   SPECREQUEST NONE 12/26/2016 1714   CULT NO MRSA DETECTED 12/26/2016 1714   REPTSTATUS 12/28/2016 FINAL 12/26/2016 1714    Cardiac Enzymes: No results for input(s): CKTOTAL, CKMB, CKMBINDEX, TROPONINI in the last 168 hours. CBG:  Recent Labs Lab 12/26/16 0830 12/26/16 1443 12/26/16 1514 12/26/16 1732 12/26/16 2107  GLUCAP 119* 62* 103* 116* 112*   Dialysis Orders: [facility] T,Th,S 4 hrs 15min 180 NRe 450/Auto 1.5 2.0 K/ 2.0 Ca 107 kg  LFA AVG -Heparin 7500 units IV TIW -Calcitriol 0.25 mcg PO TIW -Mircera 60 mcg IV q 2 weeks (recently ordered-no dose given yet) -Parsabiv 2.5 mg IV TIW   Assessment/Plan: 1. Ulceration of R 3rd, 4th toes: Possible critical limb ischemia. Was hospitalized and  treated for RLE cellulitis 08/07-08/11/18. Sent home on Doxy. + RLE edema and erythremia. Scheduled for angiogram for VVS today.  2. Generalized Weakness/Nausea-Unable to ambulate-has been in recliner since discharge from hospital 12/29/16. Check labs in HD. No further vomiting at present. Needs PT consult. Per primary.  3. ESRD-Missed HD today D/T being too weak to go. Will have HD now, check labs. Usual heparin dose.  3. Anemia -Last HGB 10.4 12/29/16. Check labs today.   4. Secondary hyperparathyroidism - Last Phos 6.2 Ca 9.3 C Ca 10.1. Cont. Velphoro for Phos binding, hold  calcitriol. PTH chronically elevated-on Parsivibiv 2.5 IV TIW. Increase dose to 5 mg IV TIW. Hold Calcitriol.  5. HTN/volume -Last HD 12/29/16. No antihypertensive meds. BP controlled. Wt 109.8 kg. Attempt UFG 3-3.5 liters as tolerated.  6. Nutrition - Albumin 3.0. NPO at present.  7. DM: per primary 8. H/O Gout. Cont low dose allopurinol.  9. EKG with prolonged Qtc .58  Ian H. Brown NP-C 01/01/2017, 12:31 PM  [name] [phone] I have seen and examined this patient and agree with plan per Alonna Buckler.  81yo BM just DC from hosp for ischemic Rt foot with plans for arteriogram today.  However, pt has been unable to get out of chair and has been a little confused since DC and thus presented back to ER.  Rt foot ischemic looking with purulent drainage bw 3rd/4th and 4th/5th digits.  VVS to see.  Start empiric AB.  Plan HD today.[name] T,MD 01/01/2017 2:13 PM

## 2017-01-01 NOTE — ED Provider Notes (Signed)
[facility]-EMERGENCY DEPT Provider Note    By signing my name below, I, Earmon Phoenix, attest that this documentation has been prepared under the direction and in the presence of Raeford Razor, MD. Electronically Signed: Earmon Phoenix, ED Scribe. 01/01/17. 12:08 PM.    History   Chief Complaint Chief Complaint  Patient presents with  . Weakness   The history is provided by the patient and medical records. No language interpreter was used.    HPI Comments:  [name] is a 81 y.o. male, brought in by EMS, who presents to the Emergency Department complaining of generalized weakness and fatigue that began this morning. He reports associated malodorous, dark brown vomiting last night that has since resolved. He states he has been having pain in the right foot and his back. He reports numbness to the toes of his right foot and states the swelling of the foot has improved. His wife states he could not stand on his own this morning and has been sitting in his recliner for the past three days. She states he was supposed to have dialysis today but she could not get him there and they instructed her to call an ambulance to bring him to the ED. She states he normally makes urine but has not done so lately. He has not taken anything for symptom relief. There are no modifying factors noted. He denies nausea, SOB, current vomiting, fever, chills. His last complete dialysis was three days ago on Saturday.    Past Medical History:  Diagnosis Date  . Anginal pain (HCC)    before CABG  . Arthritis   . CHF (congestive heart failure) (HCC)   . Constipation   . Coronary artery disease   . Diabetes mellitus   . ESRD (end stage renal disease) (HCC)    TuesThurs Sat [place]  . Hypertension   . Myocardial infarction (HCC)   . Pneumonia    2 years ago    Patient Active Problem List   Diagnosis Date Noted  . Hyperkalemia 12/26/2016  . Ulcer of right foot due to type 2 diabetes mellitus (HCC)  12/26/2016  . Cellulitis of right foot 12/26/2016  . Noncompliance of patient with renal dialysis (HCC) 12/26/2016  . End stage renal disease (HCC) 09/09/2013    Past Surgical History:  Procedure Laterality Date  . A/V FISTULAGRAM Left 10/31/2016   Procedure: A/V Fistulagram;  Surgeon: Maeola Harman, MD;  Location: [facility] INVASIVE CV LAB;  Service: Cardiovascular;  Laterality: Left;  . ARTERIOVENOUS GRAFT PLACEMENT Left   . BACK SURGERY    . CORONARY ARTERY BYPASS GRAFT    . LAPAROSCOPIC COLON RESECTION     growth  . REVISION OF ARTERIOVENOUS GORETEX GRAFT Left 11/16/2016   Procedure: REVISION OF ARTERIOVENOUS GORETEX GRAFT;  Surgeon: Chuck Hint, MD;  Location: [facility] OR;  Service: Vascular;  Laterality: Left;  . REVISON OF ARTERIOVENOUS FISTULA Left 09/11/2013   Procedure: REVISON OF ARTERIOVENOUS FISTULA-LEFT ARM; POSSIBLE VENOPLASTY WITH INTEROPERATIVE FISTULOGRAM;  Surgeon: Chuck Hint, MD;  Location: [facility] OR;  Service: Vascular;  Laterality: Left;       Home Medications    Prior to Admission medications   Medication Sig Start Date End Date Taking? Authorizing Provider  allopurinol (ZYLOPRIM) 100 MG tablet Take 100 mg by mouth every evening.     [provider]  amoxicillin-clavulanate (AUGMENTIN) 500-125 MG tablet Take 1 tablet (500 mg total) by mouth daily. 12/29/16 01/08/17  Alwyn Ren, MD  aspirin EC  81 MG tablet Take 81 mg by mouth every evening.     [provider]  ipratropium (ATROVENT) 0.06 % nasal spray Place 1 spray into both nostrils at bedtime.  09/09/13   [provider]  meclizine (ANTIVERT) 25 MG tablet Take 25 mg by mouth daily.    [provider]  multivitamin (RENA-VIT) TABS tablet Take 1 tablet by mouth every evening.     [provider]  nitroGLYCERIN (NITROSTAT) 0.4 MG SL tablet Place 0.4 mg under the tongue every 5 (five) minutes x 3 doses as needed for chest pain. 10/10/16   [provider]  traMADol (ULTRAM) 50 MG tablet Take 50 mg by mouth every 8 (eight) hours as needed for moderate pain.    [provider]  vitamin B-12 (CYANOCOBALAMIN) 250 MCG tablet Take 250 mcg by mouth every evening.     [provider]    Family History Family History  Problem Relation Age of Onset  . Cancer Father   . Diabetes Sister     Social History Social History  Substance Use Topics  . Smoking status: Never Smoker  . Smokeless tobacco: Current User    Types: Snuff  . Alcohol use No     Allergies   Iohexol; Zanaflex [tizanidine]; Ezetimibe-simvastatin; Fluvastatin; and Simvastatin   Review of Systems Review of Systems All other systems reviewed and are negative for acute change except as noted in the HPI.   Physical Exam Updated Vital Signs BP (!) 129/46 (BP Location: Right Arm)   Pulse 76   Temp 97.9 F (36.6 C) (Oral)   Resp 19   Ht 5\' 10"  (1.778 m)   Wt 242 lb (109.8 kg)   SpO2 93%   BMI 34.72 kg/m   Physical Exam  Constitutional: He is oriented to person, place, and time. He appears well-developed and well-nourished.  Appears tired but non toxic.  HENT:  Head: Normocephalic.  Eyes: EOM are normal.  Neck: Normal range of motion.  Cardiovascular: Normal rate, regular rhythm and normal heart sounds.   Cannot feel DP pulse in bilateral feet but both are warm to touch.  Pulmonary/Chest: Effort normal. He has decreased breath sounds.  Diminished breath sounds bilaterally.  Abdominal: He exhibits no distension.  Musculoskeletal: Normal range of motion. He exhibits edema.  Severe symmetric pitting lower extremity edema. Maceration in web spacing of toes of right foot. Faint erythema extending to midfoot. AV fistula in left forearm. Palpable thrill.  Neurological: He is alert and oriented to person, place, and time.  Psychiatric: He has a normal mood and affect.  Nursing note and vitals reviewed.    ED Treatments / Results    DIAGNOSTIC STUDIES: Oxygen Saturation is 93% on RA, low by my interpretation.   COORDINATION OF CARE: 12:03 PM- Will order labs. Nephrology PA, Orlean Patten at bedside during exam. Pt verbalizes understanding and agrees to plan.  Medications - No data to display  Labs (all labs ordered are listed, but only abnormal results are displayed) Labs Reviewed  BASIC METABOLIC PANEL - Abnormal; Notable for the following:       Result Value   Sodium 134 (*)    Potassium 5.4 (*)    Chloride 93 (*)    Glucose, Bld 107 (*)    BUN 44 (*)    Creatinine, Ser 13.98 (*)    GFR calc non Af Amer 3 (*)    GFR calc Af Amer 3 (*)    All other  components within normal limits  CBC WITH DIFFERENTIAL/PLATELET - Abnormal; Notable for the following:    RBC 3.47 (*)    Hemoglobin 11.0 (*)    HCT 33.8 (*)    All other components within normal limits  MAGNESIUM - Abnormal; Notable for the following:    Magnesium 2.9 (*)    All other components within normal limits  LACTIC ACID, PLASMA - Abnormal; Notable for the following:    Lactic Acid, Venous 2.3 (*)    All other components within normal limits  RENAL FUNCTION PANEL - Abnormal; Notable for the following:    Sodium 133 (*)    Potassium 5.2 (*)    Chloride 92 (*)    BUN 21 (*)    Creatinine, Ser 8.80 (*)    Phosphorus 5.2 (*)    GFR calc non Af Amer 5 (*)    GFR calc Af Amer 6 (*)    Anion gap 19 (*)    All other components within normal limits  CBC - Abnormal; Notable for the following:    RBC 4.17 (*)    All other components within normal limits  MAGNESIUM - Abnormal; Notable for the following:    Magnesium 2.5 (*)    All other components within normal limits  PHOSPHORUS - Abnormal; Notable for the following:    Phosphorus 5.2 (*)    All other components within normal limits  LACTIC ACID, PLASMA - Abnormal; Notable for the following:    Lactic Acid, Venous 2.6 (*)    All other components within normal limits  CULTURE, BLOOD (ROUTINE X 2)   CULTURE, BLOOD (ROUTINE X 2)  LACTIC ACID, PLASMA  PROCALCITONIN  PROTIME-INR  APTT  COMPREHENSIVE METABOLIC PANEL  CBC  LACTIC ACID, PLASMA    EKG  EKG Interpretation None       Radiology No results found.  Procedures Procedures (including critical care time)  Medications Ordered in ED Medications - No data to display   Initial Impression / Assessment and Plan / ED Course  I have reviewed the triage vital signs and the nursing notes.  Pertinent labs & imaging results that were available during my care of the patient were reviewed by me and considered in my medical decision making (see chart for details).     Failed outpt therapy wet gangrene RLE. Scheduled for arteriogram of same extremity today. Also missed dialysis. Admit.   Final Clinical Impressions(s) / ED Diagnoses   Final diagnoses:  Sepsis (HCC)    New Prescriptions New Prescriptions   No medications on file     I personally preformed the services scribed in my presence. The recorded information has been reviewed is accurate. Raeford RazorStephen Kylo Gavin, MD.      Raeford RazorKohut, Wilhelmena Zea, MD 01/16/17 [phone]

## 2017-01-01 NOTE — H&P (Signed)
History and Physical    [name]:[id number] DOB: [birth year] DOA: 01/01/2017   PCP: Crist Fat, MD   Attending physician: Konrad Dolores  Patient coming from/Resides with: Private residence/wife  Chief Complaint: Generalized weakness, altered mental status, worsening pain right lower extremity  HPI: [name] is a 81 y.o. male with medical history significant for chronic kidney disease on hemodialysis, history of diabetes mellitus currently not on medications, arthritis, hypertension and CAD. Patient was recently discharged on 8/11 afternoon mission for volume overload secondary to reported missed hemodialysis. During the hospitalization he was found to have swelling in wounds to the toes of the right foot. Arterial duplex revealed severe PVD with ABI on the right of 0.75. He was evaluated by vascular surgery and deemed stable to discharge home from a vascular standpoint. He was discharged on oral Augmentin as well as IV vancomycin to be given during dialysis treatments.  He presents to the ER today reporting continued weakness and increased pain and right lower extremity since discharge. His been unable to mobilize since Sunday. Family reports typically he is able to mobilize independently with the assistance of a walker. Family also reports that patient is more sleepy than usual. He has been taking his antibiotics as prescribed with no improvement in the foot. Family has noticed increased purulent drainage from fourth and fifth toes of right foot.  ED Course:  Vital Signs: BP (!) 125/59   Pulse 78   Temp 97.9 F (36.6 C) (Oral)   Resp 14   Ht 5\' 10"  (1.778 m)   Wt 109.8 kg (242 lb)   SpO2 91%   BMI 34.72 kg/m  PCXR: Pulmonary vascular congestion without frank edema or consolidation, bibasilar atelectasis Lab data: Sodium 134, potassium 5.4, chloride 93, CO2 27, glucose 107, BUN 44, creatinine 3.98, anion gap 14, magnesium 2.9, white count 8300 with neutrophils 76% and  absolute neutrophils 6.3%, hemoglobin 11, platelets 208,000 Medications and treatments: None  Review of Systems:  In addition to the HPI above,  No Fever-chills, myalgias or other constitutional symptoms No Headache, changes with Vision or hearing, new weakness, tingling, numbness in any extremity, dizziness, dysarthria or word finding difficulty, tremors or seizure activity No problems swallowing food or Liquids, indigestion/reflux, choking or coughing while eating, abdominal pain with or after eating; family does report patient with poor oral intake especially of fluids No Chest pain, Cough or Shortness of Breath, palpitations, orthopnea or DOE No Abdominal pain, N/V, melena,hematochezia, dark tarry stools No dysuria, malodorous urine, hematuria or flank pain No new skin rashes, lesions, masses or bruises No recent unintentional weight gain or loss No polyuria, polydypsia or polyphagia   Past Medical History:  Diagnosis Date  . Anginal pain (HCC)    before CABG  . Arthritis   . CHF (congestive heart failure) (HCC)   . Constipation   . Coronary artery disease   . Diabetes mellitus   . ESRD (end stage renal disease) (HCC)    TuesThurs Sat [place]  . Hypertension   . Myocardial infarction (HCC)   . Pneumonia    2 years ago    Past Surgical History:  Procedure Laterality Date  . A/V FISTULAGRAM Left 10/31/2016   Procedure: A/V Fistulagram;  Surgeon: Maeola Harman, MD;  Location: [facility] INVASIVE CV LAB;  Service: Cardiovascular;  Laterality: Left;  . ARTERIOVENOUS GRAFT PLACEMENT Left   . BACK SURGERY    . CORONARY ARTERY BYPASS GRAFT    . LAPAROSCOPIC COLON RESECTION  growth  . REVISION OF ARTERIOVENOUS GORETEX GRAFT Left 11/16/2016   Procedure: REVISION OF ARTERIOVENOUS GORETEX GRAFT;  Surgeon: Chuck Hint, MD;  Location: [facility] OR;  Service: Vascular;  Laterality: Left;  . REVISON OF ARTERIOVENOUS FISTULA Left 09/11/2013   Procedure: REVISON OF  ARTERIOVENOUS FISTULA-LEFT ARM; POSSIBLE VENOPLASTY WITH INTEROPERATIVE FISTULOGRAM;  Surgeon: Chuck Hint, MD;  Location: [facility] OR;  Service: Vascular;  Laterality: Left;    Social History   Social History  . Marital status: Married    Spouse name: N/A  . Number of children: N/A  . Years of education: N/A   Occupational History  . Not on file.   Social History Main Topics  . Smoking status: Never Smoker  . Smokeless tobacco: Current User    Types: Snuff  . Alcohol use No  . Drug use: No  . Sexual activity: Not on file   Other Topics Concern  . Not on file   Social History Narrative  . No narrative on file    Mobility: Cane Work history: Not obtained   Allergies  Allergen Reactions  . Iohexol Swelling and Rash  . Zanaflex [Tizanidine] Other (See Comments)    Shut kidneys down Unable to walk or void  . Ezetimibe-Simvastatin Other (See Comments)    Elevated liver enzymes  . Fluvastatin Other (See Comments)  . Simvastatin Other (See Comments)    Elevated liver enzymes    Family History  Problem Relation Age of Onset  . Cancer Father   . Diabetes Sister     Prior to Admission medications   Medication Sig Start Date End Date Taking? Authorizing Provider  allopurinol (ZYLOPRIM) 100 MG tablet Take 100 mg by mouth every evening.    Yes [provider]  amoxicillin-clavulanate (AUGMENTIN) 500-125 MG tablet Take 1 tablet (500 mg total) by mouth daily. 12/29/16 01/08/17 Yes Alwyn Ren, MD  aspirin EC 81 MG tablet Take 81 mg by mouth every evening.    Yes [provider]  diphenhydrAMINE (BENADRYL) 25 mg capsule Take 25 mg by mouth every 6 (six) hours as needed for allergies.   Yes [provider]  sucroferric oxyhydroxide (VELPHORO) 500 MG chewable tablet Chew 500 mg by mouth 2 (two) times daily.   Yes [provider]  tiZANidine (ZANAFLEX) 2 MG tablet Take 2 mg by mouth every 6 (six) hours as needed for muscle spasms.    Yes [provider]  vitamin B-12 (CYANOCOBALAMIN) 250 MCG tablet Take 250 mcg by mouth every evening.    Yes [provider]    Physical Exam: Vitals:   01/01/17 1230 01/01/17 1245 01/01/17 1300 01/01/17 1315  BP: 97/65 (!) 123/57 (!) 127/55 (!) 125/59  Pulse: 75 74 74 78  Resp: 13 16 14 14   Temp:      TempSrc:      SpO2: 96% 98% 96% 91%  Weight:      Height:          Constitutional: NAD, calm, unomfortable 2/2 ongoing pain and right lower extremity Eyes: PERRL, lids and conjunctivae normal ENMT: Mucous membranes are dry. Posterior pharynx clear of any exudate or lesions. Age appropriate dentition.  Neck: normal, supple, no masses, no thyromegaly Respiratory: clear to auscultation bilaterally, no wheezing, no crackles. Slightly diminished in bases Normal respiratory effort. No accessory muscle use.  Cardiovascular: Regular rate and rhythm, no murmurs / rubs / gallops. No extremity edema. Diminished peripheral pulses-unable to palpate pedal or posterior tibialis pulses bilaterally. No  carotid bruits. Bilateral lower extremity edema 2+ slightly worsened on right leg. Hands and feet cool to touch with Abdomen: no tenderness, no masses palpated. No hepatosplenomegaly. Bowel sounds positive.  Musculoskeletal: no clubbing / cyanosis. No joint deformity upper and lower extremities. Good ROM, no contractures. Normal muscle tone.  Skin: no rashes, lesions-patient's right lower extremity is erythematous and warm from mid tibialis to the foot with ulcers between the third and fourth and fourth and fifth toes with purulent drainage Neurologic: CN 2-12 grossly intact. Sensation intact although diminished plantar surface right foot, DTR normal. Strength 4/5 x all 4 extremities.  Psychiatric: [place] is lethargic but awakens easily-oriented times name only   Labs on Admission: I have personally reviewed following labs and imaging studies  CBC:  Recent Labs Lab  12/25/16 2325 12/27/16 0329 12/28/16 0135 12/29/16 1400 01/01/17 1241  WBC 5.8 5.3 6.5 6.1 8.3  NEUTROABS  --   --   --   --  6.3  HGB 10.4* 9.4* 9.5* 10.4* 11.0*  HCT 31.6* 28.6* 28.4* 31.5* 33.8*  MCV 100.0 96.3 93.7 95.2 97.4  PLT 150 132* 139* 149* 208   Basic Metabolic Panel:  Recent Labs Lab 12/25/16 2325 12/26/16 0350 12/27/16 0329 12/28/16 0135 12/29/16 1400 01/01/17 1241  NA 139  --  138 134* 135 134*  K 6.9* 5.7* 5.0 5.3* 4.6 5.4*  CL 96*  --  94* 94* 94* 93*  CO2 21*  --  25 23 27 27   GLUCOSE 79  --  64* 80 102* 107*  BUN 63*  --  36* 45* 34* 44*  CREATININE 16.14*  --  12.39* 14.45* 12.18* 13.98*  CALCIUM 9.1  --  8.7* 8.7* 9.3 9.3  MG  --   --   --   --   --  2.9*  PHOS  --   --   --  6.8* 6.2*  --    GFR: Estimated Creatinine Clearance: 5.3 mL/min (A) (by C-G formula based on SCr of 13.98 mg/dL (H)). Liver Function Tests:  Recent Labs Lab 12/28/16 0135 12/29/16 1400  ALBUMIN 3.0* 3.0*   No results for input(s): LIPASE, AMYLASE in the last 168 hours. No results for input(s): AMMONIA in the last 168 hours. Coagulation Profile: No results for input(s): INR, PROTIME in the last 168 hours. Cardiac Enzymes: No results for input(s): CKTOTAL, CKMB, CKMBINDEX, TROPONINI in the last 168 hours. BNP (last 3 results) No results for input(s): PROBNP in the last 8760 hours. HbA1C: No results for input(s): HGBA1C in the last 72 hours. CBG:  Recent Labs Lab 12/26/16 0830 12/26/16 1443 12/26/16 1514 12/26/16 1732 12/26/16 2107  GLUCAP 119* 62* 103* 116* 112*   Lipid Profile: No results for input(s): CHOL, HDL, LDLCALC, TRIG, CHOLHDL, LDLDIRECT in the last 72 hours. Thyroid Function Tests: No results for input(s): TSH, T4TOTAL, FREET4, T3FREE, THYROIDAB in the last 72 hours. Anemia Panel: No results for input(s): VITAMINB12, FOLATE, FERRITIN, TIBC, IRON, RETICCTPCT in the last 72 hours. Urine analysis:    Component Value Date/Time   COLORURINE  YELLOW 12/26/2016 0024   APPEARANCEUR CLOUDY (A) 12/26/2016 0024   LABSPEC 1.016 12/26/2016 0024   PHURINE 8.0 12/26/2016 0024   GLUCOSEU NEGATIVE 12/26/2016 0024   HGBUR SMALL (A) 12/26/2016 0024   BILIRUBINUR NEGATIVE 12/26/2016 0024   KETONESUR NEGATIVE 12/26/2016 0024   PROTEINUR 100 (A) 12/26/2016 0024   UROBILINOGEN 0.2 03/08/2011 0253   NITRITE NEGATIVE 12/26/2016 0024   LEUKOCYTESUR LARGE (A) 12/26/2016 0024  Sepsis Labs: @LABRCNTIP (procalcitonin:4,lacticidven:4) ) Recent Results (from the past 240 hour(s))  Blood Cultures x 2 sites     Status: None   Collection Time: 12/26/16  4:01 AM  Result Value Ref Range Status   Specimen Description BLOOD RIGHT HAND  Final   Special Requests   Final    BOTTLES DRAWN AEROBIC AND ANAEROBIC Blood Culture adequate volume   Culture NO GROWTH 5 DAYS  Final   Report Status 12/31/2016 FINAL  Final  Blood Cultures x 2 sites     Status: None   Collection Time: 12/26/16  2:14 PM  Result Value Ref Range Status   Specimen Description BLOOD RIGHT ANTECUBITAL  Final   Special Requests   Final    BOTTLES DRAWN AEROBIC AND ANAEROBIC Blood Culture adequate volume   Culture NO GROWTH 5 DAYS  Final   Report Status 12/31/2016 FINAL  Final  MRSA PCR Screening     Status: Abnormal   Collection Time: 12/26/16  5:14 PM  Result Value Ref Range Status   MRSA by PCR INVALID RESULTS, SPECIMEN SENT FOR CULTURE (A) NEGATIVE Final    Comment: RESULT CALLED TO, READ BACK BY AND VERIFIED WITH: E.CASTRO, RN 12/27/16 0057 L.CHAMPION   MRSA culture     Status: None   Collection Time: 12/26/16  5:14 PM  Result Value Ref Range Status   Specimen Description NASAL SWAB  Final   Special Requests NONE  Final   Culture NO MRSA DETECTED  Final   Report Status 12/28/2016 FINAL  Final     Radiological Exams on Admission: Dg Chest Port 1 View  Result Date: 01/01/2017 CLINICAL DATA:  Nausea and weakness.  End-stage renal disease EXAM: PORTABLE CHEST 1 VIEW  COMPARISON:  December 25, 2016 FINDINGS: There is bibasilar atelectatic change. There is no frank edema or consolidation. Heart is mildly enlarged with mild pulmonary venous hypertension. Patient is status post coronary artery bypass grafting. No adenopathy. No appreciable bone lesions. IMPRESSION: Pulmonary vascular congestion without frank edema or consolidation. There is bibasilar atelectasis. Electronically Signed   By: Bretta Bang III M.D.   On: 01/01/2017 14:08    EKG: (Independently reviewed) Sinus rhythm with ventricular rate 75 bpm, QTC prolonged at 588 ms, no appreciable ischemic changes  Assessment/Plan Principal Problem:   Sepsis due to cellulitis  -Patient presents with altered mental status, known source of infection with apparent failed OP antibiotic therapy and worsened pain/increased purulent drainage right lower extremity with known severe PVD-concern that is evolving into ischemic gangrene -No leukocytosis but patient was on antibiotics at home and this may be blunting immune response -Blood cultures -Trend lactic acid, obtain blood cultures -Empiric Zosyn/vancomycin -Admit to stepdown -Systolic blood pressure has remained in the 120s so unless lactic acid greater than 4.0 therefore no indication to pursue fluid volume challenge at this juncture especially in dialysis patient  Active Problems:   Peripheral vascular disease of lower extremity with ulceration/RLE cellulitis -Outpatient arteriogram planned based on significantly abnormal ABIs previous admission -Consult VVS -Patient having significant pain so have ordered a combination of oral and IV narcotics -Pending results of arteriogram patient may require surgical intervention i.e. amputation     Acute metabolic encephalopathy -Secondary to sepsis -Treat underlying causes    Hypertension -Current blood pressure suboptimal in setting of sepsis -Patient was not on antihypertensive agents prior to admission    CKD  (chronic kidney disease) stage V requiring chronic dialysis  -Has been evaluated by dialysis and plans are to  pursue dialysis ASAP -Typical days are TTS    Coronary artery disease -Currently asymptomatic -Continue preadmission aspirin -Not on beta blocker statin    Diabetes mellitus -Diet controlled -HgbA1c was 5.9 and on previous admission     DVT prophylaxis: Subcutaneous heparin Code Status: Full Family Communication: Family at bedside  Disposition Plan: Home versus SNF Consults called: Nephrology/[place]; VVS/Chen    Ahmaad Neidhardt L. ANP-BC [facility] Pager [phone]   If 7PM-7AM, please contact night-coverage [email or web address] Password [email or web address]  01/01/2017, 2:12 PM

## 2017-01-01 NOTE — Progress Notes (Signed)
Pharmacy Antibiotic Note  Ian BlakeWarren J Torpey is a 81 y.o. male admitted on 01/01/2017 with cellulitis.  Pharmacy has been consulted for vancomycin/zosyn dosing. Was recently on vancomycin during last admission. Previously was treated with doxycycline which was changed to Augmentin and vancomycin. Last documented vancomycin dose was on 8/11.   Plan: Will give one time doses of vancomycin and zosyn. No load for vancomycin given recent administration. Vancomycin 1000 mg post-HD if tolerates full dialysis session. Zosyn 3.375 g every 12 hours. Check vancomycin level prior to next dialysis session.  Height: 5\' 10"  (177.8 cm) Weight: 242 lb (109.8 kg) IBW/kg (Calculated) : 73  Temp (24hrs), Avg:97.9 F (36.6 C), Min:97.9 F (36.6 C), Max:97.9 F (36.6 C)   Recent Labs Lab 12/25/16 2325 12/27/16 0329 12/28/16 0135 12/29/16 1400 01/01/17 1241  WBC 5.8 5.3 6.5 6.1 8.3  CREATININE 16.14* 12.39* 14.45* 12.18* 13.98*    Estimated Creatinine Clearance: 5.3 mL/min (A) (by C-G formula based on SCr of 13.98 mg/dL (H)).    Allergies  Allergen Reactions  . Iohexol Swelling and Rash  . Zanaflex [Tizanidine] Other (See Comments)    Shut kidneys down Unable to walk or void  . Ezetimibe-Simvastatin Other (See Comments)    Elevated liver enzymes  . Fluvastatin Other (See Comments)  . Simvastatin Other (See Comments)    Elevated liver enzymes    Antimicrobials this admission: Zosyn 8/14 >>  Vancomycin 8/14 >>   Dose adjustments this admission: N/A  Microbiology results: 8/14 BCx: ordered  Thank you for allowing pharmacy to be a part of this patient's care.  Clarnce FlockKimberly A Perkins 01/01/2017 2:17 PM

## 2017-01-02 DIAGNOSIS — L97909 Non-pressure chronic ulcer of unspecified part of unspecified lower leg with unspecified severity: Secondary | ICD-10-CM

## 2017-01-02 DIAGNOSIS — Z992 Dependence on renal dialysis: Secondary | ICD-10-CM

## 2017-01-02 DIAGNOSIS — I739 Peripheral vascular disease, unspecified: Secondary | ICD-10-CM

## 2017-01-02 DIAGNOSIS — N186 End stage renal disease: Secondary | ICD-10-CM

## 2017-01-02 DIAGNOSIS — G9341 Metabolic encephalopathy: Secondary | ICD-10-CM

## 2017-01-02 DIAGNOSIS — A419 Sepsis, unspecified organism: Principal | ICD-10-CM

## 2017-01-02 DIAGNOSIS — I2510 Atherosclerotic heart disease of native coronary artery without angina pectoris: Secondary | ICD-10-CM

## 2017-01-02 DIAGNOSIS — L0390 Cellulitis, unspecified: Secondary | ICD-10-CM

## 2017-01-02 DIAGNOSIS — E118 Type 2 diabetes mellitus with unspecified complications: Secondary | ICD-10-CM

## 2017-01-02 LAB — COMPREHENSIVE METABOLIC PANEL
ALK PHOS: 130 U/L — AB (ref 38–126)
ALT: 58 U/L (ref 17–63)
ANION GAP: 14 (ref 5–15)
AST: 145 U/L — AB (ref 15–41)
Albumin: 2.9 g/dL — ABNORMAL LOW (ref 3.5–5.0)
BUN: 28 mg/dL — ABNORMAL HIGH (ref 6–20)
CALCIUM: 9.7 mg/dL (ref 8.9–10.3)
CO2: 26 mmol/L (ref 22–32)
CREATININE: 10.46 mg/dL — AB (ref 0.61–1.24)
Chloride: 96 mmol/L — ABNORMAL LOW (ref 101–111)
GFR calc non Af Amer: 4 mL/min — ABNORMAL LOW (ref >60)
GFR, EST AFRICAN AMERICAN: 5 mL/min — AB (ref >60)
GLUCOSE: 91 mg/dL (ref 65–99)
Potassium: 5.1 mmol/L (ref 3.5–5.1)
SODIUM: 136 mmol/L (ref 135–145)
Total Bilirubin: 0.6 mg/dL (ref 0.3–1.2)
Total Protein: 6.4 g/dL — ABNORMAL LOW (ref 6.5–8.1)

## 2017-01-02 LAB — CBC
HCT: 34.1 % — ABNORMAL LOW (ref 39.0–52.0)
Hemoglobin: 11.1 g/dL — ABNORMAL LOW (ref 13.0–17.0)
MCH: 31.7 pg (ref 26.0–34.0)
MCHC: 32.6 g/dL (ref 30.0–36.0)
MCV: 97.4 fL (ref 78.0–100.0)
PLATELETS: 215 10*3/uL (ref 150–400)
RBC: 3.5 MIL/uL — ABNORMAL LOW (ref 4.22–5.81)
RDW: 14.9 % (ref 11.5–15.5)
WBC: 7.3 10*3/uL (ref 4.0–10.5)

## 2017-01-02 LAB — LACTIC ACID, PLASMA
Lactic Acid, Venous: 2.6 mmol/L (ref 0.5–1.9)
Lactic Acid, Venous: 1.2 mmol/L (ref 0.5–1.9)

## 2017-01-02 MED ORDER — RENA-VITE PO TABS
1.0000 | ORAL_TABLET | Freq: Every day | ORAL | Status: DC
  Administered 2017-01-02 – 2017-01-06 (×5): 1 via ORAL
  Filled 2017-01-02 (×5): qty 1

## 2017-01-02 MED ORDER — DARBEPOETIN ALFA 100 MCG/0.5ML IJ SOSY
100.0000 ug | PREFILLED_SYRINGE | INTRAMUSCULAR | Status: DC
  Administered 2017-01-03: 100 ug via INTRAVENOUS
  Filled 2017-01-02: qty 0.5

## 2017-01-02 MED ORDER — SODIUM CHLORIDE 0.9 % IV BOLUS (SEPSIS)
500.0000 mL | Freq: Once | INTRAVENOUS | Status: AC
  Administered 2017-01-02: 500 mL via INTRAVENOUS

## 2017-01-02 NOTE — Progress Notes (Signed)
Patient drowsy all night. Fell asleep at times when I tried to talk with him. Slow reactions to commands particularly during neuro checks. Refused night meds at first. Was able to get him to take the pills, refused to drink water with the pills and also refused the night and morning heparin. Bed alarm on,

## 2017-01-02 NOTE — Progress Notes (Signed)
PROGRESS NOTE    [name]  DGU:[id number] DOB: Feb 23, 1936 DOA: 01/01/2017 PCP: Crist Fat, MD    Brief Narrative:  81 year old male who presents with generalized weakness, altered mental status, worsening pain on the right lower extremity. Patient is known to have end-stage renal disease on hemodialysis, diabetes mellitus type 2, arthritis, hypertension, coronary disease and peripheral vascular disease. He was recently diagnosed with right lower extremity wound infection, he was placed on IV vancomycin during dialysis. He developed worsening symptoms despite IV antibiotics, increased pain and purulent discharge, associated with ambulatory dysfunction. On his initial physical examination blood pressure 125/59, heart rate 78, temperature 97.9, respiratory 14, oxygen saturation 91%. Dry mucous membranes, his lungs were clear to auscultation bilaterally, heart S1-S2 present and rhythmic, no gallops or murmurs, the abdomen was soft nontender, lower extremities with 2+ edema more right than left. Sodium 134, potassium 5.4, chloride 93, bicarbonate 27, glucose 107, BUN 44, creatinine 13.9, calcium 9.3, white count 8.3, hemoglobin 11.0, hematocrit 33.8, platelets 208. Chest x-ray with low lung volumes, right lower lobe atelectasis. EKG sinus rhythm, normal intervals.  Patient was admitted with the working diagnosis of sepsis due to right lower extremity cellulitis.  Assessment & Plan:   Principal Problem:   Sepsis due to cellulitis [facility]) Active Problems:   Hypertension   Coronary artery disease   CKD (chronic kidney disease) stage V requiring chronic dialysis (HCC)   Peripheral vascular disease of lower extremity with ulceration/RLE cellulitis (HCC)   Acute metabolic encephalopathy   Diabetes mellitus   Diabetes mellitus with complication (HCC)   1. Right lower extremity cellulitis, complicated by sepsis, present on admission. Will continue antibiotic therapy with IV vancomycin and Zosyn.  Follow vanc levels per pharmacy protocol. Will follow up with wound care recommendations, if persistent signs of infection, may need further imaging with MRI. Follow with vascular surgery recommendations.   2. Metabolic encephalopathy. Patient more awake and alert, will continue supportive medical therapy and neuro checks per unit protocol.   3. End-stage renal disease on hemodialysis. No signs of volume overload, serum K at 5.1 with serum bicarbonate at 26. Nephrology has been consulted for maintenance renal replacement therapy. TTS  4. Hypertension. Patient off antihypertensive medications, will target MAP more than 65.   5. Type 2 diabetes mellitus. Will continue glucose cover and monitoring with iss, patient tolerating po well.   6. Peripheral vascular disease, coronary artery disease. Antiplatelet therapy with asa. Continue pain control and follow recommendations from vascular surgery.      DVT prophylaxis: heparin  Code Status: Full  Family Communication:  Disposition Plan:    Consultants:     Procedures:    Antimicrobials:  Vancomycin  Zosyn    Subjective: Patient feeling better, pain has improved, still persistent edema, no nausea or vomiting, no dyspnea or chest pain, had HD yesterday.   Objective: Vitals:   01/02/17 0004 01/02/17 0410 01/02/17 0756 01/02/17 1147  BP: (!) 157/67 127/67 (!) 121/31 (!) 98/45  Pulse: (!) 34  74 78  Resp: 15 16  19   Temp:   97.8 F (36.6 C) 98 F (36.7 C)  TempSrc:   Oral   SpO2:   92% 99%  Weight:  109.9 kg (242 lb 3.2 oz)    Height:        Intake/Output Summary (Last 24 hours) at 01/02/17 1447 Last data filed at 01/02/17 0900  Gross per 24 hour  Intake  120 ml  Output             2900 ml  Net            -2780 ml   Filed Weights   01/01/17 1150 01/02/17 0410  Weight: 109.8 kg (242 lb) 109.9 kg (242 lb 3.2 oz)    Examination:  General exam: deconditioned E ENT. No pallor or icterus Respiratory  system: Clear to auscultation. Respiratory effort normal. No wheezing, rales or rhonchi.  Cardiovascular system: S1 & S2 heard, RRR. No JVD, murmurs, rubs, gallops or clicks. Right foot edema. Gastrointestinal system: Abdomen is nondistended, soft and nontender. No organomegaly or masses felt. Normal bowel sounds heard. Central nervous system: Alert and oriented. No focal neurological deficits. Extremities: Symmetric 5 x 5 power. Skin: second and third toe dark discoloration, purulent drainage, tender to palpation.     Data Reviewed: I have personally reviewed following labs and imaging studies  CBC:  Recent Labs Lab 12/28/16 0135 12/29/16 1400 01/01/17 1241 01/01/17 2050 01/02/17 0847  WBC 6.5 6.1 8.3 7.6 7.3  NEUTROABS  --   --  6.3  --   --   HGB 9.5* 10.4* 11.0* 13.3 11.1*  HCT 28.4* 31.5* 33.8* 40.7 34.1*  MCV 93.7 95.2 97.4 97.6 97.4  PLT 139* 149* 208 207 215   Basic Metabolic Panel:  Recent Labs Lab 12/28/16 0135 12/29/16 1400 01/01/17 1241 01/01/17 2050 01/02/17 0847  NA 134* 135 134* 133* 136  K 5.3* 4.6 5.4* 5.2* 5.1  CL 94* 94* 93* 92* 96*  CO2 23 27 27 22 26   GLUCOSE 80 102* 107* 97 91  BUN 45* 34* 44* 21* 28*  CREATININE 14.45* 12.18* 13.98* 8.80* 10.46*  CALCIUM 8.7* 9.3 9.3 9.7 9.7  MG  --   --  2.9* 2.5*  --   PHOS 6.8* 6.2*  --  5.2*  5.2*  --    GFR: Estimated Creatinine Clearance: 7.1 mL/min (A) (by C-G formula based on SCr of 10.46 mg/dL (H)). Liver Function Tests:  Recent Labs Lab 12/28/16 0135 12/29/16 1400 01/01/17 2050 01/02/17 0847  AST  --   --   --  145*  ALT  --   --   --  58  ALKPHOS  --   --   --  130*  BILITOT  --   --   --  0.6  PROT  --   --   --  6.4*  ALBUMIN 3.0* 3.0* 3.6 2.9*   No results for input(s): LIPASE, AMYLASE in the last 168 hours. No results for input(s): AMMONIA in the last 168 hours. Coagulation Profile:  Recent Labs Lab 01/01/17 1429  INR 1.10   Cardiac Enzymes: No results for input(s):  CKTOTAL, CKMB, CKMBINDEX, TROPONINI in the last 168 hours. BNP (last 3 results) No results for input(s): PROBNP in the last 8760 hours. HbA1C: No results for input(s): HGBA1C in the last 72 hours. CBG:  Recent Labs Lab 12/26/16 1514 12/26/16 1732 12/26/16 2107  GLUCAP 103* 116* 112*   Lipid Profile: No results for input(s): CHOL, HDL, LDLCALC, TRIG, CHOLHDL, LDLDIRECT in the last 72 hours. Thyroid Function Tests: No results for input(s): TSH, T4TOTAL, FREET4, T3FREE, THYROIDAB in the last 72 hours. Anemia Panel: No results for input(s): VITAMINB12, FOLATE, FERRITIN, TIBC, IRON, RETICCTPCT in the last 72 hours. Sepsis Labs:  Recent Labs Lab 01/01/17 1429 01/01/17 1433 01/01/17 1910 01/01/17 2301 01/02/17 0847  PROCALCITON 1.12  --   --   --   --  LATICACIDVEN  --  0.9 2.3* 2.6* 1.2    Recent Results (from the past 240 hour(s))  Blood Cultures x 2 sites     Status: None   Collection Time: 12/26/16  4:01 AM  Result Value Ref Range Status   Specimen Description BLOOD RIGHT HAND  Final   Special Requests   Final    BOTTLES DRAWN AEROBIC AND ANAEROBIC Blood Culture adequate volume   Culture NO GROWTH 5 DAYS  Final   Report Status 12/31/2016 FINAL  Final  Blood Cultures x 2 sites     Status: None   Collection Time: 12/26/16  2:14 PM  Result Value Ref Range Status   Specimen Description BLOOD RIGHT ANTECUBITAL  Final   Special Requests   Final    BOTTLES DRAWN AEROBIC AND ANAEROBIC Blood Culture adequate volume   Culture NO GROWTH 5 DAYS  Final   Report Status 12/31/2016 FINAL  Final  MRSA PCR Screening     Status: Abnormal   Collection Time: 12/26/16  5:14 PM  Result Value Ref Range Status   MRSA by PCR INVALID RESULTS, SPECIMEN SENT FOR CULTURE (A) NEGATIVE Final    Comment: RESULT CALLED TO, READ BACK BY AND VERIFIED WITH: E.CASTRO, RN 12/27/16 0057 L.CHAMPION   MRSA culture     Status: None   Collection Time: 12/26/16  5:14 PM  Result Value Ref Range Status    Specimen Description NASAL SWAB  Final   Special Requests NONE  Final   Culture NO MRSA DETECTED  Final   Report Status 12/28/2016 FINAL  Final  Culture, blood (x 2)     Status: None (Preliminary result)   Collection Time: 01/01/17  2:10 PM  Result Value Ref Range Status   Specimen Description BLOOD AVG  Final   Special Requests   Final    BOTTLES DRAWN AEROBIC AND ANAEROBIC Blood Culture results may not be optimal due to an excessive volume of blood received in culture bottles   Culture PENDING  Incomplete   Report Status PENDING  Incomplete  Culture, blood (x 2)     Status: None (Preliminary result)   Collection Time: 01/01/17  2:25 PM  Result Value Ref Range Status   Specimen Description BLOOD AVG  Final   Special Requests   Final    BOTTLES DRAWN AEROBIC AND ANAEROBIC Blood Culture results may not be optimal due to an excessive volume of blood received in culture bottles   Culture NO GROWTH < 24 HOURS  Final   Report Status PENDING  Incomplete         Radiology Studies: Dg Chest Port 1 View  Result Date: 01/01/2017 CLINICAL DATA:  Nausea and weakness.  End-stage renal disease EXAM: PORTABLE CHEST 1 VIEW COMPARISON:  December 25, 2016 FINDINGS: There is bibasilar atelectatic change. There is no frank edema or consolidation. Heart is mildly enlarged with mild pulmonary venous hypertension. Patient is status post coronary artery bypass grafting. No adenopathy. No appreciable bone lesions. IMPRESSION: Pulmonary vascular congestion without frank edema or consolidation. There is bibasilar atelectasis. Electronically Signed   By: Bretta Bang III M.D.   On: 01/01/2017 14:08        Scheduled Meds: . allopurinol  100 mg Oral QPM  . aspirin EC  81 mg Oral QPM  . [START ON 01/03/2017] darbepoetin (ARANESP) injection - DIALYSIS  100 mcg Intravenous Q Thu-HD  . heparin  5,000 Units Subcutaneous Q8H  . multivitamin  1 tablet Oral QHS  .  sodium chloride flush  3 mL Intravenous Q12H    . sucroferric oxyhydroxide  1,000 mg Oral TID WC  . vitamin B-12  250 mcg Oral QPM   Continuous Infusions: . piperacillin-tazobactam    . piperacillin-tazobactam (ZOSYN)  IV Stopped (01/02/17 1304)  . [START ON 01/03/2017] vancomycin       LOS: 1 day        Coralie KeensMauricio Daniel Arrien, MD [facility] Pager [phone]  If 7PM-7AM, please contact night-coverage [email or web address] Password [email or web address] 01/02/2017, 2:47 PM

## 2017-01-02 NOTE — Progress Notes (Signed)
Received call from lab, Lactic acid up from 2.3 to 2.6 at 2301. Paged physician to advise. Patient was receiving the bolus during the time of the second lab draw. Will wait on orders if any

## 2017-01-02 NOTE — Progress Notes (Signed)
S: Denies any new CO though slightly confused O:BP 127/67 (BP Location: Right Arm)   Pulse (!) 34   Temp 98 F (36.7 C) (Oral)   Resp 16   Ht 5\' 10"  (1.778 m)   Wt 109.9 kg (242 lb 3.2 oz)   SpO2 93%   BMI 34.75 kg/m   Intake/Output Summary (Last 24 hours) at 01/02/17 0726 Last data filed at 01/01/17 1843  Gross per 24 hour  Intake                0 ml  Output             2900 ml  Net            -2900 ml   Weight change:  Gen: Awake and alert CVS: RRR Resp:Clear Abd:+ BS NTND Ext: Rt foot with ischemic changes, erythema seems a bit less.  Purulence bw digits 3/4 and 4/5.  Lt AVG + bruit NEURO: CNI, Ox2   . allopurinol  100 mg Oral QPM  . aspirin EC  81 mg Oral QPM  . heparin  5,000 Units Subcutaneous Q8H  . sodium chloride flush  3 mL Intravenous Q12H  . sucroferric oxyhydroxide  1,000 mg Oral TID WC  . vitamin B-12  250 mcg Oral QPM   Dg Chest Port 1 View  Result Date: 01/01/2017 CLINICAL DATA:  Nausea and weakness.  End-stage renal disease EXAM: PORTABLE CHEST 1 VIEW COMPARISON:  December 25, 2016 FINDINGS: There is bibasilar atelectatic change. There is no frank edema or consolidation. Heart is mildly enlarged with mild pulmonary venous hypertension. Patient is status post coronary artery bypass grafting. No adenopathy. No appreciable bone lesions. IMPRESSION: Pulmonary vascular congestion without frank edema or consolidation. There is bibasilar atelectasis. Electronically Signed   By: Bretta BangWilliam  Woodruff III M.D.   On: 01/01/2017 14:08   BMET    Component Value Date/Time   NA 133 (L) 01/01/2017 2050   K 5.2 (H) 01/01/2017 2050   CL 92 (L) 01/01/2017 2050   CO2 22 01/01/2017 2050   GLUCOSE 97 01/01/2017 2050   BUN 21 (H) 01/01/2017 2050   CREATININE 8.80 (H) 01/01/2017 2050   CALCIUM 9.7 01/01/2017 2050   CALCIUM 8.4 11/29/2007 1515   GFRNONAA 5 (L) 01/01/2017 2050   GFRAA 6 (L) 01/01/2017 2050   CBC    Component Value Date/Time   WBC 7.6 01/01/2017 2050   RBC  4.17 (L) 01/01/2017 2050   HGB 13.3 01/01/2017 2050   HCT 40.7 01/01/2017 2050   PLT 207 01/01/2017 2050   MCV 97.6 01/01/2017 2050   MCH 31.9 01/01/2017 2050   MCHC 32.7 01/01/2017 2050   RDW 14.9 01/01/2017 2050   LYMPHSABS 0.9 01/01/2017 1241   MONOABS 0.8 01/01/2017 1241   EOSABS 0.3 01/01/2017 1241   BASOSABS 0.0 01/01/2017 1241     Assessment:  1. Ischemic Rt foot with cellulitis 2. Anemia 3. Sec HPTH on parsabiv at outpt but not available inpt 4. DM  Not on insulin 5. HTN 6. ESRD  TTS    Plan: 1. HD tomorrow 2. Awaiting LE angiogram 3. Start renavite 4. Start aranesp   [name] T

## 2017-01-02 NOTE — Progress Notes (Signed)
  Progress Note    01/02/2017 2:44 PM * No surgery date entered *  Subjective:  No acute issues  Vitals:   01/02/17 0756 01/02/17 1147  BP: (!) 121/31 (!) 98/45  Pulse: 74 78  Resp:  19  Temp: 97.8 F (36.6 C) 98 F (36.7 C)  SpO2: 92% 99%    Physical Exam: [email or web address] Palpable right popliteal pulse Stable ulceration of right toes 2-4  CBC    Component Value Date/Time   WBC 7.3 01/02/2017 0847   RBC 3.50 (L) 01/02/2017 0847   HGB 11.1 (L) 01/02/2017 0847   HCT 34.1 (L) 01/02/2017 0847   PLT 215 01/02/2017 0847   MCV 97.4 01/02/2017 0847   MCH 31.7 01/02/2017 0847   MCHC 32.6 01/02/2017 0847   RDW 14.9 01/02/2017 0847   LYMPHSABS 0.9 01/01/2017 1241   MONOABS 0.8 01/01/2017 1241   EOSABS 0.3 01/01/2017 1241   BASOSABS 0.0 01/01/2017 1241    BMET    Component Value Date/Time   NA 136 01/02/2017 0847   K 5.1 01/02/2017 0847   CL 96 (L) 01/02/2017 0847   CO2 26 01/02/2017 0847   GLUCOSE 91 01/02/2017 0847   BUN 28 (H) 01/02/2017 0847   CREATININE 10.46 (H) 01/02/2017 0847   CALCIUM 9.7 01/02/2017 0847   CALCIUM 8.4 11/29/2007 1515   GFRNONAA 4 (L) 01/02/2017 0847   GFRAA 5 (L) 01/02/2017 0847    INR    Component Value Date/Time   INR 1.10 01/01/2017 1429     Intake/Output Summary (Last 24 hours) at 01/02/17 1444 Last data filed at 01/02/17 0900  Gross per 24 hour  Intake              120 ml  Output             2900 ml  Net            -2780 ml     Assessment:  81 y.o. male is here with ulceration of right toes.  Plan: Npo at midnight for angiogram tomorrow with Dr. Starr Sinclairhen  Stephen Turnbaugh C. Randie Heinzain, MD [facility] Office: [phone] Pager: [phone]  01/02/2017 2:44 PM

## 2017-01-03 ENCOUNTER — Encounter (HOSPITAL_COMMUNITY): Payer: Self-pay | Admitting: Vascular Surgery

## 2017-01-03 ENCOUNTER — Encounter (HOSPITAL_COMMUNITY)
Admission: EM | Disposition: A | Discharge status: Skilled Nursing Facility | Payer: Self-pay | Source: Home / Self Care | Attending: Internal Medicine

## 2017-01-03 DIAGNOSIS — Z794 Long term (current) use of insulin: Secondary | ICD-10-CM

## 2017-01-03 DIAGNOSIS — E11621 Type 2 diabetes mellitus with foot ulcer: Secondary | ICD-10-CM

## 2017-01-03 DIAGNOSIS — E1152 Type 2 diabetes mellitus with diabetic peripheral angiopathy with gangrene: Secondary | ICD-10-CM

## 2017-01-03 DIAGNOSIS — L97519 Non-pressure chronic ulcer of other part of right foot with unspecified severity: Secondary | ICD-10-CM

## 2017-01-03 DIAGNOSIS — I10 Essential (primary) hypertension: Secondary | ICD-10-CM

## 2017-01-03 HISTORY — PX: ABDOMINAL AORTOGRAM W/LOWER EXTREMITY: CATH118223

## 2017-01-03 LAB — GLUCOSE, CAPILLARY
GLUCOSE-CAPILLARY: 128 mg/dL — AB (ref 65–99)
Glucose-Capillary: 105 mg/dL — ABNORMAL HIGH (ref 65–99)

## 2017-01-03 LAB — BASIC METABOLIC PANEL
ANION GAP: 14 (ref 5–15)
BUN: 38 mg/dL — ABNORMAL HIGH (ref 6–20)
CALCIUM: 9.5 mg/dL (ref 8.9–10.3)
CO2: 25 mmol/L (ref 22–32)
Chloride: 97 mmol/L — ABNORMAL LOW (ref 101–111)
Creatinine, Ser: 12.08 mg/dL — ABNORMAL HIGH (ref 0.61–1.24)
GFR calc non Af Amer: 3 mL/min — ABNORMAL LOW (ref >60)
GFR, EST AFRICAN AMERICAN: 4 mL/min — AB (ref >60)
Glucose, Bld: 83 mg/dL (ref 65–99)
POTASSIUM: 5.1 mmol/L (ref 3.5–5.1)
SODIUM: 136 mmol/L (ref 135–145)

## 2017-01-03 LAB — CBC
HCT: 31.4 % — ABNORMAL LOW (ref 39.0–52.0)
HEMOGLOBIN: 10.3 g/dL — AB (ref 13.0–17.0)
MCH: 32.1 pg (ref 26.0–34.0)
MCHC: 32.8 g/dL (ref 30.0–36.0)
MCV: 97.8 fL (ref 78.0–100.0)
Platelets: 203 10*3/uL (ref 150–400)
RBC: 3.21 MIL/uL — ABNORMAL LOW (ref 4.22–5.81)
RDW: 14.8 % (ref 11.5–15.5)
WBC: 6.7 10*3/uL (ref 4.0–10.5)

## 2017-01-03 LAB — POCT ACTIVATED CLOTTING TIME: Activated Clotting Time: 147 seconds

## 2017-01-03 LAB — VANCOMYCIN, RANDOM: VANCOMYCIN RM: 16

## 2017-01-03 SURGERY — ABDOMINAL AORTOGRAM W/LOWER EXTREMITY
Anesthesia: LOCAL

## 2017-01-03 MED ORDER — HEPARIN (PORCINE) IN NACL 2-0.9 UNIT/ML-% IJ SOLN
INTRAMUSCULAR | Status: AC | PRN
  Administered 2017-01-03: 1000 mL via INTRA_ARTERIAL

## 2017-01-03 MED ORDER — LORAZEPAM 2 MG/ML IJ SOLN
0.2500 mg | Freq: Once | INTRAMUSCULAR | Status: AC
  Administered 2017-01-03: 0.25 mg via INTRAVENOUS
  Filled 2017-01-03: qty 1

## 2017-01-03 MED ORDER — FAMOTIDINE IN NACL 20-0.9 MG/50ML-% IV SOLN
INTRAVENOUS | Status: AC
  Filled 2017-01-03: qty 50

## 2017-01-03 MED ORDER — LIDOCAINE HCL (PF) 1 % IJ SOLN
INTRAMUSCULAR | Status: DC | PRN
  Administered 2017-01-03: 17 mL via SUBCUTANEOUS

## 2017-01-03 MED ORDER — ONDANSETRON HCL 4 MG/2ML IJ SOLN: 4.0000 mg | Freq: Four times a day (QID) | INTRAMUSCULAR | Status: DC | PRN

## 2017-01-03 MED ORDER — SODIUM CHLORIDE 0.9% FLUSH
3.0000 mL | Freq: Two times a day (BID) | INTRAVENOUS | Status: DC
  Administered 2017-01-04: 3 mL via INTRAVENOUS
  Administered 2017-01-04: 10 mL via INTRAVENOUS
  Administered 2017-01-06: 3 mL via INTRAVENOUS

## 2017-01-03 MED ORDER — SODIUM CHLORIDE 0.9% FLUSH: 3.0000 mL | INTRAVENOUS | Status: DC | PRN

## 2017-01-03 MED ORDER — DIPHENHYDRAMINE HCL 50 MG/ML IJ SOLN
INTRAMUSCULAR | Status: AC
  Filled 2017-01-03: qty 1

## 2017-01-03 MED ORDER — METHYLPREDNISOLONE SODIUM SUCC 125 MG IJ SOLR
INTRAMUSCULAR | Status: AC
  Filled 2017-01-03: qty 2

## 2017-01-03 MED ORDER — LABETALOL HCL 5 MG/ML IV SOLN: 10.0000 mg | INTRAVENOUS | Status: DC | PRN

## 2017-01-03 MED ORDER — FAMOTIDINE IN NACL 20-0.9 MG/50ML-% IV SOLN
20.0000 mg | INTRAVENOUS | Status: AC
  Administered 2017-01-03: 20 mg via INTRAVENOUS
  Filled 2017-01-03: qty 50

## 2017-01-03 MED ORDER — SODIUM CHLORIDE 0.9 % IV SOLN
250.0000 mL | INTRAVENOUS | Status: DC | PRN
Start: 2017-01-03 — End: 2017-01-03

## 2017-01-03 MED ORDER — VANCOMYCIN HCL IN DEXTROSE 1-5 GM/200ML-% IV SOLN
INTRAVENOUS | Status: AC
  Filled 2017-01-03: qty 200

## 2017-01-03 MED ORDER — DARBEPOETIN ALFA 100 MCG/0.5ML IJ SOSY
PREFILLED_SYRINGE | INTRAMUSCULAR | Status: AC
  Filled 2017-01-03: qty 0.5

## 2017-01-03 MED ORDER — IODIXANOL 320 MG/ML IV SOLN
INTRAVENOUS | Status: DC | PRN
  Administered 2017-01-03: 200 mL via INTRA_ARTERIAL

## 2017-01-03 MED ORDER — LIDOCAINE HCL (PF) 1 % IJ SOLN
INTRAMUSCULAR | Status: AC
  Filled 2017-01-03: qty 30

## 2017-01-03 MED ORDER — ASPIRIN EC 81 MG PO TBEC: 81.0000 mg | DELAYED_RELEASE_TABLET | Freq: Every day | ORAL | Status: DC

## 2017-01-03 MED ORDER — SODIUM CHLORIDE 0.9 % IV SOLN
250.0000 mL | INTRAVENOUS | Status: DC | PRN
  Administered 2017-01-03: 250 mL via INTRAVENOUS

## 2017-01-03 MED ORDER — SODIUM CHLORIDE 0.9% FLUSH: 3.0000 mL | Freq: Two times a day (BID) | INTRAVENOUS | Status: DC

## 2017-01-03 MED ORDER — HEPARIN (PORCINE) IN NACL 2-0.9 UNIT/ML-% IJ SOLN
INTRAMUSCULAR | Status: AC
  Filled 2017-01-03: qty 1000

## 2017-01-03 MED ORDER — HALOPERIDOL 0.5 MG PO TABS: 0.5000 mg | ORAL_TABLET | Freq: Once | ORAL | Status: DC

## 2017-01-03 MED ORDER — HYDRALAZINE HCL 20 MG/ML IJ SOLN: 5.0000 mg | INTRAMUSCULAR | Status: DC | PRN

## 2017-01-03 MED ORDER — DIPHENHYDRAMINE HCL 50 MG/ML IJ SOLN
25.0000 mg | INTRAMUSCULAR | Status: AC
  Administered 2017-01-03: 25 mg via INTRAVENOUS

## 2017-01-03 MED ORDER — HEPARIN SODIUM (PORCINE) 5000 UNIT/ML IJ SOLN
5000.0000 [IU] | Freq: Three times a day (TID) | INTRAMUSCULAR | Status: DC
  Administered 2017-01-03 – 2017-01-07 (×12): 5000 [IU] via SUBCUTANEOUS
  Filled 2017-01-03 (×12): qty 1

## 2017-01-03 MED ORDER — METHYLPREDNISOLONE SODIUM SUCC 125 MG IJ SOLR
125.0000 mg | INTRAMUSCULAR | Status: AC
  Administered 2017-01-03: 125 mg via INTRAVENOUS

## 2017-01-03 MED ORDER — INSULIN ASPART 100 UNIT/ML ~~LOC~~ SOLN
0.0000 [IU] | Freq: Three times a day (TID) | SUBCUTANEOUS | Status: DC
  Administered 2017-01-04: 3 [IU] via SUBCUTANEOUS
  Administered 2017-01-04 – 2017-01-07 (×3): 1 [IU] via SUBCUTANEOUS
  Administered 2017-01-07: 2 [IU] via SUBCUTANEOUS

## 2017-01-03 SURGICAL SUPPLY — 10 items
CATH OMNI FLUSH 5F 65CM (CATHETERS) ×1 IMPLANT
COVER PRB 48X5XTLSCP FOLD TPE (BAG) IMPLANT
COVER PROBE 5X48 (BAG) ×2
KIT MICROINTRODUCER STIFF 5F (SHEATH) ×1 IMPLANT
KIT PV (KITS) ×2 IMPLANT
SHEATH PINNACLE 5F 10CM (SHEATH) ×1 IMPLANT
SYR MEDRAD MARK V 150ML (SYRINGE) ×2 IMPLANT
TRANSDUCER W/STOPCOCK (MISCELLANEOUS) ×2 IMPLANT
TRAY PV CATH (CUSTOM PROCEDURE TRAY) ×2 IMPLANT
WIRE BENTSON .035X145CM (WIRE) ×1 IMPLANT

## 2017-01-03 NOTE — Progress Notes (Signed)
Pharmacy Antibiotic Note  [name] is a 81 y.o. male admitted on 01/01/2017 with cellulitis.  Pharmacy has been consulted for vancomycin/zosyn dosing. Was recently on vancomycin during last admission. Previously was treated with doxycycline which was changed to Augmentin and vancomycin.   A pre-HD vancomhycin level drawn today was in range, but on the lower side at 2016mcg/mL. Patient was ordered to go for HD for 4.25h @ BFR 47500mL/min, but will likely not go for entire time given angiogram procedure schedule. Is tolerating HD so far this morning (3.5h so far).  Plan: Vancomycin 1000mg  IV qHD-TTSat- ok to give today's dose Zosyn 3.375g IV q12h Follow HD schedule/tolerance, vascular/surgical plans, c/s, LOT  Height: 5\' 10"  (177.8 cm) Weight: 229 lb (103.9 kg) IBW/kg (Calculated) : 73  Temp (24hrs), Avg:98.2 F (36.8 C), Min:98 F (36.7 C), Max:98.6 F (37 C)   Recent Labs Lab 12/29/16 1400 01/01/17 1241 01/01/17 1433 01/01/17 1910 01/01/17 2050 01/01/17 2301 01/02/17 0847 01/03/17 0337  WBC 6.1 8.3  --   --  7.6  --  7.3 6.7  CREATININE 12.18* 13.98*  --   --  8.80*  --  10.46* 12.08*  LATICACIDVEN  --   --  0.9 2.3*  --  2.6* 1.2  --   VANCORANDOM  --   --   --   --   --   --   --  16    Estimated Creatinine Clearance: 6 mL/min (A) (by C-G formula based on SCr of 12.08 mg/dL (H)).    Allergies  Allergen Reactions  . Iohexol Swelling and Rash  . Zanaflex [Tizanidine] Other (See Comments)    Shut kidneys down Unable to walk or void  . Ezetimibe-Simvastatin Other (See Comments)    Elevated liver enzymes  . Fluvastatin Other (See Comments)  . Simvastatin Other (See Comments)    Elevated liver enzymes    Antimicrobials this admission:  Zosyn 8/15 >>  Vancomycin 8/14 >>  * 8/16 pre-HD VR = 16, likely not getting full session since going for angiogram, but will still give 1g given level on low side  Microbiology results:  8/14 BCx: ngtd 8/8 BCx: neg 8/8 MRSA PCR:  neg   Thank you for allowing pharmacy to be a part of this patient's care.  Aijalon Kirtz D. Jefferson Fullam, PharmD, BCPS Clinical Pharmacist Pager: [phone] Clinical Phone for 01/03/2017 until 3:30pm: [phone] If after 3:30pm, please call main pharmacy at [phone] 01/03/2017 10:37 AM

## 2017-01-03 NOTE — Progress Notes (Signed)
Site area: left groin fa sheath Site Prior to Removal:  Level 0 Pressure Applied For: 20 minutes Manual:   yes Patient Status During Pull:  stable Post Pull Site:  Level 0 Post Pull Instructions Given:  Not coherant; lt leg sheeted Post Pull Pulses Present: dopplered Dressing Applied:  Gauze and tegaderm Bedrest begins @ 1340 Comments: IV saline locked

## 2017-01-03 NOTE — Progress Notes (Signed)
PROGRESS NOTE    [name]  ZOX:[id number] DOB: 04-Oct-1935 DOA: 01/01/2017 PCP: Crist Fat, MD    Brief Narrative:  81 year old male who presents with generalized weakness, altered mental status, worsening pain on the right lower extremity. Patient is known to have end-stage renal disease on hemodialysis, diabetes mellitus type 2, arthritis, hypertension, coronary disease and peripheral vascular disease. He was recently diagnosed with right lower extremity wound infection, he was placed on IV vancomycin during dialysis. He developed worsening symptoms despite IV antibiotics, increased pain and purulent discharge, associated with ambulatory dysfunction. On his initial physical examination blood pressure 125/59, heart rate 78, temperature 97.9, respiratory 14, oxygen saturation 91%. Dry mucous membranes, his lungs were clear to auscultation bilaterally, heart S1-S2 present and rhythmic, no gallops or murmurs, the abdomen was soft nontender, lower extremities with 2+ edema more right than left. Sodium 134, potassium 5.4, chloride 93, bicarbonate 27, glucose 107, BUN 44, creatinine 13.9, calcium 9.3, white count 8.3, hemoglobin 11.0, hematocrit 33.8, platelets 208. Chest x-ray with low lung volumes, right lower lobe atelectasis. EKG sinus rhythm, normal intervals.  Patient was admitted with the working diagnosis of sepsis due to right lower extremity cellulitis.   Assessment & Plan:   Principal Problem:   Ulcer of right foot due to type 2 diabetes mellitus (HCC) Active Problems:   Sepsis due to cellulitis (HCC)   Hypertension   Coronary artery disease   CKD (chronic kidney disease) stage V requiring chronic dialysis (HCC)   Peripheral vascular disease of lower extremity with ulceration/RLE cellulitis (HCC)   Acute metabolic encephalopathy   Diabetes mellitus   Diabetes mellitus with complication (HCC)   1. Right lower extremity cellulitis, complicated by sepsis, present on admission.  On antibiotic therapy with IV vancomycin and Zosyn. Will follow with vascular surgery for further recommendation patient may need surgical intervention, arteriogram with significant arterial disease but not amendable for easy interventions. Transfer to medical ward.    2. Metabolic encephalopathy. Patient responds to simple questions, continue to be confused and disorientated, not known baseline. Will have physical therapy consultation.   3. End-stage renal disease on hemodialysis. Had HD with no significant complications, will continue to follow up with nephrology recommendations.   4. Hypertension. Improved blood pressure control systolic 130 to 140. Will continue to hold on antihypertensive medications, will discontinue telemetry.   5. Type 2 diabetes mellitus. Glucose cover and monitoring with iss, patient tolerating po well.   6. Peripheral vascular disease, coronary artery disease. Continue asa for antiplatelet therapy.     DVT prophylaxis: heparin  Code Status: Full  Family Communication:  Disposition Plan:    Consultants:     Procedures:    Antimicrobials:  Vancomycin  Zosyn     Subjective: Patient feeling better, improved pain. No nausea or vomiting, no chest pain.   Objective: Vitals:   01/03/17 1320 01/03/17 1325 01/03/17 1345 01/03/17 1404  BP: (!) 150/38 106/90 (!) 166/42 (!) 144/41  Pulse:  71 95 83  Resp: 17 15 12 14   Temp:      TempSrc:      SpO2:  92% 93% 95%  Weight:      Height:        Intake/Output Summary (Last 24 hours) at 01/03/17 1436 Last data filed at 01/03/17 1137  Gross per 24 hour  Intake              220 ml  Output  2726 ml  Net            -2506 ml   Filed Weights   01/03/17 0420 01/03/17 0710 01/03/17 1137  Weight: 103.9 kg (229 lb) 103.2 kg (227 lb 8.2 oz) 100.5 kg (221 lb 9 oz)    Examination:  General exam: Deconditioned and ill looking appearing E ENT. Mild pallor, no icterus, oral mucosa  moist.   Respiratory system: Clear to auscultation. Respiratory effort normal. No wheezing, rales or rhonchi.  Cardiovascular system: S1 & S2 heard, RRR. No JVD, murmurs, rubs, gallops or clicks. ++ pedal edema. Gastrointestinal system: Abdomen is nondistended, soft and nontender. No organomegaly or masses felt. Normal bowel sounds heard. Central nervous system: Alert and oriented. No focal neurological deficits. Extremities: Symmetric 5 x 5 power. Skin: 4 and 5th right foot edema, discoloration and pus drainage.      Data Reviewed: I have personally reviewed following labs and imaging studies  CBC:  Recent Labs Lab 12/29/16 1400 01/01/17 1241 01/01/17 2050 01/02/17 0847 01/03/17 0337  WBC 6.1 8.3 7.6 7.3 6.7  NEUTROABS  --  6.3  --   --   --   HGB 10.4* 11.0* 13.3 11.1* 10.3*  HCT 31.5* 33.8* 40.7 34.1* 31.4*  MCV 95.2 97.4 97.6 97.4 97.8  PLT 149* 208 207 215 203   Basic Metabolic Panel:  Recent Labs Lab 12/28/16 0135 12/29/16 1400 01/01/17 1241 01/01/17 2050 01/02/17 0847 01/03/17 0337  NA 134* 135 134* 133* 136 136  K 5.3* 4.6 5.4* 5.2* 5.1 5.1  CL 94* 94* 93* 92* 96* 97*  CO2 23 27 27 22 26 25   GLUCOSE 80 102* 107* 97 91 83  BUN 45* 34* 44* 21* 28* 38*  CREATININE 14.45* 12.18* 13.98* 8.80* 10.46* 12.08*  CALCIUM 8.7* 9.3 9.3 9.7 9.7 9.5  MG  --   --  2.9* 2.5*  --   --   PHOS 6.8* 6.2*  --  5.2*  5.2*  --   --    GFR: Estimated Creatinine Clearance: 5.9 mL/min (A) (by C-G formula based on SCr of 12.08 mg/dL (H)). Liver Function Tests:  Recent Labs Lab 12/28/16 0135 12/29/16 1400 01/01/17 2050 01/02/17 0847  AST  --   --   --  145*  ALT  --   --   --  58  ALKPHOS  --   --   --  130*  BILITOT  --   --   --  0.6  PROT  --   --   --  6.4*  ALBUMIN 3.0* 3.0* 3.6 2.9*   No results for input(s): LIPASE, AMYLASE in the last 168 hours. No results for input(s): AMMONIA in the last 168 hours. Coagulation Profile:  Recent Labs Lab 01/01/17 1429    INR 1.10   Cardiac Enzymes: No results for input(s): CKTOTAL, CKMB, CKMBINDEX, TROPONINI in the last 168 hours. BNP (last 3 results) No results for input(s): PROBNP in the last 8760 hours. HbA1C: No results for input(s): HGBA1C in the last 72 hours. CBG: No results for input(s): GLUCAP in the last 168 hours. Lipid Profile: No results for input(s): CHOL, HDL, LDLCALC, TRIG, CHOLHDL, LDLDIRECT in the last 72 hours. Thyroid Function Tests: No results for input(s): TSH, T4TOTAL, FREET4, T3FREE, THYROIDAB in the last 72 hours. Anemia Panel: No results for input(s): VITAMINB12, FOLATE, FERRITIN, TIBC, IRON, RETICCTPCT in the last 72 hours. Sepsis Labs:  Recent Labs Lab 01/01/17 1429 01/01/17 1433 01/01/17 1910 01/01/17 2301 01/02/17  0847  PROCALCITON 1.12  --   --   --   --   LATICACIDVEN  --  0.9 2.3* 2.6* 1.2    Recent Results (from the past 240 hour(s))  Blood Cultures x 2 sites     Status: None   Collection Time: 12/26/16  4:01 AM  Result Value Ref Range Status   Specimen Description BLOOD RIGHT HAND  Final   Special Requests   Final    BOTTLES DRAWN AEROBIC AND ANAEROBIC Blood Culture adequate volume   Culture NO GROWTH 5 DAYS  Final   Report Status 12/31/2016 FINAL  Final  Blood Cultures x 2 sites     Status: None   Collection Time: 12/26/16  2:14 PM  Result Value Ref Range Status   Specimen Description BLOOD RIGHT ANTECUBITAL  Final   Special Requests   Final    BOTTLES DRAWN AEROBIC AND ANAEROBIC Blood Culture adequate volume   Culture NO GROWTH 5 DAYS  Final   Report Status 12/31/2016 FINAL  Final  MRSA PCR Screening     Status: Abnormal   Collection Time: 12/26/16  5:14 PM  Result Value Ref Range Status   MRSA by PCR INVALID RESULTS, SPECIMEN SENT FOR CULTURE (A) NEGATIVE Final    Comment: RESULT CALLED TO, READ BACK BY AND VERIFIED WITH: E.CASTRO, RN 12/27/16 0057 L.CHAMPION   MRSA culture     Status: None   Collection Time: 12/26/16  5:14 PM  Result Value  Ref Range Status   Specimen Description NASAL SWAB  Final   Special Requests NONE  Final   Culture NO MRSA DETECTED  Final   Report Status 12/28/2016 FINAL  Final  Culture, blood (x 2)     Status: None (Preliminary result)   Collection Time: 01/01/17  2:10 PM  Result Value Ref Range Status   Specimen Description BLOOD AVG  Final   Special Requests   Final    BOTTLES DRAWN AEROBIC AND ANAEROBIC Blood Culture results may not be optimal due to an excessive volume of blood received in culture bottles   Culture NO GROWTH 2 DAYS  Final   Report Status PENDING  Incomplete  Culture, blood (x 2)     Status: None (Preliminary result)   Collection Time: 01/01/17  2:25 PM  Result Value Ref Range Status   Specimen Description BLOOD AVG  Final   Special Requests   Final    BOTTLES DRAWN AEROBIC AND ANAEROBIC Blood Culture results may not be optimal due to an excessive volume of blood received in culture bottles   Culture NO GROWTH 2 DAYS  Final   Report Status PENDING  Incomplete         Radiology Studies: No results found.      Scheduled Meds: . allopurinol  100 mg Oral QPM  . aspirin EC  81 mg Oral QPM  . darbepoetin (ARANESP) injection - DIALYSIS  100 mcg Intravenous Q Thu-HD  . heparin  5,000 Units Subcutaneous Q8H  . multivitamin  1 tablet Oral QHS  . sodium chloride flush  3 mL Intravenous Q12H  . sodium chloride flush  3 mL Intravenous Q12H  . sucroferric oxyhydroxide  1,000 mg Oral TID WC  . vitamin B-12  250 mcg Oral QPM   Continuous Infusions: . sodium chloride    . piperacillin-tazobactam    . piperacillin-tazobactam (ZOSYN)  IV 3.375 g (01/03/17 0625)  . vancomycin 1,000 mg (01/03/17 1037)     LOS: 2 days  Mauricio Annett Gula, MD [facility] Pager [phone]  If 7PM-7AM, please contact night-coverage [email or web address] Password [facility] 01/03/2017, 2:36 PM

## 2017-01-03 NOTE — Consult Note (Signed)
WOC consulted for foot wounds, however it was noted that Dr. Imogene Burnhen had followed this patient after last admission when WOC first saw this patient and has recently 01/01/17 at the time of this readmission seen the patient and noted in his consultation to continue silver hydrofiber for exudate management and for antimicrobial effects.  Updated wound care orders to reflect both my and [facility] recommendations.    Re consult if needed, will not follow at this time. Thanks  Michell Giuliano [employer] MSN, RN,CWOCN, CNS, CWON-AP [phone])

## 2017-01-03 NOTE — Procedures (Signed)
Pt seen on HD.  Ap 120 Vp 200.  BFR 400.  May not get full tx as scheduled to go for angiogram at 11:30am.  He is still a little confused and Ox2.  Not sure what his baseline is??

## 2017-01-03 NOTE — H&P (View-Only) (Signed)
CONSULT NOTE   Patient name: [name] MRN: [id number] DOB: 19-Dec-1935 Sex: male  REASON FOR CONSULT: sepsis, purulent drainage right foot, consult is from Dr. Konrad Dolores  HPI:    [name] is a 81 y.o. male who presents with worsening right foot pain and altered mental status. Was seen by Dr. Lemar Livings recently on 12/28/16 for right toe ulcerations and set up for outpatient arteriogram today. He has been admitted by the hospitalist service for sepsis secondary to cellulitis and right foot infection.  He is also known to [place] from having undergone revision of left forearm loop graft by Dr. Edilia Bo on 11/16/16.   PAST MEDICAL HISTORY:   Past Medical History:  Diagnosis Date  . Anginal pain (HCC)    before CABG  . Arthritis   . CHF (congestive heart failure) (HCC)   . Constipation   . Coronary artery disease   . Diabetes mellitus   . ESRD (end stage renal disease) (HCC)    TuesThurs Sat [place]  . Hypertension   . Myocardial infarction (HCC)   . Pneumonia    2 years ago    Family History  Problem Relation Age of Onset  . Cancer Father   . Diabetes Sister     Social History  Substance Use Topics  . Smoking status: Never Smoker  . Smokeless tobacco: Current User    Types: Snuff  . Alcohol use No    Allergies  Allergen Reactions  . Iohexol Swelling and Rash  . Zanaflex [Tizanidine] Other (See Comments)    Shut kidneys down Unable to walk or void  . Ezetimibe-Simvastatin Other (See Comments)    Elevated liver enzymes  . Fluvastatin Other (See Comments)  . Simvastatin Other (See Comments)    Elevated liver enzymes    MEDICATIONS:   Current Facility-Administered Medications  Medication Dose Route Frequency Provider Last Rate Last Dose  . morphine 4 MG/ML injection 1-4 mg  1-4 mg Intravenous Q2H PRN Russella Dar, NP      . oxyCODONE (Oxy IR/ROXICODONE) immediate release tablet 5-10 mg  5-10 mg Oral Q4H PRN Russella Dar, NP      .  piperacillin-tazobactam (ZOSYN) IVPB 3.375 g  3.375 g Intravenous Once Russella Dar, NP      . sucroferric oxyhydroxide [facility]) chewable tablet 1,000 mg  1,000 mg Oral TID WC Primitivo Gauze, MD      . vancomycin (VANCOCIN) IVPB 1000 mg/200 mL premix  1,000 mg Intravenous Once Russella Dar, NP        REVIEW OF SYSTEMS:   REVIEW OF SYSTEMS (negative unless checked):   Cardiac:  []  Chest pain or chest pressure? []  Shortness of breath upon activity? []  Shortness of breath when lying flat? []  Irregular heart rhythm?  Vascular:  []  Pain in calf, thigh, or hip brought on by walking? []  Pain in feet at night that wakes you up from your sleep? []  Blood clot in your veins? []  Leg swelling?  Pulmonary:  []  Oxygen at home? []  Productive cough? []  Wheezing?  Neurologic:  []  Sudden weakness in arms or legs? []  Sudden numbness in arms or legs? []  Sudden onset of difficult speaking or slurred speech? []  Temporary loss of vision in one eye? []  Problems with dizziness?  Gastrointestinal:  []  Blood in stool? []  Vomited blood?  Genitourinary:  []  Burning when urinating? []  Blood in urine?  Psychiatric:  []  Major depression  Hematologic:  []  Bleeding problems? []  Problems  with blood clotting?  Dermatologic:  []  Rashes or ulcers?  Constitutional:  []  Fever or chills?  Ear/Nose/Throat:  []  Change in hearing? []  Nose bleeds? []  Sore throat?  Musculoskeletal:  []  Back pain? []  Joint pain? []  Muscle pain?  PHYSICAL EXAM:   Vitals:   01/01/17 1419 01/01/17 1425 01/01/17 1430 01/01/17 1500  BP: (!) 128/54 (!) 106/45 (!) 108/54 (!) 93/49  Pulse: 77 74 73 75  Resp: 15 14 12 11   Temp: 98.1 F (36.7 C)     TempSrc: Oral     SpO2: 91%     Weight:      Height:        GENERAL: The patient is a well-nourished male, in no acute distress. He is seen in HD. The vital signs are documented above. HEENT: normocephalic, atraumatic. No abnormalities noted.    CARDIAC: There is a regular rate and rhythm.  VASCULAR: Non palpable pedal pulses bilaterally. Right foot is swollen and erythematous with purulent drainage between right 4th and 5th toes.    PULMONARY: Non labored respiratory effort.  MUSCULOSKELETAL: There are no major deformities or cyanosis. NEUROLOGIC: No focal weakness or paresthesias are detected.   Laboratory   CBC CBC Latest Ref Rng & Units 01/01/2017 12/29/2016 12/28/2016  WBC 4.0 - 10.5 K/uL 8.3 6.1 6.5  Hemoglobin 13.0 - 17.0 g/dL 11.0(L) 10.4(L) 9.5(L)  Hematocrit 39.0 - 52.0 % 33.8(L) 31.5(L) 28.4(L)  Platelets 150 - 400 K/uL 208 149(L) 139(L)    BMP BMP Latest Ref Rng & Units 01/01/2017 12/29/2016 12/28/2016  Glucose 65 - 99 mg/dL 960(A107(H) 540(J102(H) 80  BUN 6 - 20 mg/dL 81(X44(H) 91(Y34(H) 78(G45(H)  Creatinine 0.61 - 1.24 mg/dL 95.62(Z13.98(H) 30.86(V12.18(H) 78.46(N14.45(H)  Sodium 135 - 145 mmol/L 134(L) 135 134(L)  Potassium 3.5 - 5.1 mmol/L 5.4(H) 4.6 5.3(H)  Chloride 101 - 111 mmol/L 93(L) 94(L) 94(L)  CO2 22 - 32 mmol/L 27 27 23   Calcium 8.9 - 10.3 mg/dL 9.3 9.3 6.2(X8.7(L)    Coagulation Lab Results  Component Value Date   INR 1.10 01/01/2017   INR 1.3 12/04/2008   INR 1.0 08/21/2007   No results found for: PTT  Lipids    Component Value Date/Time   CHOL  12/04/2008 2255    142        ATP III CLASSIFICATION:  <200     mg/dL   Desirable  528-413200-239  mg/dL   Borderline High  >=244>=240    mg/dL   High          TRIG 77 12/04/2008 2255   HDL 50 12/04/2008 2255   CHOLHDL 2.8 12/04/2008 2255   VLDL 15 12/04/2008 2255   LDLCALC  12/04/2008 2255    77        Total Cholesterol/HDL:CHD Risk Coronary Heart Disease Risk Table                     Men   Women  1/2 Average Risk   3.4   3.3  Average Risk       5.0   4.4  2 X Average Risk   9.6   7.1  3 X Average Risk  23.4   11.0        Use the calculated Patient Ratio above and the CHD Risk Table to determine the patient's CHD Risk.        ATP III CLASSIFICATION (LDL):  <100     mg/dL    Optimal  [id number]  mg/dL  Near or Above                    Optimal  130-159  mg/dL   Borderline  161-096  mg/dL   High  >045     mg/dL   Very High     ASSESSMENT/PLAN:   Right foot infection and cellulitis  Continue IV abx. Will set up for angiogram this week to evaluate blood flow to right leg and necessary level of amputation.    Maris Berger, PA-C [facility] [place]  Addendum  I have independently interviewed and examined the patient, and I agree with the physician assistant's findings.  Pt has spontaneous drainage of pus from R 5th > 4th toe with associated cellulitis.  Pt's presentation is not consistent with frank sepsis so I doubt he needs emergent guillotine amputation.    - Broad spectrum IV abx - Aquacel Ag to R foot to help absorb any drainage and sterilize the wound - Needs angiogram to determine any revascularization options and/or level of viable amputation.  Currently scheduled for Thursday - Suspect pt minimally needs transmetatarsal amputation 3-5th toes.  If infection has spread to mid-foot, he may need R BKA vs AKA - Dr. Randie Heinz to see patient in AM   Leonides Sake, MD, FACS [facility] [place] Office: [phone] Pager: [phone]  01/01/2017, 3:47 PM

## 2017-01-03 NOTE — Progress Notes (Signed)
   Daily Progress Note   Angiogram demonstrates several tibial artery disease in both legs not amendable to any easy interventions.  Dr. Randie Heinzain will review the images to determine any further options.   Leonides SakeBrian Anakin Varkey, MD, FACS [facility] [place] Office: [phone] Pager: [phone]  01/03/2017, 1:17 PM

## 2017-01-03 NOTE — Progress Notes (Signed)
Pt returned from cath lab. Pt oriented only to self. Neuro assessment complete. Call bell within reach. Bed in lowest position. Bed alarm on. Telemetry applied. VSS.   Leonidas Rombergaitlin S Bumbledare, RN

## 2017-01-03 NOTE — Interval H&P Note (Signed)
History and Physical Interval Note:  01/03/2017 9:25 AM  [name]  has presented today for surgery, with the diagnosis of pvd with right foot infection  The various methods of treatment have been discussed with the patient and family. After consideration of risks, benefits and other options for treatment, the patient has consented to  Procedure(s): ABDOMINAL AORTOGRAM W/LOWER EXTREMITY (N/A) as a surgical intervention .  The patient's history has been reviewed, patient examined, no change in status, stable for surgery.  I have reviewed the patient's chart and labs.  Questions were answered to the patient's satisfaction.     Leonides SakeBrian Troyce Febo

## 2017-01-03 NOTE — Progress Notes (Signed)
  Progress Note    01/03/2017 7:47 AM * No surgery date entered *  Subjective: on hd this a.m.  Vitals:   01/03/17 0722 01/03/17 0727  BP: (!) (P) 106/27 (!) (P) 122/33  Pulse: (P) 63 (P) 66  Resp:    Temp:    SpO2:      Physical Exam: aaox3 Palpable femoral pulses Stable ulceration of right toes 2-4  CBC    Component Value Date/Time   WBC 6.7 01/03/2017 0337   RBC 3.21 (L) 01/03/2017 0337   HGB 10.3 (L) 01/03/2017 0337   HCT 31.4 (L) 01/03/2017 0337   PLT 203 01/03/2017 0337   MCV 97.8 01/03/2017 0337   MCH 32.1 01/03/2017 0337   MCHC 32.8 01/03/2017 0337   RDW 14.8 01/03/2017 0337   LYMPHSABS 0.9 01/01/2017 1241   MONOABS 0.8 01/01/2017 1241   EOSABS 0.3 01/01/2017 1241   BASOSABS 0.0 01/01/2017 1241    BMET    Component Value Date/Time   NA 136 01/03/2017 0337   K 5.1 01/03/2017 0337   CL 97 (L) 01/03/2017 0337   CO2 25 01/03/2017 0337   GLUCOSE 83 01/03/2017 0337   BUN 38 (H) 01/03/2017 0337   CREATININE 12.08 (H) 01/03/2017 0337   CALCIUM 9.5 01/03/2017 0337   CALCIUM 8.4 11/29/2007 1515   GFRNONAA 3 (L) 01/03/2017 0337   GFRAA 4 (L) 01/03/2017 0337    INR    Component Value Date/Time   INR 1.10 01/01/2017 1429     Intake/Output Summary (Last 24 hours) at 01/03/17 0747 Last data filed at 01/02/17 1800  Gross per 24 hour  Intake              340 ml  Output                0 ml  Net              340 ml     Assessment:  81 y.o. male is here with ulceration of right toes currently on hd  Plan: Angiogram today with Dr. Sena Slatehen Silver hydrofiber weave to affected toes   Camyah Pultz C. Randie Heinzain, MD [facility] [place] Office: [phone] Pager: [phone]  01/03/2017 7:47 AM

## 2017-01-03 NOTE — Op Note (Signed)
OPERATIVE NOTE   PROCEDURE: 1.  left common femoral artery cannulation under ultrasound guidance 2.  Aortogram 3.  Bilateral runoff via catheter  PRE-OPERATIVE DIAGNOSIS: right foot ulcers  POST-OPERATIVE DIAGNOSIS: same as above   SURGEON: Leonides SakeBrian Bryanna Yim, MD  ANESTHESIA: conscious sedation  ESTIMATED BLOOD LOSS: 50 cc  CONTRAST: 200 cc  FINDING(S):  Aorta: patent, heavily calcified  Superior mesenteric artery: patent Celiac artery: patent   Right Left  RA Small patent proximal segment with occlusion at first branch branch point Small patent proximal segment with occlusion at first branch branch point  CIA Patent, calcified Patent, calcified  EIA Patent, calcified Patent, calcified  IIA Patent, calcified, sub-total occlusion at bifurcation Patent, calcified, sub-total occlusion at bifurcation  CFA Patent, calcfied Patent, calcfied    SFA Patent, calcfied, proximal 1/3 30% stenosis   Patent, calcfied, proximal 1/3 30% stenosis    PFA Patent patent  Pop Patent with proximal stenosis 50-75% Patent  Trif Occluded Diseased with occluded distal tibioperoneal trunk   AT Occluded, collaterals reconstitute a diseased distal segment Occluded shortly after takeoff  Pero Occluded Occludes shortly after takeoff  PT Occluded Occludes in mid-segment    SPECIMEN(S):  none  INDICATIONS:   [name] is a 81 y.o. male who presents with right foot ulcer.  The patient presents for: aortogram, bilateral leg runoff.  I discussed with the patient the nature of angiographic procedures, especially the limited patencies of any endovascular intervention.  The patient is aware of that the risks of an angiographic procedure include but are not limited to: bleeding, infection, access site complications, renal failure, embolization, rupture of vessel, dissection, possible need for emergent surgical intervention, possible need for surgical procedures to treat the patient's pathology, and stroke  and death.  The patient is aware of the risks and agrees to proceed.  DESCRIPTION: After full informed consent was obtained from the patient, the patient was brought back to the angiography suite.  The patient was placed supine upon the angiography table and connected to monitoring equipment.  The patient was then given pre-medication for his contrast allergery (Solumedrol, Benadyl, and Pepcid) the amounts of which are documented in the patient's chart.  Note, this patient was not oriented at the beginning of this case and required his wife to sign his consent.  He was not able to follow instructions throughout this case.    The patient was prepped and drape in the standard fashion for an angiographic procedure.  At this point, attention was turned to the left groin. Under ultrasound guidance, the subcutaneous tissue surrounding the left common femoral artery was anesthesized with 1% lidocaine with epinephrine.  The artery was then cannulated with a micropuncture needle.  The microwire was advanced into the iliac arterial system.  The needle was exchanged for a microsheath, which was loaded into the common femoral artery over the wire.  The microwire was exchanged for a Bentson wire which was advanced into the aorta.  The microsheath was then exchanged for a 5-Fr sheath which was loaded into the common femoral artery.  The Omniflush catheter was then loaded over the wire up to the level of L1.  The catheter was connected to the power injector circuit.  After de-airring and de-clotting the circuit, a power injector aortogram was completed.  The findings are listed above.  At this point, I pulled the catheter down to the distal aorta.  A bilateral leg runoff was completed in stations.  The findings are listed  above.  Based on the images, no intervention is possible.  The family and patient will need to consider a palliative right below-knee amputation vs above-knee amputation.   COMPLICATIONS:  none  CONDITION: stable   Leonides Sake, MD [facility] Office: [phone] Pager: [phone]  01/03/2017, 1:04 PM

## 2017-01-04 LAB — CBC
HEMATOCRIT: 33.3 % — AB (ref 39.0–52.0)
HEMOGLOBIN: 11.2 g/dL — AB (ref 13.0–17.0)
MCH: 32.3 pg (ref 26.0–34.0)
MCHC: 33.6 g/dL (ref 30.0–36.0)
MCV: 96 fL (ref 78.0–100.0)
Platelets: 242 10*3/uL (ref 150–400)
RBC: 3.47 MIL/uL — ABNORMAL LOW (ref 4.22–5.81)
RDW: 14.5 % (ref 11.5–15.5)
WBC: 8 10*3/uL (ref 4.0–10.5)

## 2017-01-04 LAB — GLUCOSE, CAPILLARY
GLUCOSE-CAPILLARY: 204 mg/dL — AB (ref 65–99)
GLUCOSE-CAPILLARY: 132 mg/dL — AB (ref 65–99)
Glucose-Capillary: 135 mg/dL — ABNORMAL HIGH (ref 65–99)
Glucose-Capillary: 145 mg/dL — ABNORMAL HIGH (ref 65–99)

## 2017-01-04 LAB — BASIC METABOLIC PANEL
ANION GAP: 16 — AB (ref 5–15)
BUN: 29 mg/dL — ABNORMAL HIGH (ref 6–20)
CO2: 25 mmol/L (ref 22–32)
Calcium: 9.2 mg/dL (ref 8.9–10.3)
Chloride: 95 mmol/L — ABNORMAL LOW (ref 101–111)
Creatinine, Ser: 8.74 mg/dL — ABNORMAL HIGH (ref 0.61–1.24)
GFR, EST AFRICAN AMERICAN: 6 mL/min — AB (ref >60)
GFR, EST NON AFRICAN AMERICAN: 5 mL/min — AB (ref >60)
Glucose, Bld: 138 mg/dL — ABNORMAL HIGH (ref 65–99)
Potassium: 5.1 mmol/L (ref 3.5–5.1)
SODIUM: 136 mmol/L (ref 135–145)

## 2017-01-04 NOTE — Progress Notes (Addendum)
  Progress Note    01/04/2017 7:47 AM 1 Day Post-Op  Subjective:  No acute issues  Vitals:   01/03/17 2024 01/04/17 0517  BP: (!) 146/69 (!) 112/51  Pulse:  64  Resp: 16   Temp: 98.8 F (37.1 C) (!) 97.4 F (36.3 C)  SpO2: 100% 100%    Physical Exam: Awake and alert Right foot with stable ulceration Left groin is soft  CBC    Component Value Date/Time   WBC 8.0 01/04/2017 0228   RBC 3.47 (L) 01/04/2017 0228   HGB 11.2 (L) 01/04/2017 0228   HCT 33.3 (L) 01/04/2017 0228   PLT 242 01/04/2017 0228   MCV 96.0 01/04/2017 0228   MCH 32.3 01/04/2017 0228   MCHC 33.6 01/04/2017 0228   RDW 14.5 01/04/2017 0228   LYMPHSABS 0.9 01/01/2017 1241   MONOABS 0.8 01/01/2017 1241   EOSABS 0.3 01/01/2017 1241   BASOSABS 0.0 01/01/2017 1241    BMET    Component Value Date/Time   NA 136 01/04/2017 0228   K 5.1 01/04/2017 0228   CL 95 (L) 01/04/2017 0228   CO2 25 01/04/2017 0228   GLUCOSE 138 (H) 01/04/2017 0228   BUN 29 (H) 01/04/2017 0228   CREATININE 8.74 (H) 01/04/2017 0228   CALCIUM 9.2 01/04/2017 0228   CALCIUM 8.4 11/29/2007 1515   GFRNONAA 5 (L) 01/04/2017 0228   GFRAA 6 (L) 01/04/2017 0228    INR    Component Value Date/Time   INR 1.10 01/01/2017 1429     Intake/Output Summary (Last 24 hours) at 01/04/17 0747 Last data filed at 01/03/17 1500  Gross per 24 hour  Intake              200 ml  Output             2726 ml  Net            -2526 ml     Assessment:  81 y.o. male is s/p diagnostic angiogram yesterday  Plan: Will need to have either wound care vs complex endovascular procedure. Will attempt to discuss with family today   Apolinar Junes C. Randie Heinz, MD [facility] Office: [phone] Pager: [phone]  01/04/2017 7:47 AM  Addendum: I have discussed with patient's wife Tyler Aas and given complexity of his situation and current status of his wounds not progressing and actually improving, we will plan to have him f/u in  office in a few weeks and re-evaluate at that time. If wounds are progressing his only options will be primary amputation vs retrograde access procedure for revascularization. She demonstrates good understanding.   Catalia Massett C. Randie Heinz, MD

## 2017-01-04 NOTE — Care Management Important Message (Signed)
Important Message  Patient Details  Name: [name] MRN: [id number] Date of Birth: [birth year]   Medicare Important Message Given:  Yes    [name] 01/04/2017, 11:09 AM

## 2017-01-04 NOTE — Progress Notes (Signed)
S: No new CO.  Feels hungry.  He does not appear to be aware of angio results O:BP (!) 112/51 (BP Location: Right Arm)   Pulse 64   Temp (!) 97.4 F (36.3 C) (Axillary)   Resp 16   Ht 5\' 10"  (1.778 m)   Wt 100.6 kg (221 lb 12.5 oz)   SpO2 100%   BMI 31.82 kg/m   Intake/Output Summary (Last 24 hours) at 01/04/17 0729 Last data filed at 01/03/17 1500  Gross per 24 hour  Intake              200 ml  Output             2726 ml  Net            -2526 ml   Weight change: -0.674 kg (-1 lb 7.8 oz) Gen: Awake and alert CVS: RRR Resp:Clear Abd:+ BS NTND Ext: Rt foot with ischemic changes, Erythema is less and swelling improved NEURO: CNI, orientation is improved but still does not know the year   . allopurinol  100 mg Oral QPM  . aspirin EC  81 mg Oral QPM  . darbepoetin (ARANESP) injection - DIALYSIS  100 mcg Intravenous Q Thu-HD  . heparin  5,000 Units Subcutaneous Q8H  . insulin aspart  0-9 Units Subcutaneous TID WC  . multivitamin  1 tablet Oral QHS  . sodium chloride flush  3 mL Intravenous Q12H  . sodium chloride flush  3 mL Intravenous Q12H  . sucroferric oxyhydroxide  1,000 mg Oral TID WC  . vitamin B-12  250 mcg Oral QPM   No results found. BMET    Component Value Date/Time   NA 136 01/04/2017 0228   K 5.1 01/04/2017 0228   CL 95 (L) 01/04/2017 0228   CO2 25 01/04/2017 0228   GLUCOSE 138 (H) 01/04/2017 0228   BUN 29 (H) 01/04/2017 0228   CREATININE 8.74 (H) 01/04/2017 0228   CALCIUM 9.2 01/04/2017 0228   CALCIUM 8.4 11/29/2007 1515   GFRNONAA 5 (L) 01/04/2017 0228   GFRAA 6 (L) 01/04/2017 0228   CBC    Component Value Date/Time   WBC 8.0 01/04/2017 0228   RBC 3.47 (L) 01/04/2017 0228   HGB 11.2 (L) 01/04/2017 0228   HCT 33.3 (L) 01/04/2017 0228   PLT 242 01/04/2017 0228   MCV 96.0 01/04/2017 0228   MCH 32.3 01/04/2017 0228   MCHC 33.6 01/04/2017 0228   RDW 14.5 01/04/2017 0228   LYMPHSABS 0.9 01/01/2017 1241   MONOABS 0.8 01/01/2017 1241   EOSABS 0.3  01/01/2017 1241   BASOSABS 0.0 01/01/2017 1241     Assessment:  1. Ischemic Rt foot with cellulitis, cellulitis improving 2. Anemia 3. Sec HPTH on parsabiv at outpt but not available inpt 4. DM  Not on insulin 5. HTN 6. ESRD  TTS [employer]   Plan: 1. HD tomorrow 2. Await final recs from [facility]   Elah Avellino T

## 2017-01-04 NOTE — Progress Notes (Signed)
PROGRESS NOTE    [name]  VHQ:[id number] DOB: 23-Jul-1935 DOA: 01/01/2017 PCP: Crist Fat, MD    Brief Narrative:  81 year old male who presents with generalized weakness, altered mental status, worsening pain on the right lower extremity. Patient is known to have end-stage renal disease on hemodialysis, diabetes mellitus type 2, arthritis, hypertension, coronary disease and peripheral vascular disease. He was recently diagnosed with right lower extremity wound infection, he was placed on IV vancomycin during dialysis. He developed worsening symptoms despite IV antibiotics, increased pain and purulent discharge, associated with ambulatory dysfunction. On his initial physical examination blood pressure 125/59, heart rate 78, temperature 97.9, respiratory 14, oxygen saturation 91%. Dry mucous membranes, his lungs were clear to auscultation bilaterally, heart S1-S2 present and rhythmic, no gallops or murmurs, the abdomen was soft nontender, lower extremities with 2+ edema more right than left. Sodium 134, potassium 5.4, chloride 93, bicarbonate 27, glucose 107, BUN 44, creatinine 13.9, calcium 9.3, white count 8.3, hemoglobin 11.0, hematocrit 33.8, platelets 208. Chest x-ray with low lung volumes, right lower lobe atelectasis. EKG sinus rhythm, normal intervals.  Patient was admitted with the working diagnosis of sepsis due to right lower extremity cellulitis.  Significant peripheral vascular disease, per angiography.    Assessment & Plan:   Principal Problem:   Ulcer of right foot due to type 2 diabetes mellitus (HCC) Active Problems:   Sepsis due to cellulitis (HCC)   Hypertension   Coronary artery disease   CKD (chronic kidney disease) stage V requiring chronic dialysis (HCC)   Peripheral vascular disease of lower extremity with ulceration/RLE cellulitis (HCC)   Acute metabolic encephalopathy   Diabetes mellitus   Diabetes mellitus with complication (HCC)    1. Right lower  extremity cellulitis, complicated by sepsis, present on admission. Continue antibiotic therapy with IV vancomycin and Zosyn. Case discussed with vascular surgery, patient will need complex intervention, he will be reassessed as outpatient in 2 weeks, will plant to discharge on oral antibiotics, will hold on further imaging for now.   2. Metabolic encephalopathy. Today, patient more alert, but confused and disorientated, will continue neuro checks per unit protocol and fall precautions, physical therapy evaluation.    3. End-stage renal disease on hemodialysis. Patient normovolemic, will continue to follow on nephrology recommendations.   4. Hypertension. Stable blood pressure with systolic 112 to 146, will continue to hold on antihypertensive agents, for now.  5. Type 2 diabetes mellitus. Glucose cover and monitoring with iss, patient tolerating po well. Capillary glucose 128, 135, 145, 204.   6. Peripheral vascular disease, coronary artery disease.  Asa for antiplatelet therapy.     DVT prophylaxis:heparin  Code Status:Full  Family Communication: Disposition Plan:   Consultants:    Procedures:   Antimicrobials:  Vancomycin  Zosyn    Subjective: Patient confused and disorientated, reports improvement on foot pain and edema, no chest pain, no dyspnea, no nausea or vomiting.   Objective: Vitals:   01/03/17 1800 01/03/17 1842 01/03/17 2024 01/04/17 0517  BP: (!) 130/53 (!) 129/55 (!) 146/69 (!) 112/51  Pulse: 91   64  Resp: 14 13 16    Temp:   98.8 F (37.1 C) (!) 97.4 F (36.3 C)  TempSrc:   Oral Axillary  SpO2:  100% 100% 100%  Weight:    100.6 kg (221 lb 12.5 oz)  Height:        Intake/Output Summary (Last 24 hours) at 01/04/17 1247 Last data filed at 01/03/17 1500  Gross per 24  hour  Intake              200 ml  Output                0 ml  Net              200 ml   Filed Weights   01/03/17 0710 01/03/17 1137 01/04/17 0517  Weight:  103.2 kg (227 lb 8.2 oz) 100.5 kg (221 lb 9 oz) 100.6 kg (221 lb 12.5 oz)    Examination:  General exam: Deconditioned, not in pain or dyspnea E ENT. Mild pallor, no icterus, oral mucosa moist.   Respiratory system: Clear to auscultation. Respiratory effort normal. Cardiovascular system: S1 & S2 heard, RRR. No JVD, murmurs, rubs, gallops or clicks. No pedal edema. Gastrointestinal system: Abdomen is nondistended, soft and nontender. No organomegaly or masses felt. Normal bowel sounds heard. Central nervous system: Alert, but confused. No focal neurological deficits. Extremities: right foot with mild edema, dressing in place.  Skin: No rashes, lesions or ulcers     Data Reviewed: I have personally reviewed following labs and imaging studies  CBC:  Recent Labs Lab 01/01/17 1241 01/01/17 2050 01/02/17 0847 01/03/17 0337 01/04/17 0228  WBC 8.3 7.6 7.3 6.7 8.0  NEUTROABS 6.3  --   --   --   --   HGB 11.0* 13.3 11.1* 10.3* 11.2*  HCT 33.8* 40.7 34.1* 31.4* 33.3*  MCV 97.4 97.6 97.4 97.8 96.0  PLT 208 207 215 203 242   Basic Metabolic Panel:  Recent Labs Lab 12/29/16 1400 01/01/17 1241 01/01/17 2050 01/02/17 0847 01/03/17 0337 01/04/17 0228  NA 135 134* 133* 136 136 136  K 4.6 5.4* 5.2* 5.1 5.1 5.1  CL 94* 93* 92* 96* 97* 95*  CO2 27 27 22 26 25 25   GLUCOSE 102* 107* 97 91 83 138*  BUN 34* 44* 21* 28* 38* 29*  CREATININE 12.18* 13.98* 8.80* 10.46* 12.08* 8.74*  CALCIUM 9.3 9.3 9.7 9.7 9.5 9.2  MG  --  2.9* 2.5*  --   --   --   PHOS 6.2*  --  5.2*  5.2*  --   --   --    GFR: Estimated Creatinine Clearance: 8.1 mL/min (A) (by C-G formula based on SCr of 8.74 mg/dL (H)). Liver Function Tests:  Recent Labs Lab 12/29/16 1400 01/01/17 2050 01/02/17 0847  AST  --   --  145*  ALT  --   --  58  ALKPHOS  --   --  130*  BILITOT  --   --  0.6  PROT  --   --  6.4*  ALBUMIN 3.0* 3.6 2.9*   No results for input(s): LIPASE, AMYLASE in the last 168 hours. No results  for input(s): AMMONIA in the last 168 hours. Coagulation Profile:  Recent Labs Lab 01/01/17 1429  INR 1.10   Cardiac Enzymes: No results for input(s): CKTOTAL, CKMB, CKMBINDEX, TROPONINI in the last 168 hours. BNP (last 3 results) No results for input(s): PROBNP in the last 8760 hours. HbA1C: No results for input(s): HGBA1C in the last 72 hours. CBG:  Recent Labs Lab 01/03/17 1629 01/03/17 2053 01/04/17 0633 01/04/17 1140  GLUCAP 105* 128* 135* 145*   Lipid Profile: No results for input(s): CHOL, HDL, LDLCALC, TRIG, CHOLHDL, LDLDIRECT in the last 72 hours. Thyroid Function Tests: No results for input(s): TSH, T4TOTAL, FREET4, T3FREE, THYROIDAB in the last 72 hours. Anemia Panel: No results for input(s): VITAMINB12,  FOLATE, FERRITIN, TIBC, IRON, RETICCTPCT in the last 72 hours. Sepsis Labs:  Recent Labs Lab 01/01/17 1429 01/01/17 1433 01/01/17 1910 01/01/17 2301 01/02/17 0847  PROCALCITON 1.12  --   --   --   --   LATICACIDVEN  --  0.9 2.3* 2.6* 1.2    Recent Results (from the past 240 hour(s))  Blood Cultures x 2 sites     Status: None   Collection Time: 12/26/16  4:01 AM  Result Value Ref Range Status   Specimen Description BLOOD RIGHT HAND  Final   Special Requests   Final    BOTTLES DRAWN AEROBIC AND ANAEROBIC Blood Culture adequate volume   Culture NO GROWTH 5 DAYS  Final   Report Status 12/31/2016 FINAL  Final  Blood Cultures x 2 sites     Status: None   Collection Time: 12/26/16  2:14 PM  Result Value Ref Range Status   Specimen Description BLOOD RIGHT ANTECUBITAL  Final   Special Requests   Final    BOTTLES DRAWN AEROBIC AND ANAEROBIC Blood Culture adequate volume   Culture NO GROWTH 5 DAYS  Final   Report Status 12/31/2016 FINAL  Final  MRSA PCR Screening     Status: Abnormal   Collection Time: 12/26/16  5:14 PM  Result Value Ref Range Status   MRSA by PCR INVALID RESULTS, SPECIMEN SENT FOR CULTURE (A) NEGATIVE Final    Comment: RESULT CALLED  TO, READ BACK BY AND VERIFIED WITH: E.CASTRO, RN 12/27/16 0057 L.CHAMPION   MRSA culture     Status: None   Collection Time: 12/26/16  5:14 PM  Result Value Ref Range Status   Specimen Description NASAL SWAB  Final   Special Requests NONE  Final   Culture NO MRSA DETECTED  Final   Report Status 12/28/2016 FINAL  Final  Culture, blood (x 2)     Status: None (Preliminary result)   Collection Time: 01/01/17  2:10 PM  Result Value Ref Range Status   Specimen Description BLOOD AVG  Final   Special Requests   Final    BOTTLES DRAWN AEROBIC AND ANAEROBIC Blood Culture results may not be optimal due to an excessive volume of blood received in culture bottles   Culture NO GROWTH 2 DAYS  Final   Report Status PENDING  Incomplete  Culture, blood (x 2)     Status: None (Preliminary result)   Collection Time: 01/01/17  2:25 PM  Result Value Ref Range Status   Specimen Description BLOOD AVG  Final   Special Requests   Final    BOTTLES DRAWN AEROBIC AND ANAEROBIC Blood Culture results may not be optimal due to an excessive volume of blood received in culture bottles   Culture NO GROWTH 2 DAYS  Final   Report Status PENDING  Incomplete         Radiology Studies: No results found.      Scheduled Meds: . allopurinol  100 mg Oral QPM  . aspirin EC  81 mg Oral QPM  . darbepoetin (ARANESP) injection - DIALYSIS  100 mcg Intravenous Q Thu-HD  . heparin  5,000 Units Subcutaneous Q8H  . insulin aspart  0-9 Units Subcutaneous TID WC  . multivitamin  1 tablet Oral QHS  . sodium chloride flush  3 mL Intravenous Q12H  . sodium chloride flush  3 mL Intravenous Q12H  . sucroferric oxyhydroxide  1,000 mg Oral TID WC  . vitamin B-12  250 mcg Oral QPM   Continuous Infusions: .  sodium chloride 250 mL (01/03/17 1851)  . piperacillin-tazobactam    . piperacillin-tazobactam (ZOSYN)  IV Stopped (01/04/17 0949)  . vancomycin Stopped (01/03/17 1439)     LOS: 3 days        Mauricio Annett Gula, MD [facility] Pager [phone]  If 7PM-7AM, please contact night-coverage [email or web address] Password [facility] 01/04/2017, 12:47 PM

## 2017-01-05 LAB — BASIC METABOLIC PANEL
ANION GAP: 17 — AB (ref 5–15)
BUN: 54 mg/dL — ABNORMAL HIGH (ref 6–20)
CHLORIDE: 93 mmol/L — AB (ref 101–111)
CO2: 25 mmol/L (ref 22–32)
Calcium: 9.7 mg/dL (ref 8.9–10.3)
Creatinine, Ser: 11.32 mg/dL — ABNORMAL HIGH (ref 0.61–1.24)
GFR calc Af Amer: 4 mL/min — ABNORMAL LOW (ref >60)
GFR calc non Af Amer: 4 mL/min — ABNORMAL LOW (ref >60)
GLUCOSE: 125 mg/dL — AB (ref 65–99)
POTASSIUM: 4.7 mmol/L (ref 3.5–5.1)
Sodium: 135 mmol/L (ref 135–145)

## 2017-01-05 LAB — CBC WITH DIFFERENTIAL/PLATELET
Basophils Absolute: 0 10*3/uL (ref 0.0–0.1)
Basophils Relative: 0 %
Eosinophils Absolute: 0.1 10*3/uL (ref 0.0–0.7)
Eosinophils Relative: 1 %
HEMATOCRIT: 34.1 % — AB (ref 39.0–52.0)
HEMOGLOBIN: 11.4 g/dL — AB (ref 13.0–17.0)
LYMPHS PCT: 12 %
Lymphs Abs: 1.4 10*3/uL (ref 0.7–4.0)
MCH: 32 pg (ref 26.0–34.0)
MCHC: 33.4 g/dL (ref 30.0–36.0)
MCV: 95.8 fL (ref 78.0–100.0)
MONO ABS: 0.6 10*3/uL (ref 0.1–1.0)
MONOS PCT: 5 %
NEUTROS ABS: 9.1 10*3/uL — AB (ref 1.7–7.7)
Neutrophils Relative %: 82 %
Platelets: 259 10*3/uL (ref 150–400)
RBC: 3.56 MIL/uL — ABNORMAL LOW (ref 4.22–5.81)
RDW: 14.6 % (ref 11.5–15.5)
WBC: 11.2 10*3/uL — ABNORMAL HIGH (ref 4.0–10.5)

## 2017-01-05 LAB — GLUCOSE, CAPILLARY
GLUCOSE-CAPILLARY: 79 mg/dL (ref 65–99)
GLUCOSE-CAPILLARY: 115 mg/dL — AB (ref 65–99)
Glucose-Capillary: 97 mg/dL (ref 65–99)

## 2017-01-05 MED ORDER — HEPARIN 1000 UNIT/ML FOR PERITONEAL DIALYSIS
7500.0000 [IU] | Freq: Once | INTRAMUSCULAR | Status: DC
  Filled 2017-01-05: qty 7.5

## 2017-01-05 MED ORDER — OXYCODONE HCL 5 MG PO TABS
ORAL_TABLET | ORAL | Status: AC
  Filled 2017-01-05: qty 2

## 2017-01-05 MED ORDER — VANCOMYCIN HCL IN DEXTROSE 1-5 GM/200ML-% IV SOLN
INTRAVENOUS | Status: AC
  Filled 2017-01-05: qty 200

## 2017-01-05 MED ORDER — HEPARIN 1000 UNIT/ML FOR PERITONEAL DIALYSIS
7500.0000 [IU] | Freq: Once | INTRAMUSCULAR | Status: AC
  Administered 2017-01-05: 7500 [IU] via INTRAVENOUS_CENTRAL
  Filled 2017-01-05: qty 7.5

## 2017-01-05 NOTE — Progress Notes (Signed)
Pharmacy Antibiotic Note ALGIA TRAPASSO is a 81 y.o. male admitted on 01/01/2017 with cellulitis.  Pharmacy following for vancomycin/zosyn dosing.   Plan: 1. Continue Vancomycin 1000mg  IV qHD-TTSat 2. Continue Zosyn 3.375g IV q12h 3. Follow HD schedule/tolerance, vascular/surgical plans, c/s, LOT  Height: 5\' 10"  (177.8 cm) Weight: 198 lb 6.6 oz (90 kg) (bed needs calibration) IBW/kg (Calculated) : 73  Temp (24hrs), Avg:97.9 F (36.6 C), Min:97.4 F (36.3 C), Max:98.1 F (36.7 C)   Recent Labs Lab 01/01/17 1433 01/01/17 1910 01/01/17 2050 01/01/17 2301 01/02/17 0847 01/03/17 0337 01/04/17 0228 01/05/17 0345  WBC  --   --  7.6  --  7.3 6.7 8.0 11.2*  CREATININE  --   --  8.80*  --  10.46* 12.08* 8.74* 11.32*  LATICACIDVEN 0.9 2.3*  --  2.6* 1.2  --   --   --   VANCORANDOM  --   --   --   --   --  16  --   --     Estimated Creatinine Clearance: 6 mL/min (A) (by C-G formula based on SCr of 11.32 mg/dL (H)).    Allergies  Allergen Reactions  . Iohexol Swelling and Rash  . Zanaflex [Tizanidine] Other (See Comments)    Shut kidneys down Unable to walk or void  . Ezetimibe-Simvastatin Other (See Comments)    Elevated liver enzymes  . Fluvastatin Other (See Comments)  . Simvastatin Other (See Comments)    Elevated liver enzymes    Antimicrobials this admission:  Zosyn 8/15 >>  Vancomycin 8/14 >>  * 8/16 pre-HD VR = 16, likely not getting full session since going for angiogram, but will still give 1g given level on low side  Microbiology results:  8/14 BCx: ngtd 8/8 BCx: neg 8/8 MRSA PCR: neg   Thank you for allowing pharmacy to be a part of this patient's care.  Pollyann Samples, PharmD, BCPS 01/05/2017, 10:09 AM

## 2017-01-05 NOTE — Evaluation (Signed)
Physical Therapy Evaluation Patient Details Name: [name] MRN: [id number] DOB: [birth year] Today's Date: 01/05/2017   History of Present Illness  Patient is a 81 yo male admitted 01/01/17 with AMS, weakness, RLE pain.  Patient with sepsis due to RLE cellulitis.     PMH:  CAD, MI, CABG, ESRD on HD, HOH, arthritis, HTN, PVD     Clinical Impression  Patient presents with problems listed below.  Will benefit from acute PT to maximize functional mobility prior to discharge.  Patient was independent with RW pta.  He is currently requiring +2 assist/mod assist with mobility and gait.  Do not feel wife can provide assistance required by patient at this time.  Recommend SNF at d/c for continued therapy.    Follow Up Recommendations SNF;Supervision for mobility/OOB    Equipment Recommendations  None recommended by PT    Recommendations for Other Services       Precautions / Restrictions Precautions Precautions: Fall Restrictions Weight Bearing Restrictions: No      Mobility  Bed Mobility Overal bed mobility: Needs Assistance Bed Mobility: Supine to Sit;Sit to Supine     Supine to sit: Min assist Sit to supine: Min assist   General bed mobility comments: Assist to bring trunk upright, and to bring LE's onto bed.  Transfers Overall transfer level: Needs assistance Equipment used: Rolling walker (2 wheeled);1 person hand held assist Transfers: Sit to/from Stand Sit to Stand: Mod assist         General transfer comment: Verbal cues for hand placement.  Assist to rise to standing from bed and toilet.  Assist for balance.   Ambulation/Gait Ambulation/Gait assistance: Mod assist;+2 safety/equipment Ambulation Distance (Feet): 24 Feet (12 x 2 with seated break) Assistive device: Rolling walker (2 wheeled) Gait Pattern/deviations: Step-through pattern;Decreased step length - right;Decreased step length - left;Decreased stride length;Shuffle;Drifts right/left;Trunk flexed Gait  velocity: decreased Gait velocity interpretation: Below normal speed for age/gender General Gait Details: Repeated verbal cues to stay close to RW during gait.  Cues to stand upright.  Assist to maneuver RW during turns.  Need assist for IV and for chair/safety.  Stairs            Wheelchair Mobility    Modified Rankin (Stroke Patients Only)       Balance Overall balance assessment: Needs assistance Sitting-balance support: No upper extremity supported;Feet supported Sitting balance-Leahy Scale: Fair     Standing balance support: Bilateral upper extremity supported Standing balance-Leahy Scale: Poor                               Pertinent Vitals/Pain Pain Assessment: No/denies pain    Home Living Family/patient expects to be discharged to:: Private residence Living Arrangements: Spouse/significant other Available Help at Discharge: Family;Available 24 hours/day Type of Home: House Home Access: Stairs to enter Entrance Stairs-Rails: Doctor, general practice of Steps: 8 Home Layout: One level Home Equipment: Cane - single point;Walker - 2 wheels;Bedside commode;Wheelchair - manual Additional Comments: Information from patient - unsure of accuracy.    Prior Function Level of Independence: Independent with assistive device(s);Needs assistance   Gait / Transfers Assistance Needed: Per patient, he uses cane when needed.  Per chart, patient uses RW.  ADL's / Homemaking Assistance Needed: Patient initially reports he is independent with ADL's and drives.  But then states his wife helps him "with everything".  Comments: Unsure of what is accurate information. Patient states not to call his  wife (don't worry her).     Hand Dominance   Dominant Hand: Right    Extremity/Trunk Assessment   Upper Extremity Assessment Upper Extremity Assessment: Generalized weakness    Lower Extremity Assessment Lower Extremity Assessment: Generalized weakness     Cervical / Trunk Assessment Cervical / Trunk Assessment: Kyphotic  Communication   Communication: HOH  Cognition Arousal/Alertness: Awake/alert Behavior During Therapy: Flat affect Overall Cognitive Status: No family/caregiver present to determine baseline cognitive functioning Area of Impairment: Orientation;Memory;Following commands;Safety/judgement;Problem solving                 Orientation Level: Disoriented to;Time;Situation   Memory: Decreased short-term memory Following Commands: Follows one step commands with increased time Safety/Judgement: Decreased awareness of safety   Problem Solving: Slow processing;Decreased initiation;Difficulty sequencing;Requires verbal cues General Comments: Patient unable to provide correct PLOF and home information, contradicting himself.  Slow to process information, requiring repeated cues at times.      General Comments      Exercises     Assessment/Plan    PT Assessment Patient needs continued PT services  PT Problem List Decreased strength;Decreased activity tolerance;Decreased balance;Decreased mobility;Decreased coordination;Decreased cognition;Decreased knowledge of use of DME;Decreased safety awareness       PT Treatment Interventions DME instruction;Gait training;Functional mobility training;Therapeutic activities;Therapeutic exercise;Cognitive remediation;Patient/family education    PT Goals (Current goals can be found in the Care Plan section)  Acute Rehab PT Goals Patient Stated Goal: none stated PT Goal Formulation: With patient Time For Goal Achievement: 01/12/17 Potential to Achieve Goals: Good    Frequency Min 3X/week   Barriers to discharge Inaccessible home environment;Decreased caregiver support Stairs to enter home.  Unsure that wife can provide assist required by patient at this time.    Co-evaluation               AM-PAC PT "6 Clicks" Daily Activity  Outcome Measure Difficulty turning  over in bed (including adjusting bedclothes, sheets and blankets)?: None Difficulty moving from lying on back to sitting on the side of the bed? : Unable Difficulty sitting down on and standing up from a chair with arms (e.g., wheelchair, bedside commode, etc,.)?: Unable Help needed moving to and from a bed to chair (including a wheelchair)?: A Little Help needed walking in hospital room?: A Lot Help needed climbing 3-5 steps with a railing? : A Lot 6 Click Score: 13    End of Session Equipment Utilized During Treatment: Gait belt Activity Tolerance: Patient limited by fatigue Patient left: in bed;with call bell/phone within reach;with bed alarm set Nurse Communication: Mobility status (Patient had BM) PT Visit Diagnosis: Unsteadiness on feet (R26.81);Other abnormalities of gait and mobility (R26.89);Muscle weakness (generalized) (M62.81);History of falling (Z91.81)    Time: 1610-9604 PT Time Calculation (min) (ACUTE ONLY): 34 min   Charges:   PT Evaluation $PT Eval Moderate Complexity: 1 Mod PT Treatments $Gait Training: 8-22 mins   PT G Codes:        Durenda Hurt. Renaldo Fiddler, [facility] Acute Rehab Services Pager [phone]   Vena Austria 01/05/2017, 8:24 PM

## 2017-01-05 NOTE — Progress Notes (Signed)
PROGRESS NOTE    [name]  ZOX:[id number] DOB: Oct 13, 1935 DOA: 01/01/2017 PCP: Crist Fat, MD    Brief Narrative:  81 year old male who presents with generalized weakness, altered mental status, worsening pain on the right lower extremity. Patient is known to have end-stage renal disease on hemodialysis, diabetes mellitus type 2, arthritis, hypertension, coronary disease and peripheral vascular disease. He was recently diagnosed with right lower extremity wound infection, he was placed on IV vancomycin during dialysis. He developed worsening symptoms despite IV antibiotics, increased pain and purulent discharge, associated with ambulatory dysfunction. On his initial physical examination blood pressure 125/59, heart rate 78, temperature 97.9, respiratory 14, oxygen saturation 91%. Dry mucous membranes, his lungs were clear to auscultation bilaterally, heart S1-S2 present and rhythmic, no gallops or murmurs, the abdomen was soft nontender, lower extremities with 2+ edema more right than left. Sodium 134, potassium 5.4, chloride 93, bicarbonate 27, glucose 107, BUN 44, creatinine 13.9, calcium 9.3, white count 8.3, hemoglobin 11.0, hematocrit 33.8, platelets 208. Chest x-ray with low lung volumes, right lower lobe atelectasis. EKG sinus rhythm, normal intervals.  Patient was admitted with the working diagnosis of sepsis due to right lower extremity cellulitis.  Significant peripheral vascular disease, per angiography. Will continue medical care and plan to follow up with vascular surgery as outpatient, patient very deconditioned, may need SNF, pending physical therapy evaluation.    Assessment & Plan:   Principal Problem:   Ulcer of right foot due to type 2 diabetes mellitus (HCC) Active Problems:   Sepsis due to cellulitis (HCC)   Hypertension   Coronary artery disease   CKD (chronic kidney disease) stage V requiring chronic dialysis (HCC)   Peripheral vascular disease of lower  extremity with ulceration/RLE cellulitis (HCC)   Acute metabolic encephalopathy   Diabetes mellitus   Diabetes mellitus with complication (HCC)   1. Right lower extremity cellulitis, complicated by sepsis, present on admission. Antibiotic therapy with IV vancomycin and IV Zosyn #4. Per vascular surgery, patient will need complex surgical intervention, recommendation follow up as outpatient in 2 weeks, continue antibiotic therapy. Patient very deconditioned, may need SNF at discharge, will consult physical therapy for evaluation.   2. Metabolic encephalopathy. Confused and disorientation, suspected patient's baseline mentation, severe deconditioning, patient not able to stand with no assistance, will follow with physical therapy recommendations.    3. End-stage renal disease on hemodialysis. Had HD today with no major complications, clinically normovolemic, continue Aranesp, velphoro.    4. Hypertension. Not on antihypertensive agents at home, blood pressure has been stable at 97 to 100. Patient off telemetry.   5. Type 2 diabetes mellitus. Capillary glucose 1204, 132, 97, Continue insulin sliding scale for glucose cover and monitoring, tolerating po well.    6. Peripheral vascular disease, coronary artery disease.  Antiplatelet therapy with asa. Will need complex vascular intervention, plan for outpatient follow up.     DVT prophylaxis:heparin  Code Status:Full  Family Communication: Disposition Plan:   Consultants:    Procedures:   Antimicrobials:  Vancomycin  Zosyn  Subjective: Patient complained of right foot pain, worse to touch, no radiation, no improving factors, no associated nausea or vomiting.   Objective: Vitals:   01/05/17 1000 01/05/17 1030 01/05/17 1100 01/05/17 1130  BP: (!) 97/42 (!) 107/41 (!) 107/40 (!) 97/46  Pulse: (!) 51 72 74 75  Resp:      Temp:      TempSrc:      SpO2:      Weight:  Height:        Intake/Output  Summary (Last 24 hours) at 01/05/17 1245 Last data filed at 01/04/17 2119  Gross per 24 hour  Intake              240 ml  Output                0 ml  Net              240 ml   Filed Weights   01/04/17 0517 01/05/17 0440 01/05/17 0755  Weight: 100.6 kg (221 lb 12.5 oz) 101.8 kg (224 lb 8 oz) 90 kg (198 lb 6.6 oz)    Examination:  General exam: deconditioned E ENT. Mild pallor, no icterus, oral mucosa moist.  Respiratory system: Clear to auscultation. Respiratory effort normal. No wheezing, rales or rhonchi.  Cardiovascular system: S1 & S2 heard, RRR. No JVD, murmurs, rubs, gallops or clicks. No pedal edema. Gastrointestinal system: Abdomen is nondistended, soft and nontender. No organomegaly or masses felt. Normal bowel sounds heard. Central nervous system: Alert and oriented. No focal neurological deficits. Extremities: Symmetric 5 x 5 power. Skin: No rashes, lesions or ulcers     Data Reviewed: I have personally reviewed following labs and imaging studies  CBC:  Recent Labs Lab 01/01/17 1241 01/01/17 2050 01/02/17 0847 01/03/17 0337 01/04/17 0228 01/05/17 0345  WBC 8.3 7.6 7.3 6.7 8.0 11.2*  NEUTROABS 6.3  --   --   --   --  9.1*  HGB 11.0* 13.3 11.1* 10.3* 11.2* 11.4*  HCT 33.8* 40.7 34.1* 31.4* 33.3* 34.1*  MCV 97.4 97.6 97.4 97.8 96.0 95.8  PLT 208 207 215 203 242 259   Basic Metabolic Panel:  Recent Labs Lab 12/29/16 1400 01/01/17 1241 01/01/17 2050 01/02/17 0847 01/03/17 0337 01/04/17 0228 01/05/17 0345  NA 135 134* 133* 136 136 136 135  K 4.6 5.4* 5.2* 5.1 5.1 5.1 4.7  CL 94* 93* 92* 96* 97* 95* 93*  CO2 27 27 22 26 25 25 25   GLUCOSE 102* 107* 97 91 83 138* 125*  BUN 34* 44* 21* 28* 38* 29* 54*  CREATININE 12.18* 13.98* 8.80* 10.46* 12.08* 8.74* 11.32*  CALCIUM 9.3 9.3 9.7 9.7 9.5 9.2 9.7  MG  --  2.9* 2.5*  --   --   --   --   PHOS 6.2*  --  5.2*  5.2*  --   --   --   --    GFR: Estimated Creatinine Clearance: 6 mL/min (A) (by C-G formula  based on SCr of 11.32 mg/dL (H)). Liver Function Tests:  Recent Labs Lab 12/29/16 1400 01/01/17 2050 01/02/17 0847  AST  --   --  145*  ALT  --   --  58  ALKPHOS  --   --  130*  BILITOT  --   --  0.6  PROT  --   --  6.4*  ALBUMIN 3.0* 3.6 2.9*   No results for input(s): LIPASE, AMYLASE in the last 168 hours. No results for input(s): AMMONIA in the last 168 hours. Coagulation Profile:  Recent Labs Lab 01/01/17 1429  INR 1.10   Cardiac Enzymes: No results for input(s): CKTOTAL, CKMB, CKMBINDEX, TROPONINI in the last 168 hours. BNP (last 3 results) No results for input(s): PROBNP in the last 8760 hours. HbA1C: No results for input(s): HGBA1C in the last 72 hours. CBG:  Recent Labs Lab 01/03/17 2053 01/04/17 1610 01/04/17 1140 01/04/17 1640 01/04/17 2011  GLUCAP 128* 135* 145* 204* 132*   Lipid Profile: No results for input(s): CHOL, HDL, LDLCALC, TRIG, CHOLHDL, LDLDIRECT in the last 72 hours. Thyroid Function Tests: No results for input(s): TSH, T4TOTAL, FREET4, T3FREE, THYROIDAB in the last 72 hours. Anemia Panel: No results for input(s): VITAMINB12, FOLATE, FERRITIN, TIBC, IRON, RETICCTPCT in the last 72 hours. Sepsis Labs:  Recent Labs Lab 01/01/17 1429 01/01/17 1433 01/01/17 1910 01/01/17 2301 01/02/17 0847  PROCALCITON 1.12  --   --   --   --   LATICACIDVEN  --  0.9 2.3* 2.6* 1.2    Recent Results (from the past 240 hour(s))  Blood Cultures x 2 sites     Status: None   Collection Time: 12/26/16  2:14 PM  Result Value Ref Range Status   Specimen Description BLOOD RIGHT ANTECUBITAL  Final   Special Requests   Final    BOTTLES DRAWN AEROBIC AND ANAEROBIC Blood Culture adequate volume   Culture NO GROWTH 5 DAYS  Final   Report Status 12/31/2016 FINAL  Final  MRSA PCR Screening     Status: Abnormal   Collection Time: 12/26/16  5:14 PM  Result Value Ref Range Status   MRSA by PCR INVALID RESULTS, SPECIMEN SENT FOR CULTURE (A) NEGATIVE Final     Comment: RESULT CALLED TO, READ BACK BY AND VERIFIED WITH: E.CASTRO, RN 12/27/16 0057 L.CHAMPION   MRSA culture     Status: None   Collection Time: 12/26/16  5:14 PM  Result Value Ref Range Status   Specimen Description NASAL SWAB  Final   Special Requests NONE  Final   Culture NO MRSA DETECTED  Final   Report Status 12/28/2016 FINAL  Final  Culture, blood (x 2)     Status: None (Preliminary result)   Collection Time: 01/01/17  2:10 PM  Result Value Ref Range Status   Specimen Description BLOOD AVG  Final   Special Requests   Final    BOTTLES DRAWN AEROBIC AND ANAEROBIC Blood Culture results may not be optimal due to an excessive volume of blood received in culture bottles   Culture NO GROWTH 3 DAYS  Final   Report Status PENDING  Incomplete  Culture, blood (x 2)     Status: None (Preliminary result)   Collection Time: 01/01/17  2:25 PM  Result Value Ref Range Status   Specimen Description BLOOD AVG  Final   Special Requests   Final    BOTTLES DRAWN AEROBIC AND ANAEROBIC Blood Culture results may not be optimal due to an excessive volume of blood received in culture bottles   Culture NO GROWTH 3 DAYS  Final   Report Status PENDING  Incomplete         Radiology Studies: No results found.      Scheduled Meds: . allopurinol  100 mg Oral QPM  . aspirin EC  81 mg Oral QPM  . cyanocobalamin  250 mcg Oral QPM  . darbepoetin (ARANESP) injection - DIALYSIS  100 mcg Intravenous Q Thu-HD  . heparin  5,000 Units Subcutaneous Q8H  . insulin aspart  0-9 Units Subcutaneous TID WC  . multivitamin  1 tablet Oral QHS  . sodium chloride flush  3 mL Intravenous Q12H  . sodium chloride flush  3 mL Intravenous Q12H  . sucroferric oxyhydroxide  1,000 mg Oral TID WC   Continuous Infusions: . sodium chloride 250 mL (01/03/17 1851)  . piperacillin-tazobactam    . piperacillin-tazobactam (ZOSYN)  IV 3.375 g (01/05/17 0542)  .  vancomycin 1,000 mg (01/05/17 1057)     LOS: 4 days       Jeferson Boozer Annett Gula, MD [facility] Pager [phone]  If 7PM-7AM, please contact night-coverage [email or web address] Password [facility] 01/05/2017, 12:45 PM

## 2017-01-05 NOTE — Procedures (Signed)
Pt seen on HD.  Ap 100 Vp 200 BFR 350.  Not convinced he can be managed at home and suspect NH may be best option.  Tolerating HD well.

## 2017-01-06 DIAGNOSIS — I70261 Atherosclerosis of native arteries of extremities with gangrene, right leg: Secondary | ICD-10-CM

## 2017-01-06 DIAGNOSIS — E1121 Type 2 diabetes mellitus with diabetic nephropathy: Secondary | ICD-10-CM

## 2017-01-06 LAB — CBC WITH DIFFERENTIAL/PLATELET
BASOS ABS: 0 10*3/uL (ref 0.0–0.1)
Basophils Relative: 1 %
EOS PCT: 6 %
Eosinophils Absolute: 0.5 10*3/uL (ref 0.0–0.7)
HEMATOCRIT: 34.4 % — AB (ref 39.0–52.0)
Hemoglobin: 11.6 g/dL — ABNORMAL LOW (ref 13.0–17.0)
LYMPHS PCT: 18 %
Lymphs Abs: 1.5 10*3/uL (ref 0.7–4.0)
MCH: 31.6 pg (ref 26.0–34.0)
MCHC: 33.7 g/dL (ref 30.0–36.0)
MCV: 93.7 fL (ref 78.0–100.0)
MONO ABS: 0.6 10*3/uL (ref 0.1–1.0)
MONOS PCT: 7 %
Neutro Abs: 5.9 10*3/uL (ref 1.7–7.7)
Neutrophils Relative %: 68 %
PLATELETS: 260 10*3/uL (ref 150–400)
RBC: 3.67 MIL/uL — ABNORMAL LOW (ref 4.22–5.81)
RDW: 14.3 % (ref 11.5–15.5)
WBC: 8.6 10*3/uL (ref 4.0–10.5)

## 2017-01-06 LAB — GLUCOSE, CAPILLARY
GLUCOSE-CAPILLARY: 82 mg/dL (ref 65–99)
GLUCOSE-CAPILLARY: 110 mg/dL — AB (ref 65–99)
Glucose-Capillary: 95 mg/dL (ref 65–99)

## 2017-01-06 LAB — BASIC METABOLIC PANEL
Anion gap: 13 (ref 5–15)
BUN: 39 mg/dL — AB (ref 6–20)
CALCIUM: 9.1 mg/dL (ref 8.9–10.3)
CHLORIDE: 94 mmol/L — AB (ref 101–111)
CO2: 25 mmol/L (ref 22–32)
CREATININE: 9.19 mg/dL — AB (ref 0.61–1.24)
GFR calc non Af Amer: 5 mL/min — ABNORMAL LOW (ref >60)
GFR, EST AFRICAN AMERICAN: 6 mL/min — AB (ref >60)
GLUCOSE: 86 mg/dL (ref 65–99)
Potassium: 3.9 mmol/L (ref 3.5–5.1)
Sodium: 132 mmol/L — ABNORMAL LOW (ref 135–145)

## 2017-01-06 LAB — CULTURE, BLOOD (ROUTINE X 2)
Culture: NO GROWTH
Culture: NO GROWTH

## 2017-01-06 LAB — MAGNESIUM: Magnesium: 2.3 mg/dL (ref 1.7–2.4)

## 2017-01-06 NOTE — Progress Notes (Signed)
PROGRESS NOTE    [name]  ZOX:[id number] DOB: [birth year] DOA: 01/01/2017 PCP: Ian Fat, MD     Brief Narrative:  81 year old BM PMHx ESRD on HD T/Th/Sat, CHF, CAD, PVD, MI, Diabetes mellitus type II  Presents with generalized weakness, altered mental status, worsening pain on the right lower extremity. He was recently diagnosed with right lower extremity wound infection, he was placed on IV vancomycin during dialysis. He developed worsening symptoms despite IV antibiotics, increased pain and purulent discharge, associated with ambulatory dysfunction. On his initial physical examination blood pressure 125/59, heart rate 78, temperature 97.9, respiratory 14, oxygen saturation 91%. Dry mucous membranes, his lungs were clear to auscultation bilaterally, heart S1-S2 present and rhythmic, no gallops or murmurs, the abdomen was soft nontender, lower extremities with 2+ edema more right than left. Sodium 134, potassium 5.4, chloride 93, bicarbonate 27, glucose 107, BUN 44, creatinine 13.9, calcium 9.3, white count 8.3, hemoglobin 11.0, hematocrit 33.8, platelets 208. Chest x-ray with low lung volumes, right lower lobe atelectasis. EKG sinus rhythm, normal intervals.  Patient was admitted with the working diagnosis of sepsis due to right lower extremity cellulitis. Significant peripheral vascular disease, per angiography. Will continue medical care and plan to follow up with vascular surgery as outpatient, patient very deconditioned, may need SNF, pending physical therapy evaluation.    Subjective: 8/19  A/O 4, negative CP, negative SOB, negative N/V, negative abdominal pain, negative lower extremity pain.   Assessment & Plan:   Principal Problem:   Ulcer of right foot due to type 2 diabetes mellitus (HCC) Active Problems:   Sepsis due to cellulitis (HCC)   Hypertension   Coronary artery disease   CKD (chronic kidney disease) stage V requiring chronic dialysis (HCC)   Peripheral  vascular disease of lower extremity with ulceration/RLE cellulitis (HCC)   Acute metabolic encephalopathy   Diabetes mellitus   Diabetes mellitus with complication (HCC)    Sepsis unspecified organism/RLL Gangrene/Cellulitis -Metatarsals 3 - 5 with cellulitis/gangrene. Continue antibiotics until patient follows up with [facility]  -On 8/20 consult ID/[facility] length of treatment with antibiotics. 2 weeks? -Obtain outpatient follow-up appointment. -Discharge to SNF per PT recommendation -On 8/20 consult LCSW begin process of SNF placement  Metabolic encephalopathy.  -Resolved    ESRD on HD T/Th/Sat -HD per nephrology -. continue Aranesp, velphoro.     Essential HTN -Stable -Continue PRN meds    Diabetes Type 2  controlled with renal complication -Sensitive SSI     PVD/CAD  -Aspirin       DVT prophylaxis: Subcutaneous heparin Code Status: Full Family Communication: sister  Disposition Plan: Discharge SNF 24-48 hour   Consultants:  Nephrology Vascular surgery    Procedures/Significant Events:  8/16 -left common femoral artery cannulation under ultrasound guidance, Aortogram,,Bilateral runoff via catheter    I have personally reviewed and interpreted all radiology studies and my findings are as above.  VENTILATOR SETTINGS:    Cultures 8/14 blood negative final  Antimicrobials: Anti-infectives    Start     Stop   01/05/17 0936  vancomycin (VANCOCIN) 1-5 GM/200ML-% IVPB    Comments:  [name]   : cabinet override   01/05/17 1058   01/03/17 1200  vancomycin (VANCOCIN) IVPB 1000 mg/200 mL premix         01/03/17 1034  vancomycin (VANCOCIN) 1-5 GM/200ML-% IVPB    Comments:  [name]   : cabinet override   01/03/17 1040   01/02/17 0600  piperacillin-tazobactam (ZOSYN) IVPB 3.375 g  01/01/17 1345  piperacillin-tazobactam (ZOSYN) IVPB 3.375 g         01/01/17 1345  vancomycin (VANCOCIN) IVPB 1000 mg/200 mL premix     01/01/17 1838        Devices None   LINES / TUBES:      Continuous Infusions: . sodium chloride 250 mL (01/03/17 1851)  . piperacillin-tazobactam    . piperacillin-tazobactam (ZOSYN)  IV 3.375 g (01/06/17 0534)  . vancomycin Stopped (01/05/17 1157)     Objective: Vitals:   01/05/17 1314 01/05/17 2016 01/06/17 0002 01/06/17 0400  BP: (!) 90/46 (!) 105/52 (!) 113/47 (!) 122/54  Pulse: 74 75 73 73  Resp: 13 15 15 16   Temp: 98.1 F (36.7 C) 97.9 F (36.6 C) 98.5 F (36.9 C) 98.7 F (37.1 C)  TempSrc: Oral Axillary Oral Oral  SpO2: 92% 99% 97% 95%  Weight:    192 lb 14.4 oz (87.5 kg)  Height:        Intake/Output Summary (Last 24 hours) at 01/06/17 0905 Last data filed at 01/05/17 1210  Gross per 24 hour  Intake                0 ml  Output             2500 ml  Net            -2500 ml   Filed Weights   01/05/17 0755 01/05/17 1210 01/06/17 0400  Weight: 198 lb 6.6 oz (90 kg) 192 lb 14.4 oz (87.5 kg) 192 lb 14.4 oz (87.5 kg)    Examination:  General: A/O 4, No acute respiratory distress Eyes: negative scleral hemorrhage, negative anisocoria, negative icterus Lungs: Clear to auscultation bilaterally without wheezes or crackles Cardiovascular: Regular rate and rhythm without murmur gallop or rub normal S1 and S2 Abdomen: negative abdominal pain, nondistended, positive soft, bowel sounds, no rebound, no ascites, no appreciable mass Extremities: RLE: Metatarsals 3-5 red to black, decreased/loss of feeling, painful to palpation second metatarsal. Skin: Negative rashes, lesions, ulcers Psychiatric:  Negative depression, negative anxiety, negative fatigue, negative mania  Central nervous system:  Cranial nerves II through XII intact, tongue/uvula midline, all extremities muscle strength 5/5,  negative dysarthria, negative expressive aphasia, negative receptive aphasia.  .     Data Reviewed: Care during the described time interval was provided by me .  I have reviewed this patient's  available data, including medical history, events of note, physical examination, and all test results as part of my evaluation.  CBC:  Recent Labs Lab 01/01/17 1241  01/02/17 0847 01/03/17 0337 01/04/17 0228 01/05/17 0345 01/06/17 0210  WBC 8.3  < > 7.3 6.7 8.0 11.2* 8.6  NEUTROABS 6.3  --   --   --   --  9.1* 5.9  HGB 11.0*  < > 11.1* 10.3* 11.2* 11.4* 11.6*  HCT 33.8*  < > 34.1* 31.4* 33.3* 34.1* 34.4*  MCV 97.4  < > 97.4 97.8 96.0 95.8 93.7  PLT 208  < > 215 203 242 259 260  < > = values in this interval not displayed. Basic Metabolic Panel:  Recent Labs Lab 01/01/17 1241 01/01/17 2050 01/02/17 0847 01/03/17 0337 01/04/17 0228 01/05/17 0345  NA 134* 133* 136 136 136 135  K 5.4* 5.2* 5.1 5.1 5.1 4.7  CL 93* 92* 96* 97* 95* 93*  CO2 27 22 26 25 25 25   GLUCOSE 107* 97 91 83 138* 125*  BUN 44* 21* 28* 38* 29* 54*  CREATININE 13.98* 8.80* 10.46* 12.08* 8.74* 11.32*  CALCIUM 9.3 9.7 9.7 9.5 9.2 9.7  MG 2.9* 2.5*  --   --   --   --   PHOS  --  5.2*  5.2*  --   --   --   --    GFR: Estimated Creatinine Clearance: 5.5 mL/min (A) (by C-G formula based on SCr of 11.32 mg/dL (H)). Liver Function Tests:  Recent Labs Lab 01/01/17 2050 01/02/17 0847  AST  --  145*  ALT  --  58  ALKPHOS  --  130*  BILITOT  --  0.6  PROT  --  6.4*  ALBUMIN 3.6 2.9*   No results for input(s): LIPASE, AMYLASE in the last 168 hours. No results for input(s): AMMONIA in the last 168 hours. Coagulation Profile:  Recent Labs Lab 01/01/17 1429  INR 1.10   Cardiac Enzymes: No results for input(s): CKTOTAL, CKMB, CKMBINDEX, TROPONINI in the last 168 hours. BNP (last 3 results) No results for input(s): PROBNP in the last 8760 hours. HbA1C: No results for input(s): HGBA1C in the last 72 hours. CBG:  Recent Labs Lab 01/04/17 2011 01/05/17 1311 01/05/17 1624 01/05/17 2108 01/06/17 0612  GLUCAP 132* 97 79 115* 95   Lipid Profile: No results for input(s): CHOL, HDL, LDLCALC, TRIG,  CHOLHDL, LDLDIRECT in the last 72 hours. Thyroid Function Tests: No results for input(s): TSH, T4TOTAL, FREET4, T3FREE, THYROIDAB in the last 72 hours. Anemia Panel: No results for input(s): VITAMINB12, FOLATE, FERRITIN, TIBC, IRON, RETICCTPCT in the last 72 hours. Sepsis Labs:  Recent Labs Lab 01/01/17 1429 01/01/17 1433 01/01/17 1910 01/01/17 2301 01/02/17 0847  PROCALCITON 1.12  --   --   --   --   LATICACIDVEN  --  0.9 2.3* 2.6* 1.2    Recent Results (from the past 240 hour(s))  Culture, blood (x 2)     Status: None (Preliminary result)   Collection Time: 01/01/17  2:10 PM  Result Value Ref Range Status   Specimen Description BLOOD AVG  Final   Special Requests   Final    BOTTLES DRAWN AEROBIC AND ANAEROBIC Blood Culture results may not be optimal due to an excessive volume of blood received in culture bottles   Culture NO GROWTH 4 DAYS  Final   Report Status PENDING  Incomplete  Culture, blood (x 2)     Status: None (Preliminary result)   Collection Time: 01/01/17  2:25 PM  Result Value Ref Range Status   Specimen Description BLOOD AVG  Final   Special Requests   Final    BOTTLES DRAWN AEROBIC AND ANAEROBIC Blood Culture results may not be optimal due to an excessive volume of blood received in culture bottles   Culture NO GROWTH 4 DAYS  Final   Report Status PENDING  Incomplete         Radiology Studies: No results found.      Scheduled Meds: . allopurinol  100 mg Oral QPM  . aspirin EC  81 mg Oral QPM  . cyanocobalamin  250 mcg Oral QPM  . darbepoetin (ARANESP) injection - DIALYSIS  100 mcg Intravenous Q Thu-HD  . heparin  5,000 Units Subcutaneous Q8H  . insulin aspart  0-9 Units Subcutaneous TID WC  . multivitamin  1 tablet Oral QHS  . sodium chloride flush  3 mL Intravenous Q12H  . sodium chloride flush  3 mL Intravenous Q12H  . sucroferric oxyhydroxide  1,000 mg Oral TID WC  Continuous Infusions: . sodium chloride 250 mL (01/03/17 1851)  .  piperacillin-tazobactam    . piperacillin-tazobactam (ZOSYN)  IV 3.375 g (01/06/17 0534)  . vancomycin Stopped (01/05/17 1157)     LOS: 5 days    Time spent:40 min    Nygeria Lager, Roselind Messier, MD [facility] Pager [phone]  If 7PM-7AM, please contact night-coverage [email or web address] Password [email or web address] 01/06/2017, 9:05 AM

## 2017-01-06 NOTE — Progress Notes (Signed)
S: Eating well No new CO. O:BP (!) 122/54 (BP Location: Right Arm)   Pulse 73   Temp 98.7 F (37.1 C) (Oral)   Resp 16   Ht 5\' 10"  (1.778 m)   Wt 87.5 kg (192 lb 14.4 oz)   SpO2 95%   BMI 27.68 kg/m   Intake/Output Summary (Last 24 hours) at 01/06/17 0748 Last data filed at 01/05/17 1210  Gross per 24 hour  Intake                0 ml  Output             2500 ml  Net            -2500 ml   Weight change: -11.833 kg (-26 lb 1.4 oz) Gen: Awake and alert CVS: RRR Resp:Clear Abd:+ BS NTND Ext: Erythema and drainage Rt foot improved NEURO: CNI, Ox2   . allopurinol  100 mg Oral QPM  . aspirin EC  81 mg Oral QPM  . cyanocobalamin  250 mcg Oral QPM  . darbepoetin (ARANESP) injection - DIALYSIS  100 mcg Intravenous Q Thu-HD  . heparin  5,000 Units Subcutaneous Q8H  . insulin aspart  0-9 Units Subcutaneous TID WC  . multivitamin  1 tablet Oral QHS  . sodium chloride flush  3 mL Intravenous Q12H  . sodium chloride flush  3 mL Intravenous Q12H  . sucroferric oxyhydroxide  1,000 mg Oral TID WC   No results found. BMET    Component Value Date/Time   NA 135 01/05/2017 0345   K 4.7 01/05/2017 0345   CL 93 (L) 01/05/2017 0345   CO2 25 01/05/2017 0345   GLUCOSE 125 (H) 01/05/2017 0345   BUN 54 (H) 01/05/2017 0345   CREATININE 11.32 (H) 01/05/2017 0345   CALCIUM 9.7 01/05/2017 0345   CALCIUM 8.4 11/29/2007 1515   GFRNONAA 4 (L) 01/05/2017 0345   GFRAA 4 (L) 01/05/2017 0345   CBC    Component Value Date/Time   WBC 8.6 01/06/2017 0210   RBC 3.67 (L) 01/06/2017 0210   HGB 11.6 (L) 01/06/2017 0210   HCT 34.4 (L) 01/06/2017 0210   PLT 260 01/06/2017 0210   MCV 93.7 01/06/2017 0210   MCH 31.6 01/06/2017 0210   MCHC 33.7 01/06/2017 0210   RDW 14.3 01/06/2017 0210   LYMPHSABS 1.5 01/06/2017 0210   MONOABS 0.6 01/06/2017 0210   EOSABS 0.5 01/06/2017 0210   BASOSABS 0.0 01/06/2017 0210     Assessment:  1. Ischemic Rt foot with cellulitis, cellulitis improving 2.  Anemia 3. Sec HPTH on parsabiv at outpt but not available inpt 4. DM  Not on insulin 5. HTN 6. ESRD  TTS [facility]   Plan: 1. HD tues 2. I doubt his wife will be able to handle him as outpt and thus he will most likely need a SNF  Lucy Boardman T

## 2017-01-07 ENCOUNTER — Telehealth: Payer: Self-pay | Admitting: Vascular Surgery

## 2017-01-07 DIAGNOSIS — R6520 Severe sepsis without septic shock: Secondary | ICD-10-CM | POA: Diagnosis not present

## 2017-01-07 DIAGNOSIS — A419 Sepsis, unspecified organism: Secondary | ICD-10-CM | POA: Diagnosis not present

## 2017-01-07 DIAGNOSIS — M6281 Muscle weakness (generalized): Secondary | ICD-10-CM | POA: Diagnosis not present

## 2017-01-07 DIAGNOSIS — S91301A Unspecified open wound, right foot, initial encounter: Secondary | ICD-10-CM | POA: Diagnosis not present

## 2017-01-07 DIAGNOSIS — I2510 Atherosclerotic heart disease of native coronary artery without angina pectoris: Secondary | ICD-10-CM | POA: Diagnosis not present

## 2017-01-07 DIAGNOSIS — L8992 Pressure ulcer of unspecified site, stage 2: Secondary | ICD-10-CM | POA: Diagnosis not present

## 2017-01-07 DIAGNOSIS — M25571 Pain in right ankle and joints of right foot: Secondary | ICD-10-CM | POA: Diagnosis not present

## 2017-01-07 DIAGNOSIS — R52 Pain, unspecified: Secondary | ICD-10-CM | POA: Diagnosis not present

## 2017-01-07 DIAGNOSIS — I7389 Other specified peripheral vascular diseases: Secondary | ICD-10-CM | POA: Diagnosis not present

## 2017-01-07 DIAGNOSIS — I252 Old myocardial infarction: Secondary | ICD-10-CM | POA: Diagnosis not present

## 2017-01-07 DIAGNOSIS — B999 Unspecified infectious disease: Secondary | ICD-10-CM | POA: Diagnosis not present

## 2017-01-07 DIAGNOSIS — G9341 Metabolic encephalopathy: Secondary | ICD-10-CM | POA: Diagnosis not present

## 2017-01-07 DIAGNOSIS — N186 End stage renal disease: Secondary | ICD-10-CM | POA: Diagnosis not present

## 2017-01-07 DIAGNOSIS — I10 Essential (primary) hypertension: Secondary | ICD-10-CM | POA: Diagnosis not present

## 2017-01-07 DIAGNOSIS — N179 Acute kidney failure, unspecified: Secondary | ICD-10-CM | POA: Diagnosis not present

## 2017-01-07 DIAGNOSIS — I739 Peripheral vascular disease, unspecified: Secondary | ICD-10-CM | POA: Diagnosis not present

## 2017-01-07 DIAGNOSIS — M79671 Pain in right foot: Secondary | ICD-10-CM | POA: Diagnosis not present

## 2017-01-07 DIAGNOSIS — Z7982 Long term (current) use of aspirin: Secondary | ICD-10-CM | POA: Diagnosis not present

## 2017-01-07 DIAGNOSIS — E118 Type 2 diabetes mellitus with unspecified complications: Secondary | ICD-10-CM | POA: Diagnosis not present

## 2017-01-07 DIAGNOSIS — R6883 Chills (without fever): Secondary | ICD-10-CM | POA: Diagnosis not present

## 2017-01-07 DIAGNOSIS — R488 Other symbolic dysfunctions: Secondary | ICD-10-CM | POA: Diagnosis not present

## 2017-01-07 DIAGNOSIS — R55 Syncope and collapse: Secondary | ICD-10-CM | POA: Diagnosis not present

## 2017-01-07 DIAGNOSIS — L03115 Cellulitis of right lower limb: Secondary | ICD-10-CM | POA: Diagnosis not present

## 2017-01-07 DIAGNOSIS — I998 Other disorder of circulatory system: Secondary | ICD-10-CM | POA: Diagnosis not present

## 2017-01-07 DIAGNOSIS — M150 Primary generalized (osteo)arthritis: Secondary | ICD-10-CM | POA: Diagnosis not present

## 2017-01-07 DIAGNOSIS — E11621 Type 2 diabetes mellitus with foot ulcer: Secondary | ICD-10-CM | POA: Diagnosis not present

## 2017-01-07 DIAGNOSIS — I96 Gangrene, not elsewhere classified: Secondary | ICD-10-CM | POA: Diagnosis not present

## 2017-01-07 DIAGNOSIS — I2584 Coronary atherosclerosis due to calcified coronary lesion: Secondary | ICD-10-CM | POA: Diagnosis not present

## 2017-01-07 DIAGNOSIS — R1310 Dysphagia, unspecified: Secondary | ICD-10-CM | POA: Diagnosis not present

## 2017-01-07 DIAGNOSIS — Z992 Dependence on renal dialysis: Secondary | ICD-10-CM | POA: Diagnosis not present

## 2017-01-07 DIAGNOSIS — S91302A Unspecified open wound, left foot, initial encounter: Secondary | ICD-10-CM | POA: Diagnosis not present

## 2017-01-07 DIAGNOSIS — I509 Heart failure, unspecified: Secondary | ICD-10-CM | POA: Diagnosis not present

## 2017-01-07 DIAGNOSIS — N2581 Secondary hyperparathyroidism of renal origin: Secondary | ICD-10-CM | POA: Diagnosis not present

## 2017-01-07 DIAGNOSIS — E1129 Type 2 diabetes mellitus with other diabetic kidney complication: Secondary | ICD-10-CM | POA: Diagnosis not present

## 2017-01-07 DIAGNOSIS — R262 Difficulty in walking, not elsewhere classified: Secondary | ICD-10-CM | POA: Diagnosis not present

## 2017-01-07 DIAGNOSIS — E119 Type 2 diabetes mellitus without complications: Secondary | ICD-10-CM | POA: Diagnosis not present

## 2017-01-07 DIAGNOSIS — D689 Coagulation defect, unspecified: Secondary | ICD-10-CM | POA: Diagnosis not present

## 2017-01-07 DIAGNOSIS — I70269 Atherosclerosis of native arteries of extremities with gangrene, unspecified extremity: Secondary | ICD-10-CM | POA: Diagnosis not present

## 2017-01-07 DIAGNOSIS — I120 Hypertensive chronic kidney disease with stage 5 chronic kidney disease or end stage renal disease: Secondary | ICD-10-CM | POA: Diagnosis not present

## 2017-01-07 DIAGNOSIS — D631 Anemia in chronic kidney disease: Secondary | ICD-10-CM | POA: Diagnosis not present

## 2017-01-07 DIAGNOSIS — E875 Hyperkalemia: Secondary | ICD-10-CM | POA: Diagnosis not present

## 2017-01-07 DIAGNOSIS — M1A9XX0 Chronic gout, unspecified, without tophus (tophi): Secondary | ICD-10-CM | POA: Diagnosis not present

## 2017-01-07 DIAGNOSIS — L0390 Cellulitis, unspecified: Secondary | ICD-10-CM | POA: Diagnosis not present

## 2017-01-07 DIAGNOSIS — I132 Hypertensive heart and chronic kidney disease with heart failure and with stage 5 chronic kidney disease, or end stage renal disease: Secondary | ICD-10-CM | POA: Diagnosis not present

## 2017-01-07 LAB — GLUCOSE, CAPILLARY
GLUCOSE-CAPILLARY: 204 mg/dL — AB (ref 65–99)
GLUCOSE-CAPILLARY: 100 mg/dL — AB (ref 65–99)
Glucose-Capillary: 152 mg/dL — ABNORMAL HIGH (ref 65–99)
Glucose-Capillary: 132 mg/dL — ABNORMAL HIGH (ref 65–99)

## 2017-01-07 LAB — RENAL FUNCTION PANEL
Albumin: 2.8 g/dL — ABNORMAL LOW (ref 3.5–5.0)
Anion gap: 15 (ref 5–15)
BUN: 49 mg/dL — ABNORMAL HIGH (ref 6–20)
CHLORIDE: 92 mmol/L — AB (ref 101–111)
CO2: 26 mmol/L (ref 22–32)
Calcium: 8.8 mg/dL — ABNORMAL LOW (ref 8.9–10.3)
Creatinine, Ser: 10.64 mg/dL — ABNORMAL HIGH (ref 0.61–1.24)
GFR, EST AFRICAN AMERICAN: 5 mL/min — AB (ref >60)
GFR, EST NON AFRICAN AMERICAN: 4 mL/min — AB (ref >60)
Glucose, Bld: 107 mg/dL — ABNORMAL HIGH (ref 65–99)
PHOSPHORUS: 5.8 mg/dL — AB (ref 2.5–4.6)
POTASSIUM: 4 mmol/L (ref 3.5–5.1)
Sodium: 133 mmol/L — ABNORMAL LOW (ref 135–145)

## 2017-01-07 MED ORDER — LEVOFLOXACIN 500 MG PO TABS: 500.0000 mg | ORAL_TABLET | ORAL | Status: DC

## 2017-01-07 MED ORDER — VANCOMYCIN HCL IN DEXTROSE 1-5 GM/200ML-% IV SOLN: 1000.0000 mg | INTRAVENOUS | 0 refills | Status: DC

## 2017-01-07 MED ORDER — LEVOFLOXACIN 750 MG PO TABS
750.0000 mg | ORAL_TABLET | Freq: Once | ORAL | Status: AC
  Administered 2017-01-07: 750 mg via ORAL
  Filled 2017-01-07: qty 1

## 2017-01-07 MED ORDER — LEVOFLOXACIN 500 MG PO TABS: 500.0000 mg | ORAL_TABLET | ORAL | 0 refills | Status: AC

## 2017-01-07 MED ORDER — RENA-VITE PO TABS: 1.0000 | ORAL_TABLET | Freq: Every day | ORAL | 0 refills | Status: AC

## 2017-01-07 NOTE — Discharge Summary (Addendum)
Physician Discharge Summary  [name] ZOX:[id number] DOB: 03/24/1936 DOA: 01/01/2017  PCP: Crist Fat, MD  Admit date: 01/01/2017 Discharge date: 01/07/2017  Admitted From:  Home  Disposition:  SNF  Recommendations for Outpatient Follow-up:  1. Follow up with PCP in 1-week 2. Patient has been placed on levofloxacin and IV vancomycin (on HD), for the next 10 days, he needs to follow up with vascular surgery as an outpatient for further treatment. 3. Wound care: Aquacel Ag to R foot to help absorb any drainage and sterilize the wound  Home Health: NA  Equipment/Devices: NA  Discharge Condition: Stable  CODE STATUS: Full  Diet recommendation: Heart Healthy/ diabetic and renal.   Brief/Interim Summary: 81 year old male who presents with generalized weakness, altered mental status, worsening pain on the right lower extremity. Patient is known to have end-stage renal disease on hemodialysis, diabetes mellitus type 2, arthritis, hypertension, coronary disease and peripheral vascular disease. He was recently diagnosed with right lower extremity wound infection, he was placed on IV vancomycin during dialysis. He developed worsening symptoms despite IV antibiotics, increased pain and purulent discharge, associated with ambulatory dysfunction. On his initial physical examination blood pressure 125/59, heart rate 78, temperature 97.9, respiratory 14, oxygen saturation 91%. Dry mucous membranes, his lungs were clear to auscultation bilaterally, heart S1-S2 present and rhythmic, no gallops or murmurs, the abdomen was soft nontender, lower extremities with 2+ edema more right than left. Sodium 134, potassium 5.4, chloride 93, bicarbonate 27, glucose 107, BUN 44, creatinine 13.9, calcium 9.3, white count 8.3, hemoglobin 11.0, hematocrit 33.8, platelets 208. Chest x-ray with low lung volumes, right lower lobe atelectasis. EKG sinus rhythm, normal intervals.  Patient was admitted with the working  diagnosis of sepsis due to right lower extremity cellulitis.  1. Right lower extremity cellulitis, complicated by sepsis, present on admission. Patient was admitted to the stepdown unit, he was placed on IV antibiotics with vancomycin and Zosyn with good toleration. His symptoms improved, patient received wound care. Patient was seen by vascular surgery, he underwent angiogram, with heavily calcified aorta, severe tibial artery disease in both legs, not amendable to any easy interventions. Decision was made to continue conservative medical treatment and follow-up as an outpatient, if worsening he will need amputation versus retrograde access procedure for revascularization.   2. Metabolic encephalopathy. Due to sepsis, patient clinically improved, currently presumed to be at his baseline, positive confusion and disorientation. No agitation.  3. End-stage renal disease on hemodialysis. Patient underwent hemodialysis with good toleration, nephrology service was consulted.  4. Type 2 diabetes mellitus. Patient was placed on insulin sliding scale for glucose coverage and monitoring.  5. Peripheral vascular disease, coronary artery disease. Patient was continued on antiplatelet therapy, plan to follow-up as an outpatient.  6. Diastolic heart failure. Chronic and stable. No signs of decompensation. Continue ultrafiltration on hemodialysis.    Discharge Diagnoses:  Principal Problem:   Ulcer of right foot due to type 2 diabetes mellitus (HCC) Active Problems:   Sepsis due to cellulitis [facility])   Hypertension   Coronary artery disease   CKD (chronic kidney disease) stage V requiring chronic dialysis (HCC)   Peripheral vascular disease of lower extremity with ulceration/RLE cellulitis (HCC)   Acute metabolic encephalopathy   Diabetes mellitus   Diabetes mellitus with complication (HCC)    Discharge Instructions   Allergies as of 01/07/2017      Reactions   Iohexol Swelling, Rash   Zanaflex  [tizanidine] Other (See Comments)   Shut kidneys  down Unable to walk or void   Ezetimibe-simvastatin Other (See Comments)   Elevated liver enzymes   Fluvastatin Other (See Comments)   Simvastatin Other (See Comments)   Elevated liver enzymes      Medication List    STOP taking these medications   amoxicillin-clavulanate 500-125 MG tablet Commonly known as:  AUGMENTIN     TAKE these medications   allopurinol 100 MG tablet Commonly known as:  ZYLOPRIM Take 100 mg by mouth every evening.   aspirin EC 81 MG tablet Take 81 mg by mouth every evening.   diphenhydrAMINE 25 mg capsule Commonly known as:  BENADRYL Take 25 mg by mouth every 6 (six) hours as needed for allergies.   levofloxacin 500 MG tablet Commonly known as:  LEVAQUIN Take 1 tablet (500 mg total) by mouth every other day.   multivitamin Tabs tablet Take 1 tablet by mouth at bedtime.   tiZANidine 2 MG tablet Commonly known as:  ZANAFLEX Take 2 mg by mouth every 6 (six) hours as needed for muscle spasms.   vancomycin 1-5 GM/200ML-% Soln Commonly known as:  VANCOCIN Inject 200 mLs (1,000 mg total) into the vein Every Tuesday,Thursday,and Saturday with dialysis. Per pharmacy protocol until August 30th, 2018   VELPHORO 500 MG chewable tablet Generic drug:  sucroferric oxyhydroxide Chew 500 mg by mouth 2 (two) times daily.   vitamin B-12 250 MCG tablet Commonly known as:  CYANOCOBALAMIN Take 250 mcg by mouth every evening.            Home Infusion Instuctions        Start     Ordered   01/07/17 0000  Home infusion instructions    Question:  Instructions  Answer:  Flushing of vascular access device: 0.9% NaCl pre/post medication administration and prn patency; Heparin 100 u/ml, 5ml for implanted ports and Heparin 10u/ml, 5ml for all other central venous catheters.   01/07/17 1608     Contact information for after-discharge care    Destination    [facility] .   Specialty:   Skilled Nursing Facility Contact information: [address] [place] [address] [phone]             Allergies  Allergen Reactions  . Iohexol Swelling and Rash  . Zanaflex [Tizanidine] Other (See Comments)    Shut kidneys down Unable to walk or void  . Ezetimibe-Simvastatin Other (See Comments)    Elevated liver enzymes  . Fluvastatin Other (See Comments)  . Simvastatin Other (See Comments)    Elevated liver enzymes    Consultations:  Nephrology  Vascular surgery   Procedures/Studies: Dg Chest 2 View  Result Date: 12/26/2016 CLINICAL DATA:  Left-sided chest pain and bilateral lower extremity weakness. EXAM: CHEST  2 VIEW COMPARISON:  04/20/2015 FINDINGS: Stable cardiac enlargement and status post prior CABG. Lung volumes are very low bilaterally with bibasilar atelectasis present. There is no evidence of pulmonary edema, consolidation, pneumothorax, nodule or pleural fluid. Visualized bony structures are unremarkable. IMPRESSION: Stable cardiomegaly.  Low lung volumes with bibasilar atelectasis. Electronically Signed   By: Irish Lack M.D.   On: 12/26/2016 00:05   Dg Chest Port 1 View  Result Date: 01/01/2017 CLINICAL DATA:  Nausea and weakness.  End-stage renal disease EXAM: PORTABLE CHEST 1 VIEW COMPARISON:  December 25, 2016 FINDINGS: There is bibasilar atelectatic change. There is no frank edema or consolidation. Heart is mildly enlarged with mild pulmonary venous hypertension. Patient is status post coronary  artery bypass grafting. No adenopathy. No appreciable bone lesions. IMPRESSION: Pulmonary vascular congestion without frank edema or consolidation. There is bibasilar atelectasis. Electronically Signed   By: Bretta Bang III M.D.   On: 01/01/2017 14:08   Dg Foot Complete Right  Result Date: 12/23/2016 CLINICAL DATA:  Right foot pain, swelling and drainage times several days. EXAM: RIGHT FOOT COMPLETE - 3+ VIEW COMPARISON:  CT from  12/19/2016 FINDINGS: There is no evidence of acute fracture or dislocation. Soft tissue swelling of the included ankle and dorsum of the mid and forefoot. Plantar and dorsal calcaneal enthesophytes. No bone destruction to suggest osteomyelitis. Diffuse vascular calcifications are present which can be seen in diabetes. IMPRESSION: Diffuse soft tissue swelling of the included ankle and foot consistent with soft tissue edema or cellulitis. No underlying fracture or evidence of osteomyelitis Electronically Signed   By: Tollie Eth M.D.   On: 12/23/2016 23:05       Subjective: Patient doing well, no nausea, no vomiting, no foot pain. Very weak and deconditioned.  Discharge Exam: Vitals:   01/06/17 2012 01/07/17 0524  BP: (!) 107/50 (!) 109/47  Pulse: (!) 18 66  Resp: 18 18  Temp: 98.6 F (37 C) 98.6 F (37 C)  SpO2: 99% 96%   Vitals:   01/06/17 0400 01/06/17 1400 01/06/17 2012 01/07/17 0524  BP: (!) 122/54 (!) 123/46 (!) 107/50 (!) 109/47  Pulse: 73 71 (!) 18 66  Resp: 16 20 18 18   Temp: 98.7 F (37.1 C) 98.2 F (36.8 C) 98.6 F (37 C) 98.6 F (37 C)  TempSrc: Oral Oral Oral Oral  SpO2: 95% 99% 99% 96%  Weight: 87.5 kg (192 lb 14.4 oz)     Height:        General: Pt is alert, awake, not in acute distress E ENT: mild pallor, no icterus.  Cardiovascular: RRR, S1/S2 +, no rubs, no gallops Respiratory: CTA bilaterally, no wheezing, no rhonchi Abdominal: Soft, NT, ND, bowel sounds + Extremities: no edema, no cyanosis Right foot with no significant edema, no tenderness or erythema    The results of significant diagnostics from this hospitalization (including imaging, microbiology, ancillary and laboratory) are listed below for reference.     Microbiology: Recent Results (from the past 240 hour(s))  Culture, blood (x 2)     Status: None   Collection Time: 01/01/17  2:10 PM  Result Value Ref Range Status   Specimen Description BLOOD AVG  Final   Special Requests   Final     BOTTLES DRAWN AEROBIC AND ANAEROBIC Blood Culture results may not be optimal due to an excessive volume of blood received in culture bottles   Culture NO GROWTH 5 DAYS  Final   Report Status 01/06/2017 FINAL  Final  Culture, blood (x 2)     Status: None   Collection Time: 01/01/17  2:25 PM  Result Value Ref Range Status   Specimen Description BLOOD AVG  Final   Special Requests   Final    BOTTLES DRAWN AEROBIC AND ANAEROBIC Blood Culture results may not be optimal due to an excessive volume of blood received in culture bottles   Culture NO GROWTH 5 DAYS  Final   Report Status 01/06/2017 FINAL  Final     Labs: BNP (last 3 results)  Recent Labs  12/23/16 2004  BNP 371.8*   Basic Metabolic Panel:  Recent Labs Lab 01/01/17 1241 01/01/17 2050  01/03/17 1610 01/04/17 0228 01/05/17 0345 01/06/17 1014 01/07/17 0140  NA 134* 133*  < > 136 136 135 132* 133*  K 5.4* 5.2*  < > 5.1 5.1 4.7 3.9 4.0  CL 93* 92*  < > 97* 95* 93* 94* 92*  CO2 27 22  < > 25 25 25 25 26   GLUCOSE 107* 97  < > 83 138* 125* 86 107*  BUN 44* 21*  < > 38* 29* 54* 39* 49*  CREATININE 13.98* 8.80*  < > 12.08* 8.74* 11.32* 9.19* 10.64*  CALCIUM 9.3 9.7  < > 9.5 9.2 9.7 9.1 8.8*  MG 2.9* 2.5*  --   --   --   --  2.3  --   PHOS  --  5.2*  5.2*  --   --   --   --   --  5.8*  < > = values in this interval not displayed. Liver Function Tests:  Recent Labs Lab 01/01/17 2050 01/02/17 0847 01/07/17 0140  AST  --  145*  --   ALT  --  58  --   ALKPHOS  --  130*  --   BILITOT  --  0.6  --   PROT  --  6.4*  --   ALBUMIN 3.6 2.9* 2.8*   No results for input(s): LIPASE, AMYLASE in the last 168 hours. No results for input(s): AMMONIA in the last 168 hours. CBC:  Recent Labs Lab 01/01/17 1241  01/02/17 0847 01/03/17 0337 01/04/17 0228 01/05/17 0345 01/06/17 0210  WBC 8.3  < > 7.3 6.7 8.0 11.2* 8.6  NEUTROABS 6.3  --   --   --   --  9.1* 5.9  HGB 11.0*  < > 11.1* 10.3* 11.2* 11.4* 11.6*  HCT 33.8*  < >  34.1* 31.4* 33.3* 34.1* 34.4*  MCV 97.4  < > 97.4 97.8 96.0 95.8 93.7  PLT 208  < > 215 203 242 259 260  < > = values in this interval not displayed. Cardiac Enzymes: No results for input(s): CKTOTAL, CKMB, CKMBINDEX, TROPONINI in the last 168 hours. BNP: Invalid input(s): POCBNP CBG:  Recent Labs Lab 01/06/17 1124 01/06/17 1607 01/06/17 2150 01/07/17 0626 01/07/17 1105  GLUCAP 82 110* 204* 100* 152*   D-Dimer No results for input(s): DDIMER in the last 72 hours. Hgb A1c No results for input(s): HGBA1C in the last 72 hours. Lipid Profile No results for input(s): CHOL, HDL, LDLCALC, TRIG, CHOLHDL, LDLDIRECT in the last 72 hours. Thyroid function studies No results for input(s): TSH, T4TOTAL, T3FREE, THYROIDAB in the last 72 hours.  Invalid input(s): FREET3 Anemia work up No results for input(s): VITAMINB12, FOLATE, FERRITIN, TIBC, IRON, RETICCTPCT in the last 72 hours. Urinalysis    Component Value Date/Time   COLORURINE YELLOW 12/26/2016 0024   APPEARANCEUR CLOUDY (A) 12/26/2016 0024   LABSPEC 1.016 12/26/2016 0024   PHURINE 8.0 12/26/2016 0024   GLUCOSEU NEGATIVE 12/26/2016 0024   HGBUR SMALL (A) 12/26/2016 0024   BILIRUBINUR NEGATIVE 12/26/2016 0024   KETONESUR NEGATIVE 12/26/2016 0024   PROTEINUR 100 (A) 12/26/2016 0024   UROBILINOGEN 0.2 03/08/2011 0253   NITRITE NEGATIVE 12/26/2016 0024   LEUKOCYTESUR LARGE (A) 12/26/2016 0024   Sepsis Labs Invalid input(s): PROCALCITONIN,  WBC,  LACTICIDVEN Microbiology Recent Results (from the past 240 hour(s))  Culture, blood (x 2)     Status: None   Collection Time: 01/01/17  2:10 PM  Result Value Ref Range Status   Specimen Description BLOOD AVG  Final   Special Requests   Final  BOTTLES DRAWN AEROBIC AND ANAEROBIC Blood Culture results may not be optimal due to an excessive volume of blood received in culture bottles   Culture NO GROWTH 5 DAYS  Final   Report Status 01/06/2017 FINAL  Final  Culture, blood (x 2)      Status: None   Collection Time: 01/01/17  2:25 PM  Result Value Ref Range Status   Specimen Description BLOOD AVG  Final   Special Requests   Final    BOTTLES DRAWN AEROBIC AND ANAEROBIC Blood Culture results may not be optimal due to an excessive volume of blood received in culture bottles   Culture NO GROWTH 5 DAYS  Final   Report Status 01/06/2017 FINAL  Final     Time coordinating discharge: 45 minutes  SIGNED:   Coralie Keens, MD  [facility] 01/07/2017, 3:56 PM Pager [phone]  If 7PM-7AM, please contact night-coverage [email or web address] Password [email or web address]

## 2017-01-07 NOTE — Progress Notes (Signed)
Physical Therapy Treatment Patient Details Name: [name] MRN: [id number] DOB: [birth year] Today's Date: 01/07/2017    History of Present Illness Patient is a 81 yo male admitted 01/01/17 with AMS, weakness, RLE pain.  Patient with sepsis due to RLE cellulitis.     PMH:  CAD, MI, CABG, ESRD on HD, HOH, arthritis, HTN, PVD    PT Comments    Patient making slow improvements with mobility.  Continues to require +2 assist for mobility and gait.  Continue to recommend SNF at d/c (see Evaluation on 01/05/17).  Follow Up Recommendations  SNF;Supervision for mobility/OOB     Equipment Recommendations  None recommended by PT    Recommendations for Other Services       Precautions / Restrictions Precautions Precautions: Fall Restrictions Weight Bearing Restrictions: No    Mobility  Bed Mobility               General bed mobility comments: Patient in chair as PT entered room.  Patient slid down in recliner with LE's hanging off of foot support.  Required +2 max assist to flatten chair and pull patient back to top of chair.  Transfers Overall transfer level: Needs assistance Equipment used: Rolling walker (2 wheeled) Transfers: Sit to/from Stand Sit to Stand: Mod assist;+2 safety/equipment         General transfer comment: Verbal cues for hand placement.  Assist to rise to standing.  Ambulation/Gait Ambulation/Gait assistance: Mod assist;+2 safety/equipment Ambulation Distance (Feet): 30 Feet (15' x 2 with seated break) Assistive device: Rolling walker (2 wheeled) Gait Pattern/deviations: Step-through pattern;Decreased stride length;Shuffle;Trunk flexed Gait velocity: decreased Gait velocity interpretation: Below normal speed for age/gender General Gait Details: Verbal cues to stay close to RW and stand upright.  Chair behind patient for safety.   Stairs            Wheelchair Mobility    Modified Rankin (Stroke Patients Only)       Balance           Standing balance support: Bilateral upper extremity supported Standing balance-Leahy Scale: Poor                              Cognition Arousal/Alertness: Awake/alert Behavior During Therapy: Flat affect Overall Cognitive Status: No family/caregiver present to determine baseline cognitive functioning Area of Impairment: Orientation;Memory;Following commands;Safety/judgement;Problem solving                 Orientation Level: Disoriented to;Time;Situation   Memory: Decreased short-term memory Following Commands: Follows one step commands with increased time Safety/Judgement: Decreased awareness of safety   Problem Solving: Slow processing;Decreased initiation;Difficulty sequencing;Requires verbal cues        Exercises      General Comments        Pertinent Vitals/Pain Pain Assessment: Faces Faces Pain Scale: Hurts a little bit Pain Location: Feet, R>L Pain Descriptors / Indicators: Aching Pain Intervention(s): Limited activity within patient's tolerance;Monitored during session;Repositioned    Home Living                      Prior Function            PT Goals (current goals can now be found in the care plan section) Acute Rehab PT Goals Patient Stated Goal: none stated Progress towards PT goals: Progressing toward goals    Frequency    Min 3X/week      PT Plan Current plan remains  appropriate    Co-evaluation              AM-PAC PT "6 Clicks" Daily Activity  Outcome Measure  Difficulty turning over in bed (including adjusting bedclothes, sheets and blankets)?: None Difficulty moving from lying on back to sitting on the side of the bed? : Unable Difficulty sitting down on and standing up from a chair with arms (e.g., wheelchair, bedside commode, etc,.)?: Unable Help needed moving to and from a bed to chair (including a wheelchair)?: A Lot Help needed walking in hospital room?: A Lot Help needed climbing 3-5 steps with  a railing? : A Lot 6 Click Score: 12    End of Session Equipment Utilized During Treatment: Gait belt Activity Tolerance: Patient limited by fatigue Patient left: in chair;with call bell/phone within reach;with chair alarm set Nurse Communication: Mobility status PT Visit Diagnosis: Unsteadiness on feet (R26.81);Other abnormalities of gait and mobility (R26.89);Muscle weakness (generalized) (M62.81);History of falling (Z91.81)     Time: 1610-9604 PT Time Calculation (min) (ACUTE ONLY): 17 min  Charges:  $Gait Training: 8-22 mins                    G Codes:       Durenda Hurt. Renaldo Fiddler, [facility] Acute Rehab Services Pager [phone]    Vena Austria 01/07/2017, 5:09 PM

## 2017-01-07 NOTE — Progress Notes (Signed)
Patient will discharge to [facility] and Rehab. Anticipated discharge date: 01/07/17 Family notified: Oluwadamilola Dombrowski, wife Transportation by: Sharin Mons  Nurse to call report to [phone].   CSW signing off.  Abigail Butts, LCSWA  Clinical Social Worker

## 2017-01-07 NOTE — Care Management Note (Signed)
Case Management Note Original Note Created  Oletta Cohn, RN 12/26/2016, 8:15 AM   Patient Details  Name: [name] MRN: [id number] Date of Birth: [birth year]  Subjective/Objective:                  81 y.o. male from home with spouse with medical history significant of ESRD dialysis TTS, last session on Saturday, missed dialysis on Tuesday due to back pain.  Normally ambulatory with unsteady gait at baseline.  But had worsening weakness today throughout day.  Action/Plan: Admit status INPATIENT; anticipate discharge HOME WITH HOME HEALTH, DME.   Expected Discharge Date:  01/07/17               Expected Discharge Plan:  Skilled Nursing Facility  In-House Referral:  Clinical Social Work  Discharge planning Services  CM Consult  Post Acute Care Choice:  NA Choice offered to:  NA  DME Arranged:    DME Agency:     HH Arranged:    HH Agency:     Status of Service:  Completed, signed off  If discussed at [employer], dates discussed:    Additional Comments:  01/07/17- 1600- Cali Cuartas RN, CM- pt stable for d/c today per MD- CSW following for placement needs- pt to go to [facility].   01/06/17- 1700- Silva Bandy Tiyah Zelenak RN, CM- pt with right foot cellulitis- ?SNF placement- CSW to follow for potential SNF. - PT eval pending  Oletta Cohn, RN 12/26/2016, 8:15 AM--HD patient, please notify [facility] [phone]) when pt ready for discharge.  Donn Pierini Ladue, RN 01/07/2017, 4:23 PM [phone]

## 2017-01-07 NOTE — Clinical Social Work Placement (Signed)
   CLINICAL SOCIAL WORK PLACEMENT  NOTE  Date:  01/07/2017  Patient Details  Name: [name] MRN: [id number] Date of Birth: [birth year]  Clinical Social Work is seeking post-discharge placement for this patient at the Skilled  Nursing Facility level of care (*CSW will initial, date and re-position this form in  chart as items are completed):  Yes   Patient/family provided with [facility] Clinical Social Work Department's list of facilities offering this level of care within the geographic area requested by the patient (or if unable, by the patient's family).  Yes   Patient/family informed of their freedom to choose among providers that offer the needed level of care, that participate in Medicare, Medicaid or managed care program needed by the patient, have an available bed and are willing to accept the patient.  Yes   Patient/family informed of [facility]'s ownership interest in [facility] and [facility], as well as of the fact that they are under no obligation to receive care at these facilities.  PASRR submitted to EDS on 01/07/17     PASRR number received on 01/07/17     Existing PASRR number confirmed on       FL2 transmitted to all facilities in geographic area requested by pt/family on 01/07/17     FL2 transmitted to all facilities within larger geographic area on       Patient informed that his/her managed care company has contracts with or will negotiate with certain facilities, including the following:  [facility] and Rehab     Yes   Patient/family informed of bed offers received.  Patient chooses bed at [facility]     Physician recommends and patient chooses bed at      Patient to be transferred to [facility] and Rehab on 01/07/17.  Patient to be transferred to facility by [facility]     Patient family notified on 01/07/17 of transfer.  Name of family member notified:  [name], wife     PHYSICIAN Please prepare priority  discharge summary, including medications, Please prepare prescriptions     Additional Comment:    _______________________________________________ Abigail Butts, LCSW 01/07/2017, 3:40 PM

## 2017-01-07 NOTE — Progress Notes (Signed)
Report called to RN at SNF.

## 2017-01-07 NOTE — Care Management Important Message (Signed)
Important Message  Patient Details  Name: [name] MRN: [id number] Date of Birth: 1935-08-07   Medicare Important Message Given:  Yes    Jaydan Meidinger Abena 01/07/2017, 11:28 AM

## 2017-01-07 NOTE — Telephone Encounter (Signed)
Sched appt 01/30/17 at 8:45. Pt's # has no vm, lm on son's #.

## 2017-01-07 NOTE — Progress Notes (Signed)
ANTIBIOTIC CONSULT NOTE - INITIAL  Pharmacy Consult for Levaquin + Vanco Indication: diabetic foot infection  Allergies  Allergen Reactions  . Iohexol Swelling and Rash  . Zanaflex [Tizanidine] Other (See Comments)    Shut kidneys down Unable to walk or void  . Ezetimibe-Simvastatin Other (See Comments)    Elevated liver enzymes  . Fluvastatin Other (See Comments)  . Simvastatin Other (See Comments)    Elevated liver enzymes    Patient Measurements: Height: 5\' 10"  (177.8 cm) Weight: 192 lb 14.4 oz (87.5 kg) IBW/kg (Calculated) : 73 Adjusted Body Weight:    Vital Signs: Temp: 98.6 F (37 C) (08/20 0524) Temp Source: Oral (08/20 0524) BP: 109/47 (08/20 0524) Pulse Rate: 66 (08/20 0524) Intake/Output from previous day: No intake/output data recorded. Intake/Output from this shift: No intake/output data recorded.  Labs:  Recent Labs  01/05/17 0345 01/06/17 0210 01/06/17 1014 01/07/17 0140  WBC 11.2* 8.6  --   --   HGB 11.4* 11.6*  --   --   PLT 259 260  --   --   CREATININE 11.32*  --  9.19* 10.64*   Estimated Creatinine Clearance: 5.8 mL/min (A) (by C-G formula based on SCr of 10.64 mg/dL (H)). No results for input(s): VANCOTROUGH, VANCOPEAK, VANCORANDOM, GENTTROUGH, GENTPEAK, GENTRANDOM, TOBRATROUGH, TOBRAPEAK, TOBRARND, AMIKACINPEAK, AMIKACINTROU, AMIKACIN in the last 72 hours.   Microbiology:   Medical History: Past Medical History:  Diagnosis Date  . Anginal pain (HCC)    before CABG  . Arthritis   . CHF (congestive heart failure) (HCC)   . Constipation   . Coronary artery disease   . Diabetes mellitus   . ESRD (end stage renal disease) (HCC)    TuesThurs Sat [place]  . Hypertension   . Myocardial infarction (HCC)   . Pneumonia    2 years ago    Assessment: ID: day #7 Vanc (#6 Zosyn) for ulceration R 3rd,4th. VVS suspects minimally needs amputation of 3-5th toes. If infxn spread to mid-foot, may need R BKA vs AKA  Was on vancomycin during  last admit (doxy>augmentin/vanco). Last dose documented on 8/11. No load on 8/14 given recent vanc administration. Afeb, WBC nml, ESRD  Change Zosyn to Levaquin in preparation for discharge with concerns for PSA with diabetic foot infection. Dose for ESRD.  Antimicrobials this admission:  Levaquin 8/20>> Zosyn 8/15 >> 8/20 Vancomycin 8/14 >>  * 8/16 pre-HD VR = 16, likely not getting full session since going for angiogram, but will still give 1g given level on low side  Microbiology results:  8/14 BCx: Negative 8/8 BCx: neg 8/8 MRSA PCR: neg  Goal of Therapy:  Vancomycin level <15 prior to re-dosing post-HD. Eradication of infection  Plan:  - Vancomycin 1000mg  IV qHD-TTS - d/c Zosyn, Start Levaquin 750mg  x 1 then 500mg  po q 48h  [name] S. Merilynn Finland, PharmD, BCPS Clinical Staff Pharmacist Pager [phone]  [name] 01/07/2017,12:58 PM

## 2017-01-07 NOTE — Plan of Care (Signed)
Problem: Activity: Goal: Risk for activity intolerance will decrease Outcome: Progressing Pt willing to begin getting out of bed and ambulating as tolerated

## 2017-01-07 NOTE — Progress Notes (Signed)
[facility] Progress Note  Subjective: doing well, Ox 2.5, doesn't know year but does know day, place, last president  Vitals:   01/06/17 0400 01/06/17 1400 01/06/17 2012 01/07/17 0524  BP: (!) 122/54 (!) 123/46 (!) 107/50 (!) 109/47  Pulse: 73 71 (!) 18 66  Resp: 16 20 18 18   Temp: 98.7 F (37.1 C) 98.2 F (36.8 C) 98.6 F (37 C) 98.6 F (37 C)  TempSrc: Oral Oral Oral Oral  SpO2: 95% 99% 99% 96%  Weight: 87.5 kg (192 lb 14.4 oz)     Height:        Inpatient medications: . allopurinol  100 mg Oral QPM  . aspirin EC  81 mg Oral QPM  . cyanocobalamin  250 mcg Oral QPM  . darbepoetin (ARANESP) injection - DIALYSIS  100 mcg Intravenous Q Thu-HD  . heparin  5,000 Units Subcutaneous Q8H  . insulin aspart  0-9 Units Subcutaneous TID WC  . [START ON 01/09/2017] levofloxacin  500 mg Oral Q48H  . levofloxacin  750 mg Oral Once  . multivitamin  1 tablet Oral QHS  . sodium chloride flush  3 mL Intravenous Q12H  . sodium chloride flush  3 mL Intravenous Q12H  . sucroferric oxyhydroxide  1,000 mg Oral TID WC   . sodium chloride 250 mL (01/03/17 1851)  . vancomycin Stopped (01/05/17 1157)   sodium chloride, acetaminophen **OR** acetaminophen, hydrALAZINE, labetalol, ondansetron (ZOFRAN) IV, oxyCODONE, sodium chloride flush, tiZANidine  Exam: Gen alert, elderly HOH, no distress No rash, cyanosis or gangrene Sclera anicteric, throat clear  No jvd or bruits Chest clear bilat RRR no MRG Abd soft ntnd no mass or ascites +bs GU normal male MS no joint effusions or deformity Ext 1+ bilat LE edema / R foot is erythematous w/ some maceration between toes 3-4-5 Neuro is alert, Ox 3 , nf     Dialysis:  [facility] 4h  107.5kg  2/2 bath  Hep 7000   LFA AVG - parsabiv 2.5 - calc 0.25 ug tiw - no esa   Assessment: 1. Ischemic Rt foot with cellulitis - cellulitis improving.  D # 6 of IV abx vanc/ zosyn.  Sig vasc disease per VVS.  To f/u in OP setting w/ VVS for  vasc insufficiency.  Per primary prob needs SNF.  2. Anemia 3. Sec HPTH on parsabiv at outpt but not available inpt 4. DM  Not on insulin 5. HTN 6. ESRD  TTS [facility]   Plan: 1. HD tues 2. Probably SNF placement   Vinson Moselle MD [facility] pager [phone]   01/07/2017, 1:53 PM    Recent Labs Lab 01/01/17 2050  01/05/17 0345 01/06/17 1014 01/07/17 0140  NA 133*  < > 135 132* 133*  K 5.2*  < > 4.7 3.9 4.0  CL 92*  < > 93* 94* 92*  CO2 22  < > 25 25 26   GLUCOSE 97  < > 125* 86 107*  BUN 21*  < > 54* 39* 49*  CREATININE 8.80*  < > 11.32* 9.19* 10.64*  CALCIUM 9.7  < > 9.7 9.1 8.8*  PHOS 5.2*  5.2*  --   --   --  5.8*  < > = values in this interval not displayed.  Recent Labs Lab 01/01/17 2050 01/02/17 0847 01/07/17 0140  AST  --  145*  --   ALT  --  58  --   ALKPHOS  --  130*  --  BILITOT  --  0.6  --   PROT  --  6.4*  --   ALBUMIN 3.6 2.9* 2.8*    Recent Labs Lab 01/01/17 1241  01/04/17 0228 01/05/17 0345 01/06/17 0210  WBC 8.3  < > 8.0 11.2* 8.6  NEUTROABS 6.3  --   --  9.1* 5.9  HGB 11.0*  < > 11.2* 11.4* 11.6*  HCT 33.8*  < > 33.3* 34.1* 34.4*  MCV 97.4  < > 96.0 95.8 93.7  PLT 208  < > 242 259 260  < > = values in this interval not displayed. Iron/TIBC/Ferritin/ %Sat    Component Value Date/Time   IRON 30 (L) 12/01/2007 0700   TIBC 221 12/01/2007 0700   FERRITIN 235 12/01/2007 0700   IRONPCTSAT 14 (L) 12/01/2007 0700

## 2017-01-07 NOTE — NC FL2 (Signed)
[place] MEDICAID FL2 LEVEL OF CARE SCREENING TOOL     IDENTIFICATION  Patient Name: [name] Birthdate: [birth year] Sex: male Admission Date (Current Location): 01/01/2017  [facility] and [place] Number:  Producer, television/film/video and Address:  [facility] [place], [address], [place], [place] [address]      Provider Number: [id number]  Attending Physician Name and Address:  Coralie Keens,*  Relative Name and Phone Number:  Nimrod, Wendt, [phone]     Current Level of Care: Hospital Recommended Level of Care: Skilled Nursing Facility Prior Approval Number:    Date Approved/Denied:   PASRR Number: [id number]  Discharge Plan: SNF    Current Diagnoses: Patient Active Problem List   Diagnosis Date Noted  . Sepsis due to cellulitis (HCC) 01/01/2017  . Hypertension 01/01/2017  . Coronary artery disease 01/01/2017  . CKD (chronic kidney disease) stage V requiring chronic dialysis (HCC) 01/01/2017  . Peripheral vascular disease of lower extremity with ulceration/RLE cellulitis (HCC) 01/01/2017  . Acute metabolic encephalopathy 01/01/2017  . Diabetes mellitus 01/01/2017  . Diabetes mellitus with complication (HCC)   . Hyperkalemia 12/26/2016  . Ulcer of right foot due to type 2 diabetes mellitus (HCC) 12/26/2016  . Cellulitis of right foot 12/26/2016  . Noncompliance of patient with renal dialysis (HCC) 12/26/2016  . End stage renal disease (HCC) 09/09/2013    Orientation RESPIRATION BLADDER Height & Weight     Self  Normal Continent Weight: 192 lb 14.4 oz (87.5 kg) Height:  5\' 10"  (177.8 cm)  BEHAVIORAL SYMPTOMS/MOOD NEUROLOGICAL BOWEL NUTRITION STATUS      Continent Diet (renal, carb modified; please see DC summary)  AMBULATORY STATUS COMMUNICATION OF NEEDS Skin   Extensive Assist Verbally Other (Comment) (cellulitis right leg, diabetic ulcer right leg between toes, silver dressings)                       Personal Care  Assistance Level of Assistance  Bathing, Feeding, Dressing Bathing Assistance: Limited assistance Feeding assistance: Limited assistance Dressing Assistance: Limited assistance     Functional Limitations Info  Sight, Hearing, Speech Sight Info: Impaired Hearing Info: Impaired Speech Info: Adequate    SPECIAL CARE FACTORS FREQUENCY  PT (By licensed PT)     PT Frequency: 5x/week              Contractures      Additional Factors Info  Code Status, Allergies Code Status Info: Full Allergies Info: Iohexol, Zanaflex Tizanidine, Ezetimibe-simvastatin, Fluvastatin, Simvastatin           Current Medications (01/07/2017):  This is the current hospital active medication list Current Facility-Administered Medications  Medication Dose Route Frequency Provider Last Rate Last Dose  . 0.9 %  sodium chloride infusion  250 mL Intravenous PRN Fransisco Hertz, MD 10 mL/hr at 01/03/17 1851 250 mL at 01/03/17 1851  . acetaminophen (TYLENOL) tablet 650 mg  650 mg Oral Q6H PRN Russella Dar, NP       Or  . acetaminophen (TYLENOL) suppository 650 mg  650 mg Rectal Q6H PRN Russella Dar, NP      . allopurinol (ZYLOPRIM) tablet 100 mg  100 mg Oral QPM Russella Dar, NP   100 mg at 01/06/17 1845  . aspirin EC tablet 81 mg  81 mg Oral QPM Russella Dar, NP   81 mg at 01/06/17 1845  . cyanocobalamin tablet 250 mcg  250 mcg Oral QPM Rennis Harding,  Kelle Darting, NP   250 mcg at 01/06/17 1847  . Darbepoetin Alfa (ARANESP) injection 100 mcg  100 mcg Intravenous Q Thu-HD Primitivo Gauze, MD   100 mcg at 01/03/17 1037  . heparin injection 5,000 Units  5,000 Units Subcutaneous Q8H Fransisco Hertz, MD   5,000 Units at 01/07/17 2355  . hydrALAZINE (APRESOLINE) injection 5 mg  5 mg Intravenous Q20 Min PRN Fransisco Hertz, MD      . insulin aspart (novoLOG) injection 0-9 Units  0-9 Units Subcutaneous TID WC Anju Sereno, York Ram, MD   3 Units at 01/04/17 1715  . labetalol (NORMODYNE,TRANDATE) injection 10 mg   10 mg Intravenous Q10 min PRN Fransisco Hertz, MD      . multivitamin (RENA-VIT) tablet 1 tablet  1 tablet Oral QHS Primitivo Gauze, MD   1 tablet at 01/06/17 2130  . ondansetron (ZOFRAN) injection 4 mg  4 mg Intravenous Q6H PRN Fransisco Hertz, MD      . oxyCODONE (Oxy IR/ROXICODONE) immediate release tablet 5-10 mg  5-10 mg Oral Q4H PRN Russella Dar, NP   5 mg at 01/06/17 0333  . piperacillin-tazobactam (ZOSYN) IVPB 3.375 g  3.375 g Intravenous Once Junious Silk L, NP      . piperacillin-tazobactam (ZOSYN) IVPB 3.375 g  3.375 g Intravenous Q12H Junious Silk L, NP 12.5 mL/hr at 01/07/17 0628 3.375 g at 01/07/17 7322  . sodium chloride flush (NS) 0.9 % injection 3 mL  3 mL Intravenous Q12H Junious Silk L, NP   3 mL at 01/06/17 1000  . sodium chloride flush (NS) 0.9 % injection 3 mL  3 mL Intravenous Q12H Fransisco Hertz, MD   3 mL at 01/06/17 1000  . sodium chloride flush (NS) 0.9 % injection 3 mL  3 mL Intravenous PRN Fransisco Hertz, MD      . sucroferric oxyhydroxide [place]) chewable tablet 1,000 mg  1,000 mg Oral TID WC Primitivo Gauze, MD   1,000 mg at 01/07/17 0254  . tiZANidine (ZANAFLEX) tablet 2 mg  2 mg Oral Q6H PRN Russella Dar, NP      . vancomycin (VANCOCIN) IVPB 1000 mg/200 mL premix  1,000 mg Intravenous Q T,Th,Sa-HD Russella Dar, NP   Stopped at 01/05/17 1157     Discharge Medications: Please see discharge summary for a list of discharge medications.  Relevant Imaging Results:  Relevant Lab Results:   Additional Information SSN: [id number]  Abigail Butts, LCSW

## 2017-01-07 NOTE — Telephone Encounter (Signed)
-----   Message from Phillips Odor, RN sent at 01/07/2017  1:09 PM EDT ----- Regarding: Needs 2-3 week f/u with Dr. Randie Heinz; wound check/ discuss next procedure    ----- Message ----- From: Maeola Harman, MD Sent: 01/04/2017   3:29 PM To: Vvs Charge Pool  MUZZAMMIL STANDISH [id number] 05-29-1935  F/u in 2-3 weeks for wound check and to discuss possible retrograde intervention of rle

## 2017-01-07 NOTE — Plan of Care (Signed)
Problem: Safety: Goal: Ability to remain free from injury will improve Outcome: Progressing Pt is more oriented and compliant today and acknowledges barriers to safety.

## 2017-01-08 DIAGNOSIS — L03115 Cellulitis of right lower limb: Secondary | ICD-10-CM | POA: Diagnosis not present

## 2017-01-08 DIAGNOSIS — D689 Coagulation defect, unspecified: Secondary | ICD-10-CM | POA: Diagnosis not present

## 2017-01-08 DIAGNOSIS — R52 Pain, unspecified: Secondary | ICD-10-CM | POA: Diagnosis not present

## 2017-01-08 DIAGNOSIS — E1129 Type 2 diabetes mellitus with other diabetic kidney complication: Secondary | ICD-10-CM | POA: Diagnosis not present

## 2017-01-08 DIAGNOSIS — D631 Anemia in chronic kidney disease: Secondary | ICD-10-CM | POA: Diagnosis not present

## 2017-01-08 DIAGNOSIS — N2581 Secondary hyperparathyroidism of renal origin: Secondary | ICD-10-CM | POA: Diagnosis not present

## 2017-01-08 DIAGNOSIS — N186 End stage renal disease: Secondary | ICD-10-CM | POA: Diagnosis not present

## 2017-01-09 DIAGNOSIS — I739 Peripheral vascular disease, unspecified: Secondary | ICD-10-CM | POA: Diagnosis not present

## 2017-01-09 DIAGNOSIS — L03115 Cellulitis of right lower limb: Secondary | ICD-10-CM | POA: Diagnosis not present

## 2017-01-09 DIAGNOSIS — I10 Essential (primary) hypertension: Secondary | ICD-10-CM | POA: Diagnosis not present

## 2017-01-09 DIAGNOSIS — N186 End stage renal disease: Secondary | ICD-10-CM | POA: Diagnosis not present

## 2017-01-11 DIAGNOSIS — N186 End stage renal disease: Secondary | ICD-10-CM | POA: Diagnosis not present

## 2017-01-11 DIAGNOSIS — R52 Pain, unspecified: Secondary | ICD-10-CM | POA: Diagnosis not present

## 2017-01-11 DIAGNOSIS — D689 Coagulation defect, unspecified: Secondary | ICD-10-CM | POA: Diagnosis not present

## 2017-01-11 DIAGNOSIS — D631 Anemia in chronic kidney disease: Secondary | ICD-10-CM | POA: Diagnosis not present

## 2017-01-11 DIAGNOSIS — N2581 Secondary hyperparathyroidism of renal origin: Secondary | ICD-10-CM | POA: Diagnosis not present

## 2017-01-11 DIAGNOSIS — E1129 Type 2 diabetes mellitus with other diabetic kidney complication: Secondary | ICD-10-CM | POA: Diagnosis not present

## 2017-01-11 DIAGNOSIS — L03115 Cellulitis of right lower limb: Secondary | ICD-10-CM | POA: Diagnosis not present

## 2017-01-14 DIAGNOSIS — R52 Pain, unspecified: Secondary | ICD-10-CM | POA: Diagnosis not present

## 2017-01-14 DIAGNOSIS — N186 End stage renal disease: Secondary | ICD-10-CM | POA: Diagnosis not present

## 2017-01-14 DIAGNOSIS — D689 Coagulation defect, unspecified: Secondary | ICD-10-CM | POA: Diagnosis not present

## 2017-01-14 DIAGNOSIS — N2581 Secondary hyperparathyroidism of renal origin: Secondary | ICD-10-CM | POA: Diagnosis not present

## 2017-01-14 DIAGNOSIS — L03115 Cellulitis of right lower limb: Secondary | ICD-10-CM | POA: Diagnosis not present

## 2017-01-14 DIAGNOSIS — D631 Anemia in chronic kidney disease: Secondary | ICD-10-CM | POA: Diagnosis not present

## 2017-01-14 DIAGNOSIS — E1129 Type 2 diabetes mellitus with other diabetic kidney complication: Secondary | ICD-10-CM | POA: Diagnosis not present

## 2017-01-16 DIAGNOSIS — D689 Coagulation defect, unspecified: Secondary | ICD-10-CM | POA: Diagnosis not present

## 2017-01-16 DIAGNOSIS — L03115 Cellulitis of right lower limb: Secondary | ICD-10-CM | POA: Diagnosis not present

## 2017-01-16 DIAGNOSIS — E1129 Type 2 diabetes mellitus with other diabetic kidney complication: Secondary | ICD-10-CM | POA: Diagnosis not present

## 2017-01-16 DIAGNOSIS — N186 End stage renal disease: Secondary | ICD-10-CM | POA: Diagnosis not present

## 2017-01-16 DIAGNOSIS — D631 Anemia in chronic kidney disease: Secondary | ICD-10-CM | POA: Diagnosis not present

## 2017-01-16 DIAGNOSIS — N2581 Secondary hyperparathyroidism of renal origin: Secondary | ICD-10-CM | POA: Diagnosis not present

## 2017-01-16 DIAGNOSIS — R52 Pain, unspecified: Secondary | ICD-10-CM | POA: Diagnosis not present

## 2017-01-18 DIAGNOSIS — L03115 Cellulitis of right lower limb: Secondary | ICD-10-CM | POA: Diagnosis not present

## 2017-01-18 DIAGNOSIS — Z992 Dependence on renal dialysis: Secondary | ICD-10-CM | POA: Diagnosis not present

## 2017-01-18 DIAGNOSIS — D689 Coagulation defect, unspecified: Secondary | ICD-10-CM | POA: Diagnosis not present

## 2017-01-18 DIAGNOSIS — R52 Pain, unspecified: Secondary | ICD-10-CM | POA: Diagnosis not present

## 2017-01-18 DIAGNOSIS — D631 Anemia in chronic kidney disease: Secondary | ICD-10-CM | POA: Diagnosis not present

## 2017-01-18 DIAGNOSIS — N2581 Secondary hyperparathyroidism of renal origin: Secondary | ICD-10-CM | POA: Diagnosis not present

## 2017-01-18 DIAGNOSIS — E1129 Type 2 diabetes mellitus with other diabetic kidney complication: Secondary | ICD-10-CM | POA: Diagnosis not present

## 2017-01-18 DIAGNOSIS — N186 End stage renal disease: Secondary | ICD-10-CM | POA: Diagnosis not present

## 2017-01-21 DIAGNOSIS — N2581 Secondary hyperparathyroidism of renal origin: Secondary | ICD-10-CM | POA: Diagnosis not present

## 2017-01-21 DIAGNOSIS — D631 Anemia in chronic kidney disease: Secondary | ICD-10-CM | POA: Diagnosis not present

## 2017-01-21 DIAGNOSIS — E1129 Type 2 diabetes mellitus with other diabetic kidney complication: Secondary | ICD-10-CM | POA: Diagnosis not present

## 2017-01-21 DIAGNOSIS — R6883 Chills (without fever): Secondary | ICD-10-CM | POA: Diagnosis not present

## 2017-01-21 DIAGNOSIS — N186 End stage renal disease: Secondary | ICD-10-CM | POA: Diagnosis not present

## 2017-01-21 DIAGNOSIS — D689 Coagulation defect, unspecified: Secondary | ICD-10-CM | POA: Diagnosis not present

## 2017-01-21 DIAGNOSIS — R52 Pain, unspecified: Secondary | ICD-10-CM | POA: Diagnosis not present

## 2017-01-23 ENCOUNTER — Encounter: Payer: Medicare Other | Admitting: Surgery

## 2017-01-23 ENCOUNTER — Encounter (HOSPITAL_COMMUNITY): Payer: Medicare Other

## 2017-01-23 DIAGNOSIS — N2581 Secondary hyperparathyroidism of renal origin: Secondary | ICD-10-CM | POA: Diagnosis not present

## 2017-01-23 DIAGNOSIS — E1129 Type 2 diabetes mellitus with other diabetic kidney complication: Secondary | ICD-10-CM | POA: Diagnosis not present

## 2017-01-23 DIAGNOSIS — D631 Anemia in chronic kidney disease: Secondary | ICD-10-CM | POA: Diagnosis not present

## 2017-01-23 DIAGNOSIS — D689 Coagulation defect, unspecified: Secondary | ICD-10-CM | POA: Diagnosis not present

## 2017-01-23 DIAGNOSIS — R6883 Chills (without fever): Secondary | ICD-10-CM | POA: Diagnosis not present

## 2017-01-23 DIAGNOSIS — R52 Pain, unspecified: Secondary | ICD-10-CM | POA: Diagnosis not present

## 2017-01-23 DIAGNOSIS — N186 End stage renal disease: Secondary | ICD-10-CM | POA: Diagnosis not present

## 2017-01-25 ENCOUNTER — Encounter (HOSPITAL_COMMUNITY): Payer: Self-pay | Admitting: Emergency Medicine

## 2017-01-25 ENCOUNTER — Emergency Department (HOSPITAL_COMMUNITY)
Admission: EM | Admit: 2017-01-25 | Discharge: 2017-01-25 | Disposition: A | Discharge status: Home / Self Care | Payer: Medicare Other | Attending: Emergency Medicine | Admitting: Emergency Medicine

## 2017-01-25 ENCOUNTER — Emergency Department (HOSPITAL_COMMUNITY): Payer: Medicare Other

## 2017-01-25 ENCOUNTER — Ambulatory Visit: Payer: Medicare Other | Admitting: Vascular Surgery

## 2017-01-25 DIAGNOSIS — I509 Heart failure, unspecified: Secondary | ICD-10-CM | POA: Diagnosis not present

## 2017-01-25 DIAGNOSIS — I998 Other disorder of circulatory system: Secondary | ICD-10-CM | POA: Diagnosis not present

## 2017-01-25 DIAGNOSIS — I7389 Other specified peripheral vascular diseases: Secondary | ICD-10-CM | POA: Diagnosis not present

## 2017-01-25 DIAGNOSIS — Z992 Dependence on renal dialysis: Secondary | ICD-10-CM | POA: Diagnosis not present

## 2017-01-25 DIAGNOSIS — N186 End stage renal disease: Secondary | ICD-10-CM | POA: Diagnosis not present

## 2017-01-25 DIAGNOSIS — S91302A Unspecified open wound, left foot, initial encounter: Secondary | ICD-10-CM | POA: Diagnosis not present

## 2017-01-25 DIAGNOSIS — M25571 Pain in right ankle and joints of right foot: Secondary | ICD-10-CM | POA: Diagnosis not present

## 2017-01-25 DIAGNOSIS — M79671 Pain in right foot: Secondary | ICD-10-CM | POA: Diagnosis not present

## 2017-01-25 DIAGNOSIS — I70269 Atherosclerosis of native arteries of extremities with gangrene, unspecified extremity: Secondary | ICD-10-CM | POA: Diagnosis not present

## 2017-01-25 DIAGNOSIS — I2510 Atherosclerotic heart disease of native coronary artery without angina pectoris: Secondary | ICD-10-CM | POA: Diagnosis not present

## 2017-01-25 DIAGNOSIS — I96 Gangrene, not elsewhere classified: Secondary | ICD-10-CM | POA: Diagnosis not present

## 2017-01-25 DIAGNOSIS — S91301A Unspecified open wound, right foot, initial encounter: Secondary | ICD-10-CM | POA: Diagnosis not present

## 2017-01-25 DIAGNOSIS — I739 Peripheral vascular disease, unspecified: Secondary | ICD-10-CM | POA: Diagnosis not present

## 2017-01-25 DIAGNOSIS — I132 Hypertensive heart and chronic kidney disease with heart failure and with stage 5 chronic kidney disease, or end stage renal disease: Secondary | ICD-10-CM | POA: Insufficient documentation

## 2017-01-25 DIAGNOSIS — Z7982 Long term (current) use of aspirin: Secondary | ICD-10-CM | POA: Diagnosis not present

## 2017-01-25 DIAGNOSIS — I252 Old myocardial infarction: Secondary | ICD-10-CM | POA: Diagnosis not present

## 2017-01-25 LAB — CBC WITH DIFFERENTIAL/PLATELET
BASOS ABS: 0 10*3/uL (ref 0.0–0.1)
BASOS PCT: 1 %
EOS ABS: 0.3 10*3/uL (ref 0.0–0.7)
EOS PCT: 4 %
HCT: 32.9 % — ABNORMAL LOW (ref 39.0–52.0)
HEMOGLOBIN: 10.6 g/dL — AB (ref 13.0–17.0)
Lymphocytes Relative: 17 %
Lymphs Abs: 1.3 10*3/uL (ref 0.7–4.0)
MCH: 31.4 pg (ref 26.0–34.0)
MCHC: 32.2 g/dL (ref 30.0–36.0)
MCV: 97.3 fL (ref 78.0–100.0)
MONO ABS: 0.4 10*3/uL (ref 0.1–1.0)
MONOS PCT: 6 %
NEUTROS ABS: 5.7 10*3/uL (ref 1.7–7.7)
Neutrophils Relative %: 72 %
Platelets: 276 10*3/uL (ref 150–400)
RBC: 3.38 MIL/uL — AB (ref 4.22–5.81)
RDW: 14.4 % (ref 11.5–15.5)
WBC: 7.8 10*3/uL (ref 4.0–10.5)

## 2017-01-25 LAB — COMPREHENSIVE METABOLIC PANEL
ALBUMIN: 3.1 g/dL — AB (ref 3.5–5.0)
ALK PHOS: 145 U/L — AB (ref 38–126)
ALT: 31 U/L (ref 17–63)
ANION GAP: 14 (ref 5–15)
AST: 30 U/L (ref 15–41)
BUN: 34 mg/dL — AB (ref 6–20)
CALCIUM: 9.7 mg/dL (ref 8.9–10.3)
CO2: 29 mmol/L (ref 22–32)
Chloride: 91 mmol/L — ABNORMAL LOW (ref 101–111)
Creatinine, Ser: 8.25 mg/dL — ABNORMAL HIGH (ref 0.61–1.24)
GFR calc Af Amer: 6 mL/min — ABNORMAL LOW (ref >60)
GFR calc non Af Amer: 5 mL/min — ABNORMAL LOW (ref >60)
GLUCOSE: 74 mg/dL (ref 65–99)
Potassium: 5.7 mmol/L — ABNORMAL HIGH (ref 3.5–5.1)
SODIUM: 134 mmol/L — AB (ref 135–145)
Total Bilirubin: 0.5 mg/dL (ref 0.3–1.2)
Total Protein: 7.2 g/dL (ref 6.5–8.1)

## 2017-01-25 LAB — I-STAT CG4 LACTIC ACID, ED: Lactic Acid, Venous: 1.51 mmol/L (ref 0.5–1.9)

## 2017-01-25 MED ORDER — SODIUM POLYSTYRENE SULFONATE 15 GM/60ML PO SUSP
30.0000 g | Freq: Once | ORAL | Status: AC
  Administered 2017-01-25: 30 g via ORAL
  Filled 2017-01-25: qty 120

## 2017-01-25 MED ORDER — FENTANYL CITRATE (PF) 100 MCG/2ML IJ SOLN
50.0000 ug | Freq: Once | INTRAMUSCULAR | Status: AC
  Administered 2017-01-25: 50 ug via INTRAVENOUS
  Filled 2017-01-25 (×2): qty 2

## 2017-01-25 MED ORDER — SUCROFERRIC OXYHYDROXIDE 500 MG PO CHEW
1000.0000 mg | CHEWABLE_TABLET | Freq: Three times a day (TID) | ORAL | Status: DC
  Filled 2017-01-25 (×2): qty 2

## 2017-01-25 MED ORDER — CALCITRIOL 0.25 MCG PO CAPS: 0.2500 ug | ORAL_CAPSULE | ORAL | Status: DC

## 2017-01-25 MED ORDER — RENA-VITE PO TABS
1.0000 | ORAL_TABLET | Freq: Every day | ORAL | Status: DC
  Filled 2017-01-25: qty 1

## 2017-01-25 NOTE — Progress Notes (Addendum)
[facility]    HPI: He was seen in our office by Dr. Randie Heinz due to right foot toe ulcer development.  He under went angiogram by Dr. Imogene Burn on 01/03/2017.     Right Left  RA Small patent proximal segment with occlusion at first branch branch point Small patent proximal segment with occlusion at first branch branch point  CIA Patent, calcified Patent, calcified  EIA Patent, calcified Patent, calcified  IIA Patent, calcified, sub-total occlusion at bifurcation Patent, calcified, sub-total occlusion at bifurcation  CFA Patent, calcfied Patent, calcfied    SFA Patent, calcfied, proximal 1/3 30% stenosis   Patent, calcfied, proximal 1/3 30% stenosis    PFA Patent patent  Pop Patent with proximal stenosis 50-75% Patent  Trif Occluded Diseased with occluded distal tibioperoneal trunk   AT Occluded, collaterals reconstitute a diseased distal segment Occluded shortly after takeoff  Pero Occluded Occludes shortly after takeoff  PT Occluded Occludes in mid-segment   Per Dr. Nicky Pugh report after the angiogram" Based on the images, no intervention is possible.  The family and patient will need to consider a palliative right below-knee amputation vs above-knee amputation."  Subjective  - He has no family present.  He is refusing to take off his pants.  He was brought to the ED secondary to nursing staff stating the right 4th and 5th toes are"looking worse."   Objective (!) 168/105 91 98 F (36.7 C) (Oral) (!) 8 99% No intake or output data in the 24 hours ending 01/25/17 1604  CBC    Component Value Date/Time   WBC 7.8 01/25/2017 1355   RBC 3.38 (L) 01/25/2017 1355   HGB 10.6 (L) 01/25/2017 1355   HCT 32.9 (L) 01/25/2017 1355   PLT 276 01/25/2017 1355   MCV 97.3 01/25/2017 1355   MCH 31.4 01/25/2017 1355   MCHC 32.2 01/25/2017 1355   RDW 14.4 01/25/2017 1355   LYMPHSABS 1.3 01/25/2017 1355   MONOABS 0.4 01/25/2017 1355   EOSABS 0.3 01/25/2017 1355   BASOSABS 0.0 01/25/2017 1355   Doppler signal Bilaterally DP/Peroneal monophasic Palpable femoral pulses Bilateral Abdomin is soft NTTP Heart RRR Lungs non labored breathing  Assessment/Planning: Sever tibial calcification with occlusions Bilateral LE I discussed his options again that he needs to consider BKA verses AKA if the pain is intolerable.  He states he has not thought about it.  Suggest internal medicine admission and palliative involvement.   He has declined HD today as well.    Clinton Gallant [facility] 01/25/2017 4:04 PM -- History and exam details as above.  Pt has dry gangrenous changes toes 4 and 5 right foot and ulcers dorsum of left toes 2 and 3.  He currently is consenting to amputation of his leg.  He is not currently septic and has follow up scheduled with Dr Randie Heinz 9/12 at 845 am. He will have further discussions with the pt at that time regarding amputation of leg versus retrograde angioplasty which at this point I believe foot is not salvageable.  Pt is not septic and has close followup.  Would do dry dressing right foot and continue current wound care left foot.  Fabienne Bruns, MD [facility] Office: [phone] Pager: [phone]  Laboratory Lab Results:  Recent Labs  01/25/17 1355  WBC 7.8  HGB 10.6*  HCT 32.9*  PLT 276   BMET  Recent Labs  01/25/17 1355  NA 134*  K 5.7*  CL 91*  CO2 29  GLUCOSE 74  BUN 34*  CREATININE 8.25*  CALCIUM 9.7    COAG Lab Results  Component Value Date   INR 1.10 01/01/2017   INR 1.3 12/04/2008   INR 1.0 08/21/2007   No results found for: PTT

## 2017-01-25 NOTE — ED Notes (Signed)
Pt departed in care of PTAR crew and in NAD.

## 2017-01-25 NOTE — ED Notes (Signed)
Patient transported to X-ray 

## 2017-01-25 NOTE — ED Triage Notes (Signed)
[facility] from [facility] [place] with [facility]. Right  foot with noted gangrene and pain. A/O NAD. Hx Dialysis.

## 2017-01-25 NOTE — ED Provider Notes (Signed)
[facility]PT Provider Note   CSN: [id number] Arrival date & time: 01/25/17  1116     History   Chief Complaint Chief Complaint  Patient presents with  . Foot Pain  . Ganglion Cyst    HPI [name] is a 81 y.o. male presenting via EMS from rehabilitation with worsening pain and necrosis to the fourth and fifth right toes noted by nurse who called EMS. Patient is dozing off to sleep during history taking. He reports shooting pain in his right foot. He states that he is here because the nurse looked at his wounds today and sent him to be evaluated due to progressing necrotic tissue. Denies fever, chills, nausea, vomiting or other symptoms. Patient is a M-W-F dialysis patient who missed session today.  HPI  Past Medical History:  Diagnosis Date  . Anginal pain (HCC)    before CABG  . Arthritis   . CHF (congestive heart failure) (HCC)   . Constipation   . Coronary artery disease   . Diabetes mellitus   . ESRD (end stage renal disease) (HCC)    TuesThurs Sat   . Hypertension   . Myocardial infarction (HCC)   . Pneumonia    2 years ago    Patient Active Problem List   Diagnosis Date Noted  . Sepsis due to cellulitis (HCC) 01/01/2017  . Hypertension 01/01/2017  . Coronary artery disease 01/01/2017  . CKD (chronic kidney disease) stage V requiring chronic dialysis (HCC) 01/01/2017  . Peripheral vascular disease of lower extremity with ulceration/RLE cellulitis (HCC) 01/01/2017  . Acute metabolic encephalopathy 01/01/2017  . Diabetes mellitus 01/01/2017  . Diabetes mellitus with complication (HCC)   . Hyperkalemia 12/26/2016  . Ulcer of right foot due to type 2 diabetes mellitus (HCC) 12/26/2016  . Cellulitis of right foot 12/26/2016  . Noncompliance of patient with renal dialysis (HCC) 12/26/2016  . End stage renal disease (HCC) 09/09/2013    Past Surgical History:  Procedure Laterality Date  . A/V FISTULAGRAM Left 10/31/2016   Procedure: A/V  Fistulagram;  Surgeon: Maeola Harmanain, Brandon Christopher, MD;  [facility];  Service: Cardiovascular;  Laterality: Left;  . ABDOMINAL AORTOGRAM W/LOWER EXTREMITY N/A 01/03/2017   Procedure: ABDOMINAL AORTOGRAM W/LOWER EXTREMITY;  Surgeon: Fransisco Hertzhen, Brian L, MD;  [facility];  Service: Cardiovascular;  Laterality: N/A;  . ARTERIOVENOUS GRAFT PLACEMENT Left   . BACK SURGERY    . CORONARY ARTERY BYPASS GRAFT    . LAPAROSCOPIC COLON RESECTION     growth  . REVISION OF ARTERIOVENOUS GORETEX GRAFT Left 11/16/2016   Procedure: REVISION OF ARTERIOVENOUS GORETEX GRAFT;  Surgeon: Chuck Hintickson, Christopher S, MD;  [facility];  Service: Vascular;  Laterality: Left;  . REVISON OF ARTERIOVENOUS FISTULA Left 09/11/2013   Procedure: REVISON OF ARTERIOVENOUS FISTULA-LEFT ARM; POSSIBLE VENOPLASTY WITH INTEROPERATIVE FISTULOGRAM;  Surgeon: Chuck Hinthristopher S Dickson, MD;  [facility];  Service: Vascular;  Laterality: Left;       Home Medications    Prior to Admission medications   Medication Sig Start Date End Date Taking? Authorizing Provider  acetaminophen (TYLENOL) 325 MG tablet Take 650 mg by mouth every 6 (six) hours as needed for mild pain.   Yes [provider]  allopurinol (ZYLOPRIM) 100 MG tablet Take 100 mg by mouth every evening.    Yes [provider]  aspirin EC 81 MG tablet Take 81 mg by mouth every evening.    Yes [provider]  multivitamin (RENA-VIT) TABS  tablet Take 1 tablet by mouth at bedtime. 01/07/17 02/06/17 Yes Arrien, York Ram, MD  Oxycodone HCl 10 MG TABS Take 10 mg by mouth every 4 (four) hours as needed (pain).   Yes [provider]  sucroferric oxyhydroxide (VELPHORO) 500 MG chewable tablet Chew 1,000 mg by mouth 3 (three) times daily with meals.    Yes [provider]  tuberculin 5 UNIT/0.1ML injection Inject 5 Units into the skin once. 2nd step on 01/26/17   Yes [provider]  vitamin B-12  (CYANOCOBALAMIN) 250 MCG tablet Take 250 mcg by mouth every evening.    Yes [provider]  diphenhydrAMINE (BENADRYL) 25 mg capsule Take 25 mg by mouth every 6 (six) hours as needed for allergies.    [provider]    Family History Family History  Problem Relation Age of Onset  . Cancer Father   . Diabetes Sister     Social History Social History  Substance Use Topics  . Smoking status: Never Smoker  . Smokeless tobacco: Current User    Types: Snuff  . Alcohol use No     Allergies   Iohexol; Zanaflex [tizanidine]; Ezetimibe-simvastatin; Fluvastatin; and Simvastatin   Review of Systems Review of Systems  Constitutional: Positive for fatigue. Negative for chills and fever.  HENT: Negative for congestion.   Eyes: Negative for visual disturbance.  Respiratory: Negative for shortness of breath and wheezing.   Cardiovascular: Negative for chest pain and palpitations.  Gastrointestinal: Negative for abdominal pain, nausea and vomiting.  Genitourinary: Negative for difficulty urinating.  Musculoskeletal: Positive for arthralgias and myalgias.  Skin: Positive for color change and wound.  Neurological: Negative for dizziness, syncope, weakness and light-headedness.     Physical Exam Updated Vital Signs BP 137/84   Pulse 87   Temp 98 F (36.7 C) (Oral)   Resp 11   Ht  (1.778 m)   Wt 87.1 kg (192 lb)   SpO2 97%   BMI 27.55 kg/m   Physical Exam  Constitutional: He is oriented to person, place, and time. He appears well-developed and well-nourished. No distress.  Afebrile, nontoxic-appearing, sleeping comfortably in bed in no acute distress.  HENT:  Head: Normocephalic and atraumatic.  Eyes: Conjunctivae are normal.  Neck: Neck supple.  Cardiovascular: Intact distal pulses.   No murmur heard. Dorsalis pedis pulses confirmed by Doppler bilaterally  Pulmonary/Chest: Effort normal. No respiratory distress.  Abdominal: Soft. There is no  tenderness.  Musculoskeletal: Normal range of motion. He exhibits edema and tenderness. He exhibits no deformity.  1+ pitting edema over the dorsum of the foot. Right fourth and fifth toe completely necrotic. Patient is still able to move first through third toes.  Proprioception intact to big toe bilaterally.  Neurological: He is alert and oriented to person, place, and time. No sensory deficit.  Skin: Skin is warm and dry. He is not diaphoretic.  Psychiatric: He has a normal mood and affect.  Nursing note and vitals reviewed.    ED Treatments / Results  Labs (all labs ordered are listed, but only abnormal results are displayed) Labs Reviewed  CBC WITH DIFFERENTIAL/PLATELET - Abnormal; Notable for the following:       Result Value   RBC 3.38 (*)    Hemoglobin 10.6 (*)    HCT 32.9 (*)    All other components within normal limits  COMPREHENSIVE METABOLIC PANEL - Abnormal; Notable for the following:    Sodium 134 (*)    Potassium 5.7 (*)  Chloride 91 (*)    BUN 34 (*)    Creatinine, Ser 8.25 (*)    Albumin 3.1 (*)    Alkaline Phosphatase 145 (*)    GFR calc non Af Amer 5 (*)    GFR calc Af Amer 6 (*)    All other components within normal limits  I-STAT CG4 LACTIC ACID, ED  I-STAT CG4 LACTIC ACID, ED    EKG  EKG Interpretation  Date/Time:    Ventricular Rate:  88 PR Interval:    QRS Duration: 98 QT Interval:  357 QTC Calculation: 432 R Axis:   22 Text Interpretation:  Sinus rhythm Prolonged PR interval Posterior infarct, old Nonspecific T abnormalities, lateral leads No significant change since last tracing Confirmed by Linwood Dibbles [phone]) on 01/25/2017 7:58:23 PM       Radiology Dg Foot Complete Left  Result Date: 01/25/2017 CLINICAL DATA:  Open wound, progressed over the past 3 months. EXAM: LEFT FOOT - COMPLETE 3+ VIEW COMPARISON:  None. FINDINGS: Osseous alignment is normal. Diffuse osteopenia limits characterization of osseous detail, however, there is no  fracture line or displaced fracture fragment seen. No destructive changes to suggest osteomyelitis. Extensive vascular calcifications are seen throughout the soft tissues of the left foot. Soft tissues about the left foot are otherwise unremarkable. No foreign body or soft tissue gas appreciated. IMPRESSION: 1. No acute findings. Diffuse osteopenia limits characterization of osseous detail but no fracture is seen and no obvious destructive change to suggest osteomyelitis. 2. Extensive atherosclerosis throughout the soft tissues of the left foot. Electronically Signed   By: Bary Richard M.D.   On: 01/25/2017 14:52   Dg Foot Complete Right  Result Date: 01/25/2017 CLINICAL DATA:  Open wound across toes of both feet, progressed over the past few months. EXAM: RIGHT FOOT COMPLETE - 3+ VIEW COMPARISON:  None. FINDINGS: Diffuse osteopenia which limits characterization of osseous detail but there is no fracture line or displaced fracture fragment identified. No destructive change to suggest osteomyelitis. On the lateral projection, there is a 7 mm linear foreign body within the superficial soft tissues underlying the midfoot, possibly chronic calcification. Also extensive vascular calcifications are seen throughout the soft tissues of the right foot. Superficial soft tissues are otherwise unremarkable. No soft tissue gas seen. IMPRESSION: 1. No acute appearing osseous abnormality, although diffuse osteopenia limits characterization of osseous detail. No fracture line or displaced fracture fragment seen. No obvious destructive change to suggest osteomyelitis. 2. Small linear 7 mm foreign body within the superficial soft tissues underlying the midfoot, of uncertain etiology but of calcific density and presumably related to the plantar fibroma described on earlier CT report of 12/19/2016. 3. Extensive atherosclerosis. Electronically Signed   By: Bary Richard M.D.   On: 01/25/2017 14:50    Procedures Procedures  (including critical care time)  Medications Ordered in ED Medications  calcitRIOL (ROCALTROL) capsule 0.25 mcg (not administered)  sucroferric oxyhydroxide (VELPHORO) chewable tablet 1,000 mg (not administered)  multivitamin (RENA-VIT) tablet 1 tablet (not administered)  sodium polystyrene (KAYEXALATE) 15 GM/60ML suspension 30 g (30 g Oral Given 01/25/17 1558)  fentaNYL (SUBLIMAZE) injection 50 mcg (50 mcg Intravenous Given 01/25/17 1814)     Initial Impression / Assessment and Plan / ED Course  I have reviewed the triage vital signs and the nursing notes.  Pertinent labs & imaging results that were available during my care of the patient were reviewed by me and considered in my medical decision making (see chart for details).  Patient presenting with worsening right extremity necrosis. Xray without evidence of osteomyelitis.  Patient seen by nephrology and patient refusing dialysis today. He received Kayexalate and will be getting dialysis tomorrow.  Vascular was consulted and patient was seen, awaiting recommendations from Dr. Darrick Penna. They will need to discuss amputation recommendations further with patient.  Consulted medicine for admission Spoke to Dr. Butler Denmark who declined admission.  Dr Butler Denmark spoke to Vascular and nephrology who agree that he will not be getting surgery over the weekend and can receive dialysis outpatient tomorrow. Patient is still contemplating whether he will agree to amputation. Patient can be discharged to nursing facility with close follow up with vascular and dialysis scheduled for tomorrow outpatient. No need for antibiotics.  Patient agrees with said plan. He is stable with normal vital signs prior to discharge.  Discussed strict return precautions and advised to return to the emergency department if experiencing any new or worsening symptoms. Instructions were understood and patient agreed with discharge plan. Final Clinical Impressions(s) / ED  Diagnoses   Final diagnoses:  Right foot pain  Tissue necrosis with gangrene in peripheral vascular disease [facility])    New Prescriptions New Prescriptions   No medications on file     Gregary Cromer 01/25/17 Andres Shad, MD 01/28/17 [phone]

## 2017-01-25 NOTE — Progress Notes (Addendum)
[facility]  I have examined the patient and reviewed the chart. I have spoken with Dr Darrick PennaFields who states that the patient will not be getting surgery this weekend even if he is to agree to it (he is still thinking about it). There is no infection that needs IV antibiotics at this time. I have spoken with Dr Hyman HopesWebb who does not feel he needs to be admitted for dialysis and will set him up for oupt dialysis tomorrow. He can therefore be discharged from the ER. I have explained this to the patient and he agrees to be discharged which the ER will take care of.   Ian CantorSaima Icy Fuhrmann, MD

## 2017-01-25 NOTE — ED Notes (Signed)
[place] initiated bc pt desats to 80's while asleep. States he sometimes uses oxygen at rest'

## 2017-01-25 NOTE — ED Notes (Signed)
Pt offered meal. He declines.

## 2017-01-25 NOTE — ED Notes (Signed)
Communication has been made with wife Tyler AasDoris about patients discharge.

## 2017-01-25 NOTE — ED Notes (Signed)
ED Provider at bedside. 

## 2017-01-25 NOTE — ED Notes (Signed)
IV attempted x2 will consult IV TEAM

## 2017-01-25 NOTE — Discharge Instructions (Signed)
As discussed, follow up with Dr. Darrick PennaFields Vascular surgery in office to discuss management of lower extremities severe vascular disease and necrosis.  Follow up for dialysis tomorrow as an outpatient.  Keep the area clean, warm and with dressing. Return if symptoms worsen or you experience any new concerning symptoms in the meantime.

## 2017-01-25 NOTE — ED Notes (Signed)
Pt returned from X-ray.  

## 2017-01-25 NOTE — ED Notes (Signed)
Nephrology at bedside with pt discussing Dialysis treatment today. Pt refused. Pt offered comfort measures including pain control. Pt continues to refuse.

## 2017-01-25 NOTE — ED Notes (Signed)
Pt in X ray

## 2017-01-25 NOTE — ED Notes (Signed)
Vascular MD at bedside for assessment

## 2017-01-25 NOTE — ED Notes (Signed)
Vascular at bedside

## 2017-01-25 NOTE — Consult Note (Deleted)
[facility] Renal Consultation Note    Indication for Consultation:  Management of ESRD/hemodialysis; anemia, hypertension/volume and secondary hyperparathyroidism PCP: Dr. Michel Santee Eyk  HPI: [name] is a 81 y.o. male with ESRD on hemodialysis MWF at [facility]. PMH significant for DM, HTN, gout, partial transverse colectomy 2009, MI, anginal pain, PNA, CHF. He presents to ED from SNF with C/O pain in feet, blackened toes. He missed HD this AM. This is 3rd visit since August for same complaint-recently 08/14- 01/07/2017. 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 He was to follow up with [facility] on outpatient basis, was DC'd home on Vancomycin, levofloxacin and wound care to SNF [facility]). Last hemodialysis 01/23/17.   He is being seen in ED. He is oriented X 2. He C/O pain in both feet and is refusing dialysis due to pain in LE. He now has blackened toes 3rd-5th toes R foot, erythema across base of all toes on L foot with denuded areas on top of 2nd-4th toes. There is dried exudate between toes on both feet. He has 2+ bilateral LE edema. Temp-98.0 BP 156/69 HR 104 RR 16 O2 sat 94% on RA. Lactic acid 1.51 WBC 7.8 HGB 10.6 HCT 32.9 PLT 276 Na 134 K+ 5.7 Co2 29 Scr 8.25 BUN 34 BS 74. [facility] has been notified of patient's arrival per ED physician. Attempted to order hemodialysis but he is steadfastly refusing. Says his feet hurt too badly for hemodialysis, that he will go Monday. Told that he couldn't wait that long and he agrees to have kayexalate tonight and have HD 1st thing in AM. We offered to give IV pain medicine and then do HD but he is still refusing. He has no C/O chest pain, fever, chills, N,V,D, abdominal pain, flank pain, now with mild headache, no dizziness, vision or hearing changes. No  falls. Patient is poor historian and has signs of dementia-thinks the month is February, cannot name president. He does know he is at [facility] and that today is Friday.     Past Medical History:  Diagnosis Date  . Anginal pain (HCC)    before CABG  . Arthritis   . CHF (congestive heart failure) (HCC)   . Constipation   . Coronary artery disease   . Diabetes mellitus   . ESRD (end stage renal disease) (HCC)    TuesThurs Sat [place]  . Hypertension   . Myocardial infarction (HCC)   . Pneumonia    2 years ago   Past Surgical History:  Procedure Laterality Date  . A/V FISTULAGRAM Left 10/31/2016   Procedure: A/V Fistulagram;  Surgeon: Maeola Harman, MD;  Location: [facility] INVASIVE CV LAB;  Service: Cardiovascular;  Laterality: Left;  . ABDOMINAL AORTOGRAM W/LOWER EXTREMITY N/A 01/03/2017   Procedure: ABDOMINAL AORTOGRAM W/LOWER EXTREMITY;  Surgeon: Fransisco Hertz, MD;  Location: [facility] INVASIVE CV LAB;  Service: Cardiovascular;  Laterality: N/A;  . ARTERIOVENOUS GRAFT PLACEMENT Left   . BACK SURGERY    . CORONARY ARTERY BYPASS GRAFT    . LAPAROSCOPIC COLON RESECTION     growth  . REVISION OF ARTERIOVENOUS GORETEX GRAFT Left 11/16/2016   Procedure: REVISION OF ARTERIOVENOUS GORETEX GRAFT;  Surgeon: Chuck Hint, MD;  Location: [facility] OR;  Service: Vascular;  Laterality: Left;  . REVISON OF ARTERIOVENOUS FISTULA Left 09/11/2013   Procedure:  REVISON OF ARTERIOVENOUS FISTULA-LEFT ARM; POSSIBLE VENOPLASTY WITH INTEROPERATIVE FISTULOGRAM;  Surgeon: Chuck Hint, MD;  Location: [facility] OR;  Service: Vascular;  Laterality: Left;   Family History  Problem Relation Age of Onset  . Cancer Father   . Diabetes Sister    Social History:  reports that he has never smoked. His smokeless tobacco use includes Snuff. He reports that he does not drink alcohol or use drugs. Allergies  Allergen Reactions  . Iohexol Swelling and Rash  . Zanaflex [Tizanidine] Other (See Comments)    Shut  kidneys down Unable to walk or void  . Ezetimibe-Simvastatin Other (See Comments)    Elevated liver enzymes  . Fluvastatin Other (See Comments)  . Simvastatin Other (See Comments)    Elevated liver enzymes   Prior to Admission medications   Medication Sig Start Date End Date Taking? Authorizing Provider  acetaminophen (TYLENOL) 325 MG tablet Take 650 mg by mouth every 6 (six) hours as needed for mild pain.   Yes [provider]  allopurinol (ZYLOPRIM) 100 MG tablet Take 100 mg by mouth every evening.    Yes [provider]  aspirin EC 81 MG tablet Take 81 mg by mouth every evening.    Yes [provider]  multivitamin (RENA-VIT) TABS tablet Take 1 tablet by mouth at bedtime. 01/07/17 02/06/17 Yes Arrien, York Ram, MD  Oxycodone HCl 10 MG TABS Take 10 mg by mouth every 4 (four) hours as needed (pain).   Yes [provider]  sucroferric oxyhydroxide (VELPHORO) 500 MG chewable tablet Chew 1,000 mg by mouth 3 (three) times daily with meals.    Yes [provider]  tuberculin 5 UNIT/0.1ML injection Inject 5 Units into the skin once. 2nd step on 01/26/17   Yes [provider]  vitamin B-12 (CYANOCOBALAMIN) 250 MCG tablet Take 250 mcg by mouth every evening.    Yes [provider]  diphenhydrAMINE (BENADRYL) 25 mg capsule Take 25 mg by mouth every 6 (six) hours as needed for allergies.    [provider]   Current Facility-Administered Medications  Medication Dose Route Frequency Provider Last Rate Last Dose  . sodium polystyrene (KAYEXALATE) 15 GM/60ML suspension 30 g  30 g Oral Once Bethann Berkshire, MD       Current Outpatient Prescriptions  Medication Sig Dispense Refill  . acetaminophen (TYLENOL) 325 MG tablet Take 650 mg by mouth every 6 (six) hours as needed for mild pain.    Marland Kitchen allopurinol (ZYLOPRIM) 100 MG tablet Take 100 mg by mouth every evening.     Marland Kitchen aspirin EC 81 MG tablet Take 81 mg by mouth every evening.      . multivitamin (RENA-VIT) TABS tablet Take 1 tablet by mouth at bedtime. 30 tablet 0  . Oxycodone HCl 10 MG TABS Take 10 mg by mouth every 4 (four) hours as needed (pain).    . sucroferric oxyhydroxide (VELPHORO) 500 MG chewable tablet Chew 1,000 mg by mouth 3 (three) times daily with meals.     Marland Kitchen tuberculin 5 UNIT/0.1ML injection Inject 5 Units into the skin once. 2nd step on 01/26/17    . vitamin B-12 (CYANOCOBALAMIN) 250 MCG tablet Take 250 mcg by mouth every evening.     . diphenhydrAMINE (BENADRYL) 25 mg capsule Take 25 mg by mouth every 6 (six) hours as needed for allergies.     Labs: Basic Metabolic Panel:  Recent Labs Lab 01/25/17 1355  NA 134*  K 5.7*  CL 91*  CO2  29  GLUCOSE 74  BUN 34*  CREATININE 8.25*  CALCIUM 9.7   Liver Function Tests:  Recent Labs Lab 01/25/17 1355  AST 30  ALT 31  ALKPHOS 145*  BILITOT 0.5  PROT 7.2  ALBUMIN 3.1*   No results for input(s): LIPASE, AMYLASE in the last 168 hours. No results for input(s): AMMONIA in the last 168 hours. CBC:  Recent Labs Lab 01/25/17 1355  WBC 7.8  NEUTROABS 5.7  HGB 10.6*  HCT 32.9*  MCV 97.3  PLT 276   Cardiac Enzymes: No results for input(s): CKTOTAL, CKMB, CKMBINDEX, TROPONINI in the last 168 hours. CBG: No results for input(s): GLUCAP in the last 168 hours. Iron Studies: No results for input(s): IRON, TIBC, TRANSFERRIN, FERRITIN in the last 72 hours. Studies/Results: Dg Foot Complete Left  Result Date: 01/25/2017 CLINICAL DATA:  Open wound, progressed over the past 3 months. EXAM: LEFT FOOT - COMPLETE 3+ VIEW COMPARISON:  None. FINDINGS: Osseous alignment is normal. Diffuse osteopenia limits characterization of osseous detail, however, there is no fracture line or displaced fracture fragment seen. No destructive changes to suggest osteomyelitis. Extensive vascular calcifications are seen throughout the soft tissues of the left foot. Soft tissues about the left foot are otherwise unremarkable.  No foreign body or soft tissue gas appreciated. IMPRESSION: 1. No acute findings. Diffuse osteopenia limits characterization of osseous detail but no fracture is seen and no obvious destructive change to suggest osteomyelitis. 2. Extensive atherosclerosis throughout the soft tissues of the left foot. Electronically Signed   By: Bary Richard M.D.   On: 01/25/2017 14:52   Dg Foot Complete Right  Result Date: 01/25/2017 CLINICAL DATA:  Open wound across toes of both feet, progressed over the past few months. EXAM: RIGHT FOOT COMPLETE - 3+ VIEW COMPARISON:  None. FINDINGS: Diffuse osteopenia which limits characterization of osseous detail but there is no fracture line or displaced fracture fragment identified. No destructive change to suggest osteomyelitis. On the lateral projection, there is a 7 mm linear foreign body within the superficial soft tissues underlying the midfoot, possibly chronic calcification. Also extensive vascular calcifications are seen throughout the soft tissues of the right foot. Superficial soft tissues are otherwise unremarkable. No soft tissue gas seen. IMPRESSION: 1. No acute appearing osseous abnormality, although diffuse osteopenia limits characterization of osseous detail. No fracture line or displaced fracture fragment seen. No obvious destructive change to suggest osteomyelitis. 2. Small linear 7 mm foreign body within the superficial soft tissues underlying the midfoot, of uncertain etiology but of calcific density and presumably related to the plantar fibroma described on earlier CT report of 12/19/2016. 3. Extensive atherosclerosis. Electronically Signed   By: Bary Richard M.D.   On: 01/25/2017 14:50    ROS: As per HPI otherwise negative.  Physical Exam: Vitals:   01/25/17 1120 01/25/17 1124 01/25/17 1126 01/25/17 1300  BP:  (!) 156/69  (!) 157/59  Pulse:  (!) 104  86  Resp:  16  16  Temp:  98 F (36.7 C)    TempSrc:  Oral    SpO2: 94% 94%  94%  Weight:   87.1 kg  (192 lb)   Height:    (1.778 m)      General: Well developed, well nourished, in no acute distress. Head: Normocephalic, atraumatic, sclera non-icteric, mucus membranes are moist Neck: Supple. JVD not elevated. Lungs: Bilateral breath sounds with few bibasilar crackles. No WOB.  Heart: RRR with S1 S2. No murmurs, rubs, or gallops appreciated. Abdomen:  Soft, non-tender, non-distended with normoactive bowel sounds. No rebound/guarding. No obvious abdominal masses. M-S:  Strength and tone appear normal for age. Lower extremities: Bilateral 2+ pitting edema LE. Blackened toes with dried exudate between toes 3-5th toes R foot, erythema base of toes on L foot, denuded areas top of  2nd-4th toes L foot.  Neuro: Alert and oriented X 2. Moves all extremities spontaneously. Psych:  Responds to questions appropriately with a normal affect for patient. Dialysis Access: LFA AVG aneurysmal, + bruit  Dialysis Orders: [place] T,Th,S 4 hrs 15min 180 NRe 450/Auto 1.5 2.0 K/ 2.0 Ca 107 kg UF profile 2 LFA AVG -Heparin 7500 units IV TIW -Calcitriol 0.25 mcg PO TIW -Mircera 60 mcg IV q 2 weeks (Last dose 01/23/17 Last HGB 10.6 ) -Parsabiv 5 mg IV TIW   Assessment/Plan: 1.  Pain/blackened toes R Foot/Cellulitis L Foot: per primary. [facility] to see pt.  2.  Mild hyperkalemia: K+ 5.7. Patient refusing dialysis. Asked ED provider to give Kayexalate 30 grams PO tonight, pt agrees to have hemodialysis in AM. Recheck labs prior to HD in AM.   3.  ESRD -  MWF. Refusing HD today. Will have HD off schedule in AM. 2.0 K bath. Regular dose heparin.  4.  Hypertension/volume  - No antihypertensive meds on OP med list. BP elevated but patient is in pain. Has 2+ BLE. Attempt 4 liters UFG tomorrow.  5.  Anemia  - HGB 10.6. Recent ESA dose. Follow HGB.  6.  Metabolic bone disease - On parsibiv in HD center-[facility]. Cont binders, VDRA. Ca 9.7 7.  Nutrition - Albumin 3.1. Renal/Carb mod diet when able to eat,  renal vit, prostat.  8. DM-per primary 9. H/O chronic back pain- per primary  Akia Montalban H. Manson PasseyBrown, NP-C 01/25/2017, 3:10 PM  [facility] Beeper [phone]

## 2017-01-25 NOTE — ED Notes (Signed)
Lab at bedside. Pt request pain meds

## 2017-01-25 NOTE — Procedures (Deleted)
Tracheostomy Change Note  Patient Details:   Name: [name] DOB: 02/28/1936 MRN: [id number]    Airway Documentation:  Patient with #6.0 cuffless, needed to be changed to 6.0 SH, for airway management, sp02 79%.   Evaluation  O2 sats: stable throughout and and now remains on ventilator. Complications: No apparent complications Patient did tolerate procedure well.      Newt LukesGroendal, Yue Flanigan Ann 01/25/2017, 12:28 PM

## 2017-01-26 DIAGNOSIS — D631 Anemia in chronic kidney disease: Secondary | ICD-10-CM | POA: Diagnosis not present

## 2017-01-26 DIAGNOSIS — N2581 Secondary hyperparathyroidism of renal origin: Secondary | ICD-10-CM | POA: Diagnosis not present

## 2017-01-26 DIAGNOSIS — R6883 Chills (without fever): Secondary | ICD-10-CM | POA: Diagnosis not present

## 2017-01-26 DIAGNOSIS — N186 End stage renal disease: Secondary | ICD-10-CM | POA: Diagnosis not present

## 2017-01-26 DIAGNOSIS — R52 Pain, unspecified: Secondary | ICD-10-CM | POA: Diagnosis not present

## 2017-01-26 DIAGNOSIS — D689 Coagulation defect, unspecified: Secondary | ICD-10-CM | POA: Diagnosis not present

## 2017-01-26 DIAGNOSIS — E1129 Type 2 diabetes mellitus with other diabetic kidney complication: Secondary | ICD-10-CM | POA: Diagnosis not present

## 2017-01-28 ENCOUNTER — Encounter: Payer: Self-pay | Admitting: Vascular Surgery

## 2017-01-28 DIAGNOSIS — N186 End stage renal disease: Secondary | ICD-10-CM | POA: Diagnosis not present

## 2017-01-28 DIAGNOSIS — L03115 Cellulitis of right lower limb: Secondary | ICD-10-CM | POA: Diagnosis not present

## 2017-01-28 DIAGNOSIS — N2581 Secondary hyperparathyroidism of renal origin: Secondary | ICD-10-CM | POA: Diagnosis not present

## 2017-01-28 DIAGNOSIS — R6883 Chills (without fever): Secondary | ICD-10-CM | POA: Diagnosis not present

## 2017-01-28 DIAGNOSIS — E119 Type 2 diabetes mellitus without complications: Secondary | ICD-10-CM | POA: Diagnosis not present

## 2017-01-28 DIAGNOSIS — D631 Anemia in chronic kidney disease: Secondary | ICD-10-CM | POA: Diagnosis not present

## 2017-01-28 DIAGNOSIS — E1129 Type 2 diabetes mellitus with other diabetic kidney complication: Secondary | ICD-10-CM | POA: Diagnosis not present

## 2017-01-28 DIAGNOSIS — R52 Pain, unspecified: Secondary | ICD-10-CM | POA: Diagnosis not present

## 2017-01-28 DIAGNOSIS — I10 Essential (primary) hypertension: Secondary | ICD-10-CM | POA: Diagnosis not present

## 2017-01-28 DIAGNOSIS — D689 Coagulation defect, unspecified: Secondary | ICD-10-CM | POA: Diagnosis not present

## 2017-01-28 DIAGNOSIS — M150 Primary generalized (osteo)arthritis: Secondary | ICD-10-CM | POA: Diagnosis not present

## 2017-01-30 ENCOUNTER — Other Ambulatory Visit: Payer: Self-pay

## 2017-01-30 ENCOUNTER — Ambulatory Visit: Payer: Medicare Other | Admitting: Vascular Surgery

## 2017-01-30 ENCOUNTER — Encounter: Payer: Self-pay | Admitting: Vascular Surgery

## 2017-01-30 ENCOUNTER — Ambulatory Visit (INDEPENDENT_AMBULATORY_CARE_PROVIDER_SITE_OTHER): Payer: Medicare Other | Admitting: Vascular Surgery

## 2017-01-30 VITALS — BP 132/60 | HR 80 | Temp 97.6°F | Resp 20 | Ht 70.0 in | Wt 192.0 lb

## 2017-01-30 DIAGNOSIS — Z992 Dependence on renal dialysis: Secondary | ICD-10-CM | POA: Diagnosis not present

## 2017-01-30 DIAGNOSIS — D631 Anemia in chronic kidney disease: Secondary | ICD-10-CM | POA: Diagnosis not present

## 2017-01-30 DIAGNOSIS — N2581 Secondary hyperparathyroidism of renal origin: Secondary | ICD-10-CM | POA: Diagnosis not present

## 2017-01-30 DIAGNOSIS — N186 End stage renal disease: Secondary | ICD-10-CM

## 2017-01-30 DIAGNOSIS — I739 Peripheral vascular disease, unspecified: Secondary | ICD-10-CM | POA: Diagnosis not present

## 2017-01-30 DIAGNOSIS — D689 Coagulation defect, unspecified: Secondary | ICD-10-CM | POA: Diagnosis not present

## 2017-01-30 DIAGNOSIS — E1129 Type 2 diabetes mellitus with other diabetic kidney complication: Secondary | ICD-10-CM | POA: Diagnosis not present

## 2017-01-30 DIAGNOSIS — R52 Pain, unspecified: Secondary | ICD-10-CM | POA: Diagnosis not present

## 2017-01-30 DIAGNOSIS — R6883 Chills (without fever): Secondary | ICD-10-CM | POA: Diagnosis not present

## 2017-01-30 NOTE — Progress Notes (Signed)
Patient ID: Ian BlakeWarren J Turner, male   DOB: 11/22/1935, 81 y.o.   MRN: [id number]  Reason for Consult: PVD (2-3 wk wound check )   Referred by Crist FatVan Eyk, Jason, MD  Subjective:     HPI:  [name] is a 81 y.o. male with end-stage renal disease who was initially seen in the hospital with right foot toe ulcerations that were mostly just darkened discoloration. He underwent angiogram and had severe tibial disease bilaterally. He now presents with progressive gangrene of his toes. He is staying in a rehabilitation facility and is not with any family today. He has not been able to walk given ulceration of his toes and previously did walk. He is having considerable pain with his toe ulcerations. He denies any fevers.  Past Medical History:  Diagnosis Date  . Anginal pain (HCC)    before CABG  . Arthritis   . CHF (congestive heart failure) (HCC)   . Constipation   . Coronary artery disease   . Diabetes mellitus   . ESRD (end stage renal disease) (HCC)    TuesThurs Sat [place]  . Hypertension   . Myocardial infarction (HCC)   . Pneumonia    2 years ago   Family History  Problem Relation Age of Onset  . Cancer Father   . Diabetes Sister    Past Surgical History:  Procedure Laterality Date  . A/V FISTULAGRAM Left 10/31/2016   Procedure: A/V Fistulagram;  Surgeon: Maeola Harmanain, Makhiya Coburn Christopher, MD;  [facility];  Service: Cardiovascular;  Laterality: Left;  . ABDOMINAL AORTOGRAM W/LOWER EXTREMITY N/A 01/03/2017   Procedure: ABDOMINAL AORTOGRAM W/LOWER EXTREMITY;  Surgeon: Fransisco Hertzhen, Brian L, MD;  [facility];  Service: Cardiovascular;  Laterality: N/A;  . ARTERIOVENOUS GRAFT PLACEMENT Left   . BACK SURGERY    . CORONARY ARTERY BYPASS GRAFT    . LAPAROSCOPIC COLON RESECTION     growth  . REVISION OF ARTERIOVENOUS GORETEX GRAFT Left 11/16/2016   Procedure: REVISION OF ARTERIOVENOUS GORETEX GRAFT;  Surgeon: Chuck Hintickson, Christopher S, MD;  [facility];  Service:  Vascular;  Laterality: Left;  . REVISON OF ARTERIOVENOUS FISTULA Left 09/11/2013   Procedure: REVISON OF ARTERIOVENOUS FISTULA-LEFT ARM; POSSIBLE VENOPLASTY WITH INTEROPERATIVE FISTULOGRAM;  Surgeon: Chuck Hinthristopher S Dickson, MD;  [facility];  Service: Vascular;  Laterality: Left;    Short Social History:  Social History  Substance Use Topics  . Smoking status: Never Smoker  . Smokeless tobacco: Current User    Types: Snuff  . Alcohol use No    Allergies  Allergen Reactions  . Iohexol Swelling and Rash  . Zanaflex [Tizanidine] Other (See Comments)    Shut kidneys down Unable to walk or void  . Ezetimibe-Simvastatin Other (See Comments)    Elevated liver enzymes  . Fluvastatin Other (See Comments)  . Simvastatin Other (See Comments)    Elevated liver enzymes    Current Outpatient Prescriptions  Medication Sig Dispense Refill  . acetaminophen (TYLENOL) 325 MG tablet Take 650 mg by mouth every 6 (six) hours as needed for mild pain.    Marland Kitchen. allopurinol (ZYLOPRIM) 100 MG tablet Take 100 mg by mouth every evening.     Marland Kitchen. aspirin EC 81 MG tablet Take 81 mg by mouth every evening.     . diphenhydrAMINE (BENADRYL) 25 mg capsule Take 25 mg by mouth every 6 (six) hours as needed for allergies.    . multivitamin (RENA-VIT) TABS tablet Take 1 tablet by  mouth at bedtime. 30 tablet 0  . Oxycodone HCl 10 MG TABS Take 10 mg by mouth every 4 (four) hours as needed (pain).    . sucroferric oxyhydroxide (VELPHORO) 500 MG chewable tablet Chew 1,000 mg by mouth 3 (three) times daily with meals.     Marland Kitchen tuberculin 5 UNIT/0.1ML injection Inject 5 Units into the skin once. 2nd step on 01/26/17    . vitamin B-12 (CYANOCOBALAMIN) 250 MCG tablet Take 250 mcg by mouth every evening.      No current facility-administered medications for this visit.     Review of Systems  Constitutional:  Constitutional negative. HENT: HENT negative.  Eyes: Eyes negative.  Respiratory: Respiratory negative.  Cardiovascular:  Cardiovascular negative.  GI: Gastrointestinal negative.  Musculoskeletal: Positive for back pain.  Skin: Positive for wound.  Neurological: Neurological negative. Hematologic: Hematologic/lymphatic negative.  Psychiatric: Psychiatric negative.        Objective:  Objective   Vitals:   01/30/17 0854  BP: 132/60  Pulse: 80  Resp: 20  Temp: 97.6 F (36.4 C)  TempSrc: Oral  SpO2: 100%  Weight: 192 lb (87.1 kg)  Height:  (1.778 m)   Body mass index is 27.55 kg/m.  Physical Exam  Constitutional: He is oriented to person, place, and time. He appears well-developed.  HENT:  Head: Normocephalic.  Cardiovascular: Normal rate.   Pulmonary/Chest: Effort normal.  Abdominal: Soft. He exhibits no mass.  Musculoskeletal: Normal range of motion. He exhibits edema.  Neurological: He is alert and oriented to person, place, and time.  Skin:  Dry gangrene of right toes 3-5 Superficial ulceration of left 2nd toe  Psychiatric: He has a normal mood and affect. His behavior is normal. Judgment and thought content normal.    Data:      Assessment/Plan:     81yo AAM with dry gangrene of right toes and severe tibial disease. No antegrade options for reconstruction. Discussed with him need for either primary amputation or retrograde access from right dp. Wants to do everything to keep his right foot understanding that he will likely require amputation of at least several toes on the right at some point. Also might require similar procedure on the left although would need more information from distal tibial arteries prior to planning retrograde procedure.We have discussed the risk benefits and alternatives and he agrees to proceed.      Maeola Harman MD [facility]

## 2017-02-04 DIAGNOSIS — N186 End stage renal disease: Secondary | ICD-10-CM | POA: Diagnosis not present

## 2017-02-04 DIAGNOSIS — R52 Pain, unspecified: Secondary | ICD-10-CM | POA: Diagnosis not present

## 2017-02-04 DIAGNOSIS — R6883 Chills (without fever): Secondary | ICD-10-CM | POA: Diagnosis not present

## 2017-02-04 DIAGNOSIS — N2581 Secondary hyperparathyroidism of renal origin: Secondary | ICD-10-CM | POA: Diagnosis not present

## 2017-02-04 DIAGNOSIS — D631 Anemia in chronic kidney disease: Secondary | ICD-10-CM | POA: Diagnosis not present

## 2017-02-04 DIAGNOSIS — D689 Coagulation defect, unspecified: Secondary | ICD-10-CM | POA: Diagnosis not present

## 2017-02-04 DIAGNOSIS — E1129 Type 2 diabetes mellitus with other diabetic kidney complication: Secondary | ICD-10-CM | POA: Diagnosis not present

## 2017-02-05 DIAGNOSIS — R600 Localized edema: Secondary | ICD-10-CM | POA: Diagnosis not present

## 2017-02-05 DIAGNOSIS — G894 Chronic pain syndrome: Secondary | ICD-10-CM | POA: Diagnosis not present

## 2017-02-05 DIAGNOSIS — N186 End stage renal disease: Secondary | ICD-10-CM | POA: Diagnosis not present

## 2017-02-05 DIAGNOSIS — E1152 Type 2 diabetes mellitus with diabetic peripheral angiopathy with gangrene: Secondary | ICD-10-CM | POA: Diagnosis not present

## 2017-02-06 ENCOUNTER — Encounter (HOSPITAL_COMMUNITY)
Admission: RE | Disposition: A | Discharge status: Home / Self Care | Payer: Self-pay | Source: Ambulatory Visit | Attending: Vascular Surgery

## 2017-02-06 ENCOUNTER — Ambulatory Visit (HOSPITAL_COMMUNITY)
Admission: RE | Admit: 2017-02-06 | Discharge: 2017-02-06 | Disposition: A | Discharge status: Home / Self Care | Payer: Medicare Other | Source: Ambulatory Visit | Attending: Vascular Surgery | Admitting: Vascular Surgery

## 2017-02-06 DIAGNOSIS — E1152 Type 2 diabetes mellitus with diabetic peripheral angiopathy with gangrene: Secondary | ICD-10-CM | POA: Insufficient documentation

## 2017-02-06 DIAGNOSIS — E1122 Type 2 diabetes mellitus with diabetic chronic kidney disease: Secondary | ICD-10-CM | POA: Diagnosis not present

## 2017-02-06 DIAGNOSIS — I998 Other disorder of circulatory system: Secondary | ICD-10-CM | POA: Diagnosis not present

## 2017-02-06 DIAGNOSIS — N186 End stage renal disease: Secondary | ICD-10-CM | POA: Insufficient documentation

## 2017-02-06 DIAGNOSIS — I70261 Atherosclerosis of native arteries of extremities with gangrene, right leg: Secondary | ICD-10-CM | POA: Diagnosis not present

## 2017-02-06 HISTORY — PX: PERIPHERAL VASCULAR BALLOON ANGIOPLASTY: CATH118281

## 2017-02-06 HISTORY — PX: ABDOMINAL AORTOGRAM W/LOWER EXTREMITY: CATH118223

## 2017-02-06 LAB — POCT I-STAT, CHEM 8
BUN: 43 mg/dL — ABNORMAL HIGH (ref 6–20)
CALCIUM ION: 1.04 mmol/L — AB (ref 1.15–1.40)
Chloride: 95 mmol/L — ABNORMAL LOW (ref 101–111)
Creatinine, Ser: 11.3 mg/dL — ABNORMAL HIGH (ref 0.61–1.24)
Glucose, Bld: 88 mg/dL (ref 65–99)
HCT: 32 % — ABNORMAL LOW (ref 39.0–52.0)
Hemoglobin: 10.9 g/dL — ABNORMAL LOW (ref 13.0–17.0)
Potassium: 4.8 mmol/L (ref 3.5–5.1)
SODIUM: 134 mmol/L — AB (ref 135–145)
TCO2: 32 mmol/L (ref 22–32)

## 2017-02-06 LAB — GLUCOSE, CAPILLARY: Glucose-Capillary: 102 mg/dL — ABNORMAL HIGH (ref 65–99)

## 2017-02-06 SURGERY — ABDOMINAL AORTOGRAM W/LOWER EXTREMITY
Anesthesia: LOCAL | Laterality: Right

## 2017-02-06 MED ORDER — SODIUM CHLORIDE 0.9% FLUSH: 3.0000 mL | INTRAVENOUS | Status: DC | PRN

## 2017-02-06 MED ORDER — IODIXANOL 320 MG/ML IV SOLN
INTRAVENOUS | Status: DC | PRN
  Administered 2017-02-06: 20 mL via INTRA_ARTERIAL

## 2017-02-06 MED ORDER — LABETALOL HCL 5 MG/ML IV SOLN: 10.0000 mg | INTRAVENOUS | Status: DC | PRN

## 2017-02-06 MED ORDER — DIPHENHYDRAMINE HCL 50 MG/ML IJ SOLN
25.0000 mg | INTRAMUSCULAR | Status: AC
  Administered 2017-02-06: 25 mg via INTRAVENOUS

## 2017-02-06 MED ORDER — DIPHENHYDRAMINE HCL 50 MG/ML IJ SOLN
INTRAMUSCULAR | Status: AC
  Administered 2017-02-06: 25 mg via INTRAVENOUS
  Filled 2017-02-06: qty 1

## 2017-02-06 MED ORDER — HEPARIN (PORCINE) IN NACL 2-0.9 UNIT/ML-% IJ SOLN
INTRAMUSCULAR | Status: AC
  Filled 2017-02-06: qty 1000

## 2017-02-06 MED ORDER — SODIUM CHLORIDE 0.9% FLUSH: 3.0000 mL | Freq: Two times a day (BID) | INTRAVENOUS | Status: DC

## 2017-02-06 MED ORDER — HYDRALAZINE HCL 20 MG/ML IJ SOLN: 5.0000 mg | INTRAMUSCULAR | Status: DC | PRN

## 2017-02-06 MED ORDER — OXYCODONE HCL 5 MG PO TABS: 5.0000 mg | ORAL_TABLET | ORAL | Status: DC | PRN

## 2017-02-06 MED ORDER — METHYLPREDNISOLONE SODIUM SUCC 125 MG IJ SOLR
INTRAMUSCULAR | Status: AC
  Administered 2017-02-06: 125 mg via INTRAVENOUS
  Filled 2017-02-06: qty 2

## 2017-02-06 MED ORDER — HEPARIN (PORCINE) IN NACL 2-0.9 UNIT/ML-% IJ SOLN
INTRAMUSCULAR | Status: AC | PRN
  Administered 2017-02-06: 500 mL via INTRA_ARTERIAL

## 2017-02-06 MED ORDER — HEPARIN SODIUM (PORCINE) 1000 UNIT/ML IJ SOLN
INTRAMUSCULAR | Status: DC | PRN
  Administered 2017-02-06: 4000 [IU] via INTRAVENOUS

## 2017-02-06 MED ORDER — SODIUM CHLORIDE 0.9 % IV SOLN: 250.0000 mL | INTRAVENOUS | Status: DC | PRN

## 2017-02-06 MED ORDER — FAMOTIDINE IN NACL 20-0.9 MG/50ML-% IV SOLN
INTRAVENOUS | Status: AC
  Administered 2017-02-06: 20 mg via INTRAVENOUS
  Filled 2017-02-06: qty 50

## 2017-02-06 MED ORDER — LIDOCAINE HCL (PF) 1 % IJ SOLN
INTRAMUSCULAR | Status: DC | PRN
  Administered 2017-02-06: 10 mL

## 2017-02-06 MED ORDER — LIDOCAINE HCL 2 % IJ SOLN
INTRAMUSCULAR | Status: AC
  Filled 2017-02-06: qty 10

## 2017-02-06 MED ORDER — HEPARIN SODIUM (PORCINE) 1000 UNIT/ML IJ SOLN
INTRAMUSCULAR | Status: AC
  Filled 2017-02-06: qty 1

## 2017-02-06 MED ORDER — METHYLPREDNISOLONE SODIUM SUCC 125 MG IJ SOLR
125.0000 mg | INTRAMUSCULAR | Status: AC
  Administered 2017-02-06: 125 mg via INTRAVENOUS

## 2017-02-06 MED ORDER — FAMOTIDINE IN NACL 20-0.9 MG/50ML-% IV SOLN
20.0000 mg | INTRAVENOUS | Status: AC
  Administered 2017-02-06: 20 mg via INTRAVENOUS

## 2017-02-06 SURGICAL SUPPLY — 12 items
CATH CXI 2.6F 65 ST (CATHETERS) ×6
CATH QUICKCROSS SUPP .018X90CM (MICROCATHETER) ×1 IMPLANT
CATH SPRT STRG 65X2.6FR BRD (CATHETERS) IMPLANT
COVER PRB 48X5XTLSCP FOLD TPE (BAG) IMPLANT
COVER PROBE 5X48 (BAG) ×3
GLIDEWIRE ANGLED NITR .018X260 (WIRE) ×1 IMPLANT
KIT MICROINTRODUCER STIFF 5F (SHEATH) ×1 IMPLANT
KIT PV (KITS) ×3 IMPLANT
SHEATH MICROPUNCTURE PEDAL 4FR (SHEATH) ×1 IMPLANT
TRANSDUCER W/STOPCOCK (MISCELLANEOUS) ×3 IMPLANT
TRAY PV CATH (CUSTOM PROCEDURE TRAY) ×3 IMPLANT
WIRE G V18X300CM (WIRE) ×2 IMPLANT

## 2017-02-06 NOTE — H&P (Signed)
   History and Physical Update  The patient was interviewed and re-examined.  The patient's previous History and Physical has been reviewed and is unchanged from office visit. Plan for attempted retrograde tibial revascularization.  Sohan Potvin C. Randie Heinz, MD [facility] [place] Office: [phone] Pager: [phone]  02/06/2017, 1:21 PM

## 2017-02-06 NOTE — Progress Notes (Signed)
Unable to do pt's health hx due to pt confusion. Pt states he ate a sandwich on the way to the hospital.  Pt signed his consent and states he understands what he is signing.  Unable to get in touch with any family member.  Called nursing facility who states that pt was discharged a week ago,  Messages left with all contacts listed.  Dr Randie Heinz aware of all

## 2017-02-06 NOTE — Op Note (Signed)
    Patient name: [name] MRN: [id number] DOB: 03-25-1936 Sex: male  02/06/2017 Pre-operative Diagnosis: critical right lower extremity ischemia Post-operative diagnosis:  Same Surgeon:  Apolinar Junes C. Randie Heinz, MD Procedure Performed: 1.  US guided cannulation of right dorsalis pedis artery 2.  Retrograde right anterior tibial artery angiogram  Indications:  81 year old male with end-stage renal disease and diabetes with gangrene of his right 3 through 5 toes and previous angiogram demonstrated occluded below-knee popliteal artery reconstituting only a very distal dorsalis pedis artery. He is therefore indicated for retrograde access possible intervention.  Findings: Dorsalis pedis artery was heavily calcified but easily cannulated. On retrograde imaging the anterior tibial artery was only patent to the very distal aspect. We had a perforation in the anterior tibial artery more cephalad and could not cross into the popliteal artery and elected to abort given his significant tibial disease and likelihood of poor healing.   Procedure:  The patient was identified in the holding area and taken to room 8.  The patient was then placed supine on the table and prepped and draped in the usual sterile fashion.  A time out was called.  Ultrasound was used to dorsalis pedis artery on the right and this was cannulated with micropuncture needle we placed a wire followed by micropuncture sheath. We then used V 18 wire as well as Glidewire with combination CXI catheters and quick cross catheters were able to get into the anterior tibial artery performed retrograde angiogram with the above findings. We did a perforation more cephalad on the leg given the likely poor outcome with any revascularization we elected to abort the patient will need consideration of right above-knee amputation. He tolerated this procedure well without immediate complication.     Loanne Emery C. Randie Heinz, MD [facility]  [place] Office: [phone] Pager: [phone]

## 2017-02-06 NOTE — Discharge Instructions (Signed)
Angiogram, Care After °This sheet gives you information about how to care for yourself after your procedure. Your health care provider may also give you more specific instructions. If you have problems or questions, contact your health care provider. °What can I expect after the procedure? °After the procedure, it is common to have bruising and tenderness at the catheter insertion area. °Follow these instructions at home: °Insertion site care  °· Follow instructions from your health care provider about how to take care of your insertion site. Make sure you: °¨ Wash your hands with soap and water before you change your bandage (dressing). If soap and water are not available, use hand sanitizer. °¨ Change your dressing as told by your health care provider. °¨ Leave stitches (sutures), skin glue, or adhesive strips in place. These skin closures may need to stay in place for 2 weeks or longer. If adhesive strip edges start to loosen and curl up, you may trim the loose edges. Do not remove adhesive strips completely unless your health care provider tells you to do that. °· Do not take baths, swim, or use a hot tub until your health care provider approves. °· You may shower 24-48 hours after the procedure or as told by your health care provider. °¨ Gently wash the site with plain soap and water. °¨ Pat the area dry with a clean towel. °¨ Do not rub the site. This may cause bleeding. °· Do not apply powder or lotion to the site. Keep the site clean and dry. °· Check your insertion site every day for signs of infection. Check for: °¨ Redness, swelling, or pain. °¨ Fluid or blood. °¨ Warmth. °¨ Pus or a bad smell. °Activity  °· Rest as told by your health care provider, usually for 1-2 days. °· Do not lift anything that is heavier than 10 lbs. (4.5 kg) or as told by your health care provider. °· Do not drive for 24 hours if you were given a medicine to help you relax (sedative). °· Do not drive or use heavy machinery while  taking prescription pain medicine. °General instructions  °· Return to your normal activities as told by your health care provider, usually in about a week. Ask your health care provider what activities are safe for you. °· If the catheter site starts bleeding, lie flat and put pressure on the site. If the bleeding does not stop, get help right away. This is a medical emergency. °· Drink enough fluid to keep your urine clear or pale yellow. This helps flush the contrast dye from your body. °· Take over-the-counter and prescription medicines only as told by your health care provider. °· Keep all follow-up visits as told by your health care provider. This is important. °Contact a health care provider if: °· You have a fever or chills. °· You have redness, swelling, or pain around your insertion site. °· You have fluid or blood coming from your insertion site. °· The insertion site feels warm to the touch. °· You have pus or a bad smell coming from your insertion site. °· You have bruising around the insertion site. °· You notice blood collecting in the tissue around the catheter site (hematoma). The hematoma may be painful to the touch. °Get help right away if: °· You have severe pain at the catheter insertion area. °· The catheter insertion area swells very fast. °· The catheter insertion area is bleeding, and the bleeding does not stop when you hold steady pressure on   the area. °· The area near or just beyond the catheter insertion site becomes pale, cool, tingly, or numb. °These symptoms may represent a serious problem that is an emergency. Do not wait to see if the symptoms will go away. Get medical help right away. Call your local emergency services (911 in the [place] Do not drive yourself to the hospital. °Summary °· After the procedure, it is common to have bruising and tenderness at the catheter insertion area. °· After the procedure, it is important to rest and drink plenty of fluids. °· Do not take baths,  swim, or use a hot tub until your health care provider says it is okay to do so. You may shower 24-48 hours after the procedure or as told by your health care provider. °· If the catheter site starts bleeding, lie flat and put pressure on the site. If the bleeding does not stop, get help right away. This is a medical emergency. °This information is not intended to replace advice given to you by your health care provider. Make sure you discuss any questions you have with your health care provider. °Document Released: 11/23/2004 Document Revised: 04/11/2016 Document Reviewed: 04/11/2016 °Elsevier Interactive Patient Education © 2017 [employer] ° °

## 2017-02-06 NOTE — Progress Notes (Signed)
Pts son called back and states he doesn't know anything. Pts wife has POA but is at home.  He will try and have her call us.

## 2017-02-07 ENCOUNTER — Encounter (HOSPITAL_COMMUNITY): Payer: Self-pay | Admitting: Vascular Surgery

## 2017-02-07 ENCOUNTER — Other Ambulatory Visit: Payer: Self-pay | Admitting: *Deleted

## 2017-02-09 DIAGNOSIS — R6883 Chills (without fever): Secondary | ICD-10-CM | POA: Diagnosis not present

## 2017-02-09 DIAGNOSIS — D689 Coagulation defect, unspecified: Secondary | ICD-10-CM | POA: Diagnosis not present

## 2017-02-09 DIAGNOSIS — N186 End stage renal disease: Secondary | ICD-10-CM | POA: Diagnosis not present

## 2017-02-09 DIAGNOSIS — Z452 Encounter for adjustment and management of vascular access device: Secondary | ICD-10-CM | POA: Diagnosis not present

## 2017-02-09 DIAGNOSIS — E1152 Type 2 diabetes mellitus with diabetic peripheral angiopathy with gangrene: Secondary | ICD-10-CM | POA: Diagnosis not present

## 2017-02-09 DIAGNOSIS — Z992 Dependence on renal dialysis: Secondary | ICD-10-CM | POA: Diagnosis not present

## 2017-02-09 DIAGNOSIS — I96 Gangrene, not elsewhere classified: Secondary | ICD-10-CM | POA: Diagnosis not present

## 2017-02-09 DIAGNOSIS — R52 Pain, unspecified: Secondary | ICD-10-CM | POA: Diagnosis not present

## 2017-02-09 DIAGNOSIS — E1129 Type 2 diabetes mellitus with other diabetic kidney complication: Secondary | ICD-10-CM | POA: Diagnosis not present

## 2017-02-09 DIAGNOSIS — E1122 Type 2 diabetes mellitus with diabetic chronic kidney disease: Secondary | ICD-10-CM | POA: Diagnosis not present

## 2017-02-09 DIAGNOSIS — D631 Anemia in chronic kidney disease: Secondary | ICD-10-CM | POA: Diagnosis not present

## 2017-02-09 DIAGNOSIS — M7989 Other specified soft tissue disorders: Secondary | ICD-10-CM | POA: Diagnosis not present

## 2017-02-09 DIAGNOSIS — J9811 Atelectasis: Secondary | ICD-10-CM | POA: Diagnosis not present

## 2017-02-09 DIAGNOSIS — I120 Hypertensive chronic kidney disease with stage 5 chronic kidney disease or end stage renal disease: Secondary | ICD-10-CM | POA: Diagnosis not present

## 2017-02-09 DIAGNOSIS — N2581 Secondary hyperparathyroidism of renal origin: Secondary | ICD-10-CM | POA: Diagnosis not present

## 2017-02-10 ENCOUNTER — Inpatient Hospital Stay (HOSPITAL_COMMUNITY): Payer: Medicare Other

## 2017-02-10 ENCOUNTER — Inpatient Hospital Stay (HOSPITAL_COMMUNITY)
Admission: AD | Admit: 2017-02-10 | Discharge: 2017-02-19 | DRG: 239 | Disposition: A | Discharge status: Skilled Nursing Facility | Payer: Medicare Other | Source: Other Acute Inpatient Hospital | Attending: Family Medicine | Admitting: Family Medicine

## 2017-02-10 DIAGNOSIS — R262 Difficulty in walking, not elsewhere classified: Secondary | ICD-10-CM | POA: Diagnosis not present

## 2017-02-10 DIAGNOSIS — I252 Old myocardial infarction: Secondary | ICD-10-CM

## 2017-02-10 DIAGNOSIS — Z95828 Presence of other vascular implants and grafts: Secondary | ICD-10-CM

## 2017-02-10 DIAGNOSIS — Z7409 Other reduced mobility: Secondary | ICD-10-CM | POA: Diagnosis not present

## 2017-02-10 DIAGNOSIS — D649 Anemia, unspecified: Secondary | ICD-10-CM | POA: Diagnosis present

## 2017-02-10 DIAGNOSIS — Z833 Family history of diabetes mellitus: Secondary | ICD-10-CM | POA: Diagnosis not present

## 2017-02-10 DIAGNOSIS — Z79899 Other long term (current) drug therapy: Secondary | ICD-10-CM | POA: Diagnosis not present

## 2017-02-10 DIAGNOSIS — E1122 Type 2 diabetes mellitus with diabetic chronic kidney disease: Secondary | ICD-10-CM | POA: Diagnosis not present

## 2017-02-10 DIAGNOSIS — Z951 Presence of aortocoronary bypass graft: Secondary | ICD-10-CM

## 2017-02-10 DIAGNOSIS — L89620 Pressure ulcer of left heel, unstageable: Secondary | ICD-10-CM | POA: Diagnosis present

## 2017-02-10 DIAGNOSIS — Z9115 Patient's noncompliance with renal dialysis: Secondary | ICD-10-CM | POA: Diagnosis not present

## 2017-02-10 DIAGNOSIS — E1152 Type 2 diabetes mellitus with diabetic peripheral angiopathy with gangrene: Secondary | ICD-10-CM | POA: Diagnosis not present

## 2017-02-10 DIAGNOSIS — I739 Peripheral vascular disease, unspecified: Secondary | ICD-10-CM | POA: Diagnosis not present

## 2017-02-10 DIAGNOSIS — G9341 Metabolic encephalopathy: Secondary | ICD-10-CM | POA: Diagnosis not present

## 2017-02-10 DIAGNOSIS — J9811 Atelectasis: Secondary | ICD-10-CM | POA: Diagnosis not present

## 2017-02-10 DIAGNOSIS — E11621 Type 2 diabetes mellitus with foot ulcer: Secondary | ICD-10-CM | POA: Diagnosis not present

## 2017-02-10 DIAGNOSIS — Z794 Long term (current) use of insulin: Secondary | ICD-10-CM

## 2017-02-10 DIAGNOSIS — Z992 Dependence on renal dialysis: Secondary | ICD-10-CM

## 2017-02-10 DIAGNOSIS — E875 Hyperkalemia: Secondary | ICD-10-CM | POA: Diagnosis not present

## 2017-02-10 DIAGNOSIS — N2581 Secondary hyperparathyroidism of renal origin: Secondary | ICD-10-CM | POA: Diagnosis not present

## 2017-02-10 DIAGNOSIS — Z452 Encounter for adjustment and management of vascular access device: Secondary | ICD-10-CM | POA: Diagnosis not present

## 2017-02-10 DIAGNOSIS — E538 Deficiency of other specified B group vitamins: Secondary | ICD-10-CM | POA: Diagnosis not present

## 2017-02-10 DIAGNOSIS — D631 Anemia in chronic kidney disease: Secondary | ICD-10-CM | POA: Diagnosis present

## 2017-02-10 DIAGNOSIS — N186 End stage renal disease: Secondary | ICD-10-CM

## 2017-02-10 DIAGNOSIS — Z955 Presence of coronary angioplasty implant and graft: Secondary | ICD-10-CM | POA: Diagnosis not present

## 2017-02-10 DIAGNOSIS — Z888 Allergy status to other drugs, medicaments and biological substances status: Secondary | ICD-10-CM

## 2017-02-10 DIAGNOSIS — Z741 Need for assistance with personal care: Secondary | ICD-10-CM | POA: Diagnosis not present

## 2017-02-10 DIAGNOSIS — Z9119 Patient's noncompliance with other medical treatment and regimen: Secondary | ICD-10-CM

## 2017-02-10 DIAGNOSIS — L8990 Pressure ulcer of unspecified site, unspecified stage: Secondary | ICD-10-CM | POA: Insufficient documentation

## 2017-02-10 DIAGNOSIS — G9340 Encephalopathy, unspecified: Secondary | ICD-10-CM | POA: Diagnosis not present

## 2017-02-10 DIAGNOSIS — R4182 Altered mental status, unspecified: Secondary | ICD-10-CM | POA: Diagnosis not present

## 2017-02-10 DIAGNOSIS — I509 Heart failure, unspecified: Secondary | ICD-10-CM | POA: Diagnosis present

## 2017-02-10 DIAGNOSIS — E8889 Other specified metabolic disorders: Secondary | ICD-10-CM | POA: Diagnosis not present

## 2017-02-10 DIAGNOSIS — I132 Hypertensive heart and chronic kidney disease with heart failure and with stage 5 chronic kidney disease, or end stage renal disease: Secondary | ICD-10-CM | POA: Diagnosis present

## 2017-02-10 DIAGNOSIS — Z4781 Encounter for orthopedic aftercare following surgical amputation: Secondary | ICD-10-CM | POA: Diagnosis not present

## 2017-02-10 DIAGNOSIS — I10 Essential (primary) hypertension: Secondary | ICD-10-CM | POA: Diagnosis not present

## 2017-02-10 DIAGNOSIS — L97414 Non-pressure chronic ulcer of right heel and midfoot with necrosis of bone: Secondary | ICD-10-CM | POA: Diagnosis not present

## 2017-02-10 DIAGNOSIS — E118 Type 2 diabetes mellitus with unspecified complications: Secondary | ICD-10-CM | POA: Diagnosis present

## 2017-02-10 DIAGNOSIS — R51 Headache: Secondary | ICD-10-CM | POA: Diagnosis not present

## 2017-02-10 DIAGNOSIS — I70261 Atherosclerosis of native arteries of extremities with gangrene, right leg: Secondary | ICD-10-CM | POA: Diagnosis not present

## 2017-02-10 DIAGNOSIS — A419 Sepsis, unspecified organism: Secondary | ICD-10-CM | POA: Diagnosis not present

## 2017-02-10 DIAGNOSIS — L03116 Cellulitis of left lower limb: Secondary | ICD-10-CM | POA: Diagnosis not present

## 2017-02-10 DIAGNOSIS — M6281 Muscle weakness (generalized): Secondary | ICD-10-CM | POA: Diagnosis not present

## 2017-02-10 DIAGNOSIS — L89610 Pressure ulcer of right heel, unstageable: Secondary | ICD-10-CM | POA: Diagnosis present

## 2017-02-10 DIAGNOSIS — M109 Gout, unspecified: Secondary | ICD-10-CM | POA: Diagnosis not present

## 2017-02-10 DIAGNOSIS — T829XXA Unspecified complication of cardiac and vascular prosthetic device, implant and graft, initial encounter: Secondary | ICD-10-CM

## 2017-02-10 DIAGNOSIS — F17290 Nicotine dependence, other tobacco product, uncomplicated: Secondary | ICD-10-CM | POA: Diagnosis present

## 2017-02-10 DIAGNOSIS — Z7982 Long term (current) use of aspirin: Secondary | ICD-10-CM | POA: Diagnosis not present

## 2017-02-10 DIAGNOSIS — I2510 Atherosclerotic heart disease of native coronary artery without angina pectoris: Secondary | ICD-10-CM | POA: Diagnosis not present

## 2017-02-10 DIAGNOSIS — I120 Hypertensive chronic kidney disease with stage 5 chronic kidney disease or end stage renal disease: Secondary | ICD-10-CM | POA: Diagnosis not present

## 2017-02-10 DIAGNOSIS — L97519 Non-pressure chronic ulcer of other part of right foot with unspecified severity: Secondary | ICD-10-CM | POA: Diagnosis not present

## 2017-02-10 DIAGNOSIS — I96 Gangrene, not elsewhere classified: Secondary | ICD-10-CM | POA: Diagnosis not present

## 2017-02-10 DIAGNOSIS — T829XXD Unspecified complication of cardiac and vascular prosthetic device, implant and graft, subsequent encounter: Secondary | ICD-10-CM | POA: Diagnosis not present

## 2017-02-10 LAB — CBC
HCT: 30.1 % — ABNORMAL LOW (ref 39.0–52.0)
HEMOGLOBIN: 9.6 g/dL — AB (ref 13.0–17.0)
MCH: 30.2 pg (ref 26.0–34.0)
MCHC: 31.9 g/dL (ref 30.0–36.0)
MCV: 94.7 fL (ref 78.0–100.0)
Platelets: 282 10*3/uL (ref 150–400)
RBC: 3.18 MIL/uL — ABNORMAL LOW (ref 4.22–5.81)
RDW: 15 % (ref 11.5–15.5)
WBC: 10.2 10*3/uL (ref 4.0–10.5)

## 2017-02-10 LAB — COMPREHENSIVE METABOLIC PANEL
ALBUMIN: 2.7 g/dL — AB (ref 3.5–5.0)
ALT: 27 U/L (ref 17–63)
ANION GAP: 14 (ref 5–15)
AST: 26 U/L (ref 15–41)
Alkaline Phosphatase: 114 U/L (ref 38–126)
BUN: 33 mg/dL — AB (ref 6–20)
CO2: 31 mmol/L (ref 22–32)
Calcium: 8.6 mg/dL — ABNORMAL LOW (ref 8.9–10.3)
Chloride: 91 mmol/L — ABNORMAL LOW (ref 101–111)
Creatinine, Ser: 9.43 mg/dL — ABNORMAL HIGH (ref 0.61–1.24)
GFR calc Af Amer: 5 mL/min — ABNORMAL LOW (ref >60)
GFR calc non Af Amer: 5 mL/min — ABNORMAL LOW (ref >60)
GLUCOSE: 69 mg/dL (ref 65–99)
POTASSIUM: 4.1 mmol/L (ref 3.5–5.1)
Sodium: 136 mmol/L (ref 135–145)
Total Bilirubin: 0.8 mg/dL (ref 0.3–1.2)
Total Protein: 6.6 g/dL (ref 6.5–8.1)

## 2017-02-10 LAB — GLUCOSE, CAPILLARY
GLUCOSE-CAPILLARY: 97 mg/dL (ref 65–99)
Glucose-Capillary: 67 mg/dL (ref 65–99)
Glucose-Capillary: 100 mg/dL — ABNORMAL HIGH (ref 65–99)
Glucose-Capillary: 62 mg/dL — ABNORMAL LOW (ref 65–99)

## 2017-02-10 LAB — BLOOD GAS, ARTERIAL
ACID-BASE EXCESS: 9 mmol/L — AB (ref 0.0–2.0)
BICARBONATE: 33.1 mmol/L — AB (ref 20.0–28.0)
DRAWN BY: 345601
FIO2: 21
O2 SAT: 95.6 %
PATIENT TEMPERATURE: 98.6
PO2 ART: 75.9 mmHg — AB (ref 83.0–108.0)
pCO2 arterial: 45.6 mmHg (ref 32.0–48.0)
pH, Arterial: 7.474 — ABNORMAL HIGH (ref 7.350–7.450)

## 2017-02-10 LAB — MRSA PCR SCREENING: MRSA by PCR: POSITIVE — AB

## 2017-02-10 MED ORDER — DEXTROSE 5 % IV SOLN
1.0000 g | Freq: Every day | INTRAVENOUS | Status: AC
  Administered 2017-02-10 – 2017-02-12 (×3): 1 g via INTRAVENOUS
  Filled 2017-02-10 (×3): qty 1

## 2017-02-10 MED ORDER — SODIUM CHLORIDE 0.9 % IV SOLN: 100.0000 mL | INTRAVENOUS | Status: DC | PRN

## 2017-02-10 MED ORDER — VANCOMYCIN HCL IN DEXTROSE 1-5 GM/200ML-% IV SOLN
1000.0000 mg | INTRAVENOUS | Status: AC
  Administered 2017-02-11 – 2017-02-15 (×3): 1000 mg via INTRAVENOUS
  Filled 2017-02-10 (×3): qty 200

## 2017-02-10 MED ORDER — ALLOPURINOL 100 MG PO TABS
100.0000 mg | ORAL_TABLET | Freq: Every evening | ORAL | Status: DC
  Administered 2017-02-11 – 2017-02-18 (×8): 100 mg via ORAL
  Filled 2017-02-10 (×8): qty 1

## 2017-02-10 MED ORDER — HEPARIN SODIUM (PORCINE) 1000 UNIT/ML DIALYSIS
7000.0000 [IU] | Freq: Once | INTRAMUSCULAR | Status: DC
  Filled 2017-02-10: qty 7

## 2017-02-10 MED ORDER — ACETAMINOPHEN 325 MG PO TABS: 650.0000 mg | ORAL_TABLET | Freq: Four times a day (QID) | ORAL | Status: DC | PRN

## 2017-02-10 MED ORDER — DIPHENHYDRAMINE HCL 25 MG PO CAPS
25.0000 mg | ORAL_CAPSULE | Freq: Four times a day (QID) | ORAL | Status: DC | PRN
  Administered 2017-02-14 – 2017-02-17 (×3): 25 mg via ORAL
  Filled 2017-02-10 (×3): qty 1

## 2017-02-10 MED ORDER — NEPRO/CARBSTEADY PO LIQD
237.0000 mL | Freq: Two times a day (BID) | ORAL | Status: DC
  Administered 2017-02-11 – 2017-02-19 (×8): 237 mL via ORAL
  Filled 2017-02-10 (×21): qty 237

## 2017-02-10 MED ORDER — SODIUM CHLORIDE 0.9% FLUSH
10.0000 mL | INTRAVENOUS | Status: DC | PRN
  Administered 2017-02-12: 40 mL
  Filled 2017-02-10: qty 40

## 2017-02-10 MED ORDER — HEPARIN SODIUM (PORCINE) 5000 UNIT/ML IJ SOLN
5000.0000 [IU] | Freq: Three times a day (TID) | INTRAMUSCULAR | Status: DC
  Administered 2017-02-10 – 2017-02-13 (×10): 5000 [IU] via SUBCUTANEOUS
  Filled 2017-02-10 (×8): qty 1

## 2017-02-10 MED ORDER — CYANOCOBALAMIN 500 MCG PO TABS
250.0000 ug | ORAL_TABLET | Freq: Every evening | ORAL | Status: DC
  Administered 2017-02-11 – 2017-02-18 (×8): 250 ug via ORAL
  Filled 2017-02-10 (×10): qty 1

## 2017-02-10 MED ORDER — ALTEPLASE 2 MG IJ SOLR: 2.0000 mg | Freq: Once | INTRAMUSCULAR | Status: DC | PRN

## 2017-02-10 MED ORDER — RENA-VITE PO TABS
1.0000 | ORAL_TABLET | Freq: Every day | ORAL | Status: DC
  Administered 2017-02-10 – 2017-02-18 (×9): 1 via ORAL
  Filled 2017-02-10 (×9): qty 1

## 2017-02-10 MED ORDER — ACETAMINOPHEN 650 MG RE SUPP: 650.0000 mg | Freq: Four times a day (QID) | RECTAL | Status: DC | PRN

## 2017-02-10 MED ORDER — DARBEPOETIN ALFA 60 MCG/0.3ML IJ SOSY: 60.0000 ug | PREFILLED_SYRINGE | INTRAMUSCULAR | Status: DC

## 2017-02-10 MED ORDER — HEPARIN SODIUM (PORCINE) 1000 UNIT/ML DIALYSIS: 1000.0000 [IU] | INTRAMUSCULAR | Status: DC | PRN

## 2017-02-10 MED ORDER — ACETAMINOPHEN 325 MG PO TABS
650.0000 mg | ORAL_TABLET | Freq: Four times a day (QID) | ORAL | Status: DC | PRN
  Administered 2017-02-18: 650 mg via ORAL
  Filled 2017-02-10 (×2): qty 2

## 2017-02-10 MED ORDER — OXYCODONE HCL 5 MG PO TABS
10.0000 mg | ORAL_TABLET | ORAL | Status: DC | PRN
  Administered 2017-02-11 – 2017-02-17 (×16): 10 mg via ORAL
  Filled 2017-02-10 (×13): qty 2

## 2017-02-10 MED ORDER — VANCOMYCIN HCL 10 G IV SOLR
2000.0000 mg | Freq: Once | INTRAVENOUS | Status: DC
  Filled 2017-02-10: qty 2000

## 2017-02-10 MED ORDER — ONDANSETRON HCL 4 MG PO TABS: 4.0000 mg | ORAL_TABLET | Freq: Four times a day (QID) | ORAL | Status: DC | PRN

## 2017-02-10 MED ORDER — LIDOCAINE HCL (PF) 1 % IJ SOLN: 5.0000 mL | INTRAMUSCULAR | Status: DC | PRN

## 2017-02-10 MED ORDER — CALCITRIOL 0.25 MCG PO CAPS
0.2500 ug | ORAL_CAPSULE | ORAL | Status: DC
  Administered 2017-02-11 – 2017-02-18 (×4): 0.25 ug via ORAL

## 2017-02-10 MED ORDER — DEXTROSE 50 % IV SOLN
INTRAVENOUS | Status: AC
  Administered 2017-02-10: 25 mL
  Filled 2017-02-10: qty 50

## 2017-02-10 MED ORDER — DEXTROSE 5 % IV SOLN
1.0000 g | INTRAVENOUS | Status: DC
  Filled 2017-02-10: qty 1

## 2017-02-10 MED ORDER — LIDOCAINE-PRILOCAINE 2.5-2.5 % EX CREA: 1.0000 "application " | TOPICAL_CREAM | CUTANEOUS | Status: DC | PRN

## 2017-02-10 MED ORDER — DEXTROSE 50 % IV SOLN
INTRAVENOUS | Status: AC
  Administered 2017-02-10: 50 mL
  Filled 2017-02-10: qty 50

## 2017-02-10 MED ORDER — ONDANSETRON HCL 4 MG/2ML IJ SOLN: 4.0000 mg | Freq: Four times a day (QID) | INTRAMUSCULAR | Status: DC | PRN

## 2017-02-10 MED ORDER — PENTAFLUOROPROP-TETRAFLUOROETH EX AERO: 1.0000 "application " | INHALATION_SPRAY | CUTANEOUS | Status: DC | PRN

## 2017-02-10 MED ORDER — ASPIRIN EC 81 MG PO TBEC
81.0000 mg | DELAYED_RELEASE_TABLET | Freq: Every evening | ORAL | Status: DC
  Administered 2017-02-11 – 2017-02-18 (×8): 81 mg via ORAL
  Filled 2017-02-10 (×8): qty 1

## 2017-02-10 MED ORDER — VANCOMYCIN HCL 10 G IV SOLR
2000.0000 mg | Freq: Once | INTRAVENOUS | Status: AC
  Administered 2017-02-10: 2000 mg via INTRAVENOUS
  Filled 2017-02-10: qty 2000

## 2017-02-10 MED ORDER — SUCROFERRIC OXYHYDROXIDE 500 MG PO CHEW
1000.0000 mg | CHEWABLE_TABLET | Freq: Three times a day (TID) | ORAL | Status: DC
  Administered 2017-02-11 – 2017-02-17 (×11): 1000 mg via ORAL
  Administered 2017-02-17: 500 mg via ORAL
  Administered 2017-02-17 – 2017-02-19 (×4): 1000 mg via ORAL
  Filled 2017-02-10 (×32): qty 2

## 2017-02-10 NOTE — Consult Note (Signed)
WOC Nurse wound consult note Reason for Consult: Gangrenous feet WOC Nurse consulted for feet.  Vascular Surgery is following closely. It is noted that Orthopedics was also consulted on 9/23. Ortho deferred to [facility] due to upcoming amputation scheduled with Dr. Randie Heinz ([facility]).  There is no role for WOC nurse at this time. WOC nursing team will not follow, but will remain available to this patient, the nursing and medical teams.  Please re-consult if needed. Thanks, Ladona Mow, MSN, RN, GNP, [place]  Pager# [phone]

## 2017-02-10 NOTE — Consult Note (Signed)
[facility] Renal Consultation Note    Indication for Consultation:  Management of ESRD/hemodialysis; anemia, hypertension/volume and secondary hyperparathyroidism  Referring MD:  Lyda Perone, MD PCP: Leonia Reader, Barbara Cower, MD  HPI: [name] is a 81 y.o. male with ESRD on MWF HD in [place] who was here several times  earlier this month for.nonhealing feet wounds, including angiography.  Nothing was reconstructible.  He had previously declined amputation. He now has bialteral infected and ischemic toes R > L and needs at least a AKA.  He had  PMHx also significant for DM, THN, bout, partial transverse colectomy 2009, CAD, CHF.  He transferred here from [facility] early today where his family had brought him due to progressive weakness.    His last HD treatment was 9/22 (missed Friday). He ran his full time and left with a post wt 101.6 nt UF 2.9.  His lowest BP during treatment was 79/49 which is not uncommon for him. Per admission H and P he received Ancef and Bactrim in their ED.   WBC at [facility] was 10.5 and 10.2 here. CXR negative for acute findings - done here for catheter placment verification.  He is disengaged in his room.  Keeps pulling the covers over his head and does't want to to talk to me.  His next HD is due Monday.  Past Medical History:  Diagnosis Date  . Anginal pain (HCC)    before CABG  . Arthritis   . CHF (congestive heart failure) (HCC)   . Constipation   . Coronary artery disease   . Diabetes mellitus   . ESRD (end stage renal disease) (HCC)    TuesThurs Sat [place]  . Hypertension   . Myocardial infarction (HCC)   . Pneumonia    2 years ago   Past Surgical History:  Procedure Laterality Date  . A/V FISTULAGRAM Left 10/31/2016   Procedure: A/V Fistulagram;  Surgeon: Maeola Harman, MD;  Location: [facility] INVASIVE CV LAB;  Service: Cardiovascular;  Laterality: Left;  . ABDOMINAL AORTOGRAM W/LOWER EXTREMITY N/A 01/03/2017    Procedure: ABDOMINAL AORTOGRAM W/LOWER EXTREMITY;  Surgeon: Fransisco Hertz, MD;  Location: [facility] INVASIVE CV LAB;  Service: Cardiovascular;  Laterality: N/A;  . ABDOMINAL AORTOGRAM W/LOWER EXTREMITY N/A 02/06/2017   Procedure: ABDOMINAL AORTOGRAM W/LOWER EXTREMITY;  Surgeon: Maeola Harman, MD;  Location: [facility] INVASIVE CV LAB;  Service: Cardiovascular;  Laterality: N/A;  . ARTERIOVENOUS GRAFT PLACEMENT Left   . BACK SURGERY    . CORONARY ARTERY BYPASS GRAFT    . LAPAROSCOPIC COLON RESECTION     growth  . PERIPHERAL VASCULAR BALLOON ANGIOPLASTY Right 02/06/2017   Procedure: PERIPHERAL VASCULAR BALLOON ANGIOPLASTY;  Surgeon: Maeola Harman, MD;  Location: [facility] INVASIVE CV LAB;  Service: Cardiovascular;  Laterality: Right;  Anterior tibial  . REVISION OF ARTERIOVENOUS GORETEX GRAFT Left 11/16/2016   Procedure: REVISION OF ARTERIOVENOUS GORETEX GRAFT;  Surgeon: Chuck Hint, MD;  Location: [facility] OR;  Service: Vascular;  Laterality: Left;  . REVISON OF ARTERIOVENOUS FISTULA Left 09/11/2013   Procedure: REVISON OF ARTERIOVENOUS FISTULA-LEFT ARM; POSSIBLE VENOPLASTY WITH INTEROPERATIVE FISTULOGRAM;  Surgeon: Chuck Hint, MD;  Location: [facility] OR;  Service: Vascular;  Laterality: Left;   Family History  Problem Relation Age of Onset  . Cancer Father   . Diabetes Sister    Social History:  reports that he has never smoked. His smokeless tobacco use includes Snuff. He reports that he does not drink alcohol or use drugs. Allergies  Allergen Reactions  . Iohexol Swelling and Rash  . Zanaflex [Tizanidine] Other (See Comments)    Shut kidneys down Unable to walk or void  . Ezetimibe-Simvastatin Other (See Comments)    Elevated liver enzymes  . Fluvastatin Other (See Comments)    Elevated liver enzymes  . Simvastatin Other (See Comments)    Elevated liver enzymes   Prior to Admission medications   Medication Sig Start Date End Date Taking? Authorizing Provider   allopurinol (ZYLOPRIM) 100 MG tablet Take 100 mg by mouth every evening.    Yes [provider]  aspirin EC 81 MG tablet Take 81 mg by mouth every evening.    Yes [provider]  Aspirin-Salicylamide-Caffeine (BC HEADACHE POWDER PO) Take 2 Packages by mouth as needed (headache).   Yes [provider]  oxyCODONE-acetaminophen (PERCOCET/ROXICET) 5-325 MG tablet Take 1 tablet by mouth every 6 (six) hours as needed for pain. 02/05/17  Yes [provider]  vitamin B-12 (CYANOCOBALAMIN) 250 MCG tablet Take 250 mcg by mouth every evening.    Yes [provider]   Current Facility-Administered Medications  Medication Dose Route Frequency Provider Last Rate Last Dose  . 0.9 %  sodium chloride infusion  100 mL Intravenous PRN Delano Metz, MD      . 0.9 %  sodium chloride infusion  100 mL Intravenous PRN Delano Metz, MD      . acetaminophen (TYLENOL) tablet 650 mg  650 mg Oral Q6H PRN Hillary Bow, DO       Or  . acetaminophen (TYLENOL) suppository 650 mg  650 mg Rectal Q6H PRN Hillary Bow, DO      . allopurinol (ZYLOPRIM) tablet 100 mg  100 mg Oral QPM Lyda Perone M, DO      . alteplase (CATHFLO ACTIVASE) injection 2 mg  2 mg Intracatheter Once PRN Delano Metz, MD      . aspirin EC tablet 81 mg  81 mg Oral QPM Julian Reil, Jared M, DO      . cefTAZidime (FORTAZ) 1 g in dextrose 5 % 50 mL IVPB  1 g Intravenous Q24H Blake Divine, RPH      . cyanocobalamin tablet 250 mcg  250 mcg Oral QPM Lyda Perone M, DO      . diphenhydrAMINE (BENADRYL) capsule 25 mg  25 mg Oral Q6H PRN Hillary Bow, DO      . heparin injection 1,000 Units  1,000 Units Dialysis PRN Delano Metz, MD      . heparin injection 5,000 Units  5,000 Units Subcutaneous Q8H Lyda Perone M, DO   5,000 Units at 02/10/17 0541  . [START ON 02/11/2017] heparin injection 7,000 Units  7,000 Units Dialysis Once in dialysis Delano Metz, MD      . lidocaine (PF) (XYLOCAINE)  1 % injection 5 mL  5 mL Intradermal PRN Delano Metz, MD      . lidocaine-prilocaine (EMLA) cream 1 application  1 application Topical PRN Delano Metz, MD      . ondansetron [facility]) tablet 4 mg  4 mg Oral Q6H PRN Hillary Bow, DO       Or  . ondansetron [facility]) injection 4 mg  4 mg Intravenous Q6H PRN Hillary Bow, DO      . oxyCODONE (Oxy IR/ROXICODONE) immediate release tablet 10 mg  10 mg Oral Q4H PRN Hillary Bow, DO      . pentafluoroprop-tetrafluoroeth (GEBAUERS) aerosol 1 application  1 application Topical PRN Delano Metz,  MD      . sodium chloride flush (NS) 0.9 % injection 10-40 mL  10-40 mL Intracatheter PRN Hillary Bow, DO      . sucroferric oxyhydroxide [facility]) chewable tablet 1,000 mg  1,000 mg Oral TID WC Hillary Bow, DO      . vancomycin (VANCOCIN) 2,000 mg in sodium chloride 0.9 % 500 mL IVPB  2,000 mg Intravenous Once Blake Divine, [facility]       Labs: Basic Metabolic Panel:  Recent Labs Lab 02/06/17 1328 02/10/17 0403  NA 134* 136  K 4.8 4.1  CL 95* 91*  CO2  --  31  GLUCOSE 88 69  BUN 43* 33*  CREATININE 11.30* 9.43*  CALCIUM  --  8.6*   Liver Function Tests:  Recent Labs Lab 02/10/17 0403  AST 26  ALT 27  ALKPHOS 114  BILITOT 0.8  PROT 6.6  ALBUMIN 2.7*   CBC:  Recent Labs Lab 02/06/17 1328 02/10/17 0403  WBC  --  10.2  HGB 10.9* 9.6*  HCT 32.0* 30.1*  MCV  --  94.7  PLT  --  282   CBG:  Recent Labs Lab 02/06/17 1145 02/10/17 0536 02/10/17 0633  GLUCAP 102* 67 100*   Iron Studies: No results for input(s): IRON, TIBC, TRANSFERRIN, FERRITIN in the last 72 hours. Studies/Results: Dg Chest Port 1 View  Result Date: 02/10/2017 CLINICAL DATA:  Right PICC placement EXAM: PORTABLE CHEST 1 VIEW COMPARISON:  02/09/2017 FINDINGS: Right-sided central venous catheter tip has been straightened, it probably projects over the distal jugular confluence allowing for patient rotation. Post sternotomy changes. Linear  atelectasis at the left lung base. Stable cardiomediastinal silhouette. IMPRESSION: Right-sided central venous catheter tip has been straightened, tip suspected to project over distal jugular venous confluence allowing for markedly rotated patient. No change in cardiomegaly and bibasilar atelectasis Electronically Signed   By: Jasmine Pang M.D.   On: 02/10/2017 03:39    ROS: As per HPI.   Physical Exam: Vitals:   02/10/17 0120 02/10/17 0145 02/10/17 0541  BP: (!) 155/55 (!) 155/55 136/74  Pulse:   78  Resp:   17  Temp: 98.6 F (37 C)  98.8 F (37.1 C)  TempSrc: Oral  Oral  SpO2: 100%  100%  Weight: 97.4 kg (214 lb 11.2 oz)    Height:  (1.778 m)       General:  Elderly AAM NAD under the covers Head: NCAT sclera not icteric MMM Neck: Supple.  Lungs: no rales poor effort Breathing is unlabored. Heart: RRR with S1 S2.  Abdomen: soft NT + BS Lower extremities: bilateral black ischemic toes R entending into forefoot on the right + pitting LE edema R > L Neuro: A & O  X 3. Moves all extremities spontaneously. Psych:  Disengaged, knows he is at [facility] Dialysis Access: AVGG left lower + bruit  Dialysis Orders: [facility] MWF  4.25 hr 450/800 2 K 2 Ca EDW 100.5 - profile 2 left lwoer AVGG heparin 7500 Mircera 50 q 2 wks - last 9/22, calcitriol 0.25 Parsabiv 5 Recent labs: hgb 10.8 29% sat iPTH 1780 Ca 9.8 P 6.8  Assessment/Plan: 1.  Bilateral infected and ischemic toes  R > L; needs amputation; would be reasonable to assess goals of care before this especially if the patient does not wish to proceed.  He will ultimately need bilateral amputationl; BC drawn on empiric Vanc and Fortaz 2.  ESRD -  MWF -next HD tomorrow 3.  Hypertension/volume  - BP labile - plan volume reduction-not on BP meds- has BP drops with HD - change to profile 4 lower temp- not on BP meds 4.  Anemia  - hgb drifting down; not due for ESA dose til Oct 5.  Metabolic bone disease -  Continue calcitriol and binders;  Parsabiv not avail in hospital 6.  Nutrition - renal diet/multivitamin/Nepro  Sheffield Slider, PA-C [facility] Beeper [phone] 02/10/2017, 12:45 PM   Pt seen, examined and agree w A/P as above. ESRD patient with ischemic bilat LE's , recent w/u showed not candidate for revascularization.  Now presenting with bilat foot infection/ gangrene and altered mental status. Pt is awake but very groggy and minimally verbal, no oriented during my exam.  Prob feeling the effects of the infection.  Have d/w primary MD, is not on many home meds, except perhaps percocet that could be causing AMS.  Will need close observation.  Vasc surg consult is pending. No need for HD today, will follow.  HD planned for tomorrow.  Vinson Moselle MD [employer] pager [phone]   02/10/2017, 1:24 PM

## 2017-02-10 NOTE — Progress Notes (Signed)
Pharmacy Antibiotic Note  [name] is a 81 y.o. male admitted on 02/10/2017 with wound infection/gangrenous toes.  Pharmacy has been consulted for ceftazidime and vancomycin dosing. WBC count is within normal limits at this time and patient is afebrile. Of note, patient is on HD outpatient on TThS schedule, will continue to watch for HD schedule inpatient and adjust antibiotic therapy accordingly. Patient received one dose of bactrim and cefazolin at outside facility on 9/22.   Plan: Vancomycin  IV x 1 loading dose  Ceftazidime  IV q24 hours  Monitor clinical picture, cultures, plan for surgery and HD  Vancomycin levels as indicated  Target pre-HD levels 15-73mcg/mL  Target post-HD level 5-15 mcg/mL   Height:  (177.8 cm) Weight: 214 lb 11.2 oz (97.4 kg) IBW/kg (Calculated) : 73  Temp (24hrs), Avg:98.7 F (37.1 C), Min:98.6 F (37 C), Max:98.8 F (37.1 C)   Recent Labs Lab 02/06/17 1328 02/10/17 0403  WBC  --  10.2  CREATININE 11.30* 9.43*    Estimated Creatinine Clearance: 7.4 mL/min (A) (by C-G formula based on SCr of 9.43 mg/dL (H)).    Allergies  Allergen Reactions  . Iohexol Swelling and Rash  . Zanaflex [Tizanidine] Other (See Comments)    Shut kidneys down Unable to walk or void  . Ezetimibe-Simvastatin Other (See Comments)    Elevated liver enzymes  . Fluvastatin Other (See Comments)    Elevated liver enzymes  . Simvastatin Other (See Comments)    Elevated liver enzymes    Antimicrobials this admission: Vancomycin 9/22>> Ceftazidime 9/22>>   Microbiology results: 9/23 MRSA PCR: negative   Thank you for allowing pharmacy to be a part of this patient's care.  Blake Divine 02/10/2017 8:48 AM

## 2017-02-10 NOTE — Progress Notes (Signed)
Patient ID: [name], male   DOB: [birth year], 81 y.o.   MRN: [id number] I was called about this patient to assess his right foot. However, he is already followed closely by Vascular Surgery here and is apparently scheduled for an upcoming amputation by Dr. Randie Heinz with [facility].  I will defer to Vascular Surgery unless otherwise indicated.

## 2017-02-10 NOTE — Final Progress Note (Signed)
Pt transfer to [address] 01 unable to complete Admission assesement cos pt confused and a poor historian.

## 2017-02-10 NOTE — H&P (Signed)
History and Physical    [name]:[id number] DOB: 1935/06/13 DOA: 02/10/2017  PCP: Crist Fat, MD  Patient coming from: [facility] ED  I have personally briefly reviewed patient's old medical records in [facility]  Chief Complaint: AMS, gangrene of foot  HPI: [name] is a 81 y.o. male with medical history significant of ESRD dialysis TTS, HTN, MI, PAD, DM2.  Patient was recently admitted and discharged last month with cellulitis of the RLE foot.  Due to gangrene he had follow up cath with Dr. Randie Heinz on 9/19.  They were unable to re-vascularize the R foot.  R AKA was recommended.  Patient was supposed to go to [facility] prior to this but he refused to go.  In the last couple of days, patient has been having increasing weakness and lethargy.  Today after getting home he had increasing lethargy and confusion.  Patient brought in to [facility] ED by family.  ED Course: In the [facility] ED patient was put on ancef and bactrim.  Central line was placed which appears to have been malpositioned based on X ray (unclear if it got re-positioned or not, getting CXR now to confirm).  WBC 10.6k, Tm 99.2.  Transfer to [facility] was requested.   Review of Systems: Complains of foot pain and tenderness, oriented to self and location.  Waxing and waning mental status.  Past Medical History:  Diagnosis Date  . Anginal pain (HCC)    before CABG  . Arthritis   . CHF (congestive heart failure) (HCC)   . Constipation   . Coronary artery disease   . Diabetes mellitus   . ESRD (end stage renal disease) (HCC)    TuesThurs Sat [place]  . Hypertension   . Myocardial infarction (HCC)   . Pneumonia    2 years ago    Past Surgical History:  Procedure Laterality Date  . A/V FISTULAGRAM Left 10/31/2016   Procedure: A/V Fistulagram;  Surgeon: Maeola Harman, MD;  Location: [facility] INVASIVE CV LAB;  Service: Cardiovascular;  Laterality: Left;  . ABDOMINAL AORTOGRAM W/LOWER EXTREMITY N/A  01/03/2017   Procedure: ABDOMINAL AORTOGRAM W/LOWER EXTREMITY;  Surgeon: Fransisco Hertz, MD;  Location: [facility] INVASIVE CV LAB;  Service: Cardiovascular;  Laterality: N/A;  . ABDOMINAL AORTOGRAM W/LOWER EXTREMITY N/A 02/06/2017   Procedure: ABDOMINAL AORTOGRAM W/LOWER EXTREMITY;  Surgeon: Maeola Harman, MD;  Location: [facility] INVASIVE CV LAB;  Service: Cardiovascular;  Laterality: N/A;  . ARTERIOVENOUS GRAFT PLACEMENT Left   . BACK SURGERY    . CORONARY ARTERY BYPASS GRAFT    . LAPAROSCOPIC COLON RESECTION     growth  . PERIPHERAL VASCULAR BALLOON ANGIOPLASTY Right 02/06/2017   Procedure: PERIPHERAL VASCULAR BALLOON ANGIOPLASTY;  Surgeon: Maeola Harman, MD;  Location: [facility] INVASIVE CV LAB;  Service: Cardiovascular;  Laterality: Right;  Anterior tibial  . REVISION OF ARTERIOVENOUS GORETEX GRAFT Left 11/16/2016   Procedure: REVISION OF ARTERIOVENOUS GORETEX GRAFT;  Surgeon: Chuck Hint, MD;  Location: [facility] OR;  Service: Vascular;  Laterality: Left;  . REVISON OF ARTERIOVENOUS FISTULA Left 09/11/2013   Procedure: REVISON OF ARTERIOVENOUS FISTULA-LEFT ARM; POSSIBLE VENOPLASTY WITH INTEROPERATIVE FISTULOGRAM;  Surgeon: Chuck Hint, MD;  Location: [facility] OR;  Service: Vascular;  Laterality: Left;     reports that he has never smoked. His smokeless tobacco use includes Snuff. He reports that he does not drink alcohol or use drugs.  Allergies  Allergen Reactions  . Iohexol Swelling and Rash  . Zanaflex [Tizanidine] Other (  See Comments)    Shut kidneys down Unable to walk or void  . Ezetimibe-Simvastatin Other (See Comments)    Elevated liver enzymes  . Fluvastatin Other (See Comments)    Elevated liver enzymes  . Simvastatin Other (See Comments)    Elevated liver enzymes    Family History  Problem Relation Age of Onset  . Cancer Father   . Diabetes Sister      Prior to Admission medications   Medication Sig Start Date End Date Taking? Authorizing Provider    acetaminophen (TYLENOL) 325 MG tablet Take 650 mg by mouth every 6 (six) hours as needed for mild pain.    [provider]  allopurinol (ZYLOPRIM) 100 MG tablet Take 100 mg by mouth every evening.     [provider]  aspirin EC 81 MG tablet Take 81 mg by mouth every evening.     [provider]  diphenhydrAMINE (BENADRYL) 25 mg capsule Take 25 mg by mouth every 6 (six) hours as needed for allergies.    [provider]  Oxycodone HCl 10 MG TABS Take 10 mg by mouth every 4 (four) hours as needed (pain).    [provider]  sucroferric oxyhydroxide (VELPHORO) 500 MG chewable tablet Chew 1,000 mg by mouth 3 (three) times daily with meals.     [provider]  tuberculin 5 UNIT/0.1ML injection Inject 5 Units into the skin once. 2nd step on 01/26/17    [provider]  vitamin B-12 (CYANOCOBALAMIN) 250 MCG tablet Take 250 mcg by mouth every evening.     [provider]    Physical Exam: Vitals:   02/10/17 0120  BP: (!) 155/55  Temp: 98.6 F (37 C)  TempSrc: Oral  SpO2: 100%  Weight: 97.4 kg (214 lb 11.2 oz)  Height:  (1.778 m)    Constitutional: NAD, calm, comfortable Eyes: PERRL, lids and conjunctivae normal ENMT: Mucous membranes are moist. Posterior pharynx clear of any exudate or lesions.Normal dentition.  Neck: normal, supple, no masses, no thyromegaly Respiratory: clear to auscultation bilaterally, no wheezing, no crackles. Normal respiratory effort. No accessory muscle use.  Cardiovascular: Regular rate and rhythm, no murmurs / rubs / gallops. No extremity edema. 2+ pedal pulses. No carotid bruits.  Abdomen: no tenderness, no masses palpated. No hepatosplenomegaly. Bowel sounds positive.  Musculoskeletal: no clubbing / cyanosis. No joint deformity upper and lower extremities. Good ROM, no contractures. Normal muscle tone.  Skin: no rashes, lesions, ulcers. No induration Neurologic: CN 2-12 grossly intact.  Sensation intact, DTR normal. Strength 5/5 in all 4.  Psychiatric: Normal judgment and insight. Alert and oriented x 3. Normal mood.    Labs on Admission: I have personally reviewed following labs and imaging studies  CBC:  Recent Labs Lab 02/06/17 1328  HGB 10.9*  HCT 32.0*   Basic Metabolic Panel:  Recent Labs Lab 02/06/17 1328  NA 134*  K 4.8  CL 95*  GLUCOSE 88  BUN 43*  CREATININE 11.30*   GFR: Estimated Creatinine Clearance: 6.2 mL/min (A) (by C-G formula based on SCr of 11.3 mg/dL (H)). Liver Function Tests: No results for input(s): AST, ALT, ALKPHOS, BILITOT, PROT, ALBUMIN in the last 168 hours. No results for input(s): LIPASE, AMYLASE in the last 168 hours. No results for input(s): AMMONIA in the last 168 hours. Coagulation Profile: No results for input(s): INR, PROTIME in the last 168 hours. Cardiac Enzymes: No results for input(s): CKTOTAL, CKMB, CKMBINDEX, TROPONINI in the last 168 hours. BNP (  last 3 results) No results for input(s): PROBNP in the last 8760 hours. HbA1C: No results for input(s): HGBA1C in the last 72 hours. CBG:  Recent Labs Lab 02/06/17 1145  GLUCAP 102*   Lipid Profile: No results for input(s): CHOL, HDL, LDLCALC, TRIG, CHOLHDL, LDLDIRECT in the last 72 hours. Thyroid Function Tests: No results for input(s): TSH, T4TOTAL, FREET4, T3FREE, THYROIDAB in the last 72 hours. Anemia Panel: No results for input(s): VITAMINB12, FOLATE, FERRITIN, TIBC, IRON, RETICCTPCT in the last 72 hours. Urine analysis:    Component Value Date/Time   COLORURINE YELLOW 12/26/2016 0024   APPEARANCEUR CLOUDY (A) 12/26/2016 0024   LABSPEC 1.016 12/26/2016 0024   PHURINE 8.0 12/26/2016 0024   GLUCOSEU NEGATIVE 12/26/2016 0024   HGBUR SMALL (A) 12/26/2016 0024   BILIRUBINUR NEGATIVE 12/26/2016 0024   KETONESUR NEGATIVE 12/26/2016 0024   PROTEINUR 100 (A) 12/26/2016 0024   UROBILINOGEN 0.2 03/08/2011 0253   NITRITE NEGATIVE 12/26/2016 0024    LEUKOCYTESUR LARGE (A) 12/26/2016 0024    Radiological Exams on Admission: No results found.  EKG: Independently reviewed.  Assessment/Plan Principal Problem:   Gangrene of foot (HCC) Active Problems:   End stage renal disease (HCC)   Hypertension   Acute metabolic encephalopathy   Diabetes mellitus with complication (HCC)    1. Gangrene of foot - R>L though toes 2-4 on L are gangrenous too. 1. Consult vascular or ortho surgery in AM 2. Current plan is for AKA 3. Monitor for development of SIRS.  Appears to be primarily dry gangrene at this point. 4. Wound care 2. Acute metabolic encephalopathy - delirium 1. Gangrene, pain, and pain meds likely playing a role 2. However patient noted to have a bicarb of 34 today on BMP at [facility] which is currently unexplained 3. Obtain ABG 4. CBC/BMP repeat this morning 3. ESRD - left message with nephrology for routine inpatient dialysis 4. HTN - currently on no meds it appears, continue to monitor and treat if needed 5. DM2 - currently on no meds it appears, CBG checks AC/HS 6. ? Positioning of central line - X ray from [place] suggest malpositioning.  CXR ordered STAT, and told RN not to use until we can confirm that its been repositioned on imaging.  If still malpositioned.  DVT prophylaxis: Heparin [place] Code Status: Full Family Communication: No family in room Disposition Plan: SNF after admit Consults called: Call vascular surgery or ortho in AM Admission status: Admit to inpatient   Hillary Bow DO [facility] Pager [phone]  If 7AM-7PM, please contact day team taking care of patient [email or web address] Password [email or web address]  02/10/2017, 2:57 AM

## 2017-02-10 NOTE — Consult Note (Signed)
Vascular and Vein Specialist of   Patient name: [name] MRN: [id number] DOB: Jul 07, 1935 Sex: male   REQUESTING PROVIDER:    Hospitalists   REASON FOR CONSULT:    Gangrene to both feet  HISTORY OF PRESENT ILLNESS:   [name] is a 81 y.o. male, who Was admitted on 02/10/2017 for altered mental status.  He has a history of end-stage renal disease on dialysis Tuesday Thursday Saturday.  He is medically managed for hypertension.  He is a diabetic.  He has undergone angiography by Dr. Randie Heinz.  Pedal access was attempted.  The lesion was not able to be repaired.  Above-knee amputation on the right has been recommended.  Previously, the patient was not interested in amputation.  PAST MEDICAL HISTORY    Past Medical History:  Diagnosis Date  . Anginal pain (HCC)    before CABG  . Arthritis   . CHF (congestive heart failure) (HCC)   . Constipation   . Coronary artery disease   . Diabetes mellitus   . ESRD (end stage renal disease) (HCC)    TuesThurs Sat [place]  . Hypertension   . Myocardial infarction (HCC)   . Pneumonia    2 years ago     FAMILY HISTORY   Family History  Problem Relation Age of Onset  . Cancer Father   . Diabetes Sister     SOCIAL HISTORY:   Social History   Social History  . Marital status: Married    Spouse name: N/A  . Number of children: N/A  . Years of education: N/A   Occupational History  . Not on file.   Social History Main Topics  . Smoking status: Never Smoker  . Smokeless tobacco: Current User    Types: Snuff  . Alcohol use No  . Drug use: No  . Sexual activity: Not on file   Other Topics Concern  . Not on file   Social History Narrative  . No narrative on file    ALLERGIES:    Allergies  Allergen Reactions  . Iohexol Swelling and Rash  . Zanaflex [Tizanidine] Other (See Comments)    Shut kidneys down Unable to walk or void  . Ezetimibe-Simvastatin Other (See  Comments)    Elevated liver enzymes  . Fluvastatin Other (See Comments)    Elevated liver enzymes  . Simvastatin Other (See Comments)    Elevated liver enzymes    CURRENT MEDICATIONS:    Current Facility-Administered Medications  Medication Dose Route Frequency Provider Last Rate Last Dose  . 0.9 %  sodium chloride infusion  100 mL Intravenous PRN Delano Metz, MD      . 0.9 %  sodium chloride infusion  100 mL Intravenous PRN Delano Metz, MD      . acetaminophen (TYLENOL) tablet 650 mg  650 mg Oral Q6H PRN Hillary Bow, DO       Or  . acetaminophen (TYLENOL) suppository 650 mg  650 mg Rectal Q6H PRN Hillary Bow, DO      . allopurinol (ZYLOPRIM) tablet 100 mg  100 mg Oral QPM Lyda Perone M, DO      . alteplase (CATHFLO ACTIVASE) injection 2 mg  2 mg Intracatheter Once PRN Delano Metz, MD      . aspirin EC tablet 81 mg  81 mg Oral QPM Lyda Perone M, DO      . cefTAZidime (FORTAZ) 1 g in dextrose 5 % 50 mL IVPB  1 g Intravenous  Q24H Blake Divine, RPH      . cyanocobalamin tablet 250 mcg  250 mcg Oral QPM Lyda Perone M, DO      . diphenhydrAMINE (BENADRYL) capsule 25 mg  25 mg Oral Q6H PRN Hillary Bow, DO      . heparin injection 1,000 Units  1,000 Units Dialysis PRN Delano Metz, MD      . heparin injection 5,000 Units  5,000 Units Subcutaneous Q8H Lyda Perone M, DO   5,000 Units at 02/10/17 0541  . [START ON 02/11/2017] heparin injection 7,000 Units  7,000 Units Dialysis Once in dialysis Delano Metz, MD      . lidocaine (PF) (XYLOCAINE) 1 % injection 5 mL  5 mL Intradermal PRN Delano Metz, MD      . lidocaine-prilocaine (EMLA) cream 1 application  1 application Topical PRN Delano Metz, MD      . ondansetron [facility]) tablet 4 mg  4 mg Oral Q6H PRN Hillary Bow, DO       Or  . ondansetron [facility]) injection 4 mg  4 mg Intravenous Q6H PRN Hillary Bow, DO      . oxyCODONE (Oxy IR/ROXICODONE) immediate release tablet 10 mg  10 mg  Oral Q4H PRN Hillary Bow, DO      . pentafluoroprop-tetrafluoroeth (GEBAUERS) aerosol 1 application  1 application Topical PRN Delano Metz, MD      . sodium chloride flush (NS) 0.9 % injection 10-40 mL  10-40 mL Intracatheter PRN Hillary Bow, DO      . sucroferric oxyhydroxide [facility]) chewable tablet 1,000 mg  1,000 mg Oral TID WC Hillary Bow, DO      . vancomycin (VANCOCIN) 2,000 mg in sodium chloride 0.9 % 500 mL IVPB  2,000 mg Intravenous Once Blake Divine, RPH        REVIEW OF SYSTEMS:    denotes positive finding,  denotes negative finding Cardiac  Comments:  Chest pain or chest pressure:    Shortness of breath upon exertion:    Short of breath when lying flat:    Irregular heart rhythm:        Vascular    Pain in calf, thigh, or hip brought on by ambulation:    Pain in feet at night that wakes you up from your sleep:     Blood clot in your veins:    Leg swelling:  x       Pulmonary    Oxygen at home:    Productive cough:     Wheezing:         Neurologic    Sudden weakness in arms or legs:     Sudden numbness in arms or legs:     Sudden onset of difficulty speaking or slurred speech:    Temporary loss of vision in one eye:     Problems with dizziness:         Gastrointestinal    Blood in stool:      Vomited blood:         Genitourinary    Burning when urinating:     Blood in urine:        Psychiatric    Major depression:         Hematologic    Bleeding problems:    Problems with blood clotting too easily:        Skin    Rashes or ulcers: x       Constitutional  Fever or chills:     PHYSICAL EXAM:   Vitals:   02/10/17 0120 02/10/17 0145 02/10/17 0541  BP: (!) 155/55 (!) 155/55 136/74  Pulse:   78  Resp:   17  Temp: 98.6 F (37 C)  98.8 F (37.1 C)  TempSrc: Oral  Oral  SpO2: 100%  100%  Weight: 214 lb 11.2 oz (97.4 kg)    Height:  (1.778 m)      GENERAL: The patient is a well-nourished male, in no acute  distress. The vital signs are documented above. CARDIAC: There is a regular rate and rhythm.  VASCULAR: Nonpalpable pedal pulses PULMONARY: Nonlabored respirations ABDOMEN: Soft and non-tender with normal pitched bowel sounds.  MUSCULOSKELETAL: There are no major deformities or cyanosis. NEUROLOGIC: No focal weakness or paresthesias are detected. SKIN: Open wounds to the toes of both feet, right greater than left PSYCHIATRIC: The patient has a normal affect.  STUDIES:   None  ASSESSMENT and PLAN   Infected and ischemic toes bilaterally, right greater than left.  The patient was unable to have a conversation today.  I will need to speak with his family regarding definitive treatment.  Right above-knee amputation has been recommended.  Until consent has been obtained for the procedure, I would recommend continuing with IV antibiotics.  We will continue to follow the patient closely.   Durene Cal, MD [facility] [phone] Pager [phone]

## 2017-02-10 NOTE — Progress Notes (Signed)
Hypoglycemic Event  CBG: Results for ANTWOIN, LACKEY (MRN [id number]) as of 02/10/2017 23:00  Ref. Range 02/10/2017 20:23  Glucose-Capillary Latest Ref Range: 65 - 99 mg/dL 62 (L)    Treatment: U13 IV 50 mL  Symptoms: None  Follow-up CBG: Time: CBG Result Results for JERRELLE, MICHELSEN (MRN [id number]) as of 02/10/2017 23:01  Ref. Range 02/10/2017 21:09  Glucose-Capillary Latest Ref Range: 65 - 99 mg/dL 97    Possible Reasons for Event: Inadequate meal intake  Comments/MD notified:    Carmin Muskrat

## 2017-02-10 NOTE — Progress Notes (Signed)
Patient a 81 yrs old [place] American male ,a direct admission from [facility] with bilateral  dry gangrenous feet. Pt alert but confused. Follows simple command. Connected to the monitor and CC MT notified . Pt re oriented to Nsg,  Staff, room and use of call bell . Initial assessment, done vs stable,MD Gardner at bedside to  see pt.

## 2017-02-10 NOTE — Progress Notes (Signed)
PROGRESS NOTE  [name]  ZOX:[id number] DOB: 1935/05/28 DOA: 02/10/2017 PCP: Crist Fat, MD  Brief Narrative:   The patient is a 81 yo male with history of ESRD dialysis TTS, HTN, MI, PAD, DM2.  Patient was recently admitted and discharged last month with cellulitis of the RLE foot.  Due to gangrene he had follow up cath with Dr. Randie Heinz on 9/19.  They were unable to re-vascularize the R foot.  R AKA was recommended.  Patient was supposed to go to [facility] prior to this but he refused to go.  In the last couple of days, patient had increasing weakness and lethargy so his family brought him to the [facility] ED.  He was transferred to [facility] for Vascular Surgery consultation.  I have paged Dr. Myra Gianotti who will consult either today or tomorrow.    Assessment & Plan:   Principal Problem:   Gangrene of foot (HCC) Active Problems:   End stage renal disease (HCC)   Hypertension   Acute metabolic encephalopathy   Diabetes mellitus with complication (HCC)  Gangrene of the bilateral feet, right greater than left.   -  Vascular surgery consultation -  Per Dr. Darcella Cheshire last note, planning for AKA on the right -  No fevers, WBC normal currently, but feet are erythematous and right foot second toe quite swollen -  Start broad spectrum antibiotics  Acute metabolic encephalopathy, likely due to gangrene -  Starting antibiotics  ESRD -  message left with nephrology for routine inpatient dialysis  Hypertension, currently on no medications  Diabetes mellitus type 2, on no medications -  Patient refusing CBG/insulin  Right neck central line (patient refused to let me examine this morning) -  CXR overnight shows it is Dayle Mcnerney but the tip is no longer twisted -  Okay to use for any medications that would run through a PIV as long as it is flushing well  DVT prophylaxis:  heparin Code Status:  Full > given patient's refusal of medications and procedures, palliative care consult to  establish GOC may be helpful Family Communication:  Patient alone Disposition Plan:  Pending amputation   Consultants:   Vascular surgery, Dr. Myra Gianotti  Procedures:  none  Antimicrobials:  Anti-infectives    None       Subjective: Denies chest pain, SOB, nausea.  Refused to answer further questions.  Mumbles.    Objective: Vitals:   02/10/17 0120 02/10/17 0145 02/10/17 0541  BP: (!) 155/55 (!) 155/55 136/74  Pulse:   78  Resp:   17  Temp: 98.6 F (37 C)  98.8 F (37.1 C)  TempSrc: Oral  Oral  SpO2: 100%  100%  Weight: 97.4 kg (214 lb 11.2 oz)    Height:  (1.778 m)      Intake/Output Summary (Last 24 hours) at 02/10/17 0759 Last data filed at 02/10/17 0600  Gross per 24 hour  Intake                0 ml  Output                0 ml  Net                0 ml   Filed Weights   02/10/17 0120  Weight: 97.4 kg (214 lb 11.2 oz)    Examination:  Did not assist me with exam and refused to turn to allow me to look at central line.  General exam:  Adult male, lying on right side, eyes closed.  No acute distress.  HEENT:  NCAT, MMM Respiratory system: Clear to auscultation bilaterally Cardiovascular system: Regular rate and rhythm, normal S1/S2. No murmurs, rubs, gallops or clicks.  Warm extremities Gastrointestinal system: Normal active bowel sounds, soft, nondistended, nontender. MSK:  Normal tone and bulk, 2+ pitting bilateral lower extremity edema.  Right foot:  Absent pedal pulse.  Tip of 1st toe is dark, 2nd toe has denuded skin and is warm and swollen, 3-5 toes have dry gangrene.  Heel of right foot has darkened area with denuded skin.  Left foot:  Absent pedal pulse.  Toes 2-4 with denuded skin on dorsum with apparent dry gangrene.  Both feet are mildly erythematous, slightly warm and swollen.   Neuro:  Grossly moves all extremities    Data Reviewed: I have personally reviewed following labs and imaging studies  CBC:  Recent Labs Lab 02/06/17 1328  02/10/17 0403  WBC  --  10.2  HGB 10.9* 9.6*  HCT 32.0* 30.1*  MCV  --  94.7  PLT  --  282   Basic Metabolic Panel:  Recent Labs Lab 02/06/17 1328 02/10/17 0403  NA 134* 136  K 4.8 4.1  CL 95* 91*  CO2  --  31  GLUCOSE 88 69  BUN 43* 33*  CREATININE 11.30* 9.43*  CALCIUM  --  8.6*   GFR: Estimated Creatinine Clearance: 7.4 mL/min (A) (by C-G formula based on SCr of 9.43 mg/dL (H)). Liver Function Tests:  Recent Labs Lab 02/10/17 0403  AST 26  ALT 27  ALKPHOS 114  BILITOT 0.8  PROT 6.6  ALBUMIN 2.7*   No results for input(s): LIPASE, AMYLASE in the last 168 hours. No results for input(s): AMMONIA in the last 168 hours. Coagulation Profile: No results for input(s): INR, PROTIME in the last 168 hours. Cardiac Enzymes: No results for input(s): CKTOTAL, CKMB, CKMBINDEX, TROPONINI in the last 168 hours. BNP (last 3 results) No results for input(s): PROBNP in the last 8760 hours. HbA1C: No results for input(s): HGBA1C in the last 72 hours. CBG:  Recent Labs Lab 02/06/17 1145 02/10/17 0536 02/10/17 0633  GLUCAP 102* 67 100*   Lipid Profile: No results for input(s): CHOL, HDL, LDLCALC, TRIG, CHOLHDL, LDLDIRECT in the last 72 hours. Thyroid Function Tests: No results for input(s): TSH, T4TOTAL, FREET4, T3FREE, THYROIDAB in the last 72 hours. Anemia Panel: No results for input(s): VITAMINB12, FOLATE, FERRITIN, TIBC, IRON, RETICCTPCT in the last 72 hours. Urine analysis:    Component Value Date/Time   COLORURINE YELLOW 12/26/2016 0024   APPEARANCEUR CLOUDY (A) 12/26/2016 0024   LABSPEC 1.016 12/26/2016 0024   PHURINE 8.0 12/26/2016 0024   GLUCOSEU NEGATIVE 12/26/2016 0024   HGBUR SMALL (A) 12/26/2016 0024   BILIRUBINUR NEGATIVE 12/26/2016 0024   KETONESUR NEGATIVE 12/26/2016 0024   PROTEINUR 100 (A) 12/26/2016 0024   UROBILINOGEN 0.2 03/08/2011 0253   NITRITE NEGATIVE 12/26/2016 0024   LEUKOCYTESUR LARGE (A) 12/26/2016 0024   Sepsis  Labs: (procalcitonin:4,lacticidven:4)  ) Recent Results (from the past 240 hour(s))  MRSA PCR Screening     Status: Abnormal   Collection Time: 02/10/17  3:33 AM  Result Value Ref Range Status   MRSA by PCR POSITIVE (A) NEGATIVE Final    Comment:        The GeneXpert MRSA Assay (FDA approved for NASAL specimens only), is one component of a comprehensive MRSA colonization surveillance program. It is not intended to  diagnose MRSA infection nor to guide or monitor treatment for MRSA infections. RESULT CALLED TO, READ BACK BY AND VERIFIED WITH: IRBY,T RN [phone] AT [phone] [facility]       Radiology Studies: Dg Chest Port 1 View  Result Date: 02/10/2017 CLINICAL DATA:  Right PICC placement EXAM: PORTABLE CHEST 1 VIEW COMPARISON:  02/09/2017 FINDINGS: Right-sided central venous catheter tip has been straightened, it probably projects over the distal jugular confluence allowing for patient rotation. Post sternotomy changes. Linear atelectasis at the left lung base. Stable cardiomediastinal silhouette. IMPRESSION: Right-sided central venous catheter tip has been straightened, tip suspected to project over distal jugular venous confluence allowing for markedly rotated patient. No change in cardiomegaly and bibasilar atelectasis Electronically Signed   By: Jasmine Pang M.D.   On: 02/10/2017 03:39     Scheduled Meds: . allopurinol  100 mg Oral QPM  . aspirin EC  81 mg Oral QPM  . cyanocobalamin  250 mcg Oral QPM  . heparin  5,000 Units Subcutaneous Q8H  . sucroferric oxyhydroxide  1,000 mg Oral TID WC   Continuous Infusions:   LOS: 0 days    Time spent: 30 min    Renae Fickle, MD [facility] Pager [phone]  If 7PM-7AM, please contact night-coverage [email or web address] Password [email or web address] 02/10/2017, 7:59 AM

## 2017-02-10 NOTE — Progress Notes (Signed)
Hypoglycemic Event  CBG: 67  Treatment: 1/2 amp D 50 given  Symptoms confusion vs stable  Follow-up CBG: Time:0536 CBG 67                                                                Possible Reason :PT refused to take PO . 1/2 AMP D 50  Given at 0600 CBG result  Now 100  Comments/MD notified: Julius Bowels

## 2017-02-11 DIAGNOSIS — L8990 Pressure ulcer of unspecified site, unspecified stage: Secondary | ICD-10-CM | POA: Insufficient documentation

## 2017-02-11 LAB — CBC
HCT: 30.1 % — ABNORMAL LOW (ref 39.0–52.0)
Hemoglobin: 9.7 g/dL — ABNORMAL LOW (ref 13.0–17.0)
MCH: 30.2 pg (ref 26.0–34.0)
MCHC: 32.2 g/dL (ref 30.0–36.0)
MCV: 93.8 fL (ref 78.0–100.0)
Platelets: 267 10*3/uL (ref 150–400)
RBC: 3.21 MIL/uL — ABNORMAL LOW (ref 4.22–5.81)
RDW: 15 % (ref 11.5–15.5)
WBC: 10.2 10*3/uL (ref 4.0–10.5)

## 2017-02-11 LAB — RENAL FUNCTION PANEL
Albumin: 2.4 g/dL — ABNORMAL LOW (ref 3.5–5.0)
Anion gap: 15 (ref 5–15)
BUN: 42 mg/dL — ABNORMAL HIGH (ref 6–20)
CO2: 29 mmol/L (ref 22–32)
Calcium: 8.4 mg/dL — ABNORMAL LOW (ref 8.9–10.3)
Chloride: 94 mmol/L — ABNORMAL LOW (ref 101–111)
Creatinine, Ser: 11.49 mg/dL — ABNORMAL HIGH (ref 0.61–1.24)
GFR calc Af Amer: 4 mL/min — ABNORMAL LOW (ref >60)
GFR calc non Af Amer: 4 mL/min — ABNORMAL LOW (ref >60)
Glucose, Bld: 75 mg/dL (ref 65–99)
Phosphorus: 5.4 mg/dL — ABNORMAL HIGH (ref 2.5–4.6)
Potassium: 4.4 mmol/L (ref 3.5–5.1)
Sodium: 138 mmol/L (ref 135–145)

## 2017-02-11 LAB — GLUCOSE, CAPILLARY
GLUCOSE-CAPILLARY: 71 mg/dL (ref 65–99)
GLUCOSE-CAPILLARY: 90 mg/dL (ref 65–99)

## 2017-02-11 MED ORDER — DEXTROSE 5 % IV SOLN
1.5000 g | INTRAVENOUS | Status: AC
  Administered 2017-02-14: 1.5 g via INTRAVENOUS
  Filled 2017-02-11 (×2): qty 1.5

## 2017-02-11 MED ORDER — LIDOCAINE-PRILOCAINE 2.5-2.5 % EX CREA: 1.0000 "application " | TOPICAL_CREAM | CUTANEOUS | Status: DC | PRN

## 2017-02-11 MED ORDER — HEPARIN SODIUM (PORCINE) 1000 UNIT/ML DIALYSIS: 1000.0000 [IU] | INTRAMUSCULAR | Status: DC | PRN

## 2017-02-11 MED ORDER — LIDOCAINE HCL (PF) 1 % IJ SOLN: 5.0000 mL | INTRAMUSCULAR | Status: DC | PRN

## 2017-02-11 MED ORDER — VANCOMYCIN HCL IN DEXTROSE 1-5 GM/200ML-% IV SOLN
INTRAVENOUS | Status: AC
  Administered 2017-02-11: 1000 mg via INTRAVENOUS
  Filled 2017-02-11: qty 200

## 2017-02-11 MED ORDER — PENTAFLUOROPROP-TETRAFLUOROETH EX AERO: 1.0000 "application " | INHALATION_SPRAY | CUTANEOUS | Status: DC | PRN

## 2017-02-11 MED ORDER — CHLORHEXIDINE GLUCONATE 4 % EX LIQD
60.0000 mL | Freq: Once | CUTANEOUS | Status: DC
  Filled 2017-02-11: qty 60

## 2017-02-11 MED ORDER — HEPARIN SODIUM (PORCINE) 1000 UNIT/ML DIALYSIS: 20.0000 [IU]/kg | INTRAMUSCULAR | Status: DC | PRN

## 2017-02-11 MED ORDER — CALCITRIOL 0.25 MCG PO CAPS
ORAL_CAPSULE | ORAL | Status: AC
  Filled 2017-02-11: qty 1

## 2017-02-11 MED ORDER — ALTEPLASE 2 MG IJ SOLR: 2.0000 mg | Freq: Once | INTRAMUSCULAR | Status: DC | PRN

## 2017-02-11 MED ORDER — OXYCODONE HCL 5 MG PO TABS
ORAL_TABLET | ORAL | Status: AC
  Filled 2017-02-11: qty 2

## 2017-02-11 MED ORDER — SODIUM CHLORIDE 0.9 % IV SOLN: 100.0000 mL | INTRAVENOUS | Status: DC | PRN

## 2017-02-11 MED ORDER — DEXTROSE 5 % IV SOLN
1.5000 g | INTRAVENOUS | Status: AC
  Filled 2017-02-11: qty 1.5

## 2017-02-11 MED ORDER — CHLORHEXIDINE GLUCONATE 4 % EX LIQD
60.0000 mL | Freq: Once | CUTANEOUS | Status: AC
  Administered 2017-02-12: 4 via TOPICAL
  Filled 2017-02-11: qty 60

## 2017-02-11 MED ORDER — SODIUM CHLORIDE 0.9 % IV SOLN
INTRAVENOUS | Status: DC
  Administered 2017-02-14: 12:00:00 via INTRAVENOUS

## 2017-02-11 NOTE — Procedures (Signed)
I was present at this dialysis session. I have reviewed the session itself and made appropriate changes.   Filed Weights   02/10/17 0800 02/11/17 0556 02/11/17 0800  Weight: 97.4 kg (214 lb 11.7 oz) 97.4 kg (214 lb 11.7 oz) 100.4 kg (221 lb 5.5 oz)     Recent Labs Lab 02/11/17 0816  NA 138  K 4.4  CL 94*  CO2 29  GLUCOSE 75  BUN 42*  CREATININE 11.49*  CALCIUM 8.4*  PHOS 5.4*     Recent Labs Lab 02/06/17 1328 02/10/17 0403 02/11/17 0816  WBC  --  10.2 10.2  HGB 10.9* 9.6* 9.7*  HCT 32.0* 30.1* 30.1*  MCV  --  94.7 93.8  PLT  --  282 267    Scheduled Meds: . allopurinol  100 mg Oral QPM  . aspirin EC  81 mg Oral QPM  . calcitRIOL  0.25 mcg Oral Q M,W,F-HD  . cyanocobalamin  250 mcg Oral QPM  . [START ON 02/23/2017] darbepoetin (ARANESP) injection - DIALYSIS  60 mcg Intravenous Q Sat-HD  . feeding supplement (NEPRO CARB STEADY)  237 mL Oral BID BM  . heparin  5,000 Units Subcutaneous Q8H  . heparin  7,000 Units Dialysis Once in dialysis  . multivitamin  1 tablet Oral QHS  . sucroferric oxyhydroxide  1,000 mg Oral TID WC   Continuous Infusions: . sodium chloride    . sodium chloride    . sodium chloride    . sodium chloride    . cefTAZidime (FORTAZ)  IV Stopped (02/10/17 1530)  . vancomycin     PRN Meds:.sodium chloride, sodium chloride, sodium chloride, sodium chloride, acetaminophen **OR** acetaminophen, alteplase, diphenhydrAMINE, heparin, heparin, lidocaine (PF), lidocaine-prilocaine, ondansetron **OR** ondansetron (ZOFRAN) IV, oxyCODONE, pentafluoroprop-tetrafluoroeth, sodium chloride flush    Assessment/Plan: 1. Bilateral lower extremity PAD with ischemia and also with gangrene of right foot.- VVS following and recommending amputation.  To speak with family and patient today. 2. ESRD- cont MWF 3. Anemia of ESRD- ESA and follow 4. HTN/Volume - follow with UF  Irena Cords,  MD 02/11/2017, 8:43 AM

## 2017-02-11 NOTE — Progress Notes (Signed)
Dry gangrene right foot Pt not able to consent for himself I have attempted to call pt wife no answer Called son who will try to get in touch with wife to obtain consent Will make NPO p midnight in the event we can do this tomorrow  Fabienne Bruns, MD [facility] Office: [phone] Pager: [phone]

## 2017-02-11 NOTE — Progress Notes (Signed)
PROGRESS NOTE  [name]  ZOX:[id number] DOB: 29-Mar-1936 DOA: 02/10/2017 PCP: Crist Fat, MD  Brief Narrative:   The patient is a 81 yo male with history of ESRD dialysis TTS, HTN, MI, PAD, DM2.  Patient was recently admitted and discharged last month with cellulitis of the RLE foot.  Due to gangrene he had follow up cath with Dr. Randie Heinz on 9/19.  They were unable to re-vascularize the R foot.  R AKA was recommended.  Patient was supposed to go to [facility] prior to this but he refused to go.  In the last couple of days, patient had increasing weakness and lethargy so his family brought him to the [facility] ED.  He was transferred to [facility] for Vascular Surgery consultation.  Patient's feet and mentation are marginally improved on IV antibiotics.    Assessment & Plan:   Principal Problem:   Gangrene of foot (HCC) Active Problems:   End stage renal disease (HCC)   Hypertension   Acute metabolic encephalopathy   Diabetes mellitus with complication (HCC)   Pressure injury of skin  Gangrene of the bilateral feet, right greater than left.   -  Vascular surgery assistance appreciated -  Continue vancomycin and Fortaz  Acute metabolic encephalopathy, likely due to gangrene, marginally improved today -  Continue antibiotics  ESRD -  Receiving hemodialysis on 9/24  Hypertension, currently on no medications  Diabetes mellitus type 2, on no medications -  Patient refusing CBG/insulin  Right neck central line (patient refused to let me examine this morning), site appears non-erythematous, dressing is clean -  CXR overnight shows it is Cathye Kreiter but the tip is no longer twisted -  Okay to use for any medications that would run through a PIV as long as it is flushing well  DVT prophylaxis:  heparin Code Status:  Full > given patient's refusal of medications and procedures, palliative care consult to establish GOC may be helpful Family Communication:  Patient alone Disposition  Plan:  Pending amputation   Consultants:   Vascular surgery, Dr. Myra Gianotti  Procedures:  none  Antimicrobials:  Anti-infectives    Start     Dose/Rate Route Frequency Ordered Stop   02/11/17 1200  vancomycin (VANCOCIN) IVPB 1000 mg/200 mL premix     1,000 mg 200 mL/hr over 60 Minutes Intravenous Every M-W-F (Hemodialysis) 02/10/17 1355     02/10/17 1400  vancomycin (VANCOCIN) 2,000 mg in sodium chloride 0.9 % 500 mL IVPB     2,000 mg 250 mL/hr over 120 Minutes Intravenous  Once 02/10/17 1355 02/10/17 1900   02/10/17 1400  cefTAZidime (FORTAZ) 1 g in dextrose 5 % 50 mL IVPB     1 g 100 mL/hr over 30 Minutes Intravenous Daily 02/10/17 1356     02/10/17 0930  cefTAZidime (FORTAZ) 1 g in dextrose 5 % 50 mL IVPB  Status:  Discontinued     1 g 100 mL/hr over 30 Minutes Intravenous Every 24 hours 02/10/17 0845 02/10/17 1356   02/10/17 0930  vancomycin (VANCOCIN) 2,000 mg in sodium chloride 0.9 % 500 mL IVPB  Status:  Discontinued     2,000 mg 250 mL/hr over 120 Minutes Intravenous  Once 02/10/17 0845 02/10/17 1355       Subjective:  Endorses pain in the right foot. He just had a pain pill not too long ago for the right foot pain. He denies chest pains, shortness of breath, nausea. He is difficult to understand because he mumbles.  Objective: Vitals:   02/11/17 1000 02/11/17 1030 02/11/17 1100 02/11/17 1115  BP: (!) 159/74 140/60 (!) 126/49 129/90  Pulse: 76 75 74 77  Resp:      Temp:      TempSrc:      SpO2:      Weight:      Height:        Intake/Output Summary (Last 24 hours) at 02/11/17 1125 Last data filed at 02/11/17 0900  Gross per 24 hour  Intake              170 ml  Output                0 ml  Net              170 ml   Filed Weights   02/10/17 0800 02/11/17 0556 02/11/17 0800  Weight: 97.4 kg (214 lb 11.7 oz) 97.4 kg (214 lb 11.7 oz) 100.4 kg (221 lb 5.5 oz)    Examination:  General exam:  Adult male, lying in bed during hemodialysis, awake alert HEENT:   NCAT, MMM.  Right neck PICC line appears intact, no surrounding erythema Respiratory system: Clear to auscultation bilaterally Cardiovascular system: Regular rate and rhythm, normal S1/S2. No murmurs, rubs, gallops or clicks.  Warm extremities Gastrointestinal system: Normal active bowel sounds, soft, nondistended, nontender. MSK:  2+ bilateral lower extremity edema. The right foot has more clearly demarcated dry gangrene of toes 3 through 5. The dorsum of the foot is less swollen and less erythematous. The second toe remains quite swollen with denuded skin. Left foot is less swollen, less erythematous, less warm to touch. No clear evidence of demarcated gangrene of the left foot although the skin over toes 2-4 remains denuded. Neuro:  Grossly moves all extremities    Data Reviewed: I have personally reviewed following labs and imaging studies  CBC:  Recent Labs Lab 02/06/17 1328 02/10/17 0403 02/11/17 0816  WBC  --  10.2 10.2  HGB 10.9* 9.6* 9.7*  HCT 32.0* 30.1* 30.1*  MCV  --  94.7 93.8  PLT  --  282 267   Basic Metabolic Panel:  Recent Labs Lab 02/06/17 1328 02/10/17 0403 02/11/17 0816  NA 134* 136 138  K 4.8 4.1 4.4  CL 95* 91* 94*  CO2  --  31 29  GLUCOSE 88 69 75  BUN 43* 33* 42*  CREATININE 11.30* 9.43* 11.49*  CALCIUM  --  8.6* 8.4*  PHOS  --   --  5.4*   GFR: Estimated Creatinine Clearance: 6.2 mL/min (A) (by C-G formula based on SCr of 11.49 mg/dL (H)). Liver Function Tests:  Recent Labs Lab 02/10/17 0403 02/11/17 0816  AST 26  --   ALT 27  --   ALKPHOS 114  --   BILITOT 0.8  --   PROT 6.6  --   ALBUMIN 2.7* 2.4*   No results for input(s): LIPASE, AMYLASE in the last 168 hours. No results for input(s): AMMONIA in the last 168 hours. Coagulation Profile: No results for input(s): INR, PROTIME in the last 168 hours. Cardiac Enzymes: No results for input(s): CKTOTAL, CKMB, CKMBINDEX, TROPONINI in the last 168 hours. BNP (last 3 results) No results  for input(s): PROBNP in the last 8760 hours. HbA1C: No results for input(s): HGBA1C in the last 72 hours. CBG:  Recent Labs Lab 02/10/17 0633 02/10/17 2023 02/10/17 2109 02/11/17 0135 02/11/17 0551  GLUCAP 100* 62* 97 71 90   Lipid  Profile: No results for input(s): CHOL, HDL, LDLCALC, TRIG, CHOLHDL, LDLDIRECT in the last 72 hours. Thyroid Function Tests: No results for input(s): TSH, T4TOTAL, FREET4, T3FREE, THYROIDAB in the last 72 hours. Anemia Panel: No results for input(s): VITAMINB12, FOLATE, FERRITIN, TIBC, IRON, RETICCTPCT in the last 72 hours. Urine analysis:    Component Value Date/Time   COLORURINE YELLOW 12/26/2016 0024   APPEARANCEUR CLOUDY (A) 12/26/2016 0024   LABSPEC 1.016 12/26/2016 0024   PHURINE 8.0 12/26/2016 0024   GLUCOSEU NEGATIVE 12/26/2016 0024   HGBUR SMALL (A) 12/26/2016 0024   BILIRUBINUR NEGATIVE 12/26/2016 0024   KETONESUR NEGATIVE 12/26/2016 0024   PROTEINUR 100 (A) 12/26/2016 0024   UROBILINOGEN 0.2 03/08/2011 0253   NITRITE NEGATIVE 12/26/2016 0024   LEUKOCYTESUR LARGE (A) 12/26/2016 0024   Sepsis Labs: (procalcitonin:4,lacticidven:4)  ) Recent Results (from the past 240 hour(s))  MRSA PCR Screening     Status: Abnormal   Collection Time: 02/10/17  3:33 AM  Result Value Ref Range Status   MRSA by PCR POSITIVE (A) NEGATIVE Final    Comment:        The GeneXpert MRSA Assay (FDA approved for NASAL specimens only), is one component of a comprehensive MRSA colonization surveillance program. It is not intended to diagnose MRSA infection nor to guide or monitor treatment for MRSA infections. RESULT CALLED TO, READ BACK BY AND VERIFIED WITH: IRBY,T RN [phone] AT [phone] [facility]       Radiology Studies: Dg Chest Port 1 View  Result Date: 02/10/2017 CLINICAL DATA:  Right PICC placement EXAM: PORTABLE CHEST 1 VIEW COMPARISON:  02/09/2017 FINDINGS: Right-sided central venous catheter tip has been straightened, it probably  projects over the distal jugular confluence allowing for patient rotation. Post sternotomy changes. Linear atelectasis at the left lung base. Stable cardiomediastinal silhouette. IMPRESSION: Right-sided central venous catheter tip has been straightened, tip suspected to project over distal jugular venous confluence allowing for markedly rotated patient. No change in cardiomegaly and bibasilar atelectasis Electronically Signed   By: Jasmine Pang M.D.   On: 02/10/2017 03:39     Scheduled Meds: . allopurinol  100 mg Oral QPM  . aspirin EC  81 mg Oral QPM  . calcitRIOL  0.25 mcg Oral Q M,W,F-HD  . cyanocobalamin  250 mcg Oral QPM  . [START ON 02/23/2017] darbepoetin (ARANESP) injection - DIALYSIS  60 mcg Intravenous Q Sat-HD  . feeding supplement (NEPRO CARB STEADY)  237 mL Oral BID BM  . heparin  5,000 Units Subcutaneous Q8H  . heparin  7,000 Units Dialysis Once in dialysis  . multivitamin  1 tablet Oral QHS  . sucroferric oxyhydroxide  1,000 mg Oral TID WC   Continuous Infusions: . sodium chloride    . sodium chloride    . sodium chloride    . sodium chloride    . cefTAZidime (FORTAZ)  IV Stopped (02/10/17 1530)  . vancomycin 1,000 mg (02/11/17 1113)     LOS: 1 day    Time spent: 30 min    Renae Fickle, MD [facility] Pager [phone]  If 7PM-7AM, please contact night-coverage [email or web address] Password [facility] 02/11/2017, 11:25 AM

## 2017-02-11 NOTE — Progress Notes (Signed)
Got in contact with the patients wife. Patients wifes number is [phone].

## 2017-02-12 ENCOUNTER — Encounter (HOSPITAL_COMMUNITY): Payer: Self-pay | Admitting: Anesthesiology

## 2017-02-12 ENCOUNTER — Encounter (HOSPITAL_COMMUNITY)
Admission: AD | Disposition: A | Discharge status: Skilled Nursing Facility | Payer: Self-pay | Source: Other Acute Inpatient Hospital | Attending: Family Medicine

## 2017-02-12 ENCOUNTER — Inpatient Hospital Stay (HOSPITAL_COMMUNITY): Admission: RE | Admit: 2017-02-12 | Payer: Medicare Other | Source: Ambulatory Visit | Admitting: Vascular Surgery

## 2017-02-12 LAB — CBC
HEMATOCRIT: 30 % — AB (ref 39.0–52.0)
Hemoglobin: 9.6 g/dL — ABNORMAL LOW (ref 13.0–17.0)
MCH: 30.4 pg (ref 26.0–34.0)
MCHC: 32 g/dL (ref 30.0–36.0)
MCV: 94.9 fL (ref 78.0–100.0)
Platelets: 270 10*3/uL (ref 150–400)
RBC: 3.16 MIL/uL — AB (ref 4.22–5.81)
RDW: 15.3 % (ref 11.5–15.5)
WBC: 10.5 10*3/uL (ref 4.0–10.5)

## 2017-02-12 LAB — BASIC METABOLIC PANEL
ANION GAP: 10 (ref 5–15)
BUN: 21 mg/dL — ABNORMAL HIGH (ref 6–20)
CALCIUM: 8.6 mg/dL — AB (ref 8.9–10.3)
CO2: 29 mmol/L (ref 22–32)
Chloride: 96 mmol/L — ABNORMAL LOW (ref 101–111)
Creatinine, Ser: 7.49 mg/dL — ABNORMAL HIGH (ref 0.61–1.24)
GFR, EST AFRICAN AMERICAN: 7 mL/min — AB (ref >60)
GFR, EST NON AFRICAN AMERICAN: 6 mL/min — AB (ref >60)
Glucose, Bld: 189 mg/dL — ABNORMAL HIGH (ref 65–99)
Potassium: 3.7 mmol/L (ref 3.5–5.1)
Sodium: 135 mmol/L (ref 135–145)

## 2017-02-12 LAB — SURGICAL PCR SCREEN
MRSA, PCR: NEGATIVE
STAPHYLOCOCCUS AUREUS: NEGATIVE

## 2017-02-12 LAB — GLUCOSE, CAPILLARY
GLUCOSE-CAPILLARY: 109 mg/dL — AB (ref 65–99)
GLUCOSE-CAPILLARY: 83 mg/dL (ref 65–99)
Glucose-Capillary: 100 mg/dL — ABNORMAL HIGH (ref 65–99)
Glucose-Capillary: 90 mg/dL (ref 65–99)

## 2017-02-12 SURGERY — AMPUTATION, ABOVE KNEE
Anesthesia: General | Laterality: Right

## 2017-02-12 MED ORDER — DEXTROSE 5 % IV SOLN
2.0000 g | INTRAVENOUS | Status: AC
  Administered 2017-02-13 – 2017-02-15 (×2): 2 g via INTRAVENOUS
  Filled 2017-02-12 (×2): qty 2

## 2017-02-12 NOTE — Progress Notes (Signed)
[facility] Progress Note   Dialysis Orders:  [facility] MWF   4.25 hr 450/800 2K/2 Ca  EDW 100.5 - profile 2  LL AVGG  heparin 7500 Mircera 50 q 2 wks - last 9/22, calcitriol 0.25, Parsabiv 5  Recent labs: hgb 10.8 29% sat iPTH 1780 Ca 9.8 P 6.8  Assessment/Plan: 1. B/L lower extremity PAD w/ischemia. Gangrene of R foot.  -Followed by vascular. Amputation recommended.  -On Vanco and Fortaz  2. ESRD - MWF  -Inpatient HD planned for tomorrow. Orders written.  -HD yesterday, tolerated well. Net UF 3000, post weight 96.8kg. Below EDW will need to reassess EDW at D/c.  3. Anemia   -Hgb stable. 9.6. Received ESA in last outpatient HD treatment. Monitor. 4. Secondary hyperparathyroidism   -Continue outpatient meds. 5. HTN/volume -   -BP variable. Monitor. Titrate down volume.  6. Nutrition - Renal diet, Nepro TID, Renavite qd. 7. DM on insulin - managed as outpatient.  Virgina Norfolk, PA-C [facility] 02/12/2017,10:28 AM  LOS: 2 days   Subjective:   Patient was sleeping, easily awoken to verbal stimuli but returned to sleep following answering questions.  States he feels ok, he's tired, his legs and feet hurt.  Denies SOB, chest pain, N/V/D/C, and edema.  Objective Vitals:   02/11/17 1815 02/11/17 2056 02/12/17 0615 02/12/17 0824  BP: 122/66 (!) 118/58 126/65 (!) 158/51  Pulse: 80 77 88 84  Resp: Temp: 98 F (36.7 C) 98.4 F (36.9 C)  98.5 F (36.9 C)  TempSrc: Oral Oral Oral Oral  SpO2: 100% 100% 100% 96%  Weight:  96.7 kg (213 lb 3 oz)    Height:       Physical Exam General: NAD, sleepy, under the covers Heart: RRR, no murmur or gallops Lungs: no rales/wheezing.  CTA bilaterally. Unlabored breathing. Abdomen: soft, NT Extremities: b/l black ischemic toes, skin darkened on b/l feet and up R shin. Trace edema b/l Dialysis Access: Left lower AVGG    Intake/Output Summary (Last 24 hours) at 02/12/17 1039 Last data filed at  02/12/17 0619  Gross per 24 hour  Intake              490 ml  Output             3000 ml  Net            -2510 ml    Additional Objective Labs: Basic Metabolic Panel:  Recent Labs Lab 02/10/17 0403 02/11/17 0816 02/12/17 0003  NA 136 138 135  K 4.1 4.4 3.7  CL 91* 94* 96*  CO2 GLUCOSE 69 75 189*  BUN 33* 42* 21*  CREATININE 9.43* 11.49* 7.49*  CALCIUM 8.6* 8.4* 8.6*  PHOS  --  5.4*  --    Liver Function Tests:  Recent Labs Lab 02/10/17 0403 02/11/17 0816  AST 26  --   ALT 27  --   ALKPHOS 114  --   BILITOT 0.8  --   PROT 6.6  --   ALBUMIN 2.7* 2.4*   No results for input(s): LIPASE, AMYLASE in the last 168 hours. CBC:  Recent Labs Lab 02/10/17 0403 02/11/17 0816 02/12/17 0003  WBC 10.2 10.2 10.5  HGB 9.6* 9.7* 9.6*  HCT 30.1* 30.1* 30.0*  MCV 94.7 93.8 94.9  PLT 282 267 270   Blood Culture    Component Value Date/Time   SDES BLOOD AVG 01/01/2017 1425   SPECREQUEST  01/01/2017 1425    BOTTLES DRAWN AEROBIC AND ANAEROBIC Blood Culture results may not be optimal due to an excessive volume of blood received in culture bottles   CULT NO GROWTH 5 DAYS 01/01/2017 1425   REPTSTATUS 01/06/2017 FINAL 01/01/2017 1425    CBG:  Recent Labs Lab 02/10/17 2023 02/10/17 2109 02/11/17 0135 02/11/17 0551 02/12/17 0851  GLUCAP 62* 97 71 90 109*   Iron Studies: No results for input(s): IRON, TIBC, TRANSFERRIN, FERRITIN in the last 72 hours. Lab Results  Component Value Date   INR 1.10 01/01/2017   INR 1.3 12/04/2008   INR 1.0 08/21/2007   Studies/Results: No results found. Medications: . sodium chloride    . cefTAZidime (FORTAZ)  IV Stopped (02/11/17 1812)  . cefUROXime (ZINACEF)  IV    . cefUROXime (ZINACEF)  IV    . vancomycin Stopped (02/11/17 1308)   . allopurinol  100 mg Oral QPM  . aspirin EC  81 mg Oral QPM  . calcitRIOL  0.25 mcg Oral Q M,W,F-HD  . chlorhexidine  60 mL Topical Once  . cyanocobalamin  250 mcg Oral QPM  .  [START ON 02/23/2017] darbepoetin (ARANESP) injection - DIALYSIS  60 mcg Intravenous Q Sat-HD  . feeding supplement (NEPRO CARB STEADY)  237 mL Oral BID BM  . heparin  5,000 Units Subcutaneous Q8H  . multivitamin  1 tablet Oral QHS  . sucroferric oxyhydroxide  1,000 mg Oral TID WC    I have seen and examined this patient and agree with plan and assessment in the above note with renal recommendations/intervention highlighted. Discussed the need for amputation.  Awaiting family to give consent. Jomarie Longs A Raeshawn Vo,MD 02/12/2017 2:50 PM

## 2017-02-12 NOTE — Progress Notes (Signed)
Son stated that patients wife should be coming to visit the patient today. Will continue to monitor.

## 2017-02-12 NOTE — Progress Notes (Signed)
Unable to get consent.  Dr Randie Heinz available if able to get consent.  Pt is on OR schedule for next week.  Fabienne Bruns, MD [facility] Office: [phone] Pager: [phone]

## 2017-02-12 NOTE — Progress Notes (Signed)
PROGRESS NOTE  [name]  ONG:[id number] DOB: July 20, 1935 DOA: 02/10/2017 PCP: Crist Fat, MD  Brief Narrative:   The patient is a 81 yo male with history of ESRD dialysis TTS, HTN, MI, PAD, DM2.  Patient was recently admitted and discharged last month with cellulitis of the RLE foot.  Due to gangrene he had follow up cath with Dr. Randie Heinz on 9/19.  They were unable to re-vascularize the R foot.  R AKA was recommended.  Patient was supposed to go to [facility] prior to this but he refused to go.  In the last couple of days, patient had increasing weakness and lethargy so his family brought him to the [facility] ED.  He was transferred to [facility] consultation.  Patient's feet and mentation are marginally improved on IV antibiotics.    Assessment & Plan:   Principal Problem:   Gangrene of foot (HCC) Active Problems:   End stage renal disease (HCC)   Hypertension   Acute metabolic encephalopathy   Diabetes mellitus with complication (HCC)   Pressure injury of skin  Gangrene of the bilateral feet, right greater than left.   -  Vascular surgery assistance appreciated -  Continue vancomycin and Fortaz  Acute metabolic encephalopathy, likely due to gangrene, marginally improved today -  Continue antibiotics  ESRD -  Nephrology consulting and patient receiving intermittent HD  Hypertension, currently on no medications  Diabetes mellitus type 2, on no medications -  Patient refusing CBG/insulin  Right neck central line (patient refused to let me examine this morning), site appears non-erythematous, dressing is clean -  CXR overnight shows it is Kaimani Clayson but the tip is no longer twisted -  D/c PICC line  DVT prophylaxis:  heparin Code Status:  Full  Family Communication:  Patient alone Disposition Plan:  Pending amputation   Consultants:   Vascular surgery  Procedures:  none  Antimicrobials:  Anti-infectives    Start     Dose/Rate Route  Frequency Ordered Stop   02/13/17 2000  cefTAZidime (FORTAZ) 2 g in dextrose 5 % 50 mL IVPB     2 g 100 mL/hr over 30 Minutes Intravenous Every M-W-F (2000) 02/12/17 1035     02/12/17 0930  cefUROXime (ZINACEF) 1.5 g in dextrose 5 % 50 mL IVPB     1.5 g 100 mL/hr over 30 Minutes Intravenous To Surgery 02/11/17 1706 02/13/17 0930   02/12/17 0926  cefUROXime (ZINACEF) 1.5 g in dextrose 5 % 50 mL IVPB     1.5 g 100 mL/hr over 30 Minutes Intravenous 30 min pre-op 02/11/17 1757     02/11/17 1200  vancomycin (VANCOCIN) IVPB 1000 mg/200 mL premix     1,000 mg 200 mL/hr over 60 Minutes Intravenous Every M-W-F (Hemodialysis) 02/10/17 1355     02/10/17 1400  vancomycin (VANCOCIN) 2,000 mg in sodium chloride 0.9 % 500 mL IVPB     2,000 mg 250 mL/hr over 120 Minutes Intravenous  Once 02/10/17 1355 02/10/17 1900   02/10/17 1400  cefTAZidime (FORTAZ) 1 g in dextrose 5 % 50 mL IVPB     1 g 100 mL/hr over 30 Minutes Intravenous Daily 02/10/17 1356 02/12/17 2359   02/10/17 0930  cefTAZidime (FORTAZ) 1 g in dextrose 5 % 50 mL IVPB  Status:  Discontinued     1 g 100 mL/hr over 30 Minutes Intravenous Every 24 hours 02/10/17 0845 02/10/17 1356   02/10/17 0930  vancomycin (VANCOCIN) 2,000 mg in sodium  chloride 0.9 % 500 mL IVPB  Status:  Discontinued     2,000 mg 250 mL/hr over 120 Minutes Intravenous  Once 02/10/17 0845 02/10/17 1355       Subjective:  Continues to have pain in the right lower extremity. Denies shortness of breath, chest pains, nausea.  Objective: Vitals:   02/11/17 1815 02/11/17 2056 02/12/17 0615 02/12/17 0824  BP: 122/66 (!) 118/58 126/65 (!) 158/51  Pulse: 80 77 88 84  Resp: Temp: 98 F (36.7 C) 98.4 F (36.9 C)  98.5 F (36.9 C)  TempSrc: Oral Oral Oral Oral  SpO2: 100% 100% 100% 96%  Weight:  96.7 kg (213 lb 3 oz)    Height:        Intake/Output Summary (Last 24 hours) at 02/12/17 1634 Last data filed at 02/12/17 1100  Gross per 24 hour  Intake               490 ml  Output                0 ml  Net              490 ml   Filed Weights   02/11/17 0800 02/11/17 1240 02/11/17 2056  Weight: 100.4 kg (221 lb 5.5 oz) 96.8 kg (213 lb 6.5 oz) 96.7 kg (213 lb 3 oz)    Examination:  General exam:  Adult Male.  No acute distress.  HEENT:  NCAT, MMM, dressing over the right neck where his PICC line has been recently removed Respiratory system: Clear to auscultation bilaterally Cardiovascular system: Regular rate and rhythm, normal S1/S2. No murmurs, rubs, gallops or clicks.  Warm extremities Gastrointestinal system: Normal active bowel sounds, soft, nondistended, nontender. MSK:  1+ bilateral lower extremity edema. The right foot has clearly demarcated dry gangrene of toes 3 through 5. The dorsum of his foot has become much less swollen and erythematous and warm. The second toe is denuded. The left foot shows evidence of dry gangrene on the top of toes 2 through 4. It also appears less warm, erythematous, and swollen.  Bilateral heels with unstageable ulcers. Neuro:  Grossly intact   Data Reviewed: I have personally reviewed following labs and imaging studies  CBC:  Recent Labs Lab 02/06/17 1328 02/10/17 0403 02/11/17 0816 02/12/17 0003  WBC  --  10.2 10.2 10.5  HGB 10.9* 9.6* 9.7* 9.6*  HCT 32.0* 30.1* 30.1* 30.0*  MCV  --  94.7 93.8 94.9  PLT  --  282 267 270   Basic Metabolic Panel:  Recent Labs Lab 02/06/17 1328 02/10/17 0403 02/11/17 0816 02/12/17 0003  NA 134* 136 138 135  K 4.8 4.1 4.4 3.7  CL 95* 91* 94* 96*  CO2  --  GLUCOSE 88 69 75 189*  BUN 43* 33* 42* 21*  CREATININE 11.30* 9.43* 11.49* 7.49*  CALCIUM  --  8.6* 8.4* 8.6*  PHOS  --   --  5.4*  --    GFR: Estimated Creatinine Clearance: 9.3 mL/min (A) (by C-G formula based on SCr of 7.49 mg/dL (H)). Liver Function Tests:  Recent Labs Lab 02/10/17 0403 02/11/17 0816  AST 26  --   ALT 27  --   ALKPHOS 114  --   BILITOT 0.8  --   PROT 6.6  --     ALBUMIN 2.7* 2.4*   No results for input(s): LIPASE, AMYLASE in the last 168 hours. No results for  input(s): AMMONIA in the last 168 hours. Coagulation Profile: No results for input(s): INR, PROTIME in the last 168 hours. Cardiac Enzymes: No results for input(s): CKTOTAL, CKMB, CKMBINDEX, TROPONINI in the last 168 hours. BNP (last 3 results) No results for input(s): PROBNP in the last 8760 hours. HbA1C: No results for input(s): HGBA1C in the last 72 hours. CBG:  Recent Labs Lab 02/10/17 2109 02/11/17 0135 02/11/17 0551 02/12/17 0851 02/12/17 1150  GLUCAP 97 71 90 109* 100*   Lipid Profile: No results for input(s): CHOL, HDL, LDLCALC, TRIG, CHOLHDL, LDLDIRECT in the last 72 hours. Thyroid Function Tests: No results for input(s): TSH, T4TOTAL, FREET4, T3FREE, THYROIDAB in the last 72 hours. Anemia Panel: No results for input(s): VITAMINB12, FOLATE, FERRITIN, TIBC, IRON, RETICCTPCT in the last 72 hours. Urine analysis:    Component Value Date/Time   COLORURINE YELLOW 12/26/2016 0024   APPEARANCEUR CLOUDY (A) 12/26/2016 0024   LABSPEC 1.016 12/26/2016 0024   PHURINE 8.0 12/26/2016 0024   GLUCOSEU NEGATIVE 12/26/2016 0024   HGBUR SMALL (A) 12/26/2016 0024   BILIRUBINUR NEGATIVE 12/26/2016 0024   KETONESUR NEGATIVE 12/26/2016 0024   PROTEINUR 100 (A) 12/26/2016 0024   UROBILINOGEN 0.2 03/08/2011 0253   NITRITE NEGATIVE 12/26/2016 0024   LEUKOCYTESUR LARGE (A) 12/26/2016 0024   Sepsis Labs: (procalcitonin:4,lacticidven:4)  ) Recent Results (from the past 240 hour(s))  MRSA PCR Screening     Status: Abnormal   Collection Time: 02/10/17  3:33 AM  Result Value Ref Range Status   MRSA by PCR POSITIVE (A) NEGATIVE Final    Comment:        The GeneXpert MRSA Assay (FDA approved for NASAL specimens only), is one component of a comprehensive MRSA colonization surveillance program. It is not intended to diagnose MRSA infection nor to guide or monitor  treatment for MRSA infections. RESULT CALLED TO, READ BACK BY AND VERIFIED WITH: IRBY,T RN [phone] AT [phone] [facility]   Surgical pcr screen     Status: None   Collection Time: 02/12/17  5:09 AM  Result Value Ref Range Status   MRSA, PCR NEGATIVE NEGATIVE Final   Staphylococcus aureus NEGATIVE NEGATIVE Final    Comment: (NOTE) The Xpert SA Assay (FDA approved for NASAL specimens in patients 44 years of age and older), is one component of a comprehensive surveillance program. It is not intended to diagnose infection nor to guide or monitor treatment.       Radiology Studies: No results found.   Scheduled Meds: . allopurinol  100 mg Oral QPM  . aspirin EC  81 mg Oral QPM  . calcitRIOL  0.25 mcg Oral Q M,W,F-HD  . chlorhexidine  60 mL Topical Once  . cyanocobalamin  250 mcg Oral QPM  . [START ON 02/23/2017] darbepoetin (ARANESP) injection - DIALYSIS  60 mcg Intravenous Q Sat-HD  . feeding supplement (NEPRO CARB STEADY)  237 mL Oral BID BM  . heparin  5,000 Units Subcutaneous Q8H  . multivitamin  1 tablet Oral QHS  . sucroferric oxyhydroxide  1,000 mg Oral TID WC   Continuous Infusions: . sodium chloride    . cefTAZidime (FORTAZ)  IV Stopped (02/11/17 1812)  . [START ON 02/13/2017] cefTAZidime (FORTAZ)  IV    . cefUROXime (ZINACEF)  IV    . cefUROXime (ZINACEF)  IV    . vancomycin Stopped (02/11/17 1308)     LOS: 2 days    Time spent: 30 min    Renae Fickle, MD [facility] Pager [phone]  If  7PM-7AM, please contact night-coverage [email or web address] Password [facility] 02/12/2017, 4:34 PM

## 2017-02-12 NOTE — Anesthesia Preprocedure Evaluation (Deleted)
Anesthesia Evaluation  Patient identified by MRN, date of birth, ID band Patient awake    Reviewed: Allergy & Precautions, NPO status , Patient's Chart, lab work & pertinent test results  Airway        Dental   Pulmonary neg pulmonary ROS,    breath sounds clear to auscultation       Cardiovascular hypertension, + angina + CAD, + Past MI, + Peripheral Vascular Disease and +CHF   Rhythm:Regular Rate:Normal     Neuro/Psych negative neurological ROS     GI/Hepatic   Endo/Other  diabetes, Poorly ControlledMorbid obesity  Renal/GU ESRFRenal disease     Musculoskeletal   Abdominal (+) + obese,   Peds  Hematology   Anesthesia Other Findings   Reproductive/Obstetrics                             Anesthesia Physical Anesthesia Plan  ASA: IV  Anesthesia Plan: MAC   Post-op Pain Management:    Induction: Intravenous  PONV Risk Score and Plan: 2 and Ondansetron and Dexamethasone  Airway Management Planned: Simple Face Mask and Natural Airway  Additional Equipment:   Intra-op Plan:   Post-operative Plan:   Informed Consent: I have reviewed the patients History and Physical, chart, labs and discussed the procedure including the risks, benefits and alternatives for the proposed anesthesia with the patient or authorized representative who has indicated his/her understanding and acceptance.     Plan Discussed with: CRNA  Anesthesia Plan Comments:         Anesthesia Quick Evaluation

## 2017-02-12 NOTE — Progress Notes (Signed)
Patients central line is leaking. IV team consulted. IV team member stated that we need an order to remove the central line since it is ineffective. MD notified. MD placed order for PICC line removal. IV team called and stated that since it was a central line, the order would have to state central line removal. Orders modified. Will continue to monitor.

## 2017-02-12 NOTE — Progress Notes (Signed)
I got in touch with the patients wife who stated that no one has explained the patients procedure to her and that she needed further information. She stated that the earliest she could be at the hospital would be tomorrow to gather further information from the MD and sign consent. Fields, MD paged [phone]. No return pages. Will continue to monitor.

## 2017-02-12 NOTE — Progress Notes (Signed)
I walked into the room and found the patient to have "snuff" or tobacco in his mouth. Patient denied use. I educated patient that this was not allowed. Patient stated that he understood and removed it from his mouth. Will continue to monitor.

## 2017-02-12 NOTE — Care Management Note (Signed)
Case Management Note  Patient Details  Name: [name] MRN: [id number] Date of Birth: 1935/09/16  Subjective/Objective:      CM following for progression and d/c planning.               Action/Plan: 02/12/2017 Noted that pt lives at home with wife, currently with some confusion and unable to sign permit for AKA, unable to reach wife in a timely manner. IV antibiotics continue until surgery is rescheduled.   Expected Discharge Date:                  Expected Discharge Plan:  Skilled Nursing Facility  In-House Referral:  Clinical Social Work  Discharge planning Services  CM Consult  Post Acute Care Choice:    Choice offered to:     DME Arranged:    DME Agency:     HH Arranged:    HH Agency:     Status of Service:  In process, will continue to follow  If discussed at Long Length of Stay Meetings, dates discussed:    Additional Comments:  Starlyn Skeans, RN 02/12/2017, 3:45 PM

## 2017-02-12 NOTE — Progress Notes (Signed)
Pharmacy Antibiotic Note  [name] is a 81 y.o. male admitted on 02/10/2017 with wound infection/gangrenous toes.  Pharmacy has been consulted for ceftazidime and vancomycin dosing.  The patient is ESRD and receives HD on MWF - currently on schedule. The patient's last HD session on was 9/24 (4.25 hr BFR 375-400). [facility] is attempting to get consent for a R-AKA. Vancomycin dosing is appropriate, will adjust Ceftazidime to be given on HD days starting 9/26.   Plan: 1. Continue Vancomycin 1g/HD-MWF 2. Ceftazidime 1g IV every 24 hours x 1 more dose this evening 3. Start Ceftazidime 2g on MWF @ 1800 starting on 9/26 4. Will follow-up antibiotic LOT plans post-op if the patient does undergo a AKA 4. Will continue to follow HD schedule/duration, culture results, LOT, and antibiotic de-escalation plans   Height:  (177.8 cm) Weight: 213 lb 3 oz (96.7 kg) IBW/kg (Calculated) : 73  Temp (24hrs), Avg:98.3 F (36.8 C), Min:98 F (36.7 C), Max:98.5 F (36.9 C)   Recent Labs Lab 02/06/17 1328 02/10/17 0403 02/11/17 0816 02/12/17 0003  WBC  --  10.2 10.2 10.5  CREATININE 11.30* 9.43* 11.49* 7.49*    Estimated Creatinine Clearance: 9.3 mL/min (A) (by C-G formula based on SCr of 7.49 mg/dL (H)).    Allergies  Allergen Reactions  . Iohexol Swelling and Rash  . Zanaflex [Tizanidine] Other (See Comments)    Shut kidneys down Unable to walk or void  . Ezetimibe-Simvastatin Other (See Comments)    Elevated liver enzymes  . Fluvastatin Other (See Comments)    Elevated liver enzymes  . Simvastatin Other (See Comments)    Elevated liver enzymes    Antimicrobials this admission: Vanc 9/23 >> * 2g LD given 9/23 Ceftax 9/23>>  Microbiology results: 9/23 MRSA PCR: positive  Thank you for allowing pharmacy to be a part of this patient's care.  Georgina Pillion, PharmD, BCPS Clinical Pharmacist Pager: [phone] Clinical phone for 02/12/2017 from 7a-3:30p: [phone] If after 3:30p,  please call main pharmacy at: [phone] 02/12/2017 10:33 AM

## 2017-02-12 NOTE — Progress Notes (Signed)
Patients MRSA PCR negative. Infection prevention contacted and they stated to D/C contact precautions. Contact precautions D/C'd. Will continue to monitor.

## 2017-02-13 LAB — CBC
HEMATOCRIT: 28.5 % — AB (ref 39.0–52.0)
HEMOGLOBIN: 9.5 g/dL — AB (ref 13.0–17.0)
MCH: 31.4 pg (ref 26.0–34.0)
MCHC: 33.3 g/dL (ref 30.0–36.0)
MCV: 94.1 fL (ref 78.0–100.0)
Platelets: 253 10*3/uL (ref 150–400)
RBC: 3.03 MIL/uL — ABNORMAL LOW (ref 4.22–5.81)
RDW: 15.5 % (ref 11.5–15.5)
WBC: 8.6 10*3/uL (ref 4.0–10.5)

## 2017-02-13 LAB — RENAL FUNCTION PANEL
ANION GAP: 10 (ref 5–15)
Albumin: 2.5 g/dL — ABNORMAL LOW (ref 3.5–5.0)
BUN: 29 mg/dL — ABNORMAL HIGH (ref 6–20)
CHLORIDE: 97 mmol/L — AB (ref 101–111)
CO2: 28 mmol/L (ref 22–32)
Calcium: 8.8 mg/dL — ABNORMAL LOW (ref 8.9–10.3)
Creatinine, Ser: 10.15 mg/dL — ABNORMAL HIGH (ref 0.61–1.24)
GFR calc Af Amer: 5 mL/min — ABNORMAL LOW (ref >60)
GFR calc non Af Amer: 4 mL/min — ABNORMAL LOW (ref >60)
GLUCOSE: 86 mg/dL (ref 65–99)
POTASSIUM: 4 mmol/L (ref 3.5–5.1)
Phosphorus: 4.9 mg/dL — ABNORMAL HIGH (ref 2.5–4.6)
Sodium: 135 mmol/L (ref 135–145)

## 2017-02-13 LAB — GLUCOSE, CAPILLARY
GLUCOSE-CAPILLARY: 90 mg/dL (ref 65–99)
GLUCOSE-CAPILLARY: 145 mg/dL — AB (ref 65–99)
Glucose-Capillary: 83 mg/dL (ref 65–99)

## 2017-02-13 LAB — POTASSIUM: Potassium: 4.5 mmol/L (ref 3.5–5.1)

## 2017-02-13 MED ORDER — SODIUM CHLORIDE 0.9 % IV SOLN: 100.0000 mL | INTRAVENOUS | Status: DC | PRN

## 2017-02-13 MED ORDER — HEPARIN SODIUM (PORCINE) 1000 UNIT/ML DIALYSIS: 20.0000 [IU]/kg | INTRAMUSCULAR | Status: DC | PRN

## 2017-02-13 MED ORDER — HEPARIN SODIUM (PORCINE) 1000 UNIT/ML DIALYSIS: 1000.0000 [IU] | INTRAMUSCULAR | Status: DC | PRN

## 2017-02-13 MED ORDER — CALCITRIOL 0.25 MCG PO CAPS
ORAL_CAPSULE | ORAL | Status: AC
  Filled 2017-02-13: qty 1

## 2017-02-13 MED ORDER — LIDOCAINE HCL (PF) 1 % IJ SOLN
5.0000 mL | INTRAMUSCULAR | Status: DC | PRN
Start: 2017-02-13 — End: 2017-02-13

## 2017-02-13 MED ORDER — ALTEPLASE 2 MG IJ SOLR: 2.0000 mg | Freq: Once | INTRAMUSCULAR | Status: DC | PRN

## 2017-02-13 MED ORDER — LIDOCAINE-PRILOCAINE 2.5-2.5 % EX CREA: 1.0000 "application " | TOPICAL_CREAM | CUTANEOUS | Status: DC | PRN

## 2017-02-13 MED ORDER — PENTAFLUOROPROP-TETRAFLUOROETH EX AERO: 1.0000 "application " | INHALATION_SPRAY | CUTANEOUS | Status: DC | PRN

## 2017-02-13 MED ORDER — VANCOMYCIN HCL IN DEXTROSE 1-5 GM/200ML-% IV SOLN
INTRAVENOUS | Status: AC
  Filled 2017-02-13: qty 200

## 2017-02-13 MED ORDER — OXYCODONE HCL 5 MG PO TABS
ORAL_TABLET | ORAL | Status: AC
  Filled 2017-02-13: qty 2

## 2017-02-13 NOTE — Progress Notes (Addendum)
   Will schedule for right aka when family available for consent. Patient unable to consent at bedside today and currently not emergency.   Aquilla Voiles C. Randie Heinz, MD [facility] Office: [phone] Pager: [phone]  Discussed with wife patient needs right above knee amputation. Will plan for right aka tomorrow in OR. NPO past midnight.   Lemar Livings, MD

## 2017-02-13 NOTE — Progress Notes (Signed)
[facility] Progress Note   Dialysis Orders:  [facility] MWF  4.25 hr 450/800 2 K 2 Ca EDW 100.5 - profile 2 left lwoer AVGG heparin 7500 Mircera 50 q 2 wks - last 9/22, calcitriol 0.25 Parsabiv 5 Recent labs: hgb 10.8 29% sat iPTH 1780 Ca 9.8 P 6.8  Assessment/Plan: 1. Bilateral infected and ischemic toes  R > L; needs amputation- per Dr. Darrick Penna note he is on the OR schedule for next week; on empiric Vanc and Fortaz 2. ESRD -  MWF -next HD today K 4 - use  3. Hypertension/volume  - BP better net UF 3L Monday  - plan volume reduction-not on BP meds- has BP drops with HD - change to profile 4 lower temp-- poor intake - reduce goal because he is not eating 4. Anemia  - hgb drifting down 9.5 ; not due for ESA dose til Oct 5. Metabolic bone disease -  Continue calcitriol and binders; Parsabiv not avail in hospital 6. Nutrition - renal diet/multivitamin/Nepro alb 2.5 - intake very poor and now NPO   Sheffield Slider, PA-C [facility] Beeper [phone] 02/13/2017,9:02 AM  LOS: 3 days   I have seen and examined this patient and agree with plan and assessment in the above note with renal recommendations/intervention highlighted.  Jomarie Longs A Saladin Petrelli,MD 02/13/2017 3:09 PM   Subjective:   States he is here because of blood circulation.  When I suggested that he had foot wounds that needed amputation, he said "Oh yea, that too"  Objective Vitals:   02/12/17 0824 02/12/17 1742 02/12/17 2110 02/13/17 0448  BP: (!) 158/51 (!) 134/39 (!) 125/51 132/64  Pulse: 84 74 88 76  Resp: Temp: 98.5 F (36.9 C) 98.5 F (36.9 C) 98.4 F (36.9 C) 98.6 F (37 C)  TempSrc: Oral Oral Oral Oral  SpO2: 96% 100% 100% 100%  Weight:   96.5 kg (212 lb 11.9 oz)   Height:       Physical Exam General: breathing easily supine in bed Heart: RRR Lungs: no rales Abdomen: soft NT Extremities: no edema  Black ischemic toes with some forefoot involvment on the right  Dialysis  Access: left AVGG + bruit   Additional Objective Labs: Basic Metabolic Panel:  Recent Labs Lab 02/11/17 0816 02/12/17 0003 02/13/17 0710  NA 138 135 135  K 4.4 3.7 4.0  CL 94* 96* 97*  CO2 GLUCOSE 75 189* 86  BUN 42* 21* 29*  CREATININE 11.49* 7.49* 10.15*  CALCIUM 8.4* 8.6* 8.8*  PHOS 5.4*  --  4.9*   Liver Function Tests:  Recent Labs Lab 02/10/17 0403 02/11/17 0816 02/13/17 0710  AST 26  --   --   ALT 27  --   --   ALKPHOS 114  --   --   BILITOT 0.8  --   --   PROT 6.6  --   --   ALBUMIN 2.7* 2.4* 2.5*   CBC:  Recent Labs Lab 02/10/17 0403 02/11/17 0816 02/12/17 0003 02/13/17 0710  WBC 10.2 10.2 10.5 8.6  HGB 9.6* 9.7* 9.6* 9.5*  HCT 30.1* 30.1* 30.0* 28.5*  MCV 94.7 93.8 94.9 94.1  PLT 282 267 270 253   Blood Culture    Component Value Date/Time   SDES BLOOD AVG 01/01/2017 1425   SPECREQUEST  01/01/2017 1425    BOTTLES DRAWN AEROBIC AND ANAEROBIC Blood Culture results may not be optimal due to an excessive  volume of blood received in culture bottles   CULT NO GROWTH 5 DAYS 01/01/2017 1425   REPTSTATUS 01/06/2017 FINAL 01/01/2017 1425    CBG:  Recent Labs Lab 02/12/17 0851 02/12/17 1150 02/12/17 1653 02/12/17 2024 02/13/17 0752  GLUCAP 109* 100* 90 83 83   Iron Studies: No results for input(s): IRON, TIBC, TRANSFERRIN, FERRITIN in the last 72 hours. Lab Results  Component Value Date   INR 1.10 01/01/2017   INR 1.3 12/04/2008   INR 1.0 08/21/2007   Studies/Results: No results found. Medications: . sodium chloride    . sodium chloride    . sodium chloride    . cefTAZidime (FORTAZ)  IV    . cefUROXime (ZINACEF)  IV    . cefUROXime (ZINACEF)  IV    . vancomycin Stopped (02/11/17 1308)   . allopurinol  100 mg Oral QPM  . aspirin EC  81 mg Oral QPM  . calcitRIOL  0.25 mcg Oral Q M,W,F-HD  . chlorhexidine  60 mL Topical Once  . cyanocobalamin  250 mcg Oral QPM  . [START ON 02/23/2017] darbepoetin (ARANESP) injection -  DIALYSIS  60 mcg Intravenous Q Sat-HD  . feeding supplement (NEPRO CARB STEADY)  237 mL Oral BID BM  . heparin  5,000 Units Subcutaneous Q8H  . multivitamin  1 tablet Oral QHS  . sucroferric oxyhydroxide  1,000 mg Oral TID WC

## 2017-02-13 NOTE — Progress Notes (Signed)
Pt continuously removes his cardiac monitor. MD made aware. [place] Carbo, RN

## 2017-02-13 NOTE — Evaluation (Signed)
Physical Therapy Evaluation Patient Details Name: [name] MRN: [id number] DOB: [birth year] Today's Date: 02/13/2017   History of Present Illness  The patient is a 81 yo male with history of ESRD dialysis TTS, HTN, MI, PAD, DM2.  Patient was recently admitted and discharged last month with cellulitis of the RLE foot.  Due to gangrene he had follow up cath with Dr. Randie Heinz on 9/19.  They were unable to re-vascularize the R foot.  R AKA was recommended, and team is workingout when able to perform AKA  Clinical Impression   Pt admitted with above diagnosis. Pt currently with functional limitations due to the deficits listed below (see PT Problem List). Overall quite a functional decline since last admission with plans for R AKA soon;  Pt will benefit from skilled PT to increase their independence and safety with mobility to allow discharge to the venue listed below.       Follow Up Recommendations SNF    Equipment Recommendations       Recommendations for Other Services       Precautions / Restrictions Restrictions Weight Bearing Restrictions: No      Mobility  Bed Mobility Overal bed mobility: Needs Assistance Bed Mobility: Supine to Sit;Sit to Supine     Supine to sit: +2 for physical assistance;Max assist Sit to supine: +2 for physical assistance;Max assist   General bed mobility comments: Agreeable to sit up; Max assist and support to clear LEs from EOB and elevate trunk to sit; Mutlimodal cues for technique; Max assist to help LEs back onto bed and ease shoulders down to supine  Transfers Overall transfer level: Needs assistance Equipment used: 2 person hand held assist (and bed pad use) Transfers: Lateral/Scoot Transfers          Lateral/Scoot Transfers: Max assist;+2 physical assistance General transfer comment: Attempted basic stand pivot transfer to get closer to the [facility] for better positioning; unabel to clear hips from EOB despite L knee blocked; multimodal cues  to initiate lateral scoot with anterior weight shifting  Ambulation/Gait                Stairs            Wheelchair Mobility    Modified Rankin (Stroke Patients Only)       Balance Overall balance assessment: Needs assistance   Sitting balance-Leahy Scale: Fair                                       Pertinent Vitals/Pain Pain Assessment: Faces Faces Pain Scale: Hurts even more Pain Location: bil feet, especially RLE/foot during bed mobility Pain Descriptors / Indicators: Grimacing Pain Intervention(s): Monitored during session    Home Living Family/patient expects to be discharged to:: Private residence Living Arrangements: Spouse/significant other Available Help at Discharge: Family;Available 24 hours/day Type of Home: House Home Access: Stairs to enter Entrance Stairs-Rails: Doctor, general practice of Steps: 8 Home Layout: One level Home Equipment: Cane - single point;Walker - 2 wheels;Bedside commode;Wheelchair - manual Additional Comments: Information gleaned from chart review    Prior Function Level of Independence: Needs assistance         Comments: Pt unable to give information re: PLOF; Gleaned from chart review that wife assists with ADLs and he used a RW consistently with amb     Hand Dominance   Dominant Hand: Right    Extremity/Trunk Assessment  Upper Extremity Assessment Upper Extremity Assessment: Generalized weakness (Decreased shoulder slexion/elevation with grimace)    Lower Extremity Assessment Lower Extremity Assessment: Generalized weakness;LLE deficits/detail;RLE deficits/detail RLE Deficits / Details: Painful to move; noted necrotic toes; Decr ankle, knee, hip AROM, limited by pain; able to initiate long arc quad LLE Deficits / Details: Evidence of necrosis L foot as well; quad strength at least 3/5; ankle and hip ROM WFL for simple mobility tasks       Communication   Communication: HOH   Cognition Arousal/Alertness: Awake/alert Behavior During Therapy: WFL for tasks assessed/performed;Restless (slightly restless, trying to get mitts off) Overall Cognitive Status: No family/caregiver present to determine baseline cognitive functioning Area of Impairment: Problem solving                             Problem Solving: Slow processing;Decreased initiation;Difficulty sequencing;Requires verbal cues        General Comments      Exercises     Assessment/Plan    PT Assessment Patient needs continued PT services  PT Problem List Decreased strength;Decreased range of motion;Decreased activity tolerance;Decreased balance;Decreased mobility;Decreased coordination;Decreased cognition;Decreased knowledge of use of DME;Decreased safety awareness;Decreased knowledge of precautions;Pain       PT Treatment Interventions DME instruction;Gait training;Functional mobility training;Therapeutic activities;Therapeutic exercise;Balance training;Neuromuscular re-education;Cognitive remediation;Patient/family education;Wheelchair mobility training    PT Goals (Current goals can be found in the Care Plan section)  Acute Rehab PT Goals Patient Stated Goal: Did not state PT Goal Formulation: Patient unable to participate in goal setting Time For Goal Achievement: 02/27/17 Potential to Achieve Goals: Fair    Frequency Min 2X/week   Barriers to discharge        Co-evaluation               AM-PAC PT "6 Clicks" Daily Activity  Outcome Measure Difficulty turning over in bed (including adjusting bedclothes, sheets and blankets)?: Unable Difficulty moving from lying on back to sitting on the side of the bed? : Unable Difficulty sitting down on and standing up from a chair with arms (e.g., wheelchair, bedside commode, etc,.)?: Unable Help needed moving to and from a bed to chair (including a wheelchair)?: A Lot Help needed walking in hospital room?: Total Help needed  climbing 3-5 steps with a railing? : Total 6 Click Score: 7    End of Session Equipment Utilized During Treatment:  (bed pad) Activity Tolerance: Patient tolerated treatment well Patient left: in bed;with bed alarm set;with restraints reapplied Nurse Communication: Mobility status PT Visit Diagnosis: Muscle weakness (generalized) (M62.81);Pain;Unsteadiness on feet (R26.81);Other abnormalities of gait and mobility (R26.89);Other (comment) (Plan for R AKA) Pain - Right/Left:  (Both) Pain - part of body: Leg    Time: 0912-0933 PT Time Calculation (min) (ACUTE ONLY): 21 min   Charges:   PT Evaluation $PT Eval Moderate Complexity: 1 Mod     PT G Codes:        Van Clines, PT  Acute Rehabilitation Services Pager [phone] Office [phone]   [name] 02/13/2017, 10:27 AM

## 2017-02-13 NOTE — Procedures (Signed)
I was present at this dialysis session. I have reviewed the session itself and made appropriate changes.   Filed Weights   02/11/17 1240 02/11/17 2056 02/12/17 2110  Weight: 96.8 kg (213 lb 6.5 oz) 96.7 kg (213 lb 3 oz) 96.5 kg (212 lb 11.9 oz)     Recent Labs Lab 02/13/17 0710  NA 135  K 4.0  CL 97*  CO2 28  GLUCOSE 86  BUN 29*  CREATININE 10.15*  CALCIUM 8.8*  PHOS 4.9*     Recent Labs Lab 02/11/17 0816 02/12/17 0003 02/13/17 0710  WBC 10.2 10.5 8.6  HGB 9.7* 9.6* 9.5*  HCT 30.1* 30.0* 28.5*  MCV 93.8 94.9 94.1  PLT 267 270 253    Scheduled Meds: . allopurinol  100 mg Oral QPM  . aspirin EC  81 mg Oral QPM  . calcitRIOL  0.25 mcg Oral Q M,W,F-HD  . chlorhexidine  60 mL Topical Once  . cyanocobalamin  250 mcg Oral QPM  . [START ON 02/23/2017] darbepoetin (ARANESP) injection - DIALYSIS  60 mcg Intravenous Q Sat-HD  . feeding supplement (NEPRO CARB STEADY)  237 mL Oral BID BM  . heparin  5,000 Units Subcutaneous Q8H  . multivitamin  1 tablet Oral QHS  . oxyCODONE      . sucroferric oxyhydroxide  1,000 mg Oral TID WC   Continuous Infusions: . sodium chloride    . sodium chloride    . sodium chloride    . cefTAZidime (FORTAZ)  IV    . cefUROXime (ZINACEF)  IV    . vancomycin Stopped (02/11/17 1308)   PRN Meds:.sodium chloride, sodium chloride, acetaminophen **OR** acetaminophen, alteplase, diphenhydrAMINE, heparin, heparin, lidocaine (PF), lidocaine-prilocaine, ondansetron **OR** ondansetron (ZOFRAN) IV, oxyCODONE, pentafluoroprop-tetrafluoroeth, sodium chloride flush   Irena Cords,  MD 02/13/2017, 3:10 PM

## 2017-02-13 NOTE — Progress Notes (Signed)
Patient refused his telemetry. Patient is combative to staff as well. Will continue to monitor.

## 2017-02-13 NOTE — Progress Notes (Signed)
PROGRESS NOTE    [name]  UJW:[id number] DOB: 1936-02-12 DOA: 02/10/2017 PCP: Crist Fat, MD    Brief Narrative: 26 transferred from [facility] with AMS and gangrene foot.  Vascular planned to perform right AKA as soon as informed consent is obtained, as he is unable to give same.  He also has ESRD and has been on HD MWF, along with anemia.  He is currently on Van/Fortaz, and mental status improved marginally.     Assessment & Plan:   Principal Problem:   Gangrene of foot (HCC) Active Problems:   End stage renal disease (HCC)   Hypertension   Acute metabolic encephalopathy   Diabetes mellitus with complication (HCC)   Pressure injury of skin  Gangrene of the bilateral feet, right greater than left.   -  Vascular surgery assistance appreciated -  Continue vancomycin and Fortaz  Acute metabolic encephalopathy, likely due to gangrene, marginally improved.   -  Continue antibiotics  ESRD -  Nephrology consulting and patient receiving intermittent HD  Hypertension, currently on no medications  Diabetes mellitus type 2, on no medications -  Patient refusing CBG/insulin  Right neck central line (patient refused to let me examine this morning), site appears non-erythematous, dressing is clean -  CXR overnight shows it is short but the tip is no longer twisted -  D/c PICC line   DVT prophylaxis:  heparin Code Status:  Full  Family Communication:  Patient alone Disposition Plan:  Pending amputation  Consultants:   Vascular surgery.   Procedures: None.    Antimicrobials: Anti-infectives    Start     Dose/Rate Route Frequency Ordered Stop   02/13/17 2000  cefTAZidime (FORTAZ) 2 g in dextrose 5 % 50 mL IVPB     2 g 100 mL/hr over 30 Minutes Intravenous Every M-W-F (2000) 02/12/17 1035     02/12/17 0930  cefUROXime (ZINACEF) 1.5 g in dextrose 5 % 50 mL IVPB     1.5 g 100 mL/hr over 30 Minutes Intravenous To Surgery 02/11/17 1706 02/13/17 0930   02/12/17 0926  cefUROXime (ZINACEF) 1.5 g in dextrose 5 % 50 mL IVPB     1.5 g 100 mL/hr over 30 Minutes Intravenous 30 min pre-op 02/11/17 1757     02/11/17 1200  vancomycin (VANCOCIN) IVPB 1000 mg/200 mL premix     1,000 mg 200 mL/hr over 60 Minutes Intravenous Every M-W-F (Hemodialysis) 02/10/17 1355     02/10/17 1400  vancomycin (VANCOCIN) 2,000 mg in sodium chloride 0.9 % 500 mL IVPB     2,000 mg 250 mL/hr over 120 Minutes Intravenous  Once 02/10/17 1355 02/10/17 1900   02/10/17 1400  cefTAZidime (FORTAZ) 1 g in dextrose 5 % 50 mL IVPB     1 g 100 mL/hr over 30 Minutes Intravenous Daily 02/10/17 1356 02/12/17 1735   02/10/17 0930  cefTAZidime (FORTAZ) 1 g in dextrose 5 % 50 mL IVPB  Status:  Discontinued     1 g 100 mL/hr over 30 Minutes Intravenous Every 24 hours 02/10/17 0845 02/10/17 1356   02/10/17 0930  vancomycin (VANCOCIN) 2,000 mg in sodium chloride 0.9 % 500 mL IVPB  Status:  Discontinued     2,000 mg 250 mL/hr over 120 Minutes Intravenous  Once 02/10/17 0845 02/10/17 1355       Subjective:  I am fine.   Objective: Vitals:   02/12/17 1742 02/12/17 2110 02/13/17 0448 02/13/17 0900  BP: (!) 134/39 (!) 125/51 132/64 128/62  Pulse: 74 88 76 75  Resp: Temp: 98.5 F (36.9 C) 98.4 F (36.9 C) 98.6 F (37 C) 98 F (36.7 C)  TempSrc: Oral Oral Oral Oral  SpO2: 100% 100% 100% 100%  Weight:  96.5 kg (212 lb 11.9 oz)    Height:        Intake/Output Summary (Last 24 hours) at 02/13/17 1219 Last data filed at 02/13/17 0601  Gross per 24 hour  Intake              240 ml  Output                0 ml  Net              240 ml   Filed Weights   02/11/17 1240 02/11/17 2056 02/12/17 2110  Weight: 96.8 kg (213 lb 6.5 oz) 96.7 kg (213 lb 3 oz) 96.5 kg (212 lb 11.9 oz)    Examination:  General exam: Appears calm and comfortable  Respiratory system: Clear to auscultation. Respiratory effort normal. Cardiovascular system: S1 & S2 heard, RRR. No JVD, murmurs,  rubs, gallops or clicks. No pedal edema. Gastrointestinal system: Abdomen is nondistended, soft and nontender. No organomegaly or masses felt. Normal bowel sounds heard. Central nervous system: Alert and oriented. No focal neurological deficits. Extremities: dry gangrene of both feet.  R greater than left.  Skin: No rashes, lesions or ulcers Psychiatry: Judgement and insight appear normal. Mood & affect appropriate.   Data Reviewed: I have personally reviewed following labs and imaging studies  CBC:  Recent Labs Lab 02/06/17 1328 02/10/17 0403 02/11/17 0816 02/12/17 0003 02/13/17 0710  WBC  --  10.2 10.2 10.5 8.6  HGB 10.9* 9.6* 9.7* 9.6* 9.5*  HCT 32.0* 30.1* 30.1* 30.0* 28.5*  MCV  --  94.7 93.8 94.9 94.1  PLT  --  282 267 270 253   Basic Metabolic Panel:  Recent Labs Lab 02/06/17 1328 02/10/17 0403 02/11/17 0816 02/12/17 0003 02/13/17 0710  NA 134* 136 138 135 135  K 4.8 4.1 4.4 3.7 4.0  CL 95* 91* 94* 96* 97*  CO2  --  GLUCOSE 88 69 75 189* 86  BUN 43* 33* 42* 21* 29*  CREATININE 11.30* 9.43* 11.49* 7.49* 10.15*  CALCIUM  --  8.6* 8.4* 8.6* 8.8*  PHOS  --   --  5.4*  --  4.9*   GFR: Estimated Creatinine Clearance: 6.9 mL/min (A) (by C-G formula based on SCr of 10.15 mg/dL (H)). Liver Function Tests:  Recent Labs Lab 02/10/17 0403 02/11/17 0816 02/13/17 0710  AST 26  --   --   ALT 27  --   --   ALKPHOS 114  --   --   BILITOT 0.8  --   --   PROT 6.6  --   --   ALBUMIN 2.7* 2.4* 2.5*   CBG:  Recent Labs Lab 02/12/17 1150 02/12/17 1653 02/12/17 2024 02/13/17 0752 02/13/17 1143  GLUCAP 100* 90 83 83 90     Recent Results (from the past 240 hour(s))  MRSA PCR Screening     Status: Abnormal   Collection Time: 02/10/17  3:33 AM  Result Value Ref Range Status   MRSA by PCR POSITIVE (A) NEGATIVE Final    Comment:        The GeneXpert MRSA Assay (FDA approved for NASAL specimens only), is one component of a comprehensive MRSA  colonization  surveillance program. It is not intended to diagnose MRSA infection nor to guide or monitor treatment for MRSA infections. RESULT CALLED TO, READ BACK BY AND VERIFIED WITH: IRBY,T RN [phone] AT [phone] [facility]   Surgical pcr screen     Status: None   Collection Time: 02/12/17  5:09 AM  Result Value Ref Range Status   MRSA, PCR NEGATIVE NEGATIVE Final   Staphylococcus aureus NEGATIVE NEGATIVE Final    Comment: (NOTE) The Xpert SA Assay (FDA approved for NASAL specimens in patients 72 years of age and older), is one component of a comprehensive surveillance program. It is not intended to diagnose infection nor to guide or monitor treatment.      Radiology Studies: No results found.  Scheduled Meds: . allopurinol  100 mg Oral QPM  . aspirin EC  81 mg Oral QPM  . calcitRIOL  0.25 mcg Oral Q M,W,F-HD  . chlorhexidine  60 mL Topical Once  . cyanocobalamin  250 mcg Oral QPM  . [START ON 02/23/2017] darbepoetin (ARANESP) injection - DIALYSIS  60 mcg Intravenous Q Sat-HD  . feeding supplement (NEPRO CARB STEADY)  237 mL Oral BID BM  . heparin  5,000 Units Subcutaneous Q8H  . multivitamin  1 tablet Oral QHS  . sucroferric oxyhydroxide  1,000 mg Oral TID WC   Continuous Infusions: . sodium chloride    . sodium chloride    . sodium chloride    . cefTAZidime (FORTAZ)  IV    . cefUROXime (ZINACEF)  IV    . vancomycin Stopped (02/11/17 1308)     LOS: 3 days   Delita Chiquito, MD FACP Hospitalist.   If 7PM-7AM, please contact night-coverage [email or web address] Password [facility] 02/13/2017, 12:19 PM

## 2017-02-14 ENCOUNTER — Inpatient Hospital Stay (HOSPITAL_COMMUNITY): Payer: Medicare Other | Admitting: Certified Registered Nurse Anesthetist

## 2017-02-14 ENCOUNTER — Encounter (HOSPITAL_COMMUNITY): Payer: Self-pay | Admitting: Certified Registered Nurse Anesthetist

## 2017-02-14 ENCOUNTER — Encounter (HOSPITAL_COMMUNITY)
Admission: AD | Disposition: A | Discharge status: Skilled Nursing Facility | Payer: Self-pay | Source: Other Acute Inpatient Hospital | Attending: Family Medicine

## 2017-02-14 DIAGNOSIS — I96 Gangrene, not elsewhere classified: Secondary | ICD-10-CM

## 2017-02-14 HISTORY — PX: AMPUTATION: SHX166

## 2017-02-14 LAB — RENAL FUNCTION PANEL
Albumin: 2.8 g/dL — ABNORMAL LOW (ref 3.5–5.0)
Anion gap: 9 (ref 5–15)
BUN: 19 mg/dL (ref 6–20)
CO2: 31 mmol/L (ref 22–32)
Calcium: 9.1 mg/dL (ref 8.9–10.3)
Chloride: 96 mmol/L — ABNORMAL LOW (ref 101–111)
Creatinine, Ser: 7.46 mg/dL — ABNORMAL HIGH (ref 0.61–1.24)
GFR calc Af Amer: 7 mL/min — ABNORMAL LOW
GFR calc non Af Amer: 6 mL/min — ABNORMAL LOW
Glucose, Bld: 134 mg/dL — ABNORMAL HIGH (ref 65–99)
Phosphorus: 4.3 mg/dL (ref 2.5–4.6)
Potassium: 4.4 mmol/L (ref 3.5–5.1)
Sodium: 136 mmol/L (ref 135–145)

## 2017-02-14 LAB — BASIC METABOLIC PANEL
Anion gap: 11 (ref 5–15)
BUN: 15 mg/dL (ref 6–20)
CO2: 29 mmol/L (ref 22–32)
CREATININE: 6.24 mg/dL — AB (ref 0.61–1.24)
Calcium: 9.3 mg/dL (ref 8.9–10.3)
Chloride: 96 mmol/L — ABNORMAL LOW (ref 101–111)
GFR calc Af Amer: 9 mL/min — ABNORMAL LOW (ref >60)
GFR calc non Af Amer: 8 mL/min — ABNORMAL LOW (ref >60)
GLUCOSE: 89 mg/dL (ref 65–99)
POTASSIUM: 4.3 mmol/L (ref 3.5–5.1)
Sodium: 136 mmol/L (ref 135–145)

## 2017-02-14 LAB — CBC
HCT: 27.7 % — ABNORMAL LOW (ref 39.0–52.0)
Hemoglobin: 9 g/dL — ABNORMAL LOW (ref 13.0–17.0)
MCH: 30.7 pg (ref 26.0–34.0)
MCHC: 32.5 g/dL (ref 30.0–36.0)
MCV: 94.5 fL (ref 78.0–100.0)
Platelets: 263 10*3/uL (ref 150–400)
RBC: 2.93 MIL/uL — ABNORMAL LOW (ref 4.22–5.81)
RDW: 15.4 % (ref 11.5–15.5)
WBC: 11.1 10*3/uL — ABNORMAL HIGH (ref 4.0–10.5)

## 2017-02-14 LAB — GLUCOSE, CAPILLARY
GLUCOSE-CAPILLARY: 105 mg/dL — AB (ref 65–99)
GLUCOSE-CAPILLARY: 110 mg/dL — AB (ref 65–99)
Glucose-Capillary: 90 mg/dL (ref 65–99)
Glucose-Capillary: 92 mg/dL (ref 65–99)
Glucose-Capillary: 90 mg/dL (ref 65–99)

## 2017-02-14 SURGERY — AMPUTATION, ABOVE KNEE
Anesthesia: General | Site: Leg Upper | Laterality: Right

## 2017-02-14 MED ORDER — ALBUMIN HUMAN 5 % IV SOLN
INTRAVENOUS | Status: DC | PRN
  Administered 2017-02-14 (×2): via INTRAVENOUS

## 2017-02-14 MED ORDER — PENTAFLUOROPROP-TETRAFLUOROETH EX AERO: 1.0000 "application " | INHALATION_SPRAY | CUTANEOUS | Status: DC | PRN

## 2017-02-14 MED ORDER — HEPARIN SODIUM (PORCINE) 1000 UNIT/ML DIALYSIS: 1000.0000 [IU] | INTRAMUSCULAR | Status: DC | PRN

## 2017-02-14 MED ORDER — CHLORHEXIDINE GLUCONATE 4 % EX LIQD
60.0000 mL | Freq: Once | CUTANEOUS | Status: AC
  Administered 2017-02-14: 4 via TOPICAL
  Filled 2017-02-14: qty 60

## 2017-02-14 MED ORDER — HEPARIN SODIUM (PORCINE) 5000 UNIT/ML IJ SOLN
5000.0000 [IU] | Freq: Three times a day (TID) | INTRAMUSCULAR | Status: DC
  Administered 2017-02-14 – 2017-02-19 (×15): 5000 [IU] via SUBCUTANEOUS
  Filled 2017-02-14 (×13): qty 1

## 2017-02-14 MED ORDER — FENTANYL CITRATE (PF) 100 MCG/2ML IJ SOLN: 25.0000 ug | INTRAMUSCULAR | Status: DC | PRN

## 2017-02-14 MED ORDER — LIDOCAINE-PRILOCAINE 2.5-2.5 % EX CREA: 1.0000 "application " | TOPICAL_CREAM | CUTANEOUS | Status: DC | PRN

## 2017-02-14 MED ORDER — SODIUM CHLORIDE 0.9 % IV SOLN: 100.0000 mL | INTRAVENOUS | Status: DC | PRN

## 2017-02-14 MED ORDER — PROPOFOL 10 MG/ML IV BOLUS
INTRAVENOUS | Status: DC | PRN
  Administered 2017-02-14: 40 mg via INTRAVENOUS
  Administered 2017-02-14: 130 mg via INTRAVENOUS

## 2017-02-14 MED ORDER — EPHEDRINE SULFATE 50 MG/ML IJ SOLN
INTRAMUSCULAR | Status: DC | PRN
  Administered 2017-02-14: 20 mg via INTRAVENOUS

## 2017-02-14 MED ORDER — ONDANSETRON HCL 4 MG/2ML IJ SOLN
INTRAMUSCULAR | Status: DC | PRN
  Administered 2017-02-14: 4 mg via INTRAVENOUS

## 2017-02-14 MED ORDER — MORPHINE SULFATE (PF) 2 MG/ML IV SOLN: 2.0000 mg | INTRAVENOUS | Status: DC | PRN

## 2017-02-14 MED ORDER — FENTANYL CITRATE (PF) 100 MCG/2ML IJ SOLN
INTRAMUSCULAR | Status: DC | PRN
  Administered 2017-02-14: 100 ug via INTRAVENOUS
  Administered 2017-02-14 (×6): 25 ug via INTRAVENOUS

## 2017-02-14 MED ORDER — LIDOCAINE 2% (20 MG/ML) 5 ML SYRINGE
INTRAMUSCULAR | Status: DC | PRN
  Administered 2017-02-14: 80 mg via INTRAVENOUS

## 2017-02-14 MED ORDER — PHENYLEPHRINE HCL 10 MG/ML IJ SOLN
INTRAMUSCULAR | Status: DC | PRN
  Administered 2017-02-14: 120 ug via INTRAVENOUS
  Administered 2017-02-14: 160 ug via INTRAVENOUS
  Administered 2017-02-14: 80 ug via INTRAVENOUS
  Administered 2017-02-14: 160 ug via INTRAVENOUS
  Administered 2017-02-14: 120 ug via INTRAVENOUS

## 2017-02-14 MED ORDER — LIDOCAINE HCL (PF) 1 % IJ SOLN
5.0000 mL | INTRAMUSCULAR | Status: DC | PRN
Start: 2017-02-14 — End: 2017-02-15

## 2017-02-14 MED ORDER — ALTEPLASE 2 MG IJ SOLR: 2.0000 mg | Freq: Once | INTRAMUSCULAR | Status: DC | PRN

## 2017-02-14 MED ORDER — 0.9 % SODIUM CHLORIDE (POUR BTL) OPTIME
TOPICAL | Status: DC | PRN
  Administered 2017-02-14: 1000 mL

## 2017-02-14 SURGICAL SUPPLY — 49 items
BANDAGE ACE 4X5 VEL STRL LF (GAUZE/BANDAGES/DRESSINGS) ×3 IMPLANT
BANDAGE ACE 6X5 VEL STRL LF (GAUZE/BANDAGES/DRESSINGS) ×3 IMPLANT
BANDAGE ELASTIC 6 VELCRO ST LF (GAUZE/BANDAGES/DRESSINGS) ×2 IMPLANT
BLADE SAW GIGLI 510 (BLADE) ×2 IMPLANT
BLADE SAW GIGLI 510MM (BLADE) ×1
BNDG COHESIVE 6X5 TAN STRL LF (GAUZE/BANDAGES/DRESSINGS) ×3 IMPLANT
BNDG GAUZE ELAST 4 BULKY (GAUZE/BANDAGES/DRESSINGS) ×3 IMPLANT
CANISTER SUCT 3000ML PPV (MISCELLANEOUS) ×3 IMPLANT
CLIP VESOCCLUDE MED 6/CT (CLIP) ×3 IMPLANT
COVER SURGICAL LIGHT HANDLE (MISCELLANEOUS) ×3 IMPLANT
DRAIN CHANNEL 19F RND (DRAIN) IMPLANT
DRAPE HALF SHEET 40X57 (DRAPES) ×3 IMPLANT
DRAPE ORTHO SPLIT 77X108 STRL (DRAPES) ×6
DRAPE SURG ORHT 6 SPLT 77X108 (DRAPES) ×2 IMPLANT
DRSG ADAPTIC 3X8 NADH LF (GAUZE/BANDAGES/DRESSINGS) ×3 IMPLANT
ELECT CAUTERY BLADE 6.4 (BLADE) ×5 IMPLANT
ELECT REM PT RETURN 9FT ADLT (ELECTROSURGICAL) ×3
ELECTRODE REM PT RTRN 9FT ADLT (ELECTROSURGICAL) ×1 IMPLANT
EVACUATOR SILICONE 100CC (DRAIN) IMPLANT
GAUZE SPONGE 4X4 12PLY STRL (GAUZE/BANDAGES/DRESSINGS) ×3 IMPLANT
GAUZE SPONGE 4X4 12PLY STRL LF (GAUZE/BANDAGES/DRESSINGS) ×2 IMPLANT
GLOVE BIO SURGEON STRL SZ 6.5 (GLOVE) ×2 IMPLANT
GLOVE BIO SURGEON STRL SZ7.5 (GLOVE) ×3 IMPLANT
GLOVE BIO SURGEONS STRL SZ 6.5 (GLOVE) ×2
GLOVE BIOGEL PI IND STRL 6.5 (GLOVE) IMPLANT
GLOVE BIOGEL PI INDICATOR 6.5 (GLOVE) ×2
GLOVE SURG SS PI 7.0 STRL IVOR (GLOVE) ×2 IMPLANT
GOWN STRL REUS W/ TWL LRG LVL3 (GOWN DISPOSABLE) ×2 IMPLANT
GOWN STRL REUS W/ TWL XL LVL3 (GOWN DISPOSABLE) ×1 IMPLANT
GOWN STRL REUS W/TWL LRG LVL3 (GOWN DISPOSABLE) ×3
GOWN STRL REUS W/TWL XL LVL3 (GOWN DISPOSABLE) ×6
KIT BASIN OR (CUSTOM PROCEDURE TRAY) ×3 IMPLANT
KIT ROOM TURNOVER OR (KITS) ×3 IMPLANT
NS IRRIG 1000ML POUR BTL (IV SOLUTION) ×3 IMPLANT
PACK GENERAL/GYN (CUSTOM PROCEDURE TRAY) ×3 IMPLANT
PAD ARMBOARD 7.5X6 YLW CONV (MISCELLANEOUS) ×6 IMPLANT
PENCIL BUTTON HOLSTER BLD 10FT (ELECTRODE) ×2 IMPLANT
STAPLER VISISTAT 35W (STAPLE) ×7 IMPLANT
STOCKINETTE IMPERVIOUS LG (DRAPES) ×3 IMPLANT
SUT ETHILON 3 0 PS 1 (SUTURE) IMPLANT
SUT SILK 0 TIES 10X30 (SUTURE) ×3 IMPLANT
SUT SILK 2 0 (SUTURE) ×3
SUT SILK 2-0 18XBRD TIE 12 (SUTURE) ×1 IMPLANT
SUT SILK 3 0 (SUTURE)
SUT SILK 3-0 18XBRD TIE 12 (SUTURE) IMPLANT
SUT VIC AB 2-0 CT1 18 (SUTURE) ×8 IMPLANT
TOWEL GREEN STERILE (TOWEL DISPOSABLE) ×3 IMPLANT
UNDERPAD 30X30 (UNDERPADS AND DIAPERS) ×3 IMPLANT
WATER STERILE IRR 1000ML POUR (IV SOLUTION) ×3 IMPLANT

## 2017-02-14 NOTE — Progress Notes (Signed)
PROGRESS NOTE    [name]  ZOX:[id number] DOB: 21-Dec-1935 DOA: 02/10/2017 PCP: Crist Fat, MD    Brief Narrative: 58 transferred from [facility] with AMS and gangrene foot.  Vascular planned to perform right AKA as soon as informed consent is obtained, as he is unable to give same.  He also has ESRD and has been on HD MWF, along with anemia.  He is currently on Van/Fortaz, and mental status improved marginally.     Assessment & Plan:   Principal Problem:   Gangrene of foot (HCC) Active Problems:   End stage renal disease (HCC)   Hypertension   Acute metabolic encephalopathy   Diabetes mellitus with complication (HCC)   Pressure injury of skin  Gangrene of the bilateral feet, right greater than left.   -  Vascular surgery assistance appreciated -  Continue vancomycin and Fortaz  Acute metabolic encephalopathy, likely due to gangrene, marginally improved.   -  Continue antibiotics  ESRD -  Nephrology consulting and patient receiving intermittent HD  Hypertension, currently on no medications  Diabetes mellitus type 2, on no medications -  Patient refusing CBG/insulin  Right neck central line (patient refused to let me examine this morning), site appears non-erythematous, dressing is clean -  CXR prev shows it is short but the tip is no longer twisted -  D/c PICC line   DVT prophylaxis:  heparin Code Status:  Full  Family Communication:  Patient alone Disposition Plan:  Pending amputation  Consultants:   Vascular surgery.   Procedures: None.    Antimicrobials: Anti-infectives    Start     Dose/Rate Route Frequency Ordered Stop   02/13/17 2000  cefTAZidime (FORTAZ) 2 g in dextrose 5 % 50 mL IVPB     2 g 100 mL/hr over 30 Minutes Intravenous Every M-W-F (2000) 02/12/17 1035     02/13/17 1711  vancomycin (VANCOCIN) 1-5 GM/200ML-% IVPB    Comments:  [name]   : cabinet override      02/13/17 1711 02/13/17 1753   02/12/17 0930   cefUROXime (ZINACEF) 1.5 g in dextrose 5 % 50 mL IVPB     1.5 g 100 mL/hr over 30 Minutes Intravenous To Surgery 02/11/17 1706 02/13/17 0930   02/12/17 0926  cefUROXime (ZINACEF) 1.5 g in dextrose 5 % 50 mL IVPB     1.5 g 100 mL/hr over 30 Minutes Intravenous 30 min pre-op 02/11/17 1757 02/14/17 1335   02/11/17 1200  vancomycin (VANCOCIN) IVPB 1000 mg/200 mL premix     1,000 mg 200 mL/hr over 60 Minutes Intravenous Every M-W-F (Hemodialysis) 02/10/17 1355     02/10/17 1400  vancomycin (VANCOCIN) 2,000 mg in sodium chloride 0.9 % 500 mL IVPB     2,000 mg 250 mL/hr over 120 Minutes Intravenous  Once 02/10/17 1355 02/10/17 1900   02/10/17 1400  cefTAZidime (FORTAZ) 1 g in dextrose 5 % 50 mL IVPB     1 g 100 mL/hr over 30 Minutes Intravenous Daily 02/10/17 1356 02/12/17 1735   02/10/17 0930  cefTAZidime (FORTAZ) 1 g in dextrose 5 % 50 mL IVPB  Status:  Discontinued     1 g 100 mL/hr over 30 Minutes Intravenous Every 24 hours 02/10/17 0845 02/10/17 1356   02/10/17 0930  vancomycin (VANCOCIN) 2,000 mg in sodium chloride 0.9 % 500 mL IVPB  Status:  Discontinued     2,000 mg 250 mL/hr over 120 Minutes Intravenous  Once 02/10/17 0845 02/10/17 1355  Subjective:  I am fine.   Objective: Vitals:   02/14/17 1530 02/14/17 1543 02/14/17 1545 02/14/17 1713  BP:  (!) 104/48  (!) 124/47  Pulse: 76 75 74 73  Resp: (!) Temp:   98.7 F (37.1 C) 98.2 F (36.8 C)  TempSrc:    Oral  SpO2: 92% 94% 95% 100%  Weight:      Height:        Intake/Output Summary (Last 24 hours) at 02/14/17 1754 Last data filed at 02/14/17 1600  Gross per 24 hour  Intake             1390 ml  Output              200 ml  Net             1190 ml   Filed Weights   02/13/17 1300 02/13/17 1732 02/13/17 2224  Weight: 96 kg (211 lb 10.3 oz) 94.5 kg (208 lb 5.4 oz) 94.5 kg (208 lb 5.4 oz)    Examination:  General exam: Appears calm and comfortable  Respiratory system: Clear to auscultation. Respiratory  effort normal. Cardiovascular system: S1 & S2 heard, RRR. No JVD, murmurs, rubs, gallops or clicks. No pedal edema. Gastrointestinal system: Abdomen is nondistended, soft and nontender. No organomegaly or masses felt. Normal bowel sounds heard. Central nervous system: Alert and oriented. No focal neurological deficits. Extremities:   R aka Skin: No rashes, lesions or ulcers Psychiatry: Judgement and insight appear normal. Mood & affect appropriate.   Data Reviewed: I have personally reviewed following labs and imaging studies  CBC:  Recent Labs Lab 02/10/17 0403 02/11/17 0816 02/12/17 0003 02/13/17 0710  WBC 10.2 10.2 10.5 8.6  HGB 9.6* 9.7* 9.6* 9.5*  HCT 30.1* 30.1* 30.0* 28.5*  MCV 94.7 93.8 94.9 94.1  PLT 282 267 270 253   Basic Metabolic Panel:  Recent Labs Lab 02/10/17 0403 02/11/17 0816 02/12/17 0003 02/13/17 0710 02/13/17 1851 02/14/17 0831  NA 136 138 135 135  --  136  K 4.1 4.4 3.7 4.0 4.5 4.3  CL 91* 94* 96* 97*  --  96*  CO2 --  29  GLUCOSE 69 75 189* 86  --  89  BUN 33* 42* 21* 29*  --  15  CREATININE 9.43* 11.49* 7.49* 10.15*  --  6.24*  CALCIUM 8.6* 8.4* 8.6* 8.8*  --  9.3  PHOS  --  5.4*  --  4.9*  --   --    GFR: Estimated Creatinine Clearance: 11.1 mL/min (A) (by C-G formula based on SCr of 6.24 mg/dL (H)). Liver Function Tests:  Recent Labs Lab 02/10/17 0403 02/11/17 0816 02/13/17 0710  AST 26  --   --   ALT 27  --   --   ALKPHOS 114  --   --   BILITOT 0.8  --   --   PROT 6.6  --   --   ALBUMIN 2.7* 2.4* 2.5*   CBG:  Recent Labs Lab 02/13/17 2258 02/14/17 0747 02/14/17 1144 02/14/17 1525 02/14/17 1603  GLUCAP 145* 90 105* 92 90     Recent Results (from the past 240 hour(s))  MRSA PCR Screening     Status: Abnormal   Collection Time: 02/10/17  3:33 AM  Result Value Ref Range Status   MRSA by PCR POSITIVE (A) NEGATIVE Final    Comment:        The GeneXpert  MRSA Assay (FDA approved for NASAL specimens only),  is one component of a comprehensive MRSA colonization surveillance program. It is not intended to diagnose MRSA infection nor to guide or monitor treatment for MRSA infections. RESULT CALLED TO, READ BACK BY AND VERIFIED WITH: IRBY,T RN [phone] AT [phone] [facility]   Surgical pcr screen     Status: None   Collection Time: 02/12/17  5:09 AM  Result Value Ref Range Status   MRSA, PCR NEGATIVE NEGATIVE Final   Staphylococcus aureus NEGATIVE NEGATIVE Final    Comment: (NOTE) The Xpert SA Assay (FDA approved for NASAL specimens in patients 25 years of age and older), is one component of a comprehensive surveillance program. It is not intended to diagnose infection nor to guide or monitor treatment.      Radiology Studies: No results found.  Scheduled Meds: . allopurinol  100 mg Oral QPM  . aspirin EC  81 mg Oral QPM  . calcitRIOL  0.25 mcg Oral Q M,W,F-HD  . cyanocobalamin  250 mcg Oral QPM  . [START ON 02/23/2017] darbepoetin (ARANESP) injection - DIALYSIS  60 mcg Intravenous Q Sat-HD  . feeding supplement (NEPRO CARB STEADY)  237 mL Oral BID BM  . heparin  5,000 Units Subcutaneous Q8H  . multivitamin  1 tablet Oral QHS  . sucroferric oxyhydroxide  1,000 mg Oral TID WC   Continuous Infusions: . sodium chloride    . sodium chloride    . cefTAZidime (FORTAZ)  IV Stopped (02/13/17 2157)  . vancomycin 1,000 mg (02/13/17 1635)     LOS: 4 days   Haydee Salter, MD [facility].   If 7PM-7AM, please contact night-coverage [email or web address] Password [facility] 02/14/2017, 5:54 PM

## 2017-02-14 NOTE — Clinical Social Work Note (Signed)
Clinical Social Work Assessment  Patient Details  Name: [name] MRN: [id number] Date of Birth: 10-Mar-1936  Date of referral:  02/14/17               Reason for consult:  Facility Placement                Permission sought to share information with:  Facility Medical sales representative, Family Supports Permission granted to share information::  Yes, Verbal Permission Granted  Name::     Software engineer::  SNF  Relationship::  Wife  Contact Information:     Housing/Transportation Living arrangements for the past 2 months:  Single Family Home Source of Information:  Spouse Patient Interpreter Needed:  None Criminal Activity/Legal Involvement Pertinent to Current Situation/Hospitalization:  No - Comment as needed Significant Relationships:  Spouse, Adult Children Lives with:  Self, Spouse Do you feel safe going back to the place where you live?  Yes Need for family participation in patient care:  Yes (Comment) (patient not oriented)  Care giving concerns:  Patient has been living at home with spouse, but spouse is unable to provide support needed for patient at this time because of multiple health issues of her own. Patient will benefit from short term rehab before returning home in order to maximize independence.   Social Worker assessment / plan:  CSW spoke with patient's wife, [name], over the phone to discuss discharge planning and recommendation for SNF. Patient's wife discussed that patient had been to SNF at [facility] but it was not a pleasant experience and was not helpful. CSW asked about whether she wanted to take the patient home instead, and wife acknowledged that she is not able to take care of him at this time because she is also struggling with her own health. Patient's wife requested SNF placement as close to [facility] as possible, except for [facility]. CSW to fax out referral and follow up with bed offers.  Employment status:  Retired Automotive engineer PT Recommendations:  Skilled Nursing Facility Information / Referral to community resources:     Patient/Family's Response to care:  Patient's response unable to be assessed due to disorientation; patient's wife agreeable to SNF placement.  Patient/Family's Understanding of and Emotional Response to Diagnosis, Current Treatment, and Prognosis:  Patient's wife is aware that the patient needs additional help at this time and that she is not physically able to manage the patient's needs at this time with her own health needs. Patient's wife indicated understanding of CSW role in discharge planning and appreciated assistance.  Emotional Assessment Appearance:  Appears stated age Attitude/Demeanor/Rapport:  Unable to Assess Affect (typically observed):  Unable to Assess Orientation:  Oriented to Self Alcohol / Substance use:  Not Applicable Psych involvement (Current and /or in the community):  No (Comment)  Discharge Needs  Concerns to be addressed:  Care Coordination Readmission within the last 30 days:  Yes Current discharge risk:  Physical Impairment, Cognitively Impaired Barriers to Discharge:  Continued Medical Work up, Insurance Authorization   Baldemar Lenis, LCSW 02/14/2017, 11:13 AM

## 2017-02-14 NOTE — Progress Notes (Signed)
  Progress Note    02/14/2017 12:54 PM Day of Surgery  Subjective:  No new issues  Vitals:   02/14/17 0900 02/14/17 1100  BP: (!) 138/40 (!) 130/44  Pulse: 80 75  Resp: 18 16  Temp: 97.9 F (36.6 C)   SpO2: 100% 100%    Physical Exam: Awake and alert Gangrene of most of his right toes  CBC    Component Value Date/Time   WBC 8.6 02/13/2017 0710   RBC 3.03 (L) 02/13/2017 0710   HGB 9.5 (L) 02/13/2017 0710   HCT 28.5 (L) 02/13/2017 0710   PLT 253 02/13/2017 0710   MCV 94.1 02/13/2017 0710   MCH 31.4 02/13/2017 0710   MCHC 33.3 02/13/2017 0710   RDW 15.5 02/13/2017 0710   LYMPHSABS 1.3 01/25/2017 1355   MONOABS 0.4 01/25/2017 1355   EOSABS 0.3 01/25/2017 1355   BASOSABS 0.0 01/25/2017 1355    BMET    Component Value Date/Time   NA 136 02/14/2017 0831   K 4.3 02/14/2017 0831   CL 96 (L) 02/14/2017 0831   CO2 29 02/14/2017 0831   GLUCOSE 89 02/14/2017 0831   BUN 15 02/14/2017 0831   CREATININE 6.24 (H) 02/14/2017 0831   CALCIUM 9.3 02/14/2017 0831   CALCIUM 8.4 11/29/2007 1515   GFRNONAA 8 (L) 02/14/2017 0831   GFRAA 9 (L) 02/14/2017 0831    INR    Component Value Date/Time   INR 1.10 01/01/2017 1429     Intake/Output Summary (Last 24 hours) at 02/14/17 1254 Last data filed at 02/14/17 0900  Gross per 24 hour  Intake              490 ml  Output             1500 ml  Net            -1010 ml     Assessment:  81 y.o. male is s/p attempted revascularization of right lower extremity with progressive dry gangrene.   Plan: Right above knee amputation I obtained consent via telephone yesterday from wife Tyler Aas and spoke with her again this a.m.Marland Kitchen She understands he will likely never walk again following amputation and will possibly have similar outcome on the left.    Dorette Hartel C. Randie Heinz, MD [facility] Office: [phone] Pager: [phone]  02/14/2017 12:54 PM

## 2017-02-14 NOTE — Plan of Care (Signed)
Problem: Education: Goal: Knowledge of disease and its progression will improve Outcome: Not Progressing Patient needs to continued education for right AKA being performed today and management of kidney disease and diabetes.

## 2017-02-14 NOTE — Anesthesia Preprocedure Evaluation (Signed)
Anesthesia Evaluation  Patient identified by MRN, date of birth, ID band Patient awake    Reviewed: Allergy & Precautions, NPO status , Patient's Chart, lab work & pertinent test results  Airway Mallampati: II  TM Distance: >3 FB     Dental   Pulmonary pneumonia,    breath sounds clear to auscultation       Cardiovascular hypertension, + angina + CAD, + Past MI, + Cardiac Stents, + Peripheral Vascular Disease and +CHF   Rhythm:Regular Rate:Normal     Neuro/Psych    GI/Hepatic negative GI ROS, Neg liver ROS,   Endo/Other  diabetes  Renal/GU Renal disease     Musculoskeletal   Abdominal   Peds  Hematology   Anesthesia Other Findings   Reproductive/Obstetrics                             Anesthesia Physical Anesthesia Plan  ASA: III  Anesthesia Plan: General   Post-op Pain Management:    Induction: Intravenous  PONV Risk Score and Plan: 2 and Ondansetron, Dexamethasone and Treatment may vary due to age or medical condition  Airway Management Planned: Oral ETT  Additional Equipment:   Intra-op Plan:   Post-operative Plan: Possible Post-op intubation/ventilation  Informed Consent: I have reviewed the patients History and Physical, chart, labs and discussed the procedure including the risks, benefits and alternatives for the proposed anesthesia with the patient or authorized representative who has indicated his/her understanding and acceptance.   Dental advisory given  Plan Discussed with: CRNA and Anesthesiologist  Anesthesia Plan Comments:         Anesthesia Quick Evaluation

## 2017-02-14 NOTE — Anesthesia Postprocedure Evaluation (Signed)
Anesthesia Post Note  Patient: [name]  Procedure(s) Performed: Procedure(s) (LRB): RIGHT ABOVE KNEE AMPUTATION (Right)     Patient location during evaluation: PACU Anesthesia Type: General Level of consciousness: awake Pain management: pain level controlled Vital Signs Assessment: post-procedure vital signs reviewed and stable Respiratory status: spontaneous breathing Cardiovascular status: stable Anesthetic complications: no    Last Vitals:  Vitals:   02/14/17 1430 02/14/17 1443  BP: (!) 98/43 (!) 96/47  Pulse: 76 71  Resp: 11 10  Temp:    SpO2: 97% 94%    Last Pain:  Vitals:   02/14/17 1430  TempSrc:   PainSc: 0-No pain                 Alexei Doswell Jolin

## 2017-02-14 NOTE — Progress Notes (Signed)
[facility] Progress Note   Dialysis Orders: [facility] MWF4.25 hr 450/800 2 K 2 Ca EDW 100.5 - profile 2 left lower AVGG heparin 7500 Mircera 50 q 2 wks - last 9/22, calcitriol 0.25 Parsabiv 5 Recent labs: hgb 10.8 29% sat iPTH 1780 Ca 9.8 P 6.8  Assessment/Plan: 1. Bilateral infected and ischemic toes R >L;needs amputation- per Dr. Darrick Penna note he is on the OR schedule for next week; on empiric Vanc and Fortaz;  He is requiring hand mittens at times (not at present) due to removing monitor 2. ESRD- MWF -next HD Friday - very restless during treatment Wed - wanted to sign off but eventually ran full treatment. 3. Hypertension/volume- BP better net UF 3L Monday and 1.5 L Wed - no BP drops with HD Wed- post wt 94.5 - bed scale weights signifcantly below edw - ? accurate 4. Anemia- hgb drifting down 9.5 stable ; for ESA redose next week 5. Metabolic bone disease- Continue calcitriol and binders; Parsabiv not avail in hospital Ca/P ok 6. Nutrition- renal diet/multivitamin/Nepro alb 2.5 - intake very poor - NPo for surgery today 7. Disp - likely will need SNF - need to address goals of care  Sheffield Slider, PA-C [facility] Beeper [phone] 02/14/2017,7:56 AM  LOS: 4 days   Subjective:   Told by RN that his wife gave consent for surgery Complaining about being woken up  Objective Vitals:   02/13/17 1700 02/13/17 1732 02/13/17 2224 02/14/17 0609  BP: 122/82 (!) 137/55 (!) 139/41 (!) 164/60  Pulse: 82 84 85 79  Resp: Temp:  98.3 F (36.8 C)  (!) 97.4 F (36.3 C)  TempSrc:  Oral    SpO2:  95% 96% 100%  Weight:  94.5 kg (208 lb 5.4 oz) 94.5 kg (208 lb 5.4 oz)   Height:       Physical Exam General: sleeping, rouses easily. Heart: RRR Lungs: no rales Abdomen: soft obese NT Extremities: no LE edema - foot wounds stable necrotic toes R> L  Dialysis Access: left lower AVGG + bruit   Additional Objective Labs: Basic Metabolic  Panel:  Recent Labs Lab 02/11/17 0816 02/12/17 0003 02/13/17 0710 02/13/17 1851  NA 138 135 135  --   K 4.4 3.7 4.0 4.5  CL 94* 96* 97*  --   CO2 --   GLUCOSE 75 189* 86  --   BUN 42* 21* 29*  --   CREATININE 11.49* 7.49* 10.15*  --   CALCIUM 8.4* 8.6* 8.8*  --   PHOS 5.4*  --  4.9*  --    Liver Function Tests:  Recent Labs Lab 02/10/17 0403 02/11/17 0816 02/13/17 0710  AST 26  --   --   ALT 27  --   --   ALKPHOS 114  --   --   BILITOT 0.8  --   --   PROT 6.6  --   --   ALBUMIN 2.7* 2.4* 2.5*   CBC:  Recent Labs Lab 02/10/17 0403 02/11/17 0816 02/12/17 0003 02/13/17 0710  WBC 10.2 10.2 10.5 8.6  HGB 9.6* 9.7* 9.6* 9.5*  HCT 30.1* 30.1* 30.0* 28.5*  MCV 94.7 93.8 94.9 94.1  PLT 282 267 270 253   CBG:  Recent Labs Lab 02/12/17 1653 02/12/17 2024 02/13/17 0752 02/13/17 1143 02/13/17 2258  GLUCAP 90 83 83 90 145*   Medications: . sodium chloride    . cefTAZidime (FORTAZ)  IV Stopped (02/13/17 2157)  . cefUROXime (ZINACEF)  IV    . vancomycin 1,000 mg (02/13/17 1635)   . allopurinol  100 mg Oral QPM  . aspirin EC  81 mg Oral QPM  . calcitRIOL  0.25 mcg Oral Q M,W,F-HD  . chlorhexidine  60 mL Topical Once  . chlorhexidine  60 mL Topical Once  . cyanocobalamin  250 mcg Oral QPM  . [START ON 02/23/2017] darbepoetin (ARANESP) injection - DIALYSIS  60 mcg Intravenous Q Sat-HD  . feeding supplement (NEPRO CARB STEADY)  237 mL Oral BID BM  . heparin  5,000 Units Subcutaneous Q8H  . multivitamin  1 tablet Oral QHS  . sucroferric oxyhydroxide  1,000 mg Oral TID WC    I have seen and examined this patient and agree with plan and assessment in the above note with renal recommendations/intervention highlighted.  Pt for BKA today per Dr. Randie Heinz.  Continue with HD qMWF while he remains an inpatient.  Pt will likely require SNF or CIR prior to discharge due to his poor functional status. Jomarie Longs A Kynslie Ringle,MD 02/14/2017 10:56 AM

## 2017-02-14 NOTE — Transfer of Care (Signed)
Immediate Anesthesia Transfer of Care Note  Patient: [name]  Procedure(s) Performed: Procedure(s): RIGHT ABOVE KNEE AMPUTATION (Right)  Patient Location: PACU  Anesthesia Type:General  Level of Consciousness: awake, oriented and patient cooperative  Airway & Oxygen Therapy: Patient Spontanous Breathing and Patient connected to nasal cannula oxygen  Post-op Assessment: Report given to RN, Post -op Vital signs reviewed and stable and Patient moving all extremities  Post vital signs: Reviewed and stable  Last Vitals:  Vitals:   02/14/17 1428 02/14/17 1430  BP: (!) 98/43 (!) 98/43  Pulse: 75 76  Resp: 18 11  Temp: (!) 36.4 C   SpO2: 100% 97%    Last Pain:  Vitals:   02/14/17 0900  TempSrc: Oral  PainSc:       Patients Stated Pain Goal: 0 (02/12/17 1840)  Complications: No apparent anesthesia complications

## 2017-02-14 NOTE — Progress Notes (Signed)
Thank you for consult on Mr. [name]. Chart reviewed and note that patient has been at [facility] and rehab since last admission. He has failed attempts at limb salvage and required R-AKA today. Would recommend resuming therapy at [facility] after discharge. Will defer consult for now.

## 2017-02-14 NOTE — Anesthesia Procedure Notes (Signed)
Procedure Name: LMA Insertion Date/Time: 02/14/2017 1:30 PM Performed by: Faustino Congress Tait Balistreri Pre-anesthesia Checklist: Patient identified, Emergency Drugs available, Suction available and Patient being monitored Patient Re-evaluated:Patient Re-evaluated prior to induction Oxygen Delivery Method: Circle System Utilized Preoxygenation: Pre-oxygenation with 100% oxygen Induction Type: IV induction LMA: LMA inserted LMA Size: 5.0 Number of attempts: 1 Placement Confirmation: positive ETCO2 Tube secured with: Tape Dental Injury: Teeth and Oropharynx as per pre-operative assessment

## 2017-02-14 NOTE — Op Note (Signed)
    Patient name: [name] MRN: [id number] DOB: [birth year] Sex: male  02/14/2017 Pre-operative Diagnosis: right foot gangrene Post-operative diagnosis:  Same Surgeon:  Apolinar Junes C. Randie Heinz, MD Assistant: Doreatha Massed, PA Procedure Performed: Right above knee amputation  Indications:  81 year old male with history end-stage renal disease and has had progressive gangrene of his right greater than left toes. He has undergone attempted endovascular revascularization of his right lower extremity that failed and is now indicated for above knee amputation for worsening gangrenous changes to the right foot.  Findings: All muscle appeared viable. The SFA was severely calcified and was endarterectomized for ligation. Anterior posterior flaps approximated well.   Procedure:  The patient was identified in the holding area and taken to the operating room where his placed supine on the operating table and general anesthesia was induced. He was given antibiotics sterilely prepped and draped in the right lower extremity and timeout called. We began with fishmouth type incision using 10 blade just above the knee. We used electrocautery dissected down to the fascia and muscle to the level of the above. Periosteum was elevated the bone was transected with an anterior bevel. Amputation knife was used. A posterior flap. The neurovascular bundle was clamped and these SFA was severely calcified and some calcium was removed. It was then suture ligated with 2-0 Vicryl stitch. The nerve was pulled on tension ligated and divided. Hemostasis was obtained in the wound and it was irrigated. Bone edge was smooth with a bone rasp. Flaps were then reapproximated with 2-0 Vicryl suture and the skin closed with staples. All counts were correct at completion patient tolerated without immediate complication.     Ozzie Remmers C. Randie Heinz, MD [facility] [place] Office: [phone] Pager: [phone]

## 2017-02-14 NOTE — H&P (Signed)
Patient unwilling to answer questions at this time  

## 2017-02-15 ENCOUNTER — Telehealth: Payer: Self-pay | Admitting: Vascular Surgery

## 2017-02-15 ENCOUNTER — Encounter (HOSPITAL_COMMUNITY): Payer: Self-pay | Admitting: Vascular Surgery

## 2017-02-15 DIAGNOSIS — E118 Type 2 diabetes mellitus with unspecified complications: Secondary | ICD-10-CM

## 2017-02-15 LAB — BASIC METABOLIC PANEL
Anion gap: 10 (ref 5–15)
BUN: 23 mg/dL — ABNORMAL HIGH (ref 6–20)
CHLORIDE: 95 mmol/L — AB (ref 101–111)
CO2: 30 mmol/L (ref 22–32)
Calcium: 9 mg/dL (ref 8.9–10.3)
Creatinine, Ser: 7.82 mg/dL — ABNORMAL HIGH (ref 0.61–1.24)
GFR calc non Af Amer: 6 mL/min — ABNORMAL LOW (ref >60)
GFR, EST AFRICAN AMERICAN: 7 mL/min — AB (ref >60)
Glucose, Bld: 165 mg/dL — ABNORMAL HIGH (ref 65–99)
Potassium: 4.4 mmol/L (ref 3.5–5.1)
SODIUM: 135 mmol/L (ref 135–145)

## 2017-02-15 LAB — GLUCOSE, CAPILLARY
Glucose-Capillary: 131 mg/dL — ABNORMAL HIGH (ref 65–99)
Glucose-Capillary: 118 mg/dL — ABNORMAL HIGH (ref 65–99)
Glucose-Capillary: 87 mg/dL (ref 65–99)

## 2017-02-15 LAB — CBC
HEMATOCRIT: 26.3 % — AB (ref 39.0–52.0)
HEMOGLOBIN: 8.4 g/dL — AB (ref 13.0–17.0)
MCH: 30.2 pg (ref 26.0–34.0)
MCHC: 31.9 g/dL (ref 30.0–36.0)
MCV: 94.6 fL (ref 78.0–100.0)
Platelets: 250 10*3/uL (ref 150–400)
RBC: 2.78 MIL/uL — AB (ref 4.22–5.81)
RDW: 15.4 % (ref 11.5–15.5)
WBC: 9.8 10*3/uL (ref 4.0–10.5)

## 2017-02-15 MED ORDER — CALCITRIOL 0.25 MCG PO CAPS
ORAL_CAPSULE | ORAL | Status: AC
  Filled 2017-02-15: qty 1

## 2017-02-15 MED ORDER — OXYCODONE HCL 5 MG PO TABS
ORAL_TABLET | ORAL | Status: AC
  Filled 2017-02-15: qty 2

## 2017-02-15 MED ORDER — VANCOMYCIN HCL IN DEXTROSE 1-5 GM/200ML-% IV SOLN
INTRAVENOUS | Status: AC
  Administered 2017-02-15: 1000 mg via INTRAVENOUS
  Filled 2017-02-15: qty 200

## 2017-02-15 MED ORDER — POLYETHYLENE GLYCOL 3350 17 G PO PACK
17.0000 g | PACK | Freq: Once | ORAL | Status: AC
  Administered 2017-02-15: 17 g via ORAL
  Filled 2017-02-15: qty 1

## 2017-02-15 NOTE — Telephone Encounter (Signed)
Sched appt 03/15/17 at 12:45. Spoke to pt's wife.

## 2017-02-15 NOTE — Progress Notes (Signed)
PT Cancellation Note  Patient Details Name: [name] MRN: [id number] DOB: 1935-11-04   Cancelled Treatment:    Reason Eval/Treat Not Completed: Patient at procedure or test/unavailable. Pt in HD. PT to re-attempt Re-evaluation following AKA as time allows.   Ilda Foil 02/15/2017, 8:29 AM

## 2017-02-15 NOTE — Progress Notes (Signed)
Pharmacy Antibiotic Note  [name] is a 81 y.o. male admitted on 02/10/2017 with wound infection/gangrenous toes.  Pharmacy has been consulted for ceftazidime and vancomycin dosing.  The patient is ESRD and receives HD on MWF - remains on schedule, currently in HD now.  Patient is s/p R AKA yesterday. Discussed with Dr. Randie Heinz on the phone today- L foot will not improve with antibiotics and source control achieved with R AKA> can stop antibiotics after today's doses.  Plan: Vancomycin 1g/HD-MWF and Ceftazidime 2g IV qMWF- last doses today Pharmacy to sign off as antibiotic course complete. Please re-consult if needed.  Height:  (177.8 cm) Weight: 199 lb 11.8 oz (90.6 kg) IBW/kg (Calculated) : 73  Temp (24hrs), Avg:98.2 F (36.8 C), Min:97.5 F (36.4 C), Max:98.7 F (37.1 C)   Recent Labs Lab 02/11/17 0816 02/12/17 0003 02/13/17 0710 02/14/17 0831 02/14/17 2115 02/15/17 0213  WBC 10.2 10.5 8.6  --  11.1* 9.8  CREATININE 11.49* 7.49* 10.15* 6.24* 7.46* 7.82*    Estimated Creatinine Clearance: 8.7 mL/min (A) (by C-G formula based on SCr of 7.82 mg/dL (H)).    Allergies  Allergen Reactions  . Iohexol Swelling and Rash  . Zanaflex [Tizanidine] Other (See Comments)    Shut kidneys down Unable to walk or void  . Ezetimibe-Simvastatin Other (See Comments)    Elevated liver enzymes  . Fluvastatin Other (See Comments)    Elevated liver enzymes  . Simvastatin Other (See Comments)    Elevated liver enzymes    Antimicrobials this admission: Vanc 9/23 >>9/28 * 2g LD given 9/23 Ceftax 9/23>>9/28   Thank you for allowing pharmacy to be a part of this patient's care.  Miquel Lamson D. Alonie Gazzola, PharmD, BCPS Clinical Pharmacist Pager: [phone] Clinical Phone for 02/15/2017 until 3:30pm: [phone] If after 3:30pm, please call main pharmacy at [phone] 02/15/2017 11:20 AM

## 2017-02-15 NOTE — Procedures (Signed)
I was present at this dialysis session. I have reviewed the session itself and made appropriate changes.   Filed Weights   02/13/17 2224 02/14/17 2152 02/15/17 0740  Weight: 94.5 kg (208 lb 5.4 oz) 92.2 kg (203 lb 4.2 oz) 90.6 kg (199 lb 11.8 oz)     Recent Labs Lab 02/14/17 2115 02/15/17 0213  NA 136 135  K 4.4 4.4  CL 96* 95*  CO2 31 30  GLUCOSE 134* 165*  BUN 19 23*  CREATININE 7.46* 7.82*  CALCIUM 9.1 9.0  PHOS 4.3  --      Recent Labs Lab 02/13/17 0710 02/14/17 2115 02/15/17 0213  WBC 8.6 11.1* 9.8  HGB 9.5* 9.0* 8.4*  HCT 28.5* 27.7* 26.3*  MCV 94.1 94.5 94.6  PLT 253 263 250    Scheduled Meds: . allopurinol  100 mg Oral QPM  . aspirin EC  81 mg Oral QPM  . calcitRIOL  0.25 mcg Oral Q M,W,F-HD  . cyanocobalamin  250 mcg Oral QPM  . [START ON 02/23/2017] darbepoetin (ARANESP) injection - DIALYSIS  60 mcg Intravenous Q Sat-HD  . feeding supplement (NEPRO CARB STEADY)  237 mL Oral BID BM  . heparin  5,000 Units Subcutaneous Q8H  . multivitamin  1 tablet Oral QHS  . sucroferric oxyhydroxide  1,000 mg Oral TID WC   Continuous Infusions: . sodium chloride    . sodium chloride    . cefTAZidime (FORTAZ)  IV Stopped (02/13/17 2157)  . vancomycin 1,000 mg (02/13/17 1635)   PRN Meds:.sodium chloride, sodium chloride, acetaminophen **OR** acetaminophen, alteplase, diphenhydrAMINE, heparin, lidocaine (PF), lidocaine-prilocaine, morphine injection, ondansetron **OR** ondansetron (ZOFRAN) IV, oxyCODONE, pentafluoroprop-tetrafluoroeth, sodium chloride flush    Dialysis Orders: [facility] MWF4.25 hr 450/800 2 K 2 Ca EDW 100.5 - profile 2 left lower AVGG heparin 7500 Mircera 50 q 2 wks - last 9/22, calcitriol 0.25 Parsabiv 5 Recent labs: hgb 10.8 29% sat iPTH 1780 Ca 9.8 P 6.8  Assessment/Plan: 1. Bilateral infected and ischemic toes R >L;s/p right AKA on empiric Vanc and Fortaz;   2. ESRD- continue with MWF schedule  3. Hypertension/volume- BP better net UF 3L  Monday and 1.5 L Wed- no BP drops with HD Wed- post wt 94.5 - bed scale weights signifcantly below edw - ? accurate 4. Anemia- hgb drifting down post-op, transfuse prn. 5. Metabolic bone disease- Continue calcitriol and binders; Parsabiv not avail in hospital Ca/P ok 6. Nutrition- renal diet/multivitamin/Nepro alb 2.5 - intake very poor - NPo for surgery today 7. Disp - likely will need SNF - need to address goals of care  Irena Cords,  MD 02/15/2017, 8:27 AM

## 2017-02-15 NOTE — Progress Notes (Signed)
OT Cancellation Note  Patient Details Name: [name] MRN: [id number] DOB: 10/21/1935   Cancelled Treatment:    Reason Eval/Treat Not Completed: Patient at procedure or test/ unavailable. Pt currently in hemodialysis. Will attempt eval at a later time.  Evette Georges [phone] 02/15/2017, 8:43 AM

## 2017-02-15 NOTE — Telephone Encounter (Signed)
-----   Message from Phillips Odor, RN sent at 02/14/2017  4:57 PM EDT ----- Regarding: needs 4 wk. f/u with Dr. Randie Heinz   ----- Message ----- From: Garth Schlatter Sent: 02/14/2017   2:24 PM To: Vvs Charge Pool  S/p right AKA.  F/u with Randie Heinz in 4 weeks

## 2017-02-15 NOTE — Progress Notes (Signed)
PROGRESS NOTE    [name]  ZOX:[id number] DOB: 07-Nov-1935 DOA: 02/10/2017 PCP: Crist Fat, MD    Brief Narrative: 17 transferred from [facility] with AMS and gangrene foot.  Vascular performed AKA.  He also has ESRD and has been on HD MWF, along with anemia.  He is currently on Van/Fortaz, and mental status improved marginally again. Stop antibiotics tomorrow.     Assessment & Plan:   Principal Problem:   Gangrene of foot (HCC) Active Problems:   End stage renal disease (HCC)   Hypertension   Acute metabolic encephalopathy   Diabetes mellitus with complication (HCC)   Pressure injury of skin  Gangrene of the bilateral feet, right greater than left.   -  Vascular surgery assistance appreciated -  Continue vancomycin and Fortaz  Acute metabolic encephalopathy, likely due to gangrene, marginally improved.   -  Continue antibiotics, stop tomorrow  ESRD -  Nephrology consulting and patient receiving intermittent HD  Hypertension, currently on no medications  Diabetes mellitus type 2, on no medications -  Patient refusing CBG/insulin  Right neck central line (patient refused to let me examine this morning), site appears non-erythematous, dressing is clean -  CXR prev shows it is short but the tip is no longer twisted -  D/c PICC line   DVT prophylaxis:  heparin Code Status:  Full  Family Communication:  Patient alone Disposition Plan:  Pending amputation  Consultants:   Vascular surgery.   Procedures: None.    Antimicrobials: Anti-infectives    Start     Dose/Rate Route Frequency Ordered Stop   02/13/17 2000  cefTAZidime (FORTAZ) 2 g in dextrose 5 % 50 mL IVPB     2 g 100 mL/hr over 30 Minutes Intravenous Every M-W-F (2000) 02/12/17 1035 02/15/17 2359   02/13/17 1711  vancomycin (VANCOCIN) 1-5 GM/200ML-% IVPB    Comments:  [name], [name]   : cabinet override      02/13/17 1711 02/13/17 1753   02/12/17 0930  cefUROXime (ZINACEF) 1.5 g in  dextrose 5 % 50 mL IVPB     1.5 g 100 mL/hr over 30 Minutes Intravenous To Surgery 02/11/17 1706 02/13/17 0930   02/12/17 0926  cefUROXime (ZINACEF) 1.5 g in dextrose 5 % 50 mL IVPB     1.5 g 100 mL/hr over 30 Minutes Intravenous 30 min pre-op 02/11/17 1757 02/14/17 1335   02/11/17 1200  vancomycin (VANCOCIN) IVPB 1000 mg/200 mL premix     1,000 mg 200 mL/hr over 60 Minutes Intravenous Every M-W-F (Hemodialysis) 02/10/17 1355 02/15/17 1159   02/10/17 1400  vancomycin (VANCOCIN) 2,000 mg in sodium chloride 0.9 % 500 mL IVPB     2,000 mg 250 mL/hr over 120 Minutes Intravenous  Once 02/10/17 1355 02/10/17 1900   02/10/17 1400  cefTAZidime (FORTAZ) 1 g in dextrose 5 % 50 mL IVPB     1 g 100 mL/hr over 30 Minutes Intravenous Daily 02/10/17 1356 02/12/17 1735   02/10/17 0930  cefTAZidime (FORTAZ) 1 g in dextrose 5 % 50 mL IVPB  Status:  Discontinued     1 g 100 mL/hr over 30 Minutes Intravenous Every 24 hours 02/10/17 0845 02/10/17 1356   02/10/17 0930  vancomycin (VANCOCIN) 2,000 mg in sodium chloride 0.9 % 500 mL IVPB  Status:  Discontinued     2,000 mg 250 mL/hr over 120 Minutes Intravenous  Once 02/10/17 0845 02/10/17 1355      Subjective:  I am fine.   Objective:  Vitals:   02/15/17 1145 02/15/17 1200 02/15/17 1215 02/15/17 1251  BP: (!) 116/47 (!) 113/52 (!) 111/52 (!) 110/39  Pulse: 75 78 78 82  Resp:   16 16  Temp:   98.1 F (36.7 C) 98 F (36.7 C)  TempSrc:   Oral Oral  SpO2:   100% 100%  Weight:   87.8 kg (193 lb 9 oz)   Height:        Intake/Output Summary (Last 24 hours) at 02/15/17 1510 Last data filed at 02/15/17 1400  Gross per 24 hour  Intake              340 ml  Output             1500 ml  Net            -1160 ml   Filed Weights   02/14/17 2152 02/15/17 0740 02/15/17 1215  Weight: 92.2 kg (203 lb 4.2 oz) 90.6 kg (199 lb 11.8 oz) 87.8 kg (193 lb 9 oz)    Examination:  General exam: Appears calm and comfortable  Respiratory system: Clear to  auscultation. Respiratory effort normal. Cardiovascular system: S1 & S2 heard, RRR. No JVD, murmurs, rubs, gallops or clicks. No pedal edema. Gastrointestinal system: Abdomen is nondistended, soft and nontender. No organomegaly or masses felt. Normal bowel sounds heard. Central nervous system: Alert and oriented. No focal neurological deficits. Extremities:   R aka Skin: No rashes, lesions or ulcers Psychiatry: Judgement and insight appear normal. Mood & affect appropriate.   Data Reviewed: I have personally reviewed following labs and imaging studies  CBC:  Recent Labs Lab 02/11/17 0816 02/12/17 0003 02/13/17 0710 02/14/17 2115 02/15/17 0213  WBC 10.2 10.5 8.6 11.1* 9.8  HGB 9.7* 9.6* 9.5* 9.0* 8.4*  HCT 30.1* 30.0* 28.5* 27.7* 26.3*  MCV 93.8 94.9 94.1 94.5 94.6  PLT 267 270 253 263 250   Basic Metabolic Panel:  Recent Labs Lab 02/11/17 0816 02/12/17 0003 02/13/17 0710 02/13/17 1851 02/14/17 0831 02/14/17 2115 02/15/17 0213  NA 138 135 135  --  136 136 135  K 4.4 3.7 4.0 4.5 4.3 4.4 4.4  CL 94* 96* 97*  --  96* 96* 95*  CO2 --  GLUCOSE 75 189* 86  --  89 134* 165*  BUN 42* 21* 29*  --  15 19 23*  CREATININE 11.49* 7.49* 10.15*  --  6.24* 7.46* 7.82*  CALCIUM 8.4* 8.6* 8.8*  --  9.3 9.1 9.0  PHOS 5.4*  --  4.9*  --   --  4.3  --    GFR: Estimated Creatinine Clearance: 8.5 mL/min (A) (by C-G formula based on SCr of 7.82 mg/dL (H)). Liver Function Tests:  Recent Labs Lab 02/10/17 0403 02/11/17 0816 02/13/17 0710 02/14/17 2115  AST 26  --   --   --   ALT 27  --   --   --   ALKPHOS 114  --   --   --   BILITOT 0.8  --   --   --   PROT 6.6  --   --   --   ALBUMIN 2.7* 2.4* 2.5* 2.8*   CBG:  Recent Labs Lab 02/14/17 1144 02/14/17 1525 02/14/17 1603 02/14/17 2151 02/15/17 1259  GLUCAP 105* 92 90 110* 131*     Recent Results (from the past 240 hour(s))  MRSA PCR Screening     Status: Abnormal   Collection  Time: 02/10/17  3:33  AM  Result Value Ref Range Status   MRSA by PCR POSITIVE (A) NEGATIVE Final    Comment:        The GeneXpert MRSA Assay (FDA approved for NASAL specimens only), is one component of a comprehensive MRSA colonization surveillance program. It is not intended to diagnose MRSA infection nor to guide or monitor treatment for MRSA infections. RESULT CALLED TO, READ BACK BY AND VERIFIED WITH: IRBY,T RN [phone] AT [phone] [facility]   Surgical pcr screen     Status: None   Collection Time: 02/12/17  5:09 AM  Result Value Ref Range Status   MRSA, PCR NEGATIVE NEGATIVE Final   Staphylococcus aureus NEGATIVE NEGATIVE Final    Comment: (NOTE) The Xpert SA Assay (FDA approved for NASAL specimens in patients 31 years of age and older), is one component of a comprehensive surveillance program. It is not intended to diagnose infection nor to guide or monitor treatment.      Radiology Studies: No results found.  Scheduled Meds: . allopurinol  100 mg Oral QPM  . aspirin EC  81 mg Oral QPM  . calcitRIOL  0.25 mcg Oral Q M,W,F-HD  . cyanocobalamin  250 mcg Oral QPM  . [START ON 02/23/2017] darbepoetin (ARANESP) injection - DIALYSIS  60 mcg Intravenous Q Sat-HD  . feeding supplement (NEPRO CARB STEADY)  237 mL Oral BID BM  . heparin  5,000 Units Subcutaneous Q8H  . multivitamin  1 tablet Oral QHS  . polyethylene glycol  17 g Oral Once  . sucroferric oxyhydroxide  1,000 mg Oral TID WC   Continuous Infusions: . cefTAZidime (FORTAZ)  IV Stopped (02/13/17 2157)     LOS: 5 days   Haydee Salter, MD [facility]   If 7PM-7AM, please contact night-coverage [email or web address] Password [email or web address] 02/15/2017, 3:10 PM

## 2017-02-15 NOTE — Progress Notes (Signed)
  Progress Note    02/15/2017 9:24 AM 1 Day Post-Op  Subjective:  No acute issues  Vitals:   02/15/17 0830 02/15/17 0900  BP: (!) 130/59 (!) 130/55  Pulse: 75 75  Resp:    Temp:    SpO2:      Physical Exam: On hd, sleeping Right aka site dressing cdi  CBC    Component Value Date/Time   WBC 9.8 02/15/2017 0213   RBC 2.78 (L) 02/15/2017 0213   HGB 8.4 (L) 02/15/2017 0213   HCT 26.3 (L) 02/15/2017 0213   PLT 250 02/15/2017 0213   MCV 94.6 02/15/2017 0213   MCH 30.2 02/15/2017 0213   MCHC 31.9 02/15/2017 0213   RDW 15.4 02/15/2017 0213   LYMPHSABS 1.3 01/25/2017 1355   MONOABS 0.4 01/25/2017 1355   EOSABS 0.3 01/25/2017 1355   BASOSABS 0.0 01/25/2017 1355    BMET    Component Value Date/Time   NA 135 02/15/2017 0213   K 4.4 02/15/2017 0213   CL 95 (L) 02/15/2017 0213   CO2 30 02/15/2017 0213   GLUCOSE 165 (H) 02/15/2017 0213   BUN 23 (H) 02/15/2017 0213   CREATININE 7.82 (H) 02/15/2017 0213   CALCIUM 9.0 02/15/2017 0213   CALCIUM 8.4 11/29/2007 1515   GFRNONAA 6 (L) 02/15/2017 0213   GFRAA 7 (L) 02/15/2017 0213    INR    Component Value Date/Time   INR 1.10 01/01/2017 1429     Intake/Output Summary (Last 24 hours) at 02/15/17 0924 Last data filed at 02/15/17 0600  Gross per 24 hour  Intake             1020 ml  Output              200 ml  Net              820 ml     Assessment:  81 y.o. male is s/p failed endovascular revascularization of right lower extremity and pod#1 R AKA.   Plan: Dressing down tomorrow.  Would benefit from goals of care discussion when family available given dusky appearance of left toes and overall general decline. Would not offer revascularization of left leg given deconditioned state and he might ultimately require left leg amputation. This message was conveyed to his wife yesterday via telephone.    Inessa Wardrop C. Randie Heinz, MD [facility] Office: [phone] Pager:  [phone]  02/15/2017 9:24 AM

## 2017-02-15 NOTE — Progress Notes (Signed)
Patient hasn't had bowl movement since 9/22. MD notified. Orders placed. Will continue to monitor.

## 2017-02-16 DIAGNOSIS — G9341 Metabolic encephalopathy: Secondary | ICD-10-CM

## 2017-02-16 LAB — BASIC METABOLIC PANEL
Anion gap: 9 (ref 5–15)
BUN: 14 mg/dL (ref 6–20)
CO2: 32 mmol/L (ref 22–32)
Calcium: 9.5 mg/dL (ref 8.9–10.3)
Chloride: 94 mmol/L — ABNORMAL LOW (ref 101–111)
Creatinine, Ser: 5.57 mg/dL — ABNORMAL HIGH (ref 0.61–1.24)
GFR, EST AFRICAN AMERICAN: 10 mL/min — AB (ref >60)
GFR, EST NON AFRICAN AMERICAN: 9 mL/min — AB (ref >60)
Glucose, Bld: 111 mg/dL — ABNORMAL HIGH (ref 65–99)
Potassium: 4.4 mmol/L (ref 3.5–5.1)
SODIUM: 135 mmol/L (ref 135–145)

## 2017-02-16 LAB — CBC
HCT: 27.5 % — ABNORMAL LOW (ref 39.0–52.0)
HEMOGLOBIN: 8.6 g/dL — AB (ref 13.0–17.0)
MCH: 29.7 pg (ref 26.0–34.0)
MCHC: 31.3 g/dL (ref 30.0–36.0)
MCV: 94.8 fL (ref 78.0–100.0)
Platelets: 253 10*3/uL (ref 150–400)
RBC: 2.9 MIL/uL — ABNORMAL LOW (ref 4.22–5.81)
RDW: 15.3 % (ref 11.5–15.5)
WBC: 7.7 10*3/uL (ref 4.0–10.5)

## 2017-02-16 LAB — GLUCOSE, CAPILLARY
GLUCOSE-CAPILLARY: 153 mg/dL — AB (ref 65–99)
GLUCOSE-CAPILLARY: 164 mg/dL — AB (ref 65–99)
Glucose-Capillary: 109 mg/dL — ABNORMAL HIGH (ref 65–99)
Glucose-Capillary: 135 mg/dL — ABNORMAL HIGH (ref 65–99)

## 2017-02-16 NOTE — Evaluation (Signed)
Physical Therapy Re-Evaluation Patient Details Name: [name] MRN: [id number] DOB: [birth year] Today's Date: 02/16/2017   History of Present Illness  The patient is a 81 yo male with history of ESRD dialysis TTS, HTN, MI, PAD, DM2.  Patient was recently admitted and discharged last month with cellulitis of the RLE foot.  Due to gangrene he had follow up cath with Dr. Randie Heinz on 9/19.  They were unable to re-vascularize the R foot.  Pt is s/p R AKA on 9/27.   Clinical Impression  Pt re-evaluated following R AKA on 9/27. Upon eval, pt very disoriented and easily distracted. Attempted to stand, however, pt easily distracted and wanting to stand independently without assist of PT or OT, therefore deemed unsafe. Required mod to max A +2 for bed mobility this session. Pt seemed to be unaware that he had R AKA, and required education about phantom limb pain. Continue to recommend SNF at d/c to increase independence and safety with functional mobility. Will continue to follow acutely to maximize functional mobility independence and safety.     Follow Up Recommendations SNF    Equipment Recommendations  None recommended by PT    Recommendations for Other Services       Precautions / Restrictions Precautions Precautions: Fall Restrictions Weight Bearing Restrictions: Yes RLE Weight Bearing: Non weight bearing Other Position/Activity Restrictions: R AKA       Mobility  Bed Mobility Overal bed mobility: Needs Assistance Bed Mobility: Supine to Sit;Sit to Supine     Supine to sit: +2 for physical assistance;Mod assist;Max assist Sit to supine: +2 for physical assistance;Min assist   General bed mobility comments: Mod to max A for trunk control during bed mobility. Attempted to assist with scooting towards EOB using bed pad, however, pt requesting to stop secondary to pain. Min to assist with return to supine for assist with trunk guidance and with LLE lift assist.   Transfers                 General transfer comment: Unable to attempt. Wanted to attempt to stand, however, pt easily distracted and requesting PT and OT not help, therefore unsafe to attempt at this time.   Ambulation/Gait                Stairs            Wheelchair Mobility    Modified Rankin (Stroke Patients Only)       Balance Overall balance assessment: Needs assistance Sitting-balance support: Single extremity supported;Bilateral upper extremity supported Sitting balance-Leahy Scale: Fair Sitting balance - Comments: Periods of supervision, however, balance fluctuated as pt distracted with other tasks.                                      Pertinent Vitals/Pain Pain Assessment: Faces Faces Pain Scale: Hurts whole lot Pain Location: R residual limb  Pain Descriptors / Indicators: Grimacing;Crying    Home Living Family/patient expects to be discharged to:: Private residence Living Arrangements: Spouse/significant other Available Help at Discharge: Family;Available 24 hours/day Type of Home: House Home Access: Stairs to enter Entrance Stairs-Rails: Doctor, general practice of Steps: 8 Home Layout: One level Home Equipment: Cane - single point;Walker - 2 wheels;Bedside commode;Wheelchair - manual      Prior Function Level of Independence: Needs assistance   Gait / Transfers Assistance Needed: Per patient, he uses cane when needed.  Per  chart, patient uses RW.  ADL's / Homemaking Assistance Needed: Patient initially reports he is independent with ADL's and drives.  But then states his wife helps him "with everything".        Hand Dominance   Dominant Hand: Right    Extremity/Trunk Assessment   Upper Extremity Assessment Upper Extremity Assessment: Defer to OT evaluation    Lower Extremity Assessment Lower Extremity Assessment: RLE deficits/detail;LLE deficits/detail RLE Deficits / Details: R AKA. Required assist to move into hip flexion  against gravity.  LLE Deficits / Details: Evidence of necrosis L foot as well; quad strength at least 3/5; ankle and hip ROM WFL for simple mobility tasks Reported decreased sensation in the foot.     Cervical / Trunk Assessment Cervical / Trunk Assessment: Kyphotic  Communication   Communication: HOH  Cognition Arousal/Alertness: Awake/alert Behavior During Therapy: Restless Overall Cognitive Status: Impaired/Different from baseline Area of Impairment: Orientation;Attention;Memory;Following commands;Safety/judgement;Problem solving;Awareness                 Orientation Level: Disoriented to;Person;Place;Time;Situation Current Attention Level: Sustained Memory: Decreased short-term memory;Decreased recall of precautions Following Commands: Follows one step commands inconsistently;Follows one step commands with increased time Safety/Judgement: Decreased awareness of safety;Decreased awareness of deficits Awareness: Intellectual Problem Solving: Slow processing;Decreased initiation;Difficulty sequencing;Requires verbal cues General Comments: Pt VERY disoriented this session. Suspect decreased awareness that he has had R AKA as he was stating "let me get this leg out from under me". Also reports he was walking around yesterday with his cane. Reporting sand in room on the floor.       General Comments General comments (skin integrity, edema, etc.): RN requesting to defer OOB mobility this session.     Exercises General Exercises - Lower Extremity Ankle Circles/Pumps: AROM;5 reps;Left;Seated Long Arc Quad: AROM;Left;10 reps;Seated Hip Flexion/Marching: AROM;Left;10 reps;Seated   Assessment/Plan    PT Assessment Patient needs continued PT services  PT Problem List Decreased strength;Decreased range of motion;Decreased activity tolerance;Decreased balance;Decreased mobility;Decreased coordination;Decreased cognition;Decreased knowledge of use of DME;Decreased safety  awareness;Decreased knowledge of precautions;Pain;Impaired sensation       PT Treatment Interventions DME instruction;Functional mobility training;Therapeutic activities;Therapeutic exercise;Balance training;Neuromuscular re-education;Cognitive remediation;Patient/family education;Wheelchair mobility training    PT Goals (Current goals can be found in the Care Plan section)  Acute Rehab PT Goals Patient Stated Goal: Did not state PT Goal Formulation: Patient unable to participate in goal setting Time For Goal Achievement: 03/02/17 Potential to Achieve Goals: Fair    Frequency Min 2X/week   Barriers to discharge        Co-evaluation PT/OT/SLP Co-Evaluation/Treatment: Yes Reason for Co-Treatment: Complexity of the patient's impairments (multi-system involvement);Necessary to address cognition/behavior during functional activity;For patient/therapist safety;To address functional/ADL transfers PT goals addressed during session: Mobility/safety with mobility;Balance         AM-PAC PT "6 Clicks" Daily Activity  Outcome Measure Difficulty turning over in bed (including adjusting bedclothes, sheets and blankets)?: Unable Difficulty moving from lying on back to sitting on the side of the bed? : Unable Difficulty sitting down on and standing up from a chair with arms (e.g., wheelchair, bedside commode, etc,.)?: Unable Help needed moving to and from a bed to chair (including a wheelchair)?: Total Help needed walking in hospital room?: Total Help needed climbing 3-5 steps with a railing? : Total 6 Click Score: 6    End of Session   Activity Tolerance: Patient limited by pain Patient left: in bed;with bed alarm set;with call bell/phone within reach Nurse Communication: Mobility status PT Visit  Diagnosis: Muscle weakness (generalized) (M62.81);Pain;Unsteadiness on feet (R26.81);Other abnormalities of gait and mobility (R26.89) Pain - Right/Left: Right Pain - part of body:  (residual  limb RLE)    Time: 6433-2951 PT Time Calculation (min) (ACUTE ONLY): 19 min   Charges:   PT Evaluation $PT Re-evaluation: 1 Re-eval     PT G Codes:        Gladys Damme, PT, DPT  Acute Rehabilitation Services  Pager: [phone]   Lehman Prom 02/16/2017, 11:14 AM

## 2017-02-16 NOTE — Progress Notes (Signed)
[facility] Progress Note   Dialysis Orders: [facility] MWF4.25 hr 450/800 2 K 2 Ca EDW 100.5 - profile 2 left lower AVGG heparin 7500 Mircera 50 q 2 wks - last 9/22, calcitriol 0.25 Parsabiv 5 Recent labs: hgb 10.8 29% sat iPTH 1780 Ca 9.8 P 6.8  Assessment/Plan: 1. Bilateral infected and ischemic toes R >L;s/p right AKA 9/27 on empiric Vanc and Fortaz;  2. ESRD- continue with MWF schedule. Next HD Monday  3. Hypertension/volume- BP better net UF 1.5L Friday post weight 87.8kg   - bed scale weights signifcantly below edw - ? accurate 4. Anemia- hgb drifting down post-op, transfuse prn. Next ESA due 10/6  5. Metabolic bone disease- Continue calcitriol and binders; Parsabiv not avail in hospital Ca/P ok 6. Nutrition- renal diet/multivitamin/Nepro alb 2.5 - intake very poor 7. Disp - likely will need SNF - need to address goals of care   Tomasa Blase PA-C [facility] Pager [phone] 02/16/2017,10:28 AM   Subjective:   Seen in room, in good spirits. Making jokes. Pain controlled    Objective Vitals:   02/15/17 1645 02/15/17 2127 02/16/17 0537 02/16/17 1005  BP: (!) 106/59  (!) 133/44 (!) 118/45  Pulse: 99 63 82 90  Resp: Temp: 98 F (36.7 C) 98.2 F (36.8 C) 98.3 F (36.8 C) 98.2 F (36.8 C)  TempSrc: Oral Oral Oral Oral  SpO2: 93% 100% 95% 96%  Weight:      Height:       Physical Exam General: sleeping, rouses easily. Heart: RRR Lungs: no rales Abdomen: soft obese NT Extremities: no LE edema - s/p R AKA bandaged  Dialysis Access: left lower AVGG + bruit   Additional Objective Labs: Basic Metabolic Panel:  Recent Labs Lab 02/11/17 0816  02/13/17 0710  02/14/17 2115 02/15/17 0213 02/16/17 0339  NA 138  < > 135  < > 136 135 135  K 4.4  < > 4.0  < > 4.4 4.4 4.4  CL 94*  < > 97*  < > 96* 95* 94*  CO2 29  < > 28  < > 31 30 32  GLUCOSE 75  < > 86  < > 134* 165* 111*  BUN 42*  < > 29*  < > 19 23* 14   CREATININE 11.49*  < > 10.15*  < > 7.46* 7.82* 5.57*  CALCIUM 8.4*  < > 8.8*  < > 9.1 9.0 9.5  PHOS 5.4*  --  4.9*  --  4.3  --   --   < > = values in this interval not displayed. Liver Function Tests:  Recent Labs Lab 02/10/17 0403 02/11/17 0816 02/13/17 0710 02/14/17 2115  AST 26  --   --   --   ALT 27  --   --   --   ALKPHOS 114  --   --   --   BILITOT 0.8  --   --   --   PROT 6.6  --   --   --   ALBUMIN 2.7* 2.4* 2.5* 2.8*   CBC:  Recent Labs Lab 02/12/17 0003 02/13/17 0710 02/14/17 2115 02/15/17 0213 02/16/17 0339  WBC 10.5 8.6 11.1* 9.8 7.7  HGB 9.6* 9.5* 9.0* 8.4* 8.6*  HCT 30.0* 28.5* 27.7* 26.3* 27.5*  MCV 94.9 94.1 94.5 94.6 94.8  PLT 270 253 263 250 253   CBG:  Recent Labs Lab 02/14/17 2151 02/15/17 1259 02/15/17 1642 02/15/17 2115 02/16/17  0758  GLUCAP 110* 131* 118* 87 109*   Medications:  . allopurinol  100 mg Oral QPM  . aspirin EC  81 mg Oral QPM  . calcitRIOL  0.25 mcg Oral Q M,W,F-HD  . cyanocobalamin  250 mcg Oral QPM  . [START ON 02/23/2017] darbepoetin (ARANESP) injection - DIALYSIS  60 mcg Intravenous Q Sat-HD  . feeding supplement (NEPRO CARB STEADY)  237 mL Oral BID BM  . heparin  5,000 Units Subcutaneous Q8H  . multivitamin  1 tablet Oral QHS  . sucroferric oxyhydroxide  1,000 mg Oral TID WC    I have seen and examined this patient and agree with plan and assessment in the above note with renal recommendations/intervention highlighted.  Feels much better after amputation. Jomarie Longs A Kaleeya Hancock,MD 02/16/2017 11:10 AM

## 2017-02-16 NOTE — Progress Notes (Signed)
PROGRESS NOTE    [name]  ZOX:[id number] DOB: 1935-12-02 DOA: 02/10/2017 PCP: Crist Fat, MD    Brief Narrative: 35 transferred from [facility] with AMS and gangrene foot.  Vascular performed AKA.  He also has ESRD and has been on HD MWF, along with anemia.  He was on Van/Fortaz, and mental status improved marginally again postoperatively antibiotics were stopped.    Assessment & Plan:   Principal Problem:   Gangrene of foot (HCC) Active Problems:   End stage renal disease (HCC)   Hypertension   Acute metabolic encephalopathy   Diabetes mellitus with complication (HCC)   Pressure injury of skin  Gangrene of the bilateral feet, right greater than left.   -  Vascular surgery assistance appreciated -  stopped abx  Acute metabolic encephalopathy, improved -  off abx - per family patient likely at baseline - will discharge to SNF  ESRD -  Nephrology consulting and patient receiving intermittent HD  Hypertension, currently on no medications  Diabetes mellitus type 2, on no medications -  Patient refusing CBG/insulin  Right neck central line, site appears non-erythematous, dressing is clean -  CXR prev shows it is short but the tip is no longer twisted -  D/c PICC line   DVT prophylaxis:  heparin Code Status:  Full  Family Communication:  Patient alone Disposition Plan:  Pending amputation  Consultants:   Vascular surgery.   Procedures: None.    Antimicrobials: Anti-infectives    Start     Dose/Rate Route Frequency Ordered Stop   02/13/17 2000  cefTAZidime (FORTAZ) 2 g in dextrose 5 % 50 mL IVPB     2 g 100 mL/hr over 30 Minutes Intravenous Every M-W-F (2000) 02/12/17 1035 02/15/17 2111   02/13/17 1711  vancomycin (VANCOCIN) 1-5 GM/200ML-% IVPB    Comments:  [name], [name]   : cabinet override      02/13/17 1711 02/13/17 1753   02/12/17 0930  cefUROXime (ZINACEF) 1.5 g in dextrose 5 % 50 mL IVPB     1.5 g 100 mL/hr over 30 Minutes  Intravenous To Surgery 02/11/17 1706 02/13/17 0930   02/12/17 0926  cefUROXime (ZINACEF) 1.5 g in dextrose 5 % 50 mL IVPB     1.5 g 100 mL/hr over 30 Minutes Intravenous 30 min pre-op 02/11/17 1757 02/14/17 1335   02/11/17 1200  vancomycin (VANCOCIN) IVPB 1000 mg/200 mL premix     1,000 mg 200 mL/hr over 60 Minutes Intravenous Every M-W-F (Hemodialysis) 02/10/17 1355 02/15/17 1159   02/10/17 1400  vancomycin (VANCOCIN) 2,000 mg in sodium chloride 0.9 % 500 mL IVPB     2,000 mg 250 mL/hr over 120 Minutes Intravenous  Once 02/10/17 1355 02/10/17 1900   02/10/17 1400  cefTAZidime (FORTAZ) 1 g in dextrose 5 % 50 mL IVPB     1 g 100 mL/hr over 30 Minutes Intravenous Daily 02/10/17 1356 02/12/17 1735   02/10/17 0930  cefTAZidime (FORTAZ) 1 g in dextrose 5 % 50 mL IVPB  Status:  Discontinued     1 g 100 mL/hr over 30 Minutes Intravenous Every 24 hours 02/10/17 0845 02/10/17 1356   02/10/17 0930  vancomycin (VANCOCIN) 2,000 mg in sodium chloride 0.9 % 500 mL IVPB  Status:  Discontinued     2,000 mg 250 mL/hr over 120 Minutes Intravenous  Once 02/10/17 0845 02/10/17 1355      Subjective:  No complaints. No issues overnight per nursing.  Objective: Vitals:   02/15/17 1645  02/15/17 2127 02/16/17 0537 02/16/17 1005  BP: (!) 106/59  (!) 133/44 (!) 118/45  Pulse: 99 63 82 90  Resp: Temp: 98 F (36.7 C) 98.2 F (36.8 C) 98.3 F (36.8 C) 98.2 F (36.8 C)  TempSrc: Oral Oral Oral Oral  SpO2: 93% 100% 95% 96%  Weight:      Height:        Intake/Output Summary (Last 24 hours) at 02/16/17 1701 Last data filed at 02/16/17 1005  Gross per 24 hour  Intake              668 ml  Output                0 ml  Net              668 ml   Filed Weights   02/14/17 2152 02/15/17 0740 02/15/17 1215  Weight: 92.2 kg (203 lb 4.2 oz) 90.6 kg (199 lb 11.8 oz) 87.8 kg (193 lb 9 oz)    Examination:  General exam: Appears calm and comfortable; A&Ox3 Respiratory system: Clear to  auscultation. Respiratory effort normal. Cardiovascular system: S1 & S2 heard, RRR. No JVD, murmurs, rubs, gallops or clicks. No pedal edema. Gastrointestinal system: Abdomen is nondistended, soft and nontender. No organomegaly or masses felt. Normal bowel sounds heard. Central nervous system: Alert and oriented. No focal neurological deficits. Extremities:   R aka Skin: No rashes, lesions or ulcers Psychiatry: Judgement and insight appear normal. Mood & affect appropriate.   Data Reviewed: I have personally reviewed following labs and imaging studies  CBC:  Recent Labs Lab 02/12/17 0003 02/13/17 0710 02/14/17 2115 02/15/17 0213 02/16/17 0339  WBC 10.5 8.6 11.1* 9.8 7.7  HGB 9.6* 9.5* 9.0* 8.4* 8.6*  HCT 30.0* 28.5* 27.7* 26.3* 27.5*  MCV 94.9 94.1 94.5 94.6 94.8  PLT 270 253 263 250 253   Basic Metabolic Panel:  Recent Labs Lab 02/11/17 0816  02/13/17 0710 02/13/17 1851 02/14/17 0831 02/14/17 2115 02/15/17 0213 02/16/17 0339  NA 138  < > 135  --  136 136 135 135  K 4.4  < > 4.0 4.5 4.3 4.4 4.4 4.4  CL 94*  < > 97*  --  96* 96* 95* 94*  CO2 29  < > 28  --  32  GLUCOSE 75  < > 86  --  89 134* 165* 111*  BUN 42*  < > 29*  --  15 19 23* 14  CREATININE 11.49*  < > 10.15*  --  6.24* 7.46* 7.82* 5.57*  CALCIUM 8.4*  < > 8.8*  --  9.3 9.1 9.0 9.5  PHOS 5.4*  --  4.9*  --   --  4.3  --   --   < > = values in this interval not displayed. GFR: Estimated Creatinine Clearance: 12 mL/min (A) (by C-G formula based on SCr of 5.57 mg/dL (H)). Liver Function Tests:  Recent Labs Lab 02/10/17 0403 02/11/17 0816 02/13/17 0710 02/14/17 2115  AST 26  --   --   --   ALT 27  --   --   --   ALKPHOS 114  --   --   --   BILITOT 0.8  --   --   --   PROT 6.6  --   --   --   ALBUMIN 2.7* 2.4* 2.5* 2.8*   CBG:  Recent Labs Lab 02/15/17 1259 02/15/17 1642 02/15/17  2115 02/16/17 0758 02/16/17 1211  GLUCAP 131* 118* 87 109* 153*     Recent Results (from the past 240  hour(s))  MRSA PCR Screening     Status: Abnormal   Collection Time: 02/10/17  3:33 AM  Result Value Ref Range Status   MRSA by PCR POSITIVE (A) NEGATIVE Final    Comment:        The GeneXpert MRSA Assay (FDA approved for NASAL specimens only), is one component of a comprehensive MRSA colonization surveillance program. It is not intended to diagnose MRSA infection nor to guide or monitor treatment for MRSA infections. RESULT CALLED TO, READ BACK BY AND VERIFIED WITH: IRBY,T RN [phone] AT [phone] [facility]   Surgical pcr screen     Status: None   Collection Time: 02/12/17  5:09 AM  Result Value Ref Range Status   MRSA, PCR NEGATIVE NEGATIVE Final   Staphylococcus aureus NEGATIVE NEGATIVE Final    Comment: (NOTE) The Xpert SA Assay (FDA approved for NASAL specimens in patients 66 years of age and older), is one component of a comprehensive surveillance program. It is not intended to diagnose infection nor to guide or monitor treatment.      Radiology Studies: No results found.  Scheduled Meds: . allopurinol  100 mg Oral QPM  . aspirin EC  81 mg Oral QPM  . calcitRIOL  0.25 mcg Oral Q M,W,F-HD  . cyanocobalamin  250 mcg Oral QPM  . [START ON 02/23/2017] darbepoetin (ARANESP) injection - DIALYSIS  60 mcg Intravenous Q Sat-HD  . feeding supplement (NEPRO CARB STEADY)  237 mL Oral BID BM  . heparin  5,000 Units Subcutaneous Q8H  . multivitamin  1 tablet Oral QHS  . sucroferric oxyhydroxide  1,000 mg Oral TID WC   Continuous Infusions:    LOS: 6 days   Haydee Salter, MD [facility].   If 7PM-7AM, please contact night-coverage [email or web address] Password [email or web address] 02/16/2017, 5:01 PM

## 2017-02-16 NOTE — Progress Notes (Signed)
[facility] paged patient is attempting to get OOB and not able to understand that he can not at this time. Multiple attempts made to reorient patient. New order for Tele-monitoring. Explained to patient this is to help to remind him to remain in the bed for his safety. Ilean Skill LPN

## 2017-02-16 NOTE — Evaluation (Signed)
Occupational Therapy Evaluation Patient Details Name: [name] MRN: [id number] DOB: 10-06-1935 Today's Date: 02/16/2017    History of Present Illness The patient is a 81 yo male with history of ESRD dialysis TTS, HTN, MI, PAD, DM2.  Patient was recently admitted and discharged last month with cellulitis of the RLE foot.  Due to gangrene he had follow up cath with Dr. Randie Heinz on 9/19.  They were unable to re-vascularize the R foot.  Pt is s/p R AKA on 9/27.    Clinical Impression   PTA, pt was at [facility] for rehabilitation. Pt with inconsistent reports of assistance levels required for ADL and functional mobility. He currently requires max assist for LB ADL, min assist for UB ADL, and supervision for seated self-feeding and grooming tasks. Pt is highly confused, disoriented x4, demonstrates poor attention to task, and is hallucinating that there is sand pouring from the walls. He additionally demonstrates decreased awareness of his recent AKA and frequently states "I just need to get this leg out from under me." He would benefit from continued OT services while admitted in preparation for D/C to SNF for continued rehabilitation.    Follow Up Recommendations  SNF;Supervision/Assistance - 24 hour    Equipment Recommendations  None recommended by OT    Recommendations for Other Services       Precautions / Restrictions Precautions Precautions: Fall Restrictions Weight Bearing Restrictions: Yes RLE Weight Bearing: Non weight bearing Other Position/Activity Restrictions: R AKA       Mobility Bed Mobility Overal bed mobility: Needs Assistance Bed Mobility: Supine to Sit;Sit to Supine     Supine to sit: +2 for physical assistance;Mod assist;Max assist Sit to supine: +2 for physical assistance;Min assist   General bed mobility comments: Mod to max A for trunk control during bed mobility. Attempted to assist with scooting towards EOB using bed pad, however, pt requesting to stop secondary  to pain. Min to assist with return to supine for assist with trunk guidance and with LLE lift assist.   Transfers                 General transfer comment: Pt requesting OT and PT not help him with transfer and would attempt to initiate but then become distracted. He is not safe to attempt to stand on his own and unable to progress with mobility this session.     Balance Overall balance assessment: Needs assistance Sitting-balance support: Single extremity supported;Bilateral upper extremity supported Sitting balance-Leahy Scale: Fair Sitting balance - Comments: Periods of supervision, however, balance fluctuated as pt distracted with other tasks.                                    ADL either performed or assessed with clinical judgement   ADL Overall ADL's : Needs assistance/impaired Eating/Feeding: Supervision/ safety;Sitting   Grooming: Supervision/safety;Set up;Sitting   Upper Body Bathing: Minimal assistance;Sitting   Lower Body Bathing: Maximal assistance;Sitting/lateral leans   Upper Body Dressing : Minimal assistance;Sitting   Lower Body Dressing: Maximal assistance;Sitting/lateral leans                 General ADL Comments: Unsafe to progress with mobility due to pt confusion. Attempted sit<>stand multiple times and pt highly confused throughout. Will attempt to initiate task but then becoming distracted by looking for remote.     Vision   Additional Comments: Unable to fully assess.  Perception     Praxis      Pertinent Vitals/Pain Pain Assessment: Faces Faces Pain Scale: Hurts whole lot Pain Location: R residual limb  Pain Descriptors / Indicators: Grimacing;Crying Pain Intervention(s): Monitored during session;Limited activity within patient's tolerance     Hand Dominance Right   Extremity/Trunk Assessment Upper Extremity Assessment Upper Extremity Assessment: Generalized weakness   Lower Extremity Assessment Lower  Extremity Assessment: Defer to PT evaluation RLE Deficits / Details: R AKA. Required assist to move into hip flexion against gravity.  LLE Deficits / Details: Evidence of necrosis L foot as well; quad strength at least 3/5; ankle and hip ROM WFL for simple mobility tasks Reported decreased sensation in the foot.    Cervical / Trunk Assessment Cervical / Trunk Assessment: Kyphotic   Communication Communication Communication: HOH   Cognition Arousal/Alertness: Awake/alert Behavior During Therapy: Restless Overall Cognitive Status: Impaired/Different from baseline Area of Impairment: Orientation;Attention;Memory;Following commands;Safety/judgement;Problem solving;Awareness                 Orientation Level: Disoriented to;Person;Place;Time;Situation Current Attention Level: Sustained Memory: Decreased short-term memory;Decreased recall of precautions Following Commands: Follows one step commands inconsistently;Follows one step commands with increased time Safety/Judgement: Decreased awareness of safety;Decreased awareness of deficits Awareness: Intellectual Problem Solving: Slow processing;Decreased initiation;Difficulty sequencing;Requires verbal cues General Comments: Pt VERY disoriented this session. Suspect decreased awareness that he has had R AKA as he was stating "let me get this leg out from under me". Also reports he was walking around yesterday with his cane. Reporting sand in room on the floor and concerned for hallucination. Highly distracted by external stimuli.    General Comments  RN requesting to defer OOB mobility this session.     Exercises Exercises: General Lower Extremity General Exercises - Lower Extremity Ankle Circles/Pumps: AROM;5 reps;Left;Seated Long Arc Quad: AROM;Left;10 reps;Seated Hip Flexion/Marching: AROM;Left;10 reps;Seated   Shoulder Instructions      Home Living Family/patient expects to be discharged to:: Private residence Living  Arrangements: Spouse/significant other Available Help at Discharge: Family;Available 24 hours/day Type of Home: House Home Access: Stairs to enter [employer] of Steps: 8 Entrance Stairs-Rails: Right;Left Home Layout: One level         Firefighter: Standard     Home Equipment: Cane - single point;Walker - 2 wheels;Bedside commode;Wheelchair - manual          Prior Functioning/Environment Level of Independence: Needs assistance  Gait / Transfers Assistance Needed: Per patient, he uses cane when needed.  Per chart, patient uses RW. ADL's / Homemaking Assistance Needed: Pt confused and unable to report. Per PT initial eval pt with conflicting answers reporting once that he is independent and once that his wife assists him with "everything." Communication / Swallowing Assistance Needed: HOH Comments: Information from chart primarily as well as PT evaluation due to pt unable to answer PLOF questions.         OT Problem List: Decreased strength;Decreased activity tolerance;Impaired balance (sitting and/or standing);Decreased safety awareness;Decreased knowledge of use of DME or AE;Decreased knowledge of precautions;Pain      OT Treatment/Interventions: Self-care/ADL training;Therapeutic exercise;Energy conservation;DME and/or AE instruction;Therapeutic activities;Patient/family education;Balance training    OT Goals(Current goals can be found in the care plan section) Acute Rehab OT Goals Patient Stated Goal: Did not state OT Goal Formulation: With patient Time For Goal Achievement: 03/02/17 Potential to Achieve Goals: Fair ADL Goals Pt Will Perform Grooming: with modified independence;sitting Pt Will Transfer to Toilet: with mod assist;stand pivot transfer;bedside commode Pt Will Perform  Toileting - Clothing Manipulation and hygiene: with mod assist;sit to/from stand;sitting/lateral leans Additional ADL Goal #1: Pt will follow 3/4 one-step commands during seated  grooming tasks with no more than 1 VC. Additional ADL Goal #2: Pt will demonstrate selective attention to UB dressing tasks seated at EOB in a minimally distracting environment.   OT Frequency: Min 1X/week   Barriers to D/C:            Co-evaluation PT/OT/SLP Co-Evaluation/Treatment: Yes Reason for Co-Treatment: Complexity of the patient's impairments (multi-system involvement);Necessary to address cognition/behavior during functional activity;For patient/therapist safety;To address functional/ADL transfers PT goals addressed during session: Mobility/safety with mobility;Balance OT goals addressed during session: ADL's and self-care;Strengthening/ROM      AM-PAC PT "6 Clicks" Daily Activity     Outcome Measure Help from another person eating meals?: A Little Help from another person taking care of personal grooming?: A Little Help from another person toileting, which includes using toliet, bedpan, or urinal?: A Lot Help from another person bathing (including washing, rinsing, drying)?: A Lot Help from another person to put on and taking off regular upper body clothing?: A Lot Help from another person to put on and taking off regular lower body clothing?: A Lot 6 Click Score: 14   End of Session Nurse Communication: Mobility status  Activity Tolerance: Patient tolerated treatment well Patient left: in bed;with call bell/phone within reach;with bed alarm set  OT Visit Diagnosis: Pain;Other symptoms and signs involving cognitive function;Muscle weakness (generalized) (M62.81) Pain - Right/Left: Right Pain - part of body:  (residual limb of R LE)                Time: 1610-9604 OT Time Calculation (min): 19 min Charges:  OT General Charges $OT Visit: 1 Visit OT Evaluation $OT Eval Moderate Complexity: 1 Mod G-Codes:     Doristine Section, MS OTR/L  Pager: [phone]   Raif Chachere A Clarkson Rosselli 02/16/2017, 11:41 AM

## 2017-02-16 NOTE — Progress Notes (Addendum)
[facility]  Subjective  - Doing well over all.   Objective (!) 118/45 90 98.2 F (36.8 C) (Oral) 17 96%  Intake/Output Summary (Last 24 hours) at 02/16/17 1120 Last data filed at 02/16/17 1005  Gross per 24 hour  Intake             1128 ml  Output             1500 ml  Net             -372 ml    Right AKA stump viable Clean dry dressing applied.  Assessment/Planning: POD # 2 right AKA  Viable stump  COLLINS, EMMA MAUREEN 02/16/2017 11:20 AM  I have interviewed the patient and examined the patient. I agree with the findings by the PA. I inspected the AKA site and the wound looks fine.  Redressed.   Cari Caraway, MD [phone]    Laboratory Lab Results:  Recent Labs  02/15/17 0213 02/16/17 0339  WBC 9.8 7.7  HGB 8.4* 8.6*  HCT 26.3* 27.5*  PLT 250 253   BMET  Recent Labs  02/15/17 0213 02/16/17 0339  NA 135 135  K 4.4 4.4  CL 95* 94*  CO2 30 32  GLUCOSE 165* 111*  BUN 23* 14  CREATININE 7.82* 5.57*  CALCIUM 9.0 9.5    COAG Lab Results  Component Value Date   INR 1.10 01/01/2017   INR 1.3 12/04/2008   INR 1.0 08/21/2007   No results found for: PTT

## 2017-02-17 DIAGNOSIS — I120 Hypertensive chronic kidney disease with stage 5 chronic kidney disease or end stage renal disease: Secondary | ICD-10-CM | POA: Diagnosis not present

## 2017-02-17 DIAGNOSIS — Z992 Dependence on renal dialysis: Secondary | ICD-10-CM | POA: Diagnosis not present

## 2017-02-17 DIAGNOSIS — N186 End stage renal disease: Secondary | ICD-10-CM | POA: Diagnosis not present

## 2017-02-17 LAB — CBC WITH DIFFERENTIAL/PLATELET
BASOS ABS: 0 10*3/uL (ref 0.0–0.1)
BASOS PCT: 0 %
EOS ABS: 0.2 10*3/uL (ref 0.0–0.7)
Eosinophils Relative: 3 %
HEMATOCRIT: 25.8 % — AB (ref 39.0–52.0)
Hemoglobin: 8.2 g/dL — ABNORMAL LOW (ref 13.0–17.0)
Lymphocytes Relative: 15 %
Lymphs Abs: 1.2 10*3/uL (ref 0.7–4.0)
MCH: 29.7 pg (ref 26.0–34.0)
MCHC: 31.8 g/dL (ref 30.0–36.0)
MCV: 93.5 fL (ref 78.0–100.0)
Monocytes Absolute: 0.6 10*3/uL (ref 0.1–1.0)
Monocytes Relative: 7 %
NEUTROS ABS: 5.8 10*3/uL (ref 1.7–7.7)
NEUTROS PCT: 75 %
Platelets: 264 10*3/uL (ref 150–400)
RBC: 2.76 MIL/uL — AB (ref 4.22–5.81)
RDW: 15.3 % (ref 11.5–15.5)
WBC: 7.8 10*3/uL (ref 4.0–10.5)

## 2017-02-17 LAB — GLUCOSE, CAPILLARY
GLUCOSE-CAPILLARY: 93 mg/dL (ref 65–99)
GLUCOSE-CAPILLARY: 139 mg/dL — AB (ref 65–99)
GLUCOSE-CAPILLARY: 127 mg/dL — AB (ref 65–99)
Glucose-Capillary: 146 mg/dL — ABNORMAL HIGH (ref 65–99)

## 2017-02-17 LAB — RENAL FUNCTION PANEL
ANION GAP: 8 (ref 5–15)
Albumin: 2.3 g/dL — ABNORMAL LOW (ref 3.5–5.0)
BUN: 26 mg/dL — ABNORMAL HIGH (ref 6–20)
CALCIUM: 9.4 mg/dL (ref 8.9–10.3)
CO2: 32 mmol/L (ref 22–32)
CREATININE: 7.47 mg/dL — AB (ref 0.61–1.24)
Chloride: 93 mmol/L — ABNORMAL LOW (ref 101–111)
GFR, EST AFRICAN AMERICAN: 7 mL/min — AB (ref >60)
GFR, EST NON AFRICAN AMERICAN: 6 mL/min — AB (ref >60)
Glucose, Bld: 118 mg/dL — ABNORMAL HIGH (ref 65–99)
Phosphorus: 3.3 mg/dL (ref 2.5–4.6)
Potassium: 4.5 mmol/L (ref 3.5–5.1)
SODIUM: 133 mmol/L — AB (ref 135–145)

## 2017-02-17 LAB — HEMOGLOBIN AND HEMATOCRIT, BLOOD
HEMATOCRIT: 27.6 % — AB (ref 39.0–52.0)
HEMOGLOBIN: 9.1 g/dL — AB (ref 13.0–17.0)

## 2017-02-17 NOTE — Progress Notes (Signed)
[facility] Progress Note   Dialysis Orders: [facility] MWF4.25 hr 450/800 2 K 2 Ca EDW 100.5 - profile 2 left lower AVGG heparin 7500 Mircera 50 q 2 wks - last 9/22, calcitriol 0.25 Parsabiv 5 Recent labs: hgb 10.8 29% sat iPTH 1780 Ca 9.8 P 6.8  Assessment/Plan: 1. Bilateral infected and ischemic toes R >L;s/p right AKA 9/27 on empiric Vanc and Fortaz;  2. ESRD- continue with MWF schedule. Next HD Monday. Hold heparin following surgery 3. Hypertension/volume- BP better net UF 1.5L Friday post weight 87.8kg   - bed scale weights signifcantly below edw - ? accurate 4. Anemia- hgb drifting down post-op, transfuse prn. Next ESA due 10/6  5. Metabolic bone disease- Continue calcitriol and binders; Parsabiv not avail in hospital Ca/P ok 6. Nutrition- renal diet/multivitamin/Nepro alb 2.3 - intake very poor 7. Disp - likely will need SNF - need to address goals of care   Tomasa Blase PA-C [facility] Pager [phone] 02/17/2017,10:10 AM  I have seen and examined this patient and agree with plan and assessment in the above note with renal recommendations/intervention highlighted.  Doing well, continue with HD qMWF while he remains an inpatient.  Jomarie Longs A Deagan Sevin,MD 02/17/2017 11:05 AM   Subjective:   POD #3 Seen in room, lying in bed. Has no c/os today. Pain controlled   Objective Vitals:   02/16/17 1818 02/16/17 2139 02/17/17 0553 02/17/17 0846  BP: (!) 147/55 (!) 137/44 (!) 139/43 (!) 164/57  Pulse: 86 80 (!) 57 78  Resp: Temp: 98.6 F (37 C) 98.8 F (37.1 C) 97.6 F (36.4 C) (!) 97.3 F (36.3 C)  TempSrc: Oral Oral Oral Oral  SpO2: 96% 100% 96% 96%  Weight:      Height:       Physical Exam General: sleeping, rouses easily. Heart: RRR Lungs: no rales Abdomen: soft obese NT Extremities: no LE edema - s/p R AKA bandaged  Dialysis Access: left lower AVGG + bruit   Additional Objective Labs: Basic Metabolic  Panel:  Recent Labs Lab 02/13/17 0710  02/14/17 2115 02/15/17 0213 02/16/17 0339 02/17/17 0209  NA 135  < > 136 135 135 133*  K 4.0  < > 4.4 4.4 4.4 4.5  CL 97*  < > 96* 95* 94* 93*  CO2 28  < > 31 30 32 32  GLUCOSE 86  < > 134* 165* 111* 118*  BUN 29*  < > 19 23* 14 26*  CREATININE 10.15*  < > 7.46* 7.82* 5.57* 7.47*  CALCIUM 8.8*  < > 9.1 9.0 9.5 9.4  PHOS 4.9*  --  4.3  --   --  3.3  < > = values in this interval not displayed. Liver Function Tests:  Recent Labs Lab 02/13/17 0710 02/14/17 2115 02/17/17 0209  ALBUMIN 2.5* 2.8* 2.3*   CBC:  Recent Labs Lab 02/13/17 0710 02/14/17 2115 02/15/17 0213 02/16/17 0339 02/17/17 0209  WBC 8.6 11.1* 9.8 7.7 7.8  NEUTROABS  --   --   --   --  5.8  HGB 9.5* 9.0* 8.4* 8.6* 8.2*  HCT 28.5* 27.7* 26.3* 27.5* 25.8*  MCV 94.1 94.5 94.6 94.8 93.5  PLT 253 263 250 253 264   CBG:  Recent Labs Lab 02/16/17 0758 02/16/17 1211 02/16/17 1823 02/16/17 2134 02/17/17 0744  GLUCAP 109* 153* 164* 135* 93   Medications:  . allopurinol  100 mg Oral QPM  . aspirin EC  81 mg  Oral QPM  . calcitRIOL  0.25 mcg Oral Q M,W,F-HD  . cyanocobalamin  250 mcg Oral QPM  . [START ON 02/23/2017] darbepoetin (ARANESP) injection - DIALYSIS  60 mcg Intravenous Q Sat-HD  . feeding supplement (NEPRO CARB STEADY)  237 mL Oral BID BM  . heparin  5,000 Units Subcutaneous Q8H  . multivitamin  1 tablet Oral QHS  . sucroferric oxyhydroxide  1,000 mg Oral TID WC

## 2017-02-17 NOTE — Progress Notes (Signed)
[facility] [place]  Subjective  -  Comfortable    Objective (!) 164/57 78 (!) 97.3 F (36.3 C) (Oral) 16 96%  Intake/Output Summary (Last 24 hours) at 02/17/17 0852 Last data filed at 02/17/17 0806  Gross per 24 hour  Intake              560 ml  Output                0 ml  Net              560 ml    Right AKA dressing clean and dry Thigh soft and non tender  Assessment/Planning: POD # 3 right AKA Will change dressing tomorrow     Clinton Gallant [facility] 02/17/2017 8:52 AM --  Laboratory Lab Results:  Recent Labs  02/16/17 0339 02/17/17 0209  WBC 7.7 7.8  HGB 8.6* 8.2*  HCT 27.5* 25.8*  PLT 253 264   BMET  Recent Labs  02/16/17 0339 02/17/17 0209  NA 135 133*  K 4.4 4.5  CL 94* 93*  CO2 32 32  GLUCOSE 111* 118*  BUN 14 26*  CREATININE 5.57* 7.47*  CALCIUM 9.5 9.4    COAG Lab Results  Component Value Date   INR 1.10 01/01/2017   INR 1.3 12/04/2008   INR 1.0 08/21/2007   No results found for: PTT

## 2017-02-17 NOTE — Progress Notes (Signed)
PROGRESS NOTE    [name]  ZOX:[id number] DOB: [birth year] DOA: 02/10/2017 PCP: Crist Fat, MD    Brief Narrative: 33 transferred from [facility] with AMS and gangrene foot.  Vascular performed AKA.  He also has ESRD and has been on HD MWF, along with anemia.  He was on Van/Fortaz, and mental status improved marginally again postoperatively antibiotics were stopped. Mentation continued to improve.trending hemoglobin due to small drift down.   Assessment & Plan:   Principal Problem:   Gangrene of foot (HCC) Active Problems:   End stage renal disease (HCC)   Hypertension   Acute metabolic encephalopathy   Diabetes mellitus with complication (HCC)   Pressure injury of skin  Gangrene of the bilateral feet, right greater than left.   -  Vascular surgery assistance appreciated -  stopped abx  Anemia - possible transfusion - trending H/H  Acute metabolic encephalopathy, improved -  off abx - per family patient likely at baseline - will discharge to SNF  ESRD -  Nephrology consulting and patient receiving intermittent HD  Hypertension, currently on no medications  Diabetes mellitus type 2, on no medications -  Patient refusing CBG/insulin  Right neck central line, site appears non-erythematous, dressing is clean -  CXR prev shows it is short but the tip is no longer twisted -  D/c PICC line   DVT prophylaxis:  heparin Code Status:  Full  Family Communication:  Patient alone Disposition Plan:  D/C to SNF tomorrow, need to coordinate with spouse  Consultants:   Vascular surgery.   Procedures: None.    Antimicrobials: Anti-infectives    Start     Dose/Rate Route Frequency Ordered Stop   02/13/17 2000  cefTAZidime (FORTAZ) 2 g in dextrose 5 % 50 mL IVPB     2 g 100 mL/hr over 30 Minutes Intravenous Every M-W-F (2000) 02/12/17 1035 02/15/17 2111   02/13/17 1711  vancomycin (VANCOCIN) 1-5 GM/200ML-% IVPB    Comments:  [name], [name]   : cabinet  override      02/13/17 1711 02/13/17 1753   02/12/17 0930  cefUROXime (ZINACEF) 1.5 g in dextrose 5 % 50 mL IVPB     1.5 g 100 mL/hr over 30 Minutes Intravenous To Surgery 02/11/17 1706 02/13/17 0930   02/12/17 0926  cefUROXime (ZINACEF) 1.5 g in dextrose 5 % 50 mL IVPB     1.5 g 100 mL/hr over 30 Minutes Intravenous 30 min pre-op 02/11/17 1757 02/14/17 1335   02/11/17 1200  vancomycin (VANCOCIN) IVPB 1000 mg/200 mL premix     1,000 mg 200 mL/hr over 60 Minutes Intravenous Every M-W-F (Hemodialysis) 02/10/17 1355 02/15/17 1159   02/10/17 1400  vancomycin (VANCOCIN) 2,000 mg in sodium chloride 0.9 % 500 mL IVPB     2,000 mg 250 mL/hr over 120 Minutes Intravenous  Once 02/10/17 1355 02/10/17 1900   02/10/17 1400  cefTAZidime (FORTAZ) 1 g in dextrose 5 % 50 mL IVPB     1 g 100 mL/hr over 30 Minutes Intravenous Daily 02/10/17 1356 02/12/17 1735   02/10/17 0930  cefTAZidime (FORTAZ) 1 g in dextrose 5 % 50 mL IVPB  Status:  Discontinued     1 g 100 mL/hr over 30 Minutes Intravenous Every 24 hours 02/10/17 0845 02/10/17 1356   02/10/17 0930  vancomycin (VANCOCIN) 2,000 mg in sodium chloride 0.9 % 500 mL IVPB  Status:  Discontinued     2,000 mg 250 mL/hr over 120 Minutes Intravenous  Once 02/10/17  0845 02/10/17 1355      Subjective:  No complaints. No issues overnight per nursing.  Objective: Vitals:   02/16/17 1818 02/16/17 2139 02/17/17 0553 02/17/17 0846  BP: (!) 147/55 (!) 137/44 (!) 139/43 (!) 164/57  Pulse: 86 80 (!) 57 78  Resp: Temp: 98.6 F (37 C) 98.8 F (37.1 C) 97.6 F (36.4 C) (!) 97.3 F (36.3 C)  TempSrc: Oral Oral Oral Oral  SpO2: 96% 100% 96% 96%  Weight:      Height:        Intake/Output Summary (Last 24 hours) at 02/17/17 1428 Last data filed at 02/17/17 1314  Gross per 24 hour  Intake              937 ml  Output                1 ml  Net              936 ml   Filed Weights   02/14/17 2152 02/15/17 0740 02/15/17 1215  Weight: 92.2 kg (203  lb 4.2 oz) 90.6 kg (199 lb 11.8 oz) 87.8 kg (193 lb 9 oz)    Examination:  General exam: Appears calm and comfortable; A&Ox3 Respiratory system: Clear to auscultation. Respiratory effort normal. Cardiovascular system: S1 & S2 heard, RRR. No JVD, murmurs, rubs, gallops or clicks. No pedal edema. Gastrointestinal system: Abdomen is nondistended, soft and nontender. No organomegaly or masses felt. Normal bowel sounds heard. Central nervous system: Alert and oriented. No focal neurological deficits. Extremities:   R aka Skin: No rashes, lesions or ulcers Psychiatry: Judgement and insight appear normal. Mood & affect appropriate.   Data Reviewed: I have personally reviewed following labs and imaging studies  CBC:  Recent Labs Lab 02/13/17 0710 02/14/17 2115 02/15/17 0213 02/16/17 0339 02/17/17 0209  WBC 8.6 11.1* 9.8 7.7 7.8  NEUTROABS  --   --   --   --  5.8  HGB 9.5* 9.0* 8.4* 8.6* 8.2*  HCT 28.5* 27.7* 26.3* 27.5* 25.8*  MCV 94.1 94.5 94.6 94.8 93.5  PLT 253 263 250 253 264   Basic Metabolic Panel:  Recent Labs Lab 02/11/17 0816  02/13/17 0710  02/14/17 0831 02/14/17 2115 02/15/17 0213 02/16/17 0339 02/17/17 0209  NA 138  < > 135  --  136 136 135 135 133*  K 4.4  < > 4.0  < > 4.3 4.4 4.4 4.4 4.5  CL 94*  < > 97*  --  96* 96* 95* 94* 93*  CO2 29  < > 28  --  32 32  GLUCOSE 75  < > 86  --  89 134* 165* 111* 118*  BUN 42*  < > 29*  --  15 19 23* 14 26*  CREATININE 11.49*  < > 10.15*  --  6.24* 7.46* 7.82* 5.57* 7.47*  CALCIUM 8.4*  < > 8.8*  --  9.3 9.1 9.0 9.5 9.4  PHOS 5.4*  --  4.9*  --   --  4.3  --   --  3.3  < > = values in this interval not displayed. GFR: Estimated Creatinine Clearance: 8.9 mL/min (A) (by C-G formula based on SCr of 7.47 mg/dL (H)). Liver Function Tests:  Recent Labs Lab 02/11/17 0816 02/13/17 0710 02/14/17 2115 02/17/17 0209  ALBUMIN 2.4* 2.5* 2.8* 2.3*   CBG:  Recent Labs Lab 02/16/17 1211 02/16/17 1823  02/16/17 2134 02/17/17 0744 02/17/17 1209  GLUCAP 153* 164* 135* 93 146*     Recent Results (from the past 240 hour(s))  MRSA PCR Screening     Status: Abnormal   Collection Time: 02/10/17  3:33 AM  Result Value Ref Range Status   MRSA by PCR POSITIVE (A) NEGATIVE Final    Comment:        The GeneXpert MRSA Assay (FDA approved for NASAL specimens only), is one component of a comprehensive MRSA colonization surveillance program. It is not intended to diagnose MRSA infection nor to guide or monitor treatment for MRSA infections. RESULT CALLED TO, READ BACK BY AND VERIFIED WITH: IRBY,T RN [phone] AT [phone] [facility]   Surgical pcr screen     Status: None   Collection Time: 02/12/17  5:09 AM  Result Value Ref Range Status   MRSA, PCR NEGATIVE NEGATIVE Final   Staphylococcus aureus NEGATIVE NEGATIVE Final    Comment: (NOTE) The Xpert SA Assay (FDA approved for NASAL specimens in patients 52 years of age and older), is one component of a comprehensive surveillance program. It is not intended to diagnose infection nor to guide or monitor treatment.      Radiology Studies: No results found.  Scheduled Meds: . allopurinol  100 mg Oral QPM  . aspirin EC  81 mg Oral QPM  . calcitRIOL  0.25 mcg Oral Q M,W,F-HD  . cyanocobalamin  250 mcg Oral QPM  . [START ON 02/23/2017] darbepoetin (ARANESP) injection - DIALYSIS  60 mcg Intravenous Q Sat-HD  . feeding supplement (NEPRO CARB STEADY)  237 mL Oral BID BM  . heparin  5,000 Units Subcutaneous Q8H  . multivitamin  1 tablet Oral QHS  . sucroferric oxyhydroxide  1,000 mg Oral TID WC   Continuous Infusions:    LOS: 7 days   Haydee Salter, MD [facility].   If 7PM-7AM, please contact night-coverage [email or web address] Password [email or web address] 02/17/2017, 2:28 PM

## 2017-02-17 NOTE — NC FL2 (Signed)
[place] MEDICAID FL2 LEVEL OF CARE SCREENING TOOL     IDENTIFICATION  Patient Name: [name] Birthdate: Oct 25, 1935 Sex: male Admission Date (Current Location): 02/10/2017  [facility] and [place] Number:  [employer] and Address:  [facility], [address], [place], [place] [address]      Provider Number: [id number]  Attending Physician Name and Address:  Haydee Salter, MD  Relative Name and Phone Number:  Damiean Lukes, [phone]    Current Level of Care: Hospital Recommended Level of Care: Skilled Nursing Facility Prior Approval Number:    Date Approved/Denied:   PASRR Number: [id number]  Discharge Plan: SNF    Current Diagnoses: Patient Active Problem List   Diagnosis Date Noted  . Pressure injury of skin 02/11/2017  . Gangrene of foot (HCC) 02/10/2017  . Sepsis due to cellulitis (HCC) 01/01/2017  . Hypertension 01/01/2017  . Coronary artery disease 01/01/2017  . CKD (chronic kidney disease) stage V requiring chronic dialysis (HCC) 01/01/2017  . Peripheral vascular disease of lower extremity with ulceration/RLE cellulitis (HCC) 01/01/2017  . Acute metabolic encephalopathy 01/01/2017  . Diabetes mellitus with complication (HCC)   . Hyperkalemia 12/26/2016  . Ulcer of right foot due to type 2 diabetes mellitus (HCC) 12/26/2016  . Cellulitis of right foot 12/26/2016  . Noncompliance of patient with renal dialysis (HCC) 12/26/2016  . End stage renal disease (HCC) 09/09/2013    Orientation RESPIRATION BLADDER Height & Weight     Place, Self  Normal Continent Weight: 193 lb 9 oz (87.8 kg) Height:   (177.8 cm)  BEHAVIORAL SYMPTOMS/MOOD NEUROLOGICAL BOWEL NUTRITION STATUS      Continent Diet (renal with fluid restriction/ )  AMBULATORY STATUS COMMUNICATION OF NEEDS Skin   Extensive Assist Verbally Surgical wounds (R AKA)                       Personal Care Assistance Level of Assistance  Bathing,  Feeding, Dressing Bathing Assistance: Limited assistance Feeding assistance: Independent Dressing Assistance: Limited assistance (pt is mod-max assist with lower body dressing)     Functional Limitations Info  Sight, Hearing, Speech Sight Info: Adequate Hearing Info: Adequate Speech Info: Adequate    SPECIAL CARE FACTORS FREQUENCY  PT (By licensed PT), OT (By licensed OT)     PT Frequency: 5x wk OT Frequency: 5x wk            Contractures Contractures Info: Not present    Additional Factors Info  Isolation Precautions, Code Status, Allergies Code Status Info: Full Code Allergies Info: IOHEXOL, ZANAFLEX TIZANIDINE, EZETIMIBE-SIMVASTATIN, FLUVASTATIN, SIMVASTATIN     Isolation Precautions Info: MRSA     Current Medications (02/17/2017):  This is the current hospital active medication list Current Facility-Administered Medications  Medication Dose Route Frequency Provider Last Rate Last Dose  . acetaminophen (TYLENOL) tablet 650 mg  650 mg Oral Q6H PRN Hillary Bow, DO       Or  . acetaminophen (TYLENOL) suppository 650 mg  650 mg Rectal Q6H PRN Hillary Bow, DO      . allopurinol (ZYLOPRIM) tablet 100 mg  100 mg Oral QPM Lyda Perone M, DO   100 mg at 02/16/17 1713  . aspirin EC tablet 81 mg  81 mg Oral QPM Lyda Perone M, DO   81 mg at 02/16/17 1713  . calcitRIOL (ROCALTROL) capsule 0.25 mcg  0.25 mcg Oral Q M,W,F-HD Weston Settle, PA-C   0.25 mcg  at 02/15/17 1006  . cyanocobalamin tablet 250 mcg  250 mcg Oral QPM Lyda Perone M, DO   250 mcg at 02/16/17 1713  . [START ON 02/23/2017] Darbepoetin Alfa (ARANESP) injection 60 mcg  60 mcg Intravenous Q Sat-HD Weston Settle, PA-C      . diphenhydrAMINE (BENADRYL) capsule 25 mg  25 mg Oral Q6H PRN Hillary Bow, DO   25 mg at 02/17/17 0008  . feeding supplement (NEPRO CARB STEADY) liquid 237 mL  237 mL Oral BID BM Weston Settle, PA-C   237 mL at 02/17/17 1032  . heparin injection 5,000 Units  5,000 Units  Subcutaneous Q8H Rhyne, Samantha J, PA-C   5,000 Units at 02/17/17 0747  . morphine 2 MG/ML injection 2 mg  2 mg Intravenous Q2H PRN Rhyne, Samantha J, PA-C      . multivitamin (RENA-VIT) tablet 1 tablet  1 tablet Oral QHS Weston Settle, PA-C   1 tablet at 02/17/17 0009  . ondansetron (ZOFRAN) tablet 4 mg  4 mg Oral Q6H PRN Hillary Bow, DO       Or  . ondansetron [facility]) injection 4 mg  4 mg Intravenous Q6H PRN Hillary Bow, DO      . oxyCODONE (Oxy IR/ROXICODONE) immediate release tablet 10 mg  10 mg Oral Q4H PRN Hillary Bow, DO   10 mg at 02/17/17 1032  . sodium chloride flush (NS) 0.9 % injection 10-40 mL  10-40 mL Intracatheter PRN Hillary Bow, DO   40 mL at 02/12/17 0015  . sucroferric oxyhydroxide (VELPHORO) chewable tablet 1,000 mg  1,000 mg Oral TID WC Hillary Bow, DO   1,000 mg at 02/17/17 1236     Discharge Medications: Please see discharge summary for a list of discharge medications.  Relevant Imaging Results:  Relevant Lab Results:   Additional Information SS#[id number]  Althea Charon, LCSW

## 2017-02-18 ENCOUNTER — Inpatient Hospital Stay (HOSPITAL_COMMUNITY): Payer: Medicare Other

## 2017-02-18 DIAGNOSIS — G9340 Encephalopathy, unspecified: Secondary | ICD-10-CM

## 2017-02-18 LAB — CBC WITH DIFFERENTIAL/PLATELET
BASOS ABS: 0 10*3/uL (ref 0.0–0.1)
BASOS PCT: 0 %
EOS ABS: 0.2 10*3/uL (ref 0.0–0.7)
EOS PCT: 3 %
HCT: 24.7 % — ABNORMAL LOW (ref 39.0–52.0)
Hemoglobin: 7.7 g/dL — ABNORMAL LOW (ref 13.0–17.0)
Lymphocytes Relative: 12 %
Lymphs Abs: 1 10*3/uL (ref 0.7–4.0)
MCH: 29.1 pg (ref 26.0–34.0)
MCHC: 31.2 g/dL (ref 30.0–36.0)
MCV: 93.2 fL (ref 78.0–100.0)
MONO ABS: 0.5 10*3/uL (ref 0.1–1.0)
Monocytes Relative: 6 %
Neutro Abs: 6.2 10*3/uL (ref 1.7–7.7)
Neutrophils Relative %: 79 %
PLATELETS: 257 10*3/uL (ref 150–400)
RBC: 2.65 MIL/uL — ABNORMAL LOW (ref 4.22–5.81)
RDW: 15.3 % (ref 11.5–15.5)
WBC: 7.9 10*3/uL (ref 4.0–10.5)

## 2017-02-18 LAB — RENAL FUNCTION PANEL
ALBUMIN: 2.3 g/dL — AB (ref 3.5–5.0)
Anion gap: 9 (ref 5–15)
BUN: 38 mg/dL — AB (ref 6–20)
CALCIUM: 9.4 mg/dL (ref 8.9–10.3)
CO2: 31 mmol/L (ref 22–32)
CREATININE: 9.4 mg/dL — AB (ref 0.61–1.24)
Chloride: 91 mmol/L — ABNORMAL LOW (ref 101–111)
GFR calc Af Amer: 5 mL/min — ABNORMAL LOW (ref >60)
GFR calc non Af Amer: 5 mL/min — ABNORMAL LOW (ref >60)
GLUCOSE: 116 mg/dL — AB (ref 65–99)
PHOSPHORUS: 3.1 mg/dL (ref 2.5–4.6)
Potassium: 5 mmol/L (ref 3.5–5.1)
SODIUM: 131 mmol/L — AB (ref 135–145)

## 2017-02-18 LAB — GLUCOSE, CAPILLARY
Glucose-Capillary: 93 mg/dL (ref 65–99)
Glucose-Capillary: 114 mg/dL — ABNORMAL HIGH (ref 65–99)

## 2017-02-18 MED ORDER — PENTAFLUOROPROP-TETRAFLUOROETH EX AERO: 1.0000 "application " | INHALATION_SPRAY | CUTANEOUS | Status: DC | PRN

## 2017-02-18 MED ORDER — HEPARIN SODIUM (PORCINE) 1000 UNIT/ML DIALYSIS: 1000.0000 [IU] | INTRAMUSCULAR | Status: DC | PRN

## 2017-02-18 MED ORDER — GLYCERIN (LAXATIVE) 2.1 G RE SUPP
1.0000 | Freq: Once | RECTAL | Status: DC
  Filled 2017-02-18: qty 1

## 2017-02-18 MED ORDER — POVIDONE-IODINE 10 % EX SOLN
Freq: Every day | CUTANEOUS | Status: DC
  Administered 2017-02-19: 14:00:00 via TOPICAL
  Filled 2017-02-18: qty 118

## 2017-02-18 MED ORDER — LIDOCAINE HCL (PF) 1 % IJ SOLN: 5.0000 mL | INTRAMUSCULAR | Status: DC | PRN

## 2017-02-18 MED ORDER — LIDOCAINE-PRILOCAINE 2.5-2.5 % EX CREA: 1.0000 "application " | TOPICAL_CREAM | CUTANEOUS | Status: DC | PRN

## 2017-02-18 MED ORDER — CALCITRIOL 0.25 MCG PO CAPS
ORAL_CAPSULE | ORAL | Status: AC
  Filled 2017-02-18: qty 1

## 2017-02-18 MED ORDER — SODIUM CHLORIDE 0.9 % IV SOLN: 100.0000 mL | INTRAVENOUS | Status: DC | PRN

## 2017-02-18 NOTE — Progress Notes (Signed)
[facility] Progress Note   Dialysis Orders: [facility] MWF 4h  2/2   100.5kg   Hep 7500   LFA AVG Mircera 50 q 2 wks - last 9/22, calcitriol 0.25 Parsabiv 5 Recent labs: hgb 10.8 29% sat iPTH 1780 Ca 9.8 P 6.8  Assessment/Plan: 1. Bilateral infected and ischemic toes R >L;s/p right AKA 9/27 on empiric Vanc and Fortaz. Per VVS left toes are ischemic and at risk of progressing 2. ESRD- cont MWF HD 3. Hypertension/volume- Anemia- hgb drifting down post-op, transfuse prn. Next ESA due 10/6  4. Metabolic bone disease- Continue calcitriol and binders; Parsabiv not avail in hospital Ca/P ok 5. Nutrition- renal diet/multivitamin/Nepro alb 2.3 - intake very poor 6. Disp - likely will need SNF - need to address goals of care . Pt confused.  Have consulted Pall care team.    Vinson Moselle MD [facility] pgr [phone]   02/18/2017, 9:30 AM   Subjective:   Pt confused, says he got up and walked this morning Objective Vitals:   02/18/17 0824 02/18/17 0845 02/18/17 0900 02/18/17 0915  BP: (!) 109/56 (!) 96/49 (!) 110/52 (!) 113/52  Pulse: 73 72 71 70  Resp:      Temp:      TempSrc:      SpO2:      Weight:      Height:       Physical Exam General: sleeping, rouses easily. Heart: RRR Lungs: no rales Abdomen: soft obese NT Extremities: no LE edema - s/p R AKA bandaged  Dialysis Access: left lower AVGG + bruit   Additional Objective Labs: Basic Metabolic Panel:  Recent Labs Lab 02/14/17 2115  02/16/17 0339 02/17/17 0209 02/18/17 0220  NA 136  < > 135 133* 131*  K 4.4  < > 4.4 4.5 5.0  CL 96*  < > 94* 93* 91*  CO2 31  < > 32 32 31  GLUCOSE 134*  < > 111* 118* 116*  BUN 19  < > 14 26* 38*  CREATININE 7.46*  < > 5.57* 7.47* 9.40*  CALCIUM 9.1  < > 9.5 9.4 9.4  PHOS 4.3  --   --  3.3 3.1  < > = values in this interval not displayed. Liver Function Tests:  Recent Labs Lab 02/14/17 2115 02/17/17 0209 02/18/17 0220  ALBUMIN  2.8* 2.3* 2.3*   CBC:  Recent Labs Lab 02/14/17 2115 02/15/17 0213 02/16/17 0339 02/17/17 0209 02/17/17 1834 02/18/17 0220  WBC 11.1* 9.8 7.7 7.8  --  7.9  NEUTROABS  --   --   --  5.8  --  6.2  HGB 9.0* 8.4* 8.6* 8.2* 9.1* 7.7*  HCT 27.7* 26.3* 27.5* 25.8* 27.6* 24.7*  MCV 94.5 94.6 94.8 93.5  --  93.2  PLT 263 250 253 264  --  257   CBG:  Recent Labs Lab 02/17/17 0744 02/17/17 1209 02/17/17 1642 02/17/17 2115 02/18/17 0758  GLUCAP 93 146* 139* 127* 93   Medications: . sodium chloride    . sodium chloride     . allopurinol  100 mg Oral QPM  . aspirin EC  81 mg Oral QPM  . calcitRIOL  0.25 mcg Oral Q M,W,F-HD  . cyanocobalamin  250 mcg Oral QPM  . [START ON 02/23/2017] darbepoetin (ARANESP) injection - DIALYSIS  60 mcg Intravenous Q Sat-HD  . feeding supplement (NEPRO CARB STEADY)  237 mL Oral BID BM  . heparin  5,000 Units Subcutaneous Q8H  .  multivitamin  1 tablet Oral QHS  . sucroferric oxyhydroxide  1,000 mg Oral TID WC

## 2017-02-18 NOTE — Plan of Care (Signed)
Problem: Education: Goal: Knowledge of [facility] Education information/materials will improve Outcome: Progressing POC reviewed with pt.- pt. Seem to understand some of the info.

## 2017-02-18 NOTE — Progress Notes (Signed)
PT NOTE:   Pt currently in HD. Will check back as time allows. See previous PT note for most recent progress update.   Lyanne Co, PT  Acute Rehab Services  [phone]

## 2017-02-18 NOTE — Progress Notes (Addendum)
PROGRESS NOTE    [name]  RUE:[id number] DOB: 03-25-1936 DOA: 02/10/2017 PCP: Crist Fat, MD    Brief Narrative: 50 transferred from [facility] with AMS and gangrene foot.  Vascular performed AKA.  He also has ESRD and has been on HD MWF, along with anemia.  He was on Van/Fortaz, and mental status improved marginally again postoperatively antibiotics were stopped. Mentation improvement stagnant. Trending hemoglobin due to small drift down. Palliative to see patient.   Assessment & Plan:   Principal Problem:   Gangrene of foot (HCC) Active Problems:   End stage renal disease (HCC)   Hypertension   Acute metabolic encephalopathy   Diabetes mellitus with complication (HCC)   Pressure injury of skin  Gangrene of the bilateral feet, right greater than left.   -  Vascular surgery assistance appreciated -  stopped abx - considering doxy for prophylaxis - poor surgical candidate  Anemia - possible transfusion - trending H/H - will type and screen  Acute metabolic encephalopathy, improved -  off abx - per family patient likely at baseline - will discharge to SNF - hold of pain meds for tonight; check UA, CXR, head CT  ESRD -  Nephrology consulting and patient receiving intermittent HD  Hypertension,  - currently on no medications  Diabetes mellitus type 2, on no medications -  Patient refusing CBG/insulin  Right neck central line, site appears non-erythematous, dressing is clean -  CXR prev shows it is short but the tip is no longer twisted -  D/c PICC line   DVT prophylaxis:  heparin Code Status:  Full  Family Communication:  Patient alone Disposition Plan:  D/C to SNF tomorrow, need to coordinate with spouse  Consultants:   Vascular surgery.   Procedures: None.    Antimicrobials: Anti-infectives    Start     Dose/Rate Route Frequency Ordered Stop   02/13/17 2000  cefTAZidime (FORTAZ) 2 g in dextrose 5 % 50 mL IVPB     2 g 100 mL/hr  over 30 Minutes Intravenous Every M-W-F (2000) 02/12/17 1035 02/15/17 2111   02/13/17 1711  vancomycin (VANCOCIN) 1-5 GM/200ML-% IVPB    Comments:  [name], [name]   : cabinet override      02/13/17 1711 02/13/17 1753   02/12/17 0930  cefUROXime (ZINACEF) 1.5 g in dextrose 5 % 50 mL IVPB     1.5 g 100 mL/hr over 30 Minutes Intravenous To Surgery 02/11/17 1706 02/13/17 0930   02/12/17 0926  cefUROXime (ZINACEF) 1.5 g in dextrose 5 % 50 mL IVPB     1.5 g 100 mL/hr over 30 Minutes Intravenous 30 min pre-op 02/11/17 1757 02/14/17 1335   02/11/17 1200  vancomycin (VANCOCIN) IVPB 1000 mg/200 mL premix     1,000 mg 200 mL/hr over 60 Minutes Intravenous Every M-W-F (Hemodialysis) 02/10/17 1355 02/15/17 1159   02/10/17 1400  vancomycin (VANCOCIN) 2,000 mg in sodium chloride 0.9 % 500 mL IVPB     2,000 mg 250 mL/hr over 120 Minutes Intravenous  Once 02/10/17 1355 02/10/17 1900   02/10/17 1400  cefTAZidime (FORTAZ) 1 g in dextrose 5 % 50 mL IVPB     1 g 100 mL/hr over 30 Minutes Intravenous Daily 02/10/17 1356 02/12/17 1735   02/10/17 0930  cefTAZidime (FORTAZ) 1 g in dextrose 5 % 50 mL IVPB  Status:  Discontinued     1 g 100 mL/hr over 30 Minutes Intravenous Every 24 hours 02/10/17 0845 02/10/17 1356   02/10/17 0930  vancomycin (VANCOCIN) 2,000 mg in sodium chloride 0.9 % 500 mL IVPB  Status:  Discontinued     2,000 mg 250 mL/hr over 120 Minutes Intravenous  Once 02/10/17 0845 02/10/17 1355      Subjective:  No complaints. No issues overnight per nursing.  Objective: Vitals:   02/18/17 1130 02/18/17 1200 02/18/17 1230 02/18/17 1235  BP: (!) 109/48 (!) 128/55 (!) 113/57 132/60  Pulse: 72 73 75 75  Resp:    16  Temp:    97.7 F (36.5 C)  TempSrc:    Oral  SpO2:    98%  Weight:    90.1 kg (198 lb 10.2 oz)  Height:        Intake/Output Summary (Last 24 hours) at 02/18/17 1440 Last data filed at 02/18/17 1235  Gross per 24 hour  Intake              320 ml  Output             1501 ml    Net            -1181 ml   Filed Weights   02/17/17 2139 02/18/17 0810 02/18/17 1235  Weight: 90.7 kg (200 lb) 91.9 kg (202 lb 9.6 oz) 90.1 kg (198 lb 10.2 oz)    Examination:  General exam: Appears calm and comfortable; A&Ox3 Respiratory system: Clear to auscultation. Respiratory effort normal. Cardiovascular system: S1 & S2 heard, RRR. No JVD, murmurs, rubs, gallops or clicks. No pedal edema. Gastrointestinal system: Abdomen is nondistended, soft and nontender. No organomegaly or masses felt. Normal bowel sounds heard. Central nervous system: Alert and oriented. No focal neurological deficits. Extremities:   R aka Skin: No rashes, lesions or ulcers Psychiatry: Judgement and insight appear normal. Mood & affect appropriate.   Data Reviewed: I have personally reviewed following labs and imaging studies  CBC:  Recent Labs Lab 02/14/17 2115 02/15/17 0213 02/16/17 0339 02/17/17 0209 02/17/17 1834 02/18/17 0220  WBC 11.1* 9.8 7.7 7.8  --  7.9  NEUTROABS  --   --   --  5.8  --  6.2  HGB 9.0* 8.4* 8.6* 8.2* 9.1* 7.7*  HCT 27.7* 26.3* 27.5* 25.8* 27.6* 24.7*  MCV 94.5 94.6 94.8 93.5  --  93.2  PLT 263 250 253 264  --  257   Basic Metabolic Panel:  Recent Labs Lab 02/13/17 0710  02/14/17 2115 02/15/17 0213 02/16/17 0339 02/17/17 0209 02/18/17 0220  NA 135  < > 136 135 135 133* 131*  K 4.0  < > 4.4 4.4 4.4 4.5 5.0  CL 97*  < > 96* 95* 94* 93* 91*  CO2 28  < > 31 30 32 32 31  GLUCOSE 86  < > 134* 165* 111* 118* 116*  BUN 29*  < > 19 23* 14 26* 38*  CREATININE 10.15*  < > 7.46* 7.82* 5.57* 7.47* 9.40*  CALCIUM 8.8*  < > 9.1 9.0 9.5 9.4 9.4  PHOS 4.9*  --  4.3  --   --  3.3 3.1  < > = values in this interval not displayed. GFR: Estimated Creatinine Clearance: 7.2 mL/min (A) (by C-G formula based on SCr of 9.4 mg/dL (H)). Liver Function Tests:  Recent Labs Lab 02/13/17 0710 02/14/17 2115 02/17/17 0209 02/18/17 0220  ALBUMIN 2.5* 2.8* 2.3* 2.3*   CBG:  Recent  Labs Lab 02/17/17 0744 02/17/17 1209 02/17/17 1642 02/17/17 2115 02/18/17 0758  GLUCAP 93 146* 139* 127* 93  Recent Results (from the past 240 hour(s))  MRSA PCR Screening     Status: Abnormal   Collection Time: 02/10/17  3:33 AM  Result Value Ref Range Status   MRSA by PCR POSITIVE (A) NEGATIVE Final    Comment:        The GeneXpert MRSA Assay (FDA approved for NASAL specimens only), is one component of a comprehensive MRSA colonization surveillance program. It is not intended to diagnose MRSA infection nor to guide or monitor treatment for MRSA infections. RESULT CALLED TO, READ BACK BY AND VERIFIED WITH: IRBY,T RN [phone] AT [phone] [facility]   Surgical pcr screen     Status: None   Collection Time: 02/12/17  5:09 AM  Result Value Ref Range Status   MRSA, PCR NEGATIVE NEGATIVE Final   Staphylococcus aureus NEGATIVE NEGATIVE Final    Comment: (NOTE) The Xpert SA Assay (FDA approved for NASAL specimens in patients 38 years of age and older), is one component of a comprehensive surveillance program. It is not intended to diagnose infection nor to guide or monitor treatment.      Radiology Studies: No results found.  Scheduled Meds: . allopurinol  100 mg Oral QPM  . aspirin EC  81 mg Oral QPM  . calcitRIOL  0.25 mcg Oral Q M,W,F-HD  . cyanocobalamin  250 mcg Oral QPM  . [START ON 02/23/2017] darbepoetin (ARANESP) injection - DIALYSIS  60 mcg Intravenous Q Sat-HD  . feeding supplement (NEPRO CARB STEADY)  237 mL Oral BID BM  . heparin  5,000 Units Subcutaneous Q8H  . multivitamin  1 tablet Oral QHS  . sucroferric oxyhydroxide  1,000 mg Oral TID WC   Continuous Infusions:    LOS: 8 days   Haydee Salter, MD [facility].   If 7PM-7AM, please contact night-coverage [email or web address] Password [email or web address] 02/18/2017, 2:40 PM

## 2017-02-18 NOTE — Progress Notes (Signed)
Palliative Medicine consult noted. Due to high referral volume, there may be a delay seeing this patient. Please call the Palliative Medicine Team office at [phone] if recommendations are needed in the interim.  Thank you for inviting us to see this patient.  Amahd Morino G. Juan Kissoon, RN, BSN, CHPN 02/18/2017 3:28 PM Cell [phone] 8:00-4:00 Monday-Friday Office [phone] 

## 2017-02-18 NOTE — Progress Notes (Signed)
  Progress Note    02/18/2017 7:48 AM 4 Days Post-Op  Subjective:  Confused, wants to get out of bed  Vitals:   02/17/17 2139 02/18/17 0353  BP: (!) 129/54 (!) 131/48  Pulse: 74 76  Resp: 16 16  Temp: 97.8 F (36.6 C) 98.2 F (36.8 C)  SpO2: 100% 99%    Physical Exam: Alert but confused Non labored respirations Abdomen is soft Right aka site cdi with staples Left ischemic discoloration of 2-4 toes is stable  CBC    Component Value Date/Time   WBC 7.9 02/18/2017 0220   RBC 2.65 (L) 02/18/2017 0220   HGB 7.7 (L) 02/18/2017 0220   HCT 24.7 (L) 02/18/2017 0220   PLT 257 02/18/2017 0220   MCV 93.2 02/18/2017 0220   MCH 29.1 02/18/2017 0220   MCHC 31.2 02/18/2017 0220   RDW 15.3 02/18/2017 0220   LYMPHSABS 1.0 02/18/2017 0220   MONOABS 0.5 02/18/2017 0220   EOSABS 0.2 02/18/2017 0220   BASOSABS 0.0 02/18/2017 0220    BMET    Component Value Date/Time   NA 131 (L) 02/18/2017 0220   K 5.0 02/18/2017 0220   CL 91 (L) 02/18/2017 0220   CO2 31 02/18/2017 0220   GLUCOSE 116 (H) 02/18/2017 0220   BUN 38 (H) 02/18/2017 0220   CREATININE 9.40 (H) 02/18/2017 0220   CALCIUM 9.4 02/18/2017 0220   CALCIUM 8.4 11/29/2007 1515   GFRNONAA 5 (L) 02/18/2017 0220   GFRAA 5 (L) 02/18/2017 0220    INR    Component Value Date/Time   INR 1.10 01/01/2017 1429     Intake/Output Summary (Last 24 hours) at 02/18/17 0748 Last data filed at 02/18/17 0354  Gross per 24 hour  Intake             1017 ml  Output                2 ml  Net             1015 ml     Assessment:  81 y.o. male is s/p right aka for foot gangrene, has ischemic appearing toes on the left  Plan: He does not have reconstructable vascular disease on the left and if toes progress he will eventually require left above knee amputation as well. Would benefit from betadine paint to left toes and goals of care discussion.  Has f/u on 10.26 scheduled.     Lynisha Osuch C. Randie Heinz, MD [facility] [place] Office: [phone] Pager: [phone]  02/18/2017 7:48 AM

## 2017-02-19 ENCOUNTER — Encounter (HOSPITAL_COMMUNITY): Payer: Self-pay | Admitting: General Practice

## 2017-02-19 DIAGNOSIS — R262 Difficulty in walking, not elsewhere classified: Secondary | ICD-10-CM | POA: Diagnosis not present

## 2017-02-19 DIAGNOSIS — E1129 Type 2 diabetes mellitus with other diabetic kidney complication: Secondary | ICD-10-CM | POA: Diagnosis not present

## 2017-02-19 DIAGNOSIS — E875 Hyperkalemia: Secondary | ICD-10-CM | POA: Diagnosis not present

## 2017-02-19 DIAGNOSIS — L03116 Cellulitis of left lower limb: Secondary | ICD-10-CM | POA: Diagnosis not present

## 2017-02-19 DIAGNOSIS — I96 Gangrene, not elsewhere classified: Secondary | ICD-10-CM | POA: Diagnosis not present

## 2017-02-19 DIAGNOSIS — M109 Gout, unspecified: Secondary | ICD-10-CM | POA: Diagnosis not present

## 2017-02-19 DIAGNOSIS — Z992 Dependence on renal dialysis: Secondary | ICD-10-CM | POA: Diagnosis not present

## 2017-02-19 DIAGNOSIS — Z23 Encounter for immunization: Secondary | ICD-10-CM | POA: Diagnosis not present

## 2017-02-19 DIAGNOSIS — T829XXD Unspecified complication of cardiac and vascular prosthetic device, implant and graft, subsequent encounter: Secondary | ICD-10-CM | POA: Diagnosis not present

## 2017-02-19 DIAGNOSIS — T82858A Stenosis of vascular prosthetic devices, implants and grafts, initial encounter: Secondary | ICD-10-CM | POA: Diagnosis not present

## 2017-02-19 DIAGNOSIS — E11621 Type 2 diabetes mellitus with foot ulcer: Secondary | ICD-10-CM | POA: Diagnosis not present

## 2017-02-19 DIAGNOSIS — A419 Sepsis, unspecified organism: Secondary | ICD-10-CM | POA: Diagnosis not present

## 2017-02-19 DIAGNOSIS — I2510 Atherosclerotic heart disease of native coronary artery without angina pectoris: Secondary | ICD-10-CM | POA: Diagnosis not present

## 2017-02-19 DIAGNOSIS — I739 Peripheral vascular disease, unspecified: Secondary | ICD-10-CM | POA: Diagnosis not present

## 2017-02-19 DIAGNOSIS — N186 End stage renal disease: Secondary | ICD-10-CM | POA: Diagnosis not present

## 2017-02-19 DIAGNOSIS — Z95828 Presence of other vascular implants and grafts: Secondary | ICD-10-CM | POA: Diagnosis not present

## 2017-02-19 DIAGNOSIS — R51 Headache: Secondary | ICD-10-CM | POA: Diagnosis not present

## 2017-02-19 DIAGNOSIS — E1152 Type 2 diabetes mellitus with diabetic peripheral angiopathy with gangrene: Secondary | ICD-10-CM | POA: Diagnosis not present

## 2017-02-19 DIAGNOSIS — R52 Pain, unspecified: Secondary | ICD-10-CM | POA: Diagnosis not present

## 2017-02-19 DIAGNOSIS — I2584 Coronary atherosclerosis due to calcified coronary lesion: Secondary | ICD-10-CM | POA: Diagnosis not present

## 2017-02-19 DIAGNOSIS — Z741 Need for assistance with personal care: Secondary | ICD-10-CM | POA: Diagnosis not present

## 2017-02-19 DIAGNOSIS — N2581 Secondary hyperparathyroidism of renal origin: Secondary | ICD-10-CM | POA: Diagnosis not present

## 2017-02-19 DIAGNOSIS — Z7409 Other reduced mobility: Secondary | ICD-10-CM | POA: Diagnosis not present

## 2017-02-19 DIAGNOSIS — D689 Coagulation defect, unspecified: Secondary | ICD-10-CM | POA: Diagnosis not present

## 2017-02-19 DIAGNOSIS — I252 Old myocardial infarction: Secondary | ICD-10-CM | POA: Diagnosis not present

## 2017-02-19 DIAGNOSIS — Z4781 Encounter for orthopedic aftercare following surgical amputation: Secondary | ICD-10-CM | POA: Diagnosis not present

## 2017-02-19 DIAGNOSIS — G9340 Encephalopathy, unspecified: Secondary | ICD-10-CM | POA: Diagnosis not present

## 2017-02-19 DIAGNOSIS — E538 Deficiency of other specified B group vitamins: Secondary | ICD-10-CM | POA: Diagnosis not present

## 2017-02-19 DIAGNOSIS — D631 Anemia in chronic kidney disease: Secondary | ICD-10-CM | POA: Diagnosis not present

## 2017-02-19 DIAGNOSIS — M6281 Muscle weakness (generalized): Secondary | ICD-10-CM | POA: Diagnosis not present

## 2017-02-19 DIAGNOSIS — Z9115 Patient's noncompliance with renal dialysis: Secondary | ICD-10-CM | POA: Diagnosis not present

## 2017-02-19 DIAGNOSIS — I120 Hypertensive chronic kidney disease with stage 5 chronic kidney disease or end stage renal disease: Secondary | ICD-10-CM | POA: Diagnosis not present

## 2017-02-19 DIAGNOSIS — D509 Iron deficiency anemia, unspecified: Secondary | ICD-10-CM | POA: Diagnosis not present

## 2017-02-19 LAB — CBC WITH DIFFERENTIAL/PLATELET
BASOS ABS: 0 10*3/uL (ref 0.0–0.1)
BASOS PCT: 0 %
Eosinophils Absolute: 0.2 10*3/uL (ref 0.0–0.7)
Eosinophils Relative: 4 %
HEMATOCRIT: 25.6 % — AB (ref 39.0–52.0)
HEMOGLOBIN: 8 g/dL — AB (ref 13.0–17.0)
LYMPHS PCT: 12 %
Lymphs Abs: 0.8 10*3/uL (ref 0.7–4.0)
MCH: 29.2 pg (ref 26.0–34.0)
MCHC: 31.3 g/dL (ref 30.0–36.0)
MCV: 93.4 fL (ref 78.0–100.0)
MONOS PCT: 10 %
Monocytes Absolute: 0.6 10*3/uL (ref 0.1–1.0)
NEUTROS ABS: 5 10*3/uL (ref 1.7–7.7)
NEUTROS PCT: 74 %
Platelets: 267 10*3/uL (ref 150–400)
RBC: 2.74 MIL/uL — ABNORMAL LOW (ref 4.22–5.81)
RDW: 15.2 % (ref 11.5–15.5)
WBC: 6.7 10*3/uL (ref 4.0–10.5)

## 2017-02-19 LAB — RENAL FUNCTION PANEL
ANION GAP: 10 (ref 5–15)
Albumin: 2.3 g/dL — ABNORMAL LOW (ref 3.5–5.0)
BUN: 22 mg/dL — ABNORMAL HIGH (ref 6–20)
CALCIUM: 9.4 mg/dL (ref 8.9–10.3)
CO2: 28 mmol/L (ref 22–32)
Chloride: 93 mmol/L — ABNORMAL LOW (ref 101–111)
Creatinine, Ser: 6.43 mg/dL — ABNORMAL HIGH (ref 0.61–1.24)
GFR calc non Af Amer: 7 mL/min — ABNORMAL LOW (ref >60)
GFR, EST AFRICAN AMERICAN: 8 mL/min — AB (ref >60)
Glucose, Bld: 85 mg/dL (ref 65–99)
PHOSPHORUS: 3.5 mg/dL (ref 2.5–4.6)
POTASSIUM: 4.3 mmol/L (ref 3.5–5.1)
SODIUM: 131 mmol/L — AB (ref 135–145)

## 2017-02-19 LAB — GLUCOSE, CAPILLARY
GLUCOSE-CAPILLARY: 109 mg/dL — AB (ref 65–99)
GLUCOSE-CAPILLARY: 83 mg/dL (ref 65–99)
GLUCOSE-CAPILLARY: 124 mg/dL — AB (ref 65–99)
Glucose-Capillary: 75 mg/dL (ref 65–99)
Glucose-Capillary: 120 mg/dL — ABNORMAL HIGH (ref 65–99)

## 2017-02-19 MED ORDER — RENA-VITE PO TABS: 1.0000 | ORAL_TABLET | Freq: Every day | ORAL | 0 refills | Status: AC

## 2017-02-19 MED ORDER — CALCITRIOL 0.25 MCG PO CAPS: 0.2500 ug | ORAL_CAPSULE | ORAL | 0 refills | Status: DC

## 2017-02-19 MED ORDER — GLYCERIN (LAXATIVE) 2.1 G RE SUPP: 1.0000 | Freq: Every day | RECTAL | 0 refills | Status: DC | PRN

## 2017-02-19 MED ORDER — NEPRO/CARBSTEADY PO LIQD: 237.0000 mL | Freq: Two times a day (BID) | ORAL | 0 refills | Status: AC

## 2017-02-19 MED ORDER — POVIDONE-IODINE 10 % EX SOLN: Freq: Every day | CUTANEOUS | 0 refills | Status: DC

## 2017-02-19 NOTE — Progress Notes (Signed)
PROGRESS NOTE    [name]  ZOX:[id number] DOB: 1935/07/25 DOA: 02/10/2017 PCP: Crist Fat, MD    Brief Narrative: 42 transferred from [facility] with AMS and gangrene foot.  Vascular performed AKA.  He also has ESRD and has been on HD MWF, along with anemia.  He was on Van/Fortaz, and mental status improved marginally again postoperatively antibiotics were stopped. Mentation improvement stagnant. Trending hemoglobin due to small drift down. Palliative to see patient.   Assessment & Plan:   Principal Problem:   Gangrene of foot (HCC) Active Problems:   End stage renal disease (HCC)   Hypertension   Acute metabolic encephalopathy   Diabetes mellitus with complication (HCC)   Pressure injury of skin  Gangrene of the bilateral feet, right greater than left.   -  Vascular surgery assistance appreciated status post right AKA -  stopped abx - considering doxy for prophylaxis - poor surgical candidate for left AKA - Lantus to discharge patient with outpatient palliative care consult  Anemia - possible transfusion, stable - trending H/H - will type and screen  Acute metabolic encephalopathy, improved -  off abx - per family patient likely at baseline - will discharge to SNF - hold of pain meds for tonight; check UA, CXR, head CT  ESRD -  Nephrology consulting and patient receiving intermittent HD  Hypertension,  - currently on no medications  Diabetes mellitus type 2, on no medications -  Patient refusing CBG/insulin  Right neck central line, site appears non-erythematous, dressing is clean -  CXR prev shows it is short but the tip is no longer twisted -  D/c PICC line   DVT prophylaxis:  heparin Code Status:  Full  Family Communication:  Patient alone Disposition Plan:  D/C to SNF today, need to coordinate with spouse  Consultants:   Vascular surgery.   Procedures: None.    Antimicrobials: Anti-infectives    Start     Dose/Rate Route  Frequency Ordered Stop   02/13/17 2000  cefTAZidime (FORTAZ) 2 g in dextrose 5 % 50 mL IVPB     2 g 100 mL/hr over 30 Minutes Intravenous Every M-W-F (2000) 02/12/17 1035 02/15/17 2111   02/13/17 1711  vancomycin (VANCOCIN) 1-5 GM/200ML-% IVPB    Comments:  [name], [name]   : cabinet override      02/13/17 1711 02/13/17 1753   02/12/17 0930  cefUROXime (ZINACEF) 1.5 g in dextrose 5 % 50 mL IVPB     1.5 g 100 mL/hr over 30 Minutes Intravenous To Surgery 02/11/17 1706 02/13/17 0930   02/12/17 0926  cefUROXime (ZINACEF) 1.5 g in dextrose 5 % 50 mL IVPB     1.5 g 100 mL/hr over 30 Minutes Intravenous 30 min pre-op 02/11/17 1757 02/14/17 1335   02/11/17 1200  vancomycin (VANCOCIN) IVPB 1000 mg/200 mL premix     1,000 mg 200 mL/hr over 60 Minutes Intravenous Every M-W-F (Hemodialysis) 02/10/17 1355 02/15/17 1159   02/10/17 1400  vancomycin (VANCOCIN) 2,000 mg in sodium chloride 0.9 % 500 mL IVPB     2,000 mg 250 mL/hr over 120 Minutes Intravenous  Once 02/10/17 1355 02/10/17 1900   02/10/17 1400  cefTAZidime (FORTAZ) 1 g in dextrose 5 % 50 mL IVPB     1 g 100 mL/hr over 30 Minutes Intravenous Daily 02/10/17 1356 02/12/17 1735   02/10/17 0930  cefTAZidime (FORTAZ) 1 g in dextrose 5 % 50 mL IVPB  Status:  Discontinued     1  g 100 mL/hr over 30 Minutes Intravenous Every 24 hours 02/10/17 0845 02/10/17 1356   02/10/17 0930  vancomycin (VANCOCIN) 2,000 mg in sodium chloride 0.9 % 500 mL IVPB  Status:  Discontinued     2,000 mg 250 mL/hr over 120 Minutes Intravenous  Once 02/10/17 0845 02/10/17 1355      Subjective:  No complaints. No issues overnight per nursing.  Objective: Vitals:   02/18/17 1235 02/18/17 2155 02/19/17 0449 02/19/17 0951  BP: 132/60 (!) 128/46 (!) 141/47 (!) 145/50  Pulse: 75 78 75 75  Resp: Temp: 97.7 F (36.5 C) 97.8 F (36.6 C) 98.1 F (36.7 C) 98 F (36.7 C)  TempSrc: Oral Oral Oral Oral  SpO2: 98% 100% 100% 99%  Weight: 90.1 kg (198 lb 10.2 oz)  90.2 kg (198 lb 13.7 oz)    Height:        Intake/Output Summary (Last 24 hours) at 02/19/17 1424 Last data filed at 02/19/17 1300  Gross per 24 hour  Intake              360 ml  Output                0 ml  Net              360 ml   Filed Weights   02/18/17 0810 02/18/17 1235 02/18/17 2155  Weight: 91.9 kg (202 lb 9.6 oz) 90.1 kg (198 lb 10.2 oz) 90.2 kg (198 lb 13.7 oz)    Examination:  General exam: Appears calm and comfortable; A&Ox3 Respiratory system: Clear to auscultation. Respiratory effort normal. Cardiovascular system: S1 & S2 heard, RRR. No JVD, murmurs, rubs, gallops or clicks. No pedal edema. Gastrointestinal system: Abdomen is nondistended, soft and nontender. No organomegaly or masses felt. Normal bowel sounds heard. Central nervous system: Alert and oriented. No focal neurological deficits. Extremities:   R aka Skin: No rashes, lesions or ulcers Psychiatry: Judgement and insight appear normal. Mood & affect appropriate.   Data Reviewed: I have personally reviewed following labs and imaging studies  CBC:  Recent Labs Lab 02/15/17 0213 02/16/17 0339 02/17/17 0209 02/17/17 1834 02/18/17 0220 02/19/17 0548  WBC 9.8 7.7 7.8  --  7.9 6.7  NEUTROABS  --   --  5.8  --  6.2 5.0  HGB 8.4* 8.6* 8.2* 9.1* 7.7* 8.0*  HCT 26.3* 27.5* 25.8* 27.6* 24.7* 25.6*  MCV 94.6 94.8 93.5  --  93.2 93.4  PLT 250 253 264  --  257 267   Basic Metabolic Panel:  Recent Labs Lab 02/13/17 0710  02/14/17 2115 02/15/17 0213 02/16/17 0339 02/17/17 0209 02/18/17 0220 02/19/17 0548  NA 135  < > 136 135 135 133* 131* 131*  K 4.0  < > 4.4 4.4 4.4 4.5 5.0 4.3  CL 97*  < > 96* 95* 94* 93* 91* 93*  CO2 28  < > 31 30 32 32 31 28  GLUCOSE 86  < > 134* 165* 111* 118* 116* 85  BUN 29*  < > 19 23* 14 26* 38* 22*  CREATININE 10.15*  < > 7.46* 7.82* 5.57* 7.47* 9.40* 6.43*  CALCIUM 8.8*  < > 9.1 9.0 9.5 9.4 9.4 9.4  PHOS 4.9*  --  4.3  --   --  3.3 3.1 3.5  < > = values in this  interval not displayed. GFR: Estimated Creatinine Clearance: 10.5 mL/min (A) (by C-G formula based on SCr of 6.43 mg/dL (  H)). Liver Function Tests:  Recent Labs Lab 02/13/17 0710 02/14/17 2115 02/17/17 0209 02/18/17 0220 02/19/17 0548  ALBUMIN 2.5* 2.8* 2.3* 2.3* 2.3*   CBG:  Recent Labs Lab 02/18/17 1348 02/18/17 1649 02/18/17 2152 02/19/17 0814 02/19/17 1144  GLUCAP 75 109* 114* 83 120*     Recent Results (from the past 240 hour(s))  MRSA PCR Screening     Status: Abnormal   Collection Time: 02/10/17  3:33 AM  Result Value Ref Range Status   MRSA by PCR POSITIVE (A) NEGATIVE Final    Comment:        The GeneXpert MRSA Assay (FDA approved for NASAL specimens only), is one component of a comprehensive MRSA colonization surveillance program. It is not intended to diagnose MRSA infection nor to guide or monitor treatment for MRSA infections. RESULT CALLED TO, READ BACK BY AND VERIFIED WITH: IRBY,T RN [phone] AT [phone] [facility]   Surgical pcr screen     Status: None   Collection Time: 02/12/17  5:09 AM  Result Value Ref Range Status   MRSA, PCR NEGATIVE NEGATIVE Final   Staphylococcus aureus NEGATIVE NEGATIVE Final    Comment: (NOTE) The Xpert SA Assay (FDA approved for NASAL specimens in patients 75 years of age and older), is one component of a comprehensive surveillance program. It is not intended to diagnose infection nor to guide or monitor treatment.      Radiology Studies: Ct Head Wo Contrast  Result Date: 02/18/2017 CLINICAL DATA:  Altered mental status EXAM: CT HEAD WITHOUT CONTRAST TECHNIQUE: Contiguous axial images were obtained from the base of the skull through the vertex without intravenous contrast. COMPARISON:  None. FINDINGS: Brain: No mass lesion, intraparenchymal hemorrhage or extra-axial collection. No evidence of acute cortical infarct. There is periventricular hypoattenuation compatible with chronic microvascular disease. Vascular: No  hyperdense vessel or unexpected calcification. Skull: Normal visualized skull base, calvarium and extracranial soft tissues. Sinuses/Orbits: No sinus fluid levels or advanced mucosal thickening. No mastoid effusion. Normal orbits. IMPRESSION: Chronic ischemic microangiopathy without acute abnormality. Electronically Signed   By: Deatra Robinson M.D.   On: 02/18/2017 18:31   Dg Chest Port 1 View  Result Date: 02/18/2017 CLINICAL DATA:  Acute encephalopathy. EXAM: PORTABLE CHEST 1 VIEW COMPARISON:  02/10/2017 FINDINGS: Sequelae of prior CABG are again identified. Right jugular catheter has been removed. Aortic atherosclerosis is noted. The cardiac silhouette is borderline to mildly enlarged. Lung volumes remain diminished with minimal bibasilar atelectasis. No lobar consolidation, edema, sizable pleural effusion, or pneumothorax is identified. No acute osseous abnormality is seen. IMPRESSION: Low lung volumes without evidence of acute airspace disease. Electronically Signed   By: Sebastian Ache M.D.   On: 02/18/2017 16:26    Scheduled Meds: . allopurinol  100 mg Oral QPM  . aspirin EC  81 mg Oral QPM  . calcitRIOL  0.25 mcg Oral Q M,W,F-HD  . cyanocobalamin  250 mcg Oral QPM  . [START ON 02/23/2017] darbepoetin (ARANESP) injection - DIALYSIS  60 mcg Intravenous Q Sat-HD  . feeding supplement (NEPRO CARB STEADY)  237 mL Oral BID BM  . Glycerin (Adult)  1 suppository Rectal Once  . heparin  5,000 Units Subcutaneous Q8H  . multivitamin  1 tablet Oral QHS  . povidone-iodine   Topical Daily  . sucroferric oxyhydroxide  1,000 mg Oral TID WC   Continuous Infusions:    LOS: 9 days   Haydee Salter, MD [facility]   If 7PM-7AM, please contact night-coverage [email or web address] Password [facility] 02/19/2017,  2:24 PM

## 2017-02-19 NOTE — Progress Notes (Signed)
[facility] Progress Note   Subjective:  Seen in room, drowsy this morning. Denies CP or dyspnea.  Objective Vitals:   02/18/17 1235 02/18/17 2155 02/19/17 0449 02/19/17 0951  BP: 132/60 (!) 128/46 (!) 141/47 (!) 145/50  Pulse: 75 78 75 75  Resp: Temp: 97.7 F (36.5 C) 97.8 F (36.6 C) 98.1 F (36.7 C) 98 F (36.7 C)  TempSrc: Oral Oral Oral Oral  SpO2: 98% 100% 100% 99%  Weight: 90.1 kg (198 lb 10.2 oz) 90.2 kg (198 lb 13.7 oz)    Height:       Physical Exam General: Well appearing man, NAD. Heart: RRR; no murmur Lungs: CTA anteriorly Extremities: R AKA, L ischemic toes (not examined today). No LLE edema. Dialysis Access: AVF + thrill/bruit  Additional Objective Labs: Basic Metabolic Panel:  Recent Labs Lab 02/17/17 0209 02/18/17 0220 02/19/17 0548  NA 133* 131* 131*  K 4.5 5.0 4.3  CL 93* 91* 93*  CO2 32 31 28  GLUCOSE 118* 116* 85  BUN 26* 38* 22*  CREATININE 7.47* 9.40* 6.43*  CALCIUM 9.4 9.4 9.4  PHOS 3.3 3.1 3.5   Liver Function Tests:  Recent Labs Lab 02/17/17 0209 02/18/17 0220 02/19/17 0548  ALBUMIN 2.3* 2.3* 2.3*   CBC:  Recent Labs Lab 02/15/17 0213 02/16/17 0339 02/17/17 0209 02/17/17 1834 02/18/17 0220 02/19/17 0548  WBC 9.8 7.7 7.8  --  7.9 6.7  NEUTROABS  --   --  5.8  --  6.2 5.0  HGB 8.4* 8.6* 8.2* 9.1* 7.7* 8.0*  HCT 26.3* 27.5* 25.8* 27.6* 24.7* 25.6*  MCV 94.6 94.8 93.5  --  93.2 93.4  PLT 250 253 264  --  257 267   CBG:  Recent Labs Lab 02/18/17 0758 02/18/17 1348 02/18/17 1649 02/18/17 2152 02/19/17 0814  GLUCAP 93 75 109* 114* 83   Studies/Results: Ct Head Wo Contrast  Result Date: 02/18/2017 CLINICAL DATA:  Altered mental status EXAM: CT HEAD WITHOUT CONTRAST TECHNIQUE: Contiguous axial images were obtained from the base of the skull through the vertex without intravenous contrast. COMPARISON:  None. FINDINGS: Brain: No mass lesion, intraparenchymal hemorrhage or extra-axial  collection. No evidence of acute cortical infarct. There is periventricular hypoattenuation compatible with chronic microvascular disease. Vascular: No hyperdense vessel or unexpected calcification. Skull: Normal visualized skull base, calvarium and extracranial soft tissues. Sinuses/Orbits: No sinus fluid levels or advanced mucosal thickening. No mastoid effusion. Normal orbits. IMPRESSION: Chronic ischemic microangiopathy without acute abnormality. Electronically Signed   By: Deatra Robinson M.D.   On: 02/18/2017 18:31   Dg Chest Port 1 View  Result Date: 02/18/2017 CLINICAL DATA:  Acute encephalopathy. EXAM: PORTABLE CHEST 1 VIEW COMPARISON:  02/10/2017 FINDINGS: Sequelae of prior CABG are again identified. Right jugular catheter has been removed. Aortic atherosclerosis is noted. The cardiac silhouette is borderline to mildly enlarged. Lung volumes remain diminished with minimal bibasilar atelectasis. No lobar consolidation, edema, sizable pleural effusion, or pneumothorax is identified. No acute osseous abnormality is seen. IMPRESSION: Low lung volumes without evidence of acute airspace disease. Electronically Signed   By: Sebastian Ache M.D.   On: 02/18/2017 16:26   Medications:  . allopurinol  100 mg Oral QPM  . aspirin EC  81 mg Oral QPM  . calcitRIOL  0.25 mcg Oral Q M,W,F-HD  . cyanocobalamin  250 mcg Oral QPM  . [START ON 02/23/2017] darbepoetin (ARANESP) injection - DIALYSIS  60 mcg Intravenous Q Sat-HD  . feeding supplement (  NEPRO CARB STEADY)  237 mL Oral BID BM  . Glycerin (Adult)  1 suppository Rectal Once  . heparin  5,000 Units Subcutaneous Q8H  . multivitamin  1 tablet Oral QHS  . povidone-iodine   Topical Daily  . sucroferric oxyhydroxide  1,000 mg Oral TID WC    Dialysis Orders: [facility] MWF 4h  2/2   100.5kg   Hep 7500   LFA AVG Mircera 50 q 2 wks - last 9/22, calcitriol 0.25 Parsabiv 5 Recent labs: hgb 10.8 29% sat iPTH 1780 Ca 9.8 P 6.8  Assessment/Plan: 1. Bilateral  infected and ischemic toes: S/p right AKA9/27. On Vanc/Ceftazidime. Per VVS note, left foot also ischemic and at risk of progressing. 2. ESRD: Continue HD per MWF schedule, next 10/3. No heparin. 3. Hypertension/volume: BP reasonable. EDW will need to be lowered at d/c s/p amputation. 4. Anemia: Hgb 8. Next ESA due 10/6. 5. Metabolic bone disease: Labs ok, Corr Ca slightly. Continue calcitriol and binders for now, may need to cut back on Calcitriol if rises further. Parsabiv not available at [facility]. 6. Nutrition: Alb low (2.3). Continue renal diet/multivitamin/Nepro. 7. Disp: Confused at times. Likely will need SNF. Palliative care consult pending.  Ozzie Hoyle, PA-C 02/19/2017, 11:42 AM  [facility] Pager: [phone]  Pt seen, examined and agree w A/P as above.  Vinson Moselle MD [employer] pager [phone]   02/19/2017, 1:41 PM

## 2017-02-19 NOTE — Progress Notes (Signed)
  Progress Note    02/19/2017 8:10 AM 5 Days Post-Op  Subjective:  No complaints  Vitals:   02/18/17 2155 02/19/17 0449  BP: (!) 128/46 (!) 141/47  Pulse: 78 75  Resp: 16 17  Temp: 97.8 F (36.6 C) 98.1 F (36.7 C)  SpO2: 100% 100%    Physical Exam: Awake and alert Right aka site cdi with staples Stable left 2-4 toe gangrene  CBC    Component Value Date/Time   WBC 6.7 02/19/2017 0548   RBC 2.74 (L) 02/19/2017 0548   HGB 8.0 (L) 02/19/2017 0548   HCT 25.6 (L) 02/19/2017 0548   PLT 267 02/19/2017 0548   MCV 93.4 02/19/2017 0548   MCH 29.2 02/19/2017 0548   MCHC 31.3 02/19/2017 0548   RDW 15.2 02/19/2017 0548   LYMPHSABS 0.8 02/19/2017 0548   MONOABS 0.6 02/19/2017 0548   EOSABS 0.2 02/19/2017 0548   BASOSABS 0.0 02/19/2017 0548    BMET    Component Value Date/Time   NA 131 (L) 02/19/2017 0548   K 4.3 02/19/2017 0548   CL 93 (L) 02/19/2017 0548   CO2 28 02/19/2017 0548   GLUCOSE 85 02/19/2017 0548   BUN 22 (H) 02/19/2017 0548   CREATININE 6.43 (H) 02/19/2017 0548   CALCIUM 9.4 02/19/2017 0548   CALCIUM 8.4 11/29/2007 1515   GFRNONAA 7 (L) 02/19/2017 0548   GFRAA 8 (L) 02/19/2017 0548    INR    Component Value Date/Time   INR 1.10 01/01/2017 1429     Intake/Output Summary (Last 24 hours) at 02/19/17 0810 Last data filed at 02/19/17 0601  Gross per 24 hour  Intake                0 ml  Output             1500 ml  Net            -1500 ml     Assessment:  81 y.o. male is s/p right aka for foot gangrene, has ischemic appearing toes on the left  Plan: Betadine paint to affected left toes  Discussed case with Dr. Melynda Ripple yesterday, only option on left would be above knee amputation if gangrene progresses to wet gangrene or causes illness.  Has f/u on 10.26 scheduled.    Aiya Keach C. Randie Heinz, MD [facility] [place] Office: [phone] Pager: [phone]  02/19/2017 8:10 AM

## 2017-02-19 NOTE — Progress Notes (Signed)
Palliative consult received.   Noted plan for discharge to SNF. If unable to schedule meeting while inpatient- recommend discharging physician request outpatient palliative consult at SNF in discharge instructions.  Called and spoke with patient's son. He is to arrange a time with family members and call back to scheduled GOC meeting hopefully before patient is discharged, otherwise, recommend outpatient palliative consult.   Ocie Bob, AGNP-C Palliative Medicine  Please call Palliative Medicine team phone with any questions [phone]. For individual providers please see AMION.

## 2017-02-19 NOTE — Care Management Note (Signed)
Case Management Note  Patient Details  Name: [name] MRN: [id number] Date of Birth: May 31, 1935  Subjective/Objective:      CM following for progression and d/c planning.               Action/Plan: 02/19/2017 Plan for d/c today to SNF, for ongoing rehab. CSW Raeanne Gathers working with this pt and will contact family for SNF choices per our discussion. No HH or DME needs at this time.   Expected Discharge Date:      02/19/2017            Expected Discharge Plan:  Skilled Nursing Facility  In-House Referral:  Clinical Social Work  Discharge planning Services  CM Consult  Post Acute Care Choice:    Choice offered to:  NA  DME Arranged:  N/A DME Agency:  NA  HH Arranged:  NA HH Agency:  NA  Status of Service:  Completed, signed off  If discussed at [employer] of Stay Meetings, dates discussed:    Additional Comments:  Starlyn Skeans, RN 02/19/2017, 11:30 AM

## 2017-02-19 NOTE — Care Management Important Message (Signed)
Important Message  Patient Details  Name: [name] MRN: [id number] Date of Birth: 01-25-1936   Medicare Important Message Given:  Yes    [place] Yarbro Abena 02/19/2017, 9:30 AM

## 2017-02-19 NOTE — Progress Notes (Signed)
Physical Therapy Treatment Patient Details Name: [name] MRN: [id number] DOB: 1935-10-20 Today's Date: 02/19/2017    History of Present Illness The patient is a 81 yo male with history of ESRD dialysis TTS, HTN, MI, PAD, DM2.  Patient was recently admitted and discharged last month with cellulitis of the RLE foot.  Due to gangrene he had follow up cath with Dr. Randie Heinz on 9/19.  They were unable to re-vascularize the R foot.  Pt is s/p R AKA on 9/27.     PT Comments    Continuing work on functional mobility and activity tolerance; Able to transfer OOB to recliner this morning with Lateral scoot technqiue and  2 person Max assist; required L knee blocking for safety/stability; Once in chair, required extra effort and more practice with transfers to get optimal seating position; continue to agree with SNF for post-acute rehab; Noted Palliative Care team consulted  Follow Up Recommendations  SNF     Equipment Recommendations  None recommended by PT    Recommendations for Other Services       Precautions / Restrictions Precautions Precautions: Fall Restrictions RLE Weight Bearing: Non weight bearing LLE Weight Bearing: Weight bearing as tolerated Other Position/Activity Restrictions: R AKA     Mobility  Bed Mobility Overal bed mobility: Needs Assistance Bed Mobility: Supine to Sit     Supine to sit: Mod assist;+2 for physical assistance;+2 for safety/equipment     General bed mobility comments: Cues for technqiue; slow moving, but making good effort to move independently; mod assist to elevate trunk to sit and square off hips at EOB; hand held assist to pull to sit  Transfers Overall transfer level: Needs assistance Equipment used: 2 person hand held assist (and bed pad use) Transfers: Squat Pivot Transfers;Lateral/Scoot Transfers     Squat pivot transfers: +2 physical assistance;Max assist    Lateral/Scoot Transfers: Max assist;+2 physical assistance General transfer  comment: Lateral scoot bed to chair performed with +2 max assist; then required a few bouts of modified squat pivot transfer once in recliner for optimal seating/positioning  Ambulation/Gait                 Stairs            Wheelchair Mobility    Modified Rankin (Stroke Patients Only)       Balance     Sitting balance-Leahy Scale: Fair Sitting balance - Comments: Periods of supervision, however, balance fluctuated as pt distracted with other tasks.                                     Cognition Arousal/Alertness: Awake/alert Behavior During Therapy: WFL for tasks assessed/performed;Flat affect Overall Cognitive Status: Impaired/Different from baseline Area of Impairment: Problem solving;Orientation                 Orientation Level: Disoriented to;Place;Time;Situation     Following Commands: Follows one step commands inconsistently;Follows one step commands with increased time Safety/Judgement: Decreased awareness of safety;Decreased awareness of deficits   Problem Solving: Slow processing;Decreased initiation        Exercises      General Comments        Pertinent Vitals/Pain Pain Assessment: Faces Faces Pain Scale: Hurts whole lot Pain Location: R residual limb with movement, and L foot with any weight bearing Pain Descriptors / Indicators: Grimacing Pain Intervention(s): Monitored during session;Repositioned    Home Living Family/patient  expects to be discharged to:: Unsure Living Arrangements: Spouse/significant other                  Prior Function            PT Goals (current goals can now be found in the care plan section) Acute Rehab PT Goals Patient Stated Goal: Did not state, but agreeable to getting OOB to recliner PT Goal Formulation: Patient unable to participate in goal setting Time For Goal Achievement: 03/02/17 Potential to Achieve Goals: Fair Progress towards PT goals: Progressing toward  goals    Frequency    Min 2X/week      PT Plan Current plan remains appropriate    Co-evaluation              AM-PAC PT "6 Clicks" Daily Activity  Outcome Measure  Difficulty turning over in bed (including adjusting bedclothes, sheets and blankets)?: A Lot Difficulty moving from lying on back to sitting on the side of the bed? : Unable Difficulty sitting down on and standing up from a chair with arms (e.g., wheelchair, bedside commode, etc,.)?: Unable Help needed moving to and from a bed to chair (including a wheelchair)?: A Lot Help needed walking in hospital room?: Total Help needed climbing 3-5 steps with a railing? : Total 6 Click Score: 8    End of Session Equipment Utilized During Treatment: Gait belt (bed pad) Activity Tolerance: Patient tolerated treatment well;Patient limited by pain Patient left: in chair;with call bell/phone within reach;with chair alarm set;Other (comment) (with Admission RN, Harriett Sine) Nurse Communication: Mobility status;Need for lift equipment PT Visit Diagnosis: Muscle weakness (generalized) (M62.81);Pain;Unsteadiness on feet (R26.81);Other abnormalities of gait and mobility (R26.89) Pain - Right/Left:  (Both) Pain - part of body: Leg     Time: 1610-9604 PT Time Calculation (min) (ACUTE ONLY): 24 min  Charges:  $Therapeutic Activity: 23-37 mins                    G Codes:       Van Clines, PT  Acute Rehabilitation Services Pager [phone] Office [phone]    [name] 02/19/2017, 10:03 AM

## 2017-02-19 NOTE — Progress Notes (Signed)
Clinical Social Worker facilitated patient discharge including contacting patient family and facility to confirm patient discharge plans.  Clinical information faxed to facility and family agreeable with plan.  CSW arranged ambulance transport via [facility] to [facility] .  RN to call [phone] (pt will go in rm#[phone]) for  report prior to discharge.  Clinical Social Worker will sign off for now as social work intervention is no longer needed. Please consult [place] again if new need arises.  Marrianne Mood, MSW, [employer] [phone]

## 2017-02-19 NOTE — Discharge Summary (Addendum)
Physician Discharge Summary  AVIGDOR DOLLAR ZOX:[id number] DOB: November 14, 1935 DOA: 02/10/2017  PCP: Crist Fat, MD  Admit date: 02/10/2017 Discharge date: 02/19/2017  Time spent: 45 minutes  Recommendations for Outpatient Follow-up:  1. F/u with Dr. Randie Heinz With vascular surgery as scheduled on October 26 2. D/C to [facility] SNF 3. (include homehealth, outpatient follow-up instructions, specific recommendations for PCP to follow-up on, etc.)   Discharge Diagnoses:  Principal Problem:   Gangrene of foot (HCC) Active Problems:   End stage renal disease (HCC)   Hypertension   Acute metabolic encephalopathy   Diabetes mellitus with complication (HCC)   Pressure injury of skin   Discharge Condition: Stable  Diet recommendation: Renal/DM  Filed Weights   02/18/17 0810 02/18/17 1235 02/18/17 2155  Weight: 91.9 kg (202 lb 9.6 oz) 90.1 kg (198 lb 10.2 oz) 90.2 kg (198 lb 13.7 oz)    History of present illness:  [name] is a 81 y.o. male with medical history significant of ESRD dialysis TTS, HTN, MI, PAD, DM2.  Patient was recently admitted and discharged last month with cellulitis of the RLE foot.  Due to gangrene he had follow up cath with Dr. Randie Heinz on 9/19.  They were unable to re-vascularize the R foot.  R AKA was recommended.  Patient was supposed to go to [facility] prior to this but he refused to go.  In the last couple of days, patient has been having increasing weakness and lethargy.  Today after getting home he had increasing lethargy and confusion.  Patient brought in to [facility] ED by family.   Hospital Course:  Patient admitted to the hospital for gangrenous lower extremities. Orthopedics of vascular surgery work consult. Plan was for AKA on the right. Patient started on IV antibiotics. Patient's antibiotics were stopped after amputation was complete. Dialysis without any events. Eating and drinking well. Patient stump healing well. Palliative care consult attempted but  family unreachable. Will have outpatient consult done.  Patient's mental status did not improve during hospital stay after amputation and antibiotics. CT scan of the head was negative. This is likely a new baseline for patient. Patient surgeon states that he seemed him in outpatient setting and the decline in the patient has been gradual.   Procedures:  Right above-knee amputation for right foot gangrene, 02/14/2017-Dr. Randie Heinz  Consultations:  Nephro, Vasc Surg, WOC  Discharge Exam: Vitals:   02/19/17 0449 02/19/17 0951  BP: (!) 141/47 (!) 145/50  Pulse: 75 75  Resp: 17 18  Temp: 98.1 F (36.7 C) 98 F (36.7 C)  SpO2: 100% 99%    General exam: Appears calm and comfortable; A&Ox2 (fluctuates) Respiratory system: Clear to auscultation. Respiratory effort normal. Cardiovascular system: S1 & S2 heard, RRR. No JVD, murmurs, rubs, gallops or clicks. No pedal edema. Gastrointestinal system: Abdomen is nondistended, soft and nontender. No organomegaly or masses felt. Normal bowel sounds heard. Central nervous system: Alert and oriented. No focal neurological deficits. Extremities:   R aka  Discharge Instructions    Current Discharge Medication List    START taking these medications   Details  calcitRIOL (ROCALTROL) 0.25 MCG capsule Take 1 capsule (0.25 mcg total) by mouth every Monday, Wednesday, and Friday with hemodialysis. Qty: 20 capsule, Refills: 0    Glycerin, Adult, 2.1 g SUPP Place 1 suppository rectally daily as needed for moderate constipation. Qty: 20 suppository, Refills: 0    multivitamin (RENA-VIT) TABS tablet Take 1 tablet by mouth at bedtime. Qty: 20 tablet, Refills:  0    Nutritional Supplements (FEEDING SUPPLEMENT, NEPRO CARB STEADY,) LIQD Take 237 mLs by mouth 2 (two) times daily between meals. Qty: 9480 mL, Refills: 0    povidone-iodine (BETADINE) 10 % external solution Apply topically daily. Qty: 480 mL, Refills: 0      CONTINUE these medications  which have NOT CHANGED   Details  allopurinol (ZYLOPRIM) 100 MG tablet Take 100 mg by mouth every evening.     aspirin EC 81 MG tablet Take 81 mg by mouth every evening.     Aspirin-Salicylamide-Caffeine (BC HEADACHE POWDER PO) Take 2 Packages by mouth as needed (headache).    oxyCODONE-acetaminophen (PERCOCET/ROXICET) 5-325 MG tablet Take 1 tablet by mouth every 6 (six) hours as needed for pain.    vitamin B-12 (CYANOCOBALAMIN) 250 MCG tablet Take 250 mcg by mouth every evening.       STOP taking these medications     sucroferric oxyhydroxide (VELPHORO) 500 MG chewable tablet        Allergies  Allergen Reactions  . Iohexol Swelling and Rash  . Zanaflex [Tizanidine] Other (See Comments)    Shut kidneys down Unable to walk or void  . Ezetimibe-Simvastatin Other (See Comments)    Elevated liver enzymes  . Fluvastatin Other (See Comments)    Elevated liver enzymes  . Simvastatin Other (See Comments)    Elevated liver enzymes   Follow-up Information    Maeola Harman, MD In 4 weeks.   Specialties:  Vascular Surgery, Cardiology Why:  Office will call you to arrange your appt (sent) Contact information: [address] [place] [address] [phone]            The results of significant diagnostics from this hospitalization (including imaging, microbiology, ancillary and laboratory) are listed below for reference.    Significant Diagnostic Studies: Ct Head Wo Contrast  Result Date: 02/18/2017 CLINICAL DATA:  Altered mental status EXAM: CT HEAD WITHOUT CONTRAST TECHNIQUE: Contiguous axial images were obtained from the base of the skull through the vertex without intravenous contrast. COMPARISON:  None. FINDINGS: Brain: No mass lesion, intraparenchymal hemorrhage or extra-axial collection. No evidence of acute cortical infarct. There is periventricular hypoattenuation compatible with chronic microvascular disease. Vascular: No hyperdense vessel or unexpected  calcification. Skull: Normal visualized skull base, calvarium and extracranial soft tissues. Sinuses/Orbits: No sinus fluid levels or advanced mucosal thickening. No mastoid effusion. Normal orbits. IMPRESSION: Chronic ischemic microangiopathy without acute abnormality. Electronically Signed   By: Deatra Robinson M.D.   On: 02/18/2017 18:31   Dg Chest Port 1 View  Result Date: 02/18/2017 CLINICAL DATA:  Acute encephalopathy. EXAM: PORTABLE CHEST 1 VIEW COMPARISON:  02/10/2017 FINDINGS: Sequelae of prior CABG are again identified. Right jugular catheter has been removed. Aortic atherosclerosis is noted. The cardiac silhouette is borderline to mildly enlarged. Lung volumes remain diminished with minimal bibasilar atelectasis. No lobar consolidation, edema, sizable pleural effusion, or pneumothorax is identified. No acute osseous abnormality is seen. IMPRESSION: Low lung volumes without evidence of acute airspace disease. Electronically Signed   By: Sebastian Ache M.D.   On: 02/18/2017 16:26   Dg Chest Port 1 View  Result Date: 02/10/2017 CLINICAL DATA:  Right PICC placement EXAM: PORTABLE CHEST 1 VIEW COMPARISON:  02/09/2017 FINDINGS: Right-sided central venous catheter tip has been straightened, it probably projects over the distal jugular confluence allowing for patient rotation. Post sternotomy changes. Linear atelectasis at the left lung base. Stable cardiomediastinal silhouette. IMPRESSION: Right-sided central venous catheter tip has been straightened, tip suspected  to project over distal jugular venous confluence allowing for markedly rotated patient. No change in cardiomegaly and bibasilar atelectasis Electronically Signed   By: Jasmine Pang M.D.   On: 02/10/2017 03:39   Dg Foot Complete Left  Result Date: 01/25/2017 CLINICAL DATA:  Open wound, progressed over the past 3 months. EXAM: LEFT FOOT - COMPLETE 3+ VIEW COMPARISON:  None. FINDINGS: Osseous alignment is normal. Diffuse osteopenia limits  characterization of osseous detail, however, there is no fracture line or displaced fracture fragment seen. No destructive changes to suggest osteomyelitis. Extensive vascular calcifications are seen throughout the soft tissues of the left foot. Soft tissues about the left foot are otherwise unremarkable. No foreign body or soft tissue gas appreciated. IMPRESSION: 1. No acute findings. Diffuse osteopenia limits characterization of osseous detail but no fracture is seen and no obvious destructive change to suggest osteomyelitis. 2. Extensive atherosclerosis throughout the soft tissues of the left foot. Electronically Signed   By: Bary Richard M.D.   On: 01/25/2017 14:52   Dg Foot Complete Right  Result Date: 01/25/2017 CLINICAL DATA:  Open wound across toes of both feet, progressed over the past few months. EXAM: RIGHT FOOT COMPLETE - 3+ VIEW COMPARISON:  None. FINDINGS: Diffuse osteopenia which limits characterization of osseous detail but there is no fracture line or displaced fracture fragment identified. No destructive change to suggest osteomyelitis. On the lateral projection, there is a 7 mm linear foreign body within the superficial soft tissues underlying the midfoot, possibly chronic calcification. Also extensive vascular calcifications are seen throughout the soft tissues of the right foot. Superficial soft tissues are otherwise unremarkable. No soft tissue gas seen. IMPRESSION: 1. No acute appearing osseous abnormality, although diffuse osteopenia limits characterization of osseous detail. No fracture line or displaced fracture fragment seen. No obvious destructive change to suggest osteomyelitis. 2. Small linear 7 mm foreign body within the superficial soft tissues underlying the midfoot, of uncertain etiology but of calcific density and presumably related to the plantar fibroma described on earlier CT report of 12/19/2016. 3. Extensive atherosclerosis. Electronically Signed   By: Bary Richard M.D.    On: 01/25/2017 14:50    Microbiology: Recent Results (from the past 240 hour(s))  MRSA PCR Screening     Status: Abnormal   Collection Time: 02/10/17  3:33 AM  Result Value Ref Range Status   MRSA by PCR POSITIVE (A) NEGATIVE Final    Comment:        The GeneXpert MRSA Assay (FDA approved for NASAL specimens only), is one component of a comprehensive MRSA colonization surveillance program. It is not intended to diagnose MRSA infection nor to guide or monitor treatment for MRSA infections. RESULT CALLED TO, READ BACK BY AND VERIFIED WITH: IRBY,T RN [phone] AT [phone] [facility]   Surgical pcr screen     Status: None   Collection Time: 02/12/17  5:09 AM  Result Value Ref Range Status   MRSA, PCR NEGATIVE NEGATIVE Final   Staphylococcus aureus NEGATIVE NEGATIVE Final    Comment: (NOTE) The Xpert SA Assay (FDA approved for NASAL specimens in patients 50 years of age and older), is one component of a comprehensive surveillance program. It is not intended to diagnose infection nor to guide or monitor treatment.      Labs: Basic Metabolic Panel:  Recent Labs Lab 02/13/17 0710  02/14/17 2115 02/15/17 0213 02/16/17 0339 02/17/17 0209 02/18/17 0220 02/19/17 0548  NA 135  < > 136 135 135 133* 131* 131*  K 4.0  < >  4.4 4.4 4.4 4.5 5.0 4.3  CL 97*  < > 96* 95* 94* 93* 91* 93*  CO2 28  < > 31 30 32 32 31 28  GLUCOSE 86  < > 134* 165* 111* 118* 116* 85  BUN 29*  < > 19 23* 14 26* 38* 22*  CREATININE 10.15*  < > 7.46* 7.82* 5.57* 7.47* 9.40* 6.43*  CALCIUM 8.8*  < > 9.1 9.0 9.5 9.4 9.4 9.4  PHOS 4.9*  --  4.3  --   --  3.3 3.1 3.5  < > = values in this interval not displayed. Liver Function Tests:  Recent Labs Lab 02/13/17 0710 02/14/17 2115 02/17/17 0209 02/18/17 0220 02/19/17 0548  ALBUMIN 2.5* 2.8* 2.3* 2.3* 2.3*   No results for input(s): LIPASE, AMYLASE in the last 168 hours. No results for input(s): AMMONIA in the last 168 hours. CBC:  Recent Labs Lab  02/15/17 0213 02/16/17 0339 02/17/17 0209 02/17/17 1834 02/18/17 0220 02/19/17 0548  WBC 9.8 7.7 7.8  --  7.9 6.7  NEUTROABS  --   --  5.8  --  6.2 5.0  HGB 8.4* 8.6* 8.2* 9.1* 7.7* 8.0*  HCT 26.3* 27.5* 25.8* 27.6* 24.7* 25.6*  MCV 94.6 94.8 93.5  --  93.2 93.4  PLT 250 253 264  --  257 267   Cardiac Enzymes: No results for input(s): CKTOTAL, CKMB, CKMBINDEX, TROPONINI in the last 168 hours. BNP: BNP (last 3 results)  Recent Labs  12/23/16 2004  BNP 371.8*    ProBNP (last 3 results) No results for input(s): PROBNP in the last 8760 hours.  CBG:  Recent Labs Lab 02/18/17 1348 02/18/17 1649 02/18/17 2152 02/19/17 0814 02/19/17 1144  GLUCAP 75 109* 114* 83 120*       Signed:  Haydee Salter MD  FACP  [facility] 02/19/2017, 3:43 PM

## 2017-02-19 NOTE — Discharge Summary (Signed)
Pt given discharge instructions. Report called to Shanda Bumps, Charity fundraiser at [facility]. Denies questions. Ambulance to take pt.

## 2017-02-19 NOTE — Plan of Care (Signed)
Problem: Education: Goal: Ability to verbalize activity precautions or restrictions will improve Outcome: Progressing Will continue to reinforce new precautions to take with right AKA. Will encourage patient to sit up.

## 2017-02-20 DIAGNOSIS — D689 Coagulation defect, unspecified: Secondary | ICD-10-CM | POA: Diagnosis not present

## 2017-02-20 DIAGNOSIS — Z23 Encounter for immunization: Secondary | ICD-10-CM | POA: Diagnosis not present

## 2017-02-20 DIAGNOSIS — E11621 Type 2 diabetes mellitus with foot ulcer: Secondary | ICD-10-CM | POA: Diagnosis not present

## 2017-02-20 DIAGNOSIS — R52 Pain, unspecified: Secondary | ICD-10-CM | POA: Diagnosis not present

## 2017-02-20 DIAGNOSIS — N186 End stage renal disease: Secondary | ICD-10-CM | POA: Diagnosis not present

## 2017-02-20 DIAGNOSIS — N2581 Secondary hyperparathyroidism of renal origin: Secondary | ICD-10-CM | POA: Diagnosis not present

## 2017-02-20 DIAGNOSIS — D631 Anemia in chronic kidney disease: Secondary | ICD-10-CM | POA: Diagnosis not present

## 2017-02-20 DIAGNOSIS — D509 Iron deficiency anemia, unspecified: Secondary | ICD-10-CM | POA: Diagnosis not present

## 2017-02-20 DIAGNOSIS — E1129 Type 2 diabetes mellitus with other diabetic kidney complication: Secondary | ICD-10-CM | POA: Diagnosis not present

## 2017-02-21 DIAGNOSIS — I739 Peripheral vascular disease, unspecified: Secondary | ICD-10-CM | POA: Diagnosis not present

## 2017-02-22 DIAGNOSIS — R52 Pain, unspecified: Secondary | ICD-10-CM | POA: Diagnosis not present

## 2017-02-22 DIAGNOSIS — N186 End stage renal disease: Secondary | ICD-10-CM | POA: Diagnosis not present

## 2017-02-22 DIAGNOSIS — E11621 Type 2 diabetes mellitus with foot ulcer: Secondary | ICD-10-CM | POA: Diagnosis not present

## 2017-02-22 DIAGNOSIS — E1129 Type 2 diabetes mellitus with other diabetic kidney complication: Secondary | ICD-10-CM | POA: Diagnosis not present

## 2017-02-22 DIAGNOSIS — D631 Anemia in chronic kidney disease: Secondary | ICD-10-CM | POA: Diagnosis not present

## 2017-02-22 DIAGNOSIS — D689 Coagulation defect, unspecified: Secondary | ICD-10-CM | POA: Diagnosis not present

## 2017-02-22 DIAGNOSIS — N2581 Secondary hyperparathyroidism of renal origin: Secondary | ICD-10-CM | POA: Diagnosis not present

## 2017-02-22 DIAGNOSIS — Z23 Encounter for immunization: Secondary | ICD-10-CM | POA: Diagnosis not present

## 2017-02-22 DIAGNOSIS — D509 Iron deficiency anemia, unspecified: Secondary | ICD-10-CM | POA: Diagnosis not present

## 2017-02-25 DIAGNOSIS — N186 End stage renal disease: Secondary | ICD-10-CM | POA: Diagnosis not present

## 2017-02-25 DIAGNOSIS — D631 Anemia in chronic kidney disease: Secondary | ICD-10-CM | POA: Diagnosis not present

## 2017-02-25 DIAGNOSIS — E11621 Type 2 diabetes mellitus with foot ulcer: Secondary | ICD-10-CM | POA: Diagnosis not present

## 2017-02-25 DIAGNOSIS — N2581 Secondary hyperparathyroidism of renal origin: Secondary | ICD-10-CM | POA: Diagnosis not present

## 2017-02-25 DIAGNOSIS — D689 Coagulation defect, unspecified: Secondary | ICD-10-CM | POA: Diagnosis not present

## 2017-02-25 DIAGNOSIS — R52 Pain, unspecified: Secondary | ICD-10-CM | POA: Diagnosis not present

## 2017-02-25 DIAGNOSIS — E1129 Type 2 diabetes mellitus with other diabetic kidney complication: Secondary | ICD-10-CM | POA: Diagnosis not present

## 2017-02-25 DIAGNOSIS — Z23 Encounter for immunization: Secondary | ICD-10-CM | POA: Diagnosis not present

## 2017-02-25 DIAGNOSIS — D509 Iron deficiency anemia, unspecified: Secondary | ICD-10-CM | POA: Diagnosis not present

## 2017-02-27 DIAGNOSIS — R52 Pain, unspecified: Secondary | ICD-10-CM | POA: Diagnosis not present

## 2017-02-27 DIAGNOSIS — E1129 Type 2 diabetes mellitus with other diabetic kidney complication: Secondary | ICD-10-CM | POA: Diagnosis not present

## 2017-02-27 DIAGNOSIS — D631 Anemia in chronic kidney disease: Secondary | ICD-10-CM | POA: Diagnosis not present

## 2017-02-27 DIAGNOSIS — Z23 Encounter for immunization: Secondary | ICD-10-CM | POA: Diagnosis not present

## 2017-02-27 DIAGNOSIS — N186 End stage renal disease: Secondary | ICD-10-CM | POA: Diagnosis not present

## 2017-02-27 DIAGNOSIS — N2581 Secondary hyperparathyroidism of renal origin: Secondary | ICD-10-CM | POA: Diagnosis not present

## 2017-02-27 DIAGNOSIS — E11621 Type 2 diabetes mellitus with foot ulcer: Secondary | ICD-10-CM | POA: Diagnosis not present

## 2017-02-27 DIAGNOSIS — D509 Iron deficiency anemia, unspecified: Secondary | ICD-10-CM | POA: Diagnosis not present

## 2017-02-27 DIAGNOSIS — D689 Coagulation defect, unspecified: Secondary | ICD-10-CM | POA: Diagnosis not present

## 2017-03-01 DIAGNOSIS — D509 Iron deficiency anemia, unspecified: Secondary | ICD-10-CM | POA: Diagnosis not present

## 2017-03-01 DIAGNOSIS — Z23 Encounter for immunization: Secondary | ICD-10-CM | POA: Diagnosis not present

## 2017-03-01 DIAGNOSIS — E1129 Type 2 diabetes mellitus with other diabetic kidney complication: Secondary | ICD-10-CM | POA: Diagnosis not present

## 2017-03-01 DIAGNOSIS — E11621 Type 2 diabetes mellitus with foot ulcer: Secondary | ICD-10-CM | POA: Diagnosis not present

## 2017-03-01 DIAGNOSIS — N2581 Secondary hyperparathyroidism of renal origin: Secondary | ICD-10-CM | POA: Diagnosis not present

## 2017-03-01 DIAGNOSIS — D631 Anemia in chronic kidney disease: Secondary | ICD-10-CM | POA: Diagnosis not present

## 2017-03-01 DIAGNOSIS — D689 Coagulation defect, unspecified: Secondary | ICD-10-CM | POA: Diagnosis not present

## 2017-03-01 DIAGNOSIS — R52 Pain, unspecified: Secondary | ICD-10-CM | POA: Diagnosis not present

## 2017-03-01 DIAGNOSIS — N186 End stage renal disease: Secondary | ICD-10-CM | POA: Diagnosis not present

## 2017-03-04 DIAGNOSIS — D509 Iron deficiency anemia, unspecified: Secondary | ICD-10-CM | POA: Diagnosis not present

## 2017-03-04 DIAGNOSIS — D689 Coagulation defect, unspecified: Secondary | ICD-10-CM | POA: Diagnosis not present

## 2017-03-04 DIAGNOSIS — E1129 Type 2 diabetes mellitus with other diabetic kidney complication: Secondary | ICD-10-CM | POA: Diagnosis not present

## 2017-03-04 DIAGNOSIS — N186 End stage renal disease: Secondary | ICD-10-CM | POA: Diagnosis not present

## 2017-03-04 DIAGNOSIS — Z23 Encounter for immunization: Secondary | ICD-10-CM | POA: Diagnosis not present

## 2017-03-04 DIAGNOSIS — N2581 Secondary hyperparathyroidism of renal origin: Secondary | ICD-10-CM | POA: Diagnosis not present

## 2017-03-04 DIAGNOSIS — D631 Anemia in chronic kidney disease: Secondary | ICD-10-CM | POA: Diagnosis not present

## 2017-03-04 DIAGNOSIS — E11621 Type 2 diabetes mellitus with foot ulcer: Secondary | ICD-10-CM | POA: Diagnosis not present

## 2017-03-04 DIAGNOSIS — R52 Pain, unspecified: Secondary | ICD-10-CM | POA: Diagnosis not present

## 2017-03-06 DIAGNOSIS — N2581 Secondary hyperparathyroidism of renal origin: Secondary | ICD-10-CM | POA: Diagnosis not present

## 2017-03-06 DIAGNOSIS — R52 Pain, unspecified: Secondary | ICD-10-CM | POA: Diagnosis not present

## 2017-03-06 DIAGNOSIS — E1129 Type 2 diabetes mellitus with other diabetic kidney complication: Secondary | ICD-10-CM | POA: Diagnosis not present

## 2017-03-06 DIAGNOSIS — Z23 Encounter for immunization: Secondary | ICD-10-CM | POA: Diagnosis not present

## 2017-03-06 DIAGNOSIS — N186 End stage renal disease: Secondary | ICD-10-CM | POA: Diagnosis not present

## 2017-03-06 DIAGNOSIS — D689 Coagulation defect, unspecified: Secondary | ICD-10-CM | POA: Diagnosis not present

## 2017-03-06 DIAGNOSIS — D509 Iron deficiency anemia, unspecified: Secondary | ICD-10-CM | POA: Diagnosis not present

## 2017-03-06 DIAGNOSIS — E11621 Type 2 diabetes mellitus with foot ulcer: Secondary | ICD-10-CM | POA: Diagnosis not present

## 2017-03-06 DIAGNOSIS — D631 Anemia in chronic kidney disease: Secondary | ICD-10-CM | POA: Diagnosis not present

## 2017-03-08 DIAGNOSIS — Z23 Encounter for immunization: Secondary | ICD-10-CM | POA: Diagnosis not present

## 2017-03-08 DIAGNOSIS — E1129 Type 2 diabetes mellitus with other diabetic kidney complication: Secondary | ICD-10-CM | POA: Diagnosis not present

## 2017-03-08 DIAGNOSIS — D631 Anemia in chronic kidney disease: Secondary | ICD-10-CM | POA: Diagnosis not present

## 2017-03-08 DIAGNOSIS — E11621 Type 2 diabetes mellitus with foot ulcer: Secondary | ICD-10-CM | POA: Diagnosis not present

## 2017-03-08 DIAGNOSIS — N186 End stage renal disease: Secondary | ICD-10-CM | POA: Diagnosis not present

## 2017-03-08 DIAGNOSIS — N2581 Secondary hyperparathyroidism of renal origin: Secondary | ICD-10-CM | POA: Diagnosis not present

## 2017-03-08 DIAGNOSIS — D689 Coagulation defect, unspecified: Secondary | ICD-10-CM | POA: Diagnosis not present

## 2017-03-08 DIAGNOSIS — R52 Pain, unspecified: Secondary | ICD-10-CM | POA: Diagnosis not present

## 2017-03-08 DIAGNOSIS — D509 Iron deficiency anemia, unspecified: Secondary | ICD-10-CM | POA: Diagnosis not present

## 2017-03-11 DIAGNOSIS — R52 Pain, unspecified: Secondary | ICD-10-CM | POA: Diagnosis not present

## 2017-03-11 DIAGNOSIS — N186 End stage renal disease: Secondary | ICD-10-CM | POA: Diagnosis not present

## 2017-03-11 DIAGNOSIS — D509 Iron deficiency anemia, unspecified: Secondary | ICD-10-CM | POA: Diagnosis not present

## 2017-03-11 DIAGNOSIS — D689 Coagulation defect, unspecified: Secondary | ICD-10-CM | POA: Diagnosis not present

## 2017-03-11 DIAGNOSIS — E11621 Type 2 diabetes mellitus with foot ulcer: Secondary | ICD-10-CM | POA: Diagnosis not present

## 2017-03-11 DIAGNOSIS — Z23 Encounter for immunization: Secondary | ICD-10-CM | POA: Diagnosis not present

## 2017-03-11 DIAGNOSIS — N2581 Secondary hyperparathyroidism of renal origin: Secondary | ICD-10-CM | POA: Diagnosis not present

## 2017-03-11 DIAGNOSIS — D631 Anemia in chronic kidney disease: Secondary | ICD-10-CM | POA: Diagnosis not present

## 2017-03-11 DIAGNOSIS — E1129 Type 2 diabetes mellitus with other diabetic kidney complication: Secondary | ICD-10-CM | POA: Diagnosis not present

## 2017-03-13 DIAGNOSIS — D631 Anemia in chronic kidney disease: Secondary | ICD-10-CM | POA: Diagnosis not present

## 2017-03-13 DIAGNOSIS — D509 Iron deficiency anemia, unspecified: Secondary | ICD-10-CM | POA: Diagnosis not present

## 2017-03-13 DIAGNOSIS — E1129 Type 2 diabetes mellitus with other diabetic kidney complication: Secondary | ICD-10-CM | POA: Diagnosis not present

## 2017-03-13 DIAGNOSIS — N2581 Secondary hyperparathyroidism of renal origin: Secondary | ICD-10-CM | POA: Diagnosis not present

## 2017-03-13 DIAGNOSIS — R52 Pain, unspecified: Secondary | ICD-10-CM | POA: Diagnosis not present

## 2017-03-13 DIAGNOSIS — E11621 Type 2 diabetes mellitus with foot ulcer: Secondary | ICD-10-CM | POA: Diagnosis not present

## 2017-03-13 DIAGNOSIS — D689 Coagulation defect, unspecified: Secondary | ICD-10-CM | POA: Diagnosis not present

## 2017-03-13 DIAGNOSIS — Z23 Encounter for immunization: Secondary | ICD-10-CM | POA: Diagnosis not present

## 2017-03-13 DIAGNOSIS — N186 End stage renal disease: Secondary | ICD-10-CM | POA: Diagnosis not present

## 2017-03-14 DIAGNOSIS — N186 End stage renal disease: Secondary | ICD-10-CM | POA: Diagnosis not present

## 2017-03-14 DIAGNOSIS — T82858A Stenosis of vascular prosthetic devices, implants and grafts, initial encounter: Secondary | ICD-10-CM | POA: Diagnosis not present

## 2017-03-15 ENCOUNTER — Ambulatory Visit (INDEPENDENT_AMBULATORY_CARE_PROVIDER_SITE_OTHER): Payer: Self-pay | Admitting: Vascular Surgery

## 2017-03-15 ENCOUNTER — Encounter: Payer: Medicare Other | Admitting: Vascular Surgery

## 2017-03-15 ENCOUNTER — Encounter: Payer: Self-pay | Admitting: Vascular Surgery

## 2017-03-15 VITALS — BP 126/60 | HR 91 | Temp 97.9°F | Resp 20 | Ht 70.0 in | Wt 198.0 lb

## 2017-03-15 DIAGNOSIS — Z992 Dependence on renal dialysis: Secondary | ICD-10-CM

## 2017-03-15 DIAGNOSIS — I739 Peripheral vascular disease, unspecified: Secondary | ICD-10-CM

## 2017-03-15 DIAGNOSIS — N186 End stage renal disease: Secondary | ICD-10-CM

## 2017-03-15 NOTE — Progress Notes (Signed)
Subjective:     Patient ID: [name], male   DOB: 01/29/1936, 81 y.o.   MRN: [id number]  HPI 81 year old male follows up for recent right above-knee amputation.  [place] now [place].  He is healed his wound findings here for staple removal today.  He continues to have dry necrosis of 3 of his left-sided toes for which it appears they are placing Silvadene.  States he is not having fevers but does have quite a bit of pain at the level of the foot on the left.  He does not want left above-knee amputation which I have offered as his only option.   Review of Systems Left foot pain    Objective:   Physical Exam Awake and alert Right aka site cdi with staples        Assessment/plan     81 year old male here for follow-up right above-knee amputation which is healed well we will get his staples out today.  He continues to insist on not having a left above-knee irritation and I think this is reasonable given that he has no signs of infection of the foot but does have dry gangrene.  Above-knee amputation would be if he progressed with infection or pain was too severe to tolerate.  I have recommended Betadine painting to the foot and he can follow-up on a as needed basis.  Brandon C. Randie Heinzain, MD [facility] [place] Office: [phone] Pager: [phone]

## 2017-03-18 DIAGNOSIS — D631 Anemia in chronic kidney disease: Secondary | ICD-10-CM | POA: Diagnosis not present

## 2017-03-18 DIAGNOSIS — E1129 Type 2 diabetes mellitus with other diabetic kidney complication: Secondary | ICD-10-CM | POA: Diagnosis not present

## 2017-03-18 DIAGNOSIS — N186 End stage renal disease: Secondary | ICD-10-CM | POA: Diagnosis not present

## 2017-03-18 DIAGNOSIS — D689 Coagulation defect, unspecified: Secondary | ICD-10-CM | POA: Diagnosis not present

## 2017-03-18 DIAGNOSIS — D509 Iron deficiency anemia, unspecified: Secondary | ICD-10-CM | POA: Diagnosis not present

## 2017-03-18 DIAGNOSIS — Z23 Encounter for immunization: Secondary | ICD-10-CM | POA: Diagnosis not present

## 2017-03-18 DIAGNOSIS — N2581 Secondary hyperparathyroidism of renal origin: Secondary | ICD-10-CM | POA: Diagnosis not present

## 2017-03-18 DIAGNOSIS — E11621 Type 2 diabetes mellitus with foot ulcer: Secondary | ICD-10-CM | POA: Diagnosis not present

## 2017-03-18 DIAGNOSIS — R52 Pain, unspecified: Secondary | ICD-10-CM | POA: Diagnosis not present

## 2017-03-20 DIAGNOSIS — D631 Anemia in chronic kidney disease: Secondary | ICD-10-CM | POA: Diagnosis not present

## 2017-03-20 DIAGNOSIS — N2581 Secondary hyperparathyroidism of renal origin: Secondary | ICD-10-CM | POA: Diagnosis not present

## 2017-03-20 DIAGNOSIS — Z992 Dependence on renal dialysis: Secondary | ICD-10-CM | POA: Diagnosis not present

## 2017-03-20 DIAGNOSIS — E11621 Type 2 diabetes mellitus with foot ulcer: Secondary | ICD-10-CM | POA: Diagnosis not present

## 2017-03-20 DIAGNOSIS — D509 Iron deficiency anemia, unspecified: Secondary | ICD-10-CM | POA: Diagnosis not present

## 2017-03-20 DIAGNOSIS — R52 Pain, unspecified: Secondary | ICD-10-CM | POA: Diagnosis not present

## 2017-03-20 DIAGNOSIS — E1129 Type 2 diabetes mellitus with other diabetic kidney complication: Secondary | ICD-10-CM | POA: Diagnosis not present

## 2017-03-20 DIAGNOSIS — Z23 Encounter for immunization: Secondary | ICD-10-CM | POA: Diagnosis not present

## 2017-03-20 DIAGNOSIS — N186 End stage renal disease: Secondary | ICD-10-CM | POA: Diagnosis not present

## 2017-03-20 DIAGNOSIS — D689 Coagulation defect, unspecified: Secondary | ICD-10-CM | POA: Diagnosis not present

## 2017-03-22 DIAGNOSIS — D689 Coagulation defect, unspecified: Secondary | ICD-10-CM | POA: Diagnosis not present

## 2017-03-22 DIAGNOSIS — E1129 Type 2 diabetes mellitus with other diabetic kidney complication: Secondary | ICD-10-CM | POA: Diagnosis not present

## 2017-03-22 DIAGNOSIS — N186 End stage renal disease: Secondary | ICD-10-CM | POA: Diagnosis not present

## 2017-03-22 DIAGNOSIS — N2581 Secondary hyperparathyroidism of renal origin: Secondary | ICD-10-CM | POA: Diagnosis not present

## 2017-03-31 DIAGNOSIS — I739 Peripheral vascular disease, unspecified: Secondary | ICD-10-CM | POA: Diagnosis not present

## 2017-03-31 DIAGNOSIS — N186 End stage renal disease: Secondary | ICD-10-CM | POA: Diagnosis not present

## 2017-03-31 DIAGNOSIS — I2510 Atherosclerotic heart disease of native coronary artery without angina pectoris: Secondary | ICD-10-CM | POA: Diagnosis not present

## 2017-03-31 DIAGNOSIS — I2584 Coronary atherosclerosis due to calcified coronary lesion: Secondary | ICD-10-CM | POA: Diagnosis not present

## 2017-04-01 ENCOUNTER — Emergency Department (HOSPITAL_COMMUNITY): Payer: Medicare Other

## 2017-04-01 ENCOUNTER — Inpatient Hospital Stay (HOSPITAL_COMMUNITY)
Admission: EM | Admit: 2017-04-01 | Discharge: 2017-04-11 | DRG: 239 | Disposition: A | Discharge status: Other Acute Inpatient Hospital | Payer: Medicare Other | Attending: Family Medicine | Admitting: Family Medicine

## 2017-04-01 ENCOUNTER — Encounter (HOSPITAL_COMMUNITY): Payer: Self-pay

## 2017-04-01 DIAGNOSIS — Z6823 Body mass index (BMI) 23.0-23.9, adult: Secondary | ICD-10-CM

## 2017-04-01 DIAGNOSIS — X58XXXA Exposure to other specified factors, initial encounter: Secondary | ICD-10-CM | POA: Diagnosis present

## 2017-04-01 DIAGNOSIS — Z91041 Radiographic dye allergy status: Secondary | ICD-10-CM

## 2017-04-01 DIAGNOSIS — I509 Heart failure, unspecified: Secondary | ICD-10-CM | POA: Diagnosis present

## 2017-04-01 DIAGNOSIS — E1152 Type 2 diabetes mellitus with diabetic peripheral angiopathy with gangrene: Principal | ICD-10-CM | POA: Diagnosis present

## 2017-04-01 DIAGNOSIS — I132 Hypertensive heart and chronic kidney disease with heart failure and with stage 5 chronic kidney disease, or end stage renal disease: Secondary | ICD-10-CM | POA: Diagnosis not present

## 2017-04-01 DIAGNOSIS — Z7189 Other specified counseling: Secondary | ICD-10-CM | POA: Diagnosis not present

## 2017-04-01 DIAGNOSIS — I96 Gangrene, not elsewhere classified: Secondary | ICD-10-CM | POA: Diagnosis present

## 2017-04-01 DIAGNOSIS — I70262 Atherosclerosis of native arteries of extremities with gangrene, left leg: Secondary | ICD-10-CM | POA: Diagnosis not present

## 2017-04-01 DIAGNOSIS — Z951 Presence of aortocoronary bypass graft: Secondary | ICD-10-CM

## 2017-04-01 DIAGNOSIS — N2581 Secondary hyperparathyroidism of renal origin: Secondary | ICD-10-CM | POA: Diagnosis present

## 2017-04-01 DIAGNOSIS — K5900 Constipation, unspecified: Secondary | ICD-10-CM | POA: Diagnosis present

## 2017-04-01 DIAGNOSIS — E1129 Type 2 diabetes mellitus with other diabetic kidney complication: Secondary | ICD-10-CM | POA: Diagnosis not present

## 2017-04-01 DIAGNOSIS — I2510 Atherosclerotic heart disease of native coronary artery without angina pectoris: Secondary | ICD-10-CM | POA: Diagnosis present

## 2017-04-01 DIAGNOSIS — Z888 Allergy status to other drugs, medicaments and biological substances status: Secondary | ICD-10-CM

## 2017-04-01 DIAGNOSIS — Z9115 Patient's noncompliance with renal dialysis: Secondary | ICD-10-CM

## 2017-04-01 DIAGNOSIS — E1122 Type 2 diabetes mellitus with diabetic chronic kidney disease: Secondary | ICD-10-CM | POA: Diagnosis not present

## 2017-04-01 DIAGNOSIS — E43 Unspecified severe protein-calorie malnutrition: Secondary | ICD-10-CM | POA: Diagnosis present

## 2017-04-01 DIAGNOSIS — Z992 Dependence on renal dialysis: Secondary | ICD-10-CM | POA: Diagnosis not present

## 2017-04-01 DIAGNOSIS — Z8659 Personal history of other mental and behavioral disorders: Secondary | ICD-10-CM

## 2017-04-01 DIAGNOSIS — E8889 Other specified metabolic disorders: Secondary | ICD-10-CM | POA: Diagnosis not present

## 2017-04-01 DIAGNOSIS — L03116 Cellulitis of left lower limb: Secondary | ICD-10-CM | POA: Diagnosis not present

## 2017-04-01 DIAGNOSIS — D631 Anemia in chronic kidney disease: Secondary | ICD-10-CM | POA: Diagnosis present

## 2017-04-01 DIAGNOSIS — R402413 Glasgow coma scale score 13-15, at hospital admission: Secondary | ICD-10-CM | POA: Diagnosis not present

## 2017-04-01 DIAGNOSIS — R69 Illness, unspecified: Secondary | ICD-10-CM | POA: Diagnosis not present

## 2017-04-01 DIAGNOSIS — I70209 Unspecified atherosclerosis of native arteries of extremities, unspecified extremity: Secondary | ICD-10-CM | POA: Diagnosis not present

## 2017-04-01 DIAGNOSIS — Z008 Encounter for other general examination: Secondary | ICD-10-CM

## 2017-04-01 DIAGNOSIS — Z7401 Bed confinement status: Secondary | ICD-10-CM

## 2017-04-01 DIAGNOSIS — Z89611 Acquired absence of right leg above knee: Secondary | ICD-10-CM

## 2017-04-01 DIAGNOSIS — S31000D Unspecified open wound of lower back and pelvis without penetration into retroperitoneum, subsequent encounter: Secondary | ICD-10-CM

## 2017-04-01 DIAGNOSIS — R5381 Other malaise: Secondary | ICD-10-CM | POA: Diagnosis not present

## 2017-04-01 DIAGNOSIS — Z9049 Acquired absence of other specified parts of digestive tract: Secondary | ICD-10-CM

## 2017-04-01 DIAGNOSIS — Z515 Encounter for palliative care: Secondary | ICD-10-CM | POA: Diagnosis not present

## 2017-04-01 DIAGNOSIS — S91109A Unspecified open wound of unspecified toe(s) without damage to nail, initial encounter: Secondary | ICD-10-CM | POA: Diagnosis not present

## 2017-04-01 DIAGNOSIS — Z66 Do not resuscitate: Secondary | ICD-10-CM | POA: Diagnosis present

## 2017-04-01 DIAGNOSIS — Z720 Tobacco use: Secondary | ICD-10-CM

## 2017-04-01 DIAGNOSIS — M898X9 Other specified disorders of bone, unspecified site: Secondary | ICD-10-CM | POA: Diagnosis present

## 2017-04-01 DIAGNOSIS — G9341 Metabolic encephalopathy: Secondary | ICD-10-CM | POA: Diagnosis present

## 2017-04-01 DIAGNOSIS — L89154 Pressure ulcer of sacral region, stage 4: Secondary | ICD-10-CM | POA: Diagnosis not present

## 2017-04-01 DIAGNOSIS — Z8614 Personal history of Methicillin resistant Staphylococcus aureus infection: Secondary | ICD-10-CM

## 2017-04-01 DIAGNOSIS — F0390 Unspecified dementia without behavioral disturbance: Secondary | ICD-10-CM | POA: Diagnosis not present

## 2017-04-01 DIAGNOSIS — R0602 Shortness of breath: Secondary | ICD-10-CM | POA: Diagnosis not present

## 2017-04-01 DIAGNOSIS — N186 End stage renal disease: Secondary | ICD-10-CM | POA: Diagnosis not present

## 2017-04-01 DIAGNOSIS — I998 Other disorder of circulatory system: Secondary | ICD-10-CM | POA: Diagnosis not present

## 2017-04-01 DIAGNOSIS — D689 Coagulation defect, unspecified: Secondary | ICD-10-CM | POA: Diagnosis not present

## 2017-04-01 DIAGNOSIS — R4189 Other symptoms and signs involving cognitive functions and awareness: Secondary | ICD-10-CM | POA: Diagnosis present

## 2017-04-01 DIAGNOSIS — T7601XA Adult neglect or abandonment, suspected, initial encounter: Secondary | ICD-10-CM | POA: Diagnosis present

## 2017-04-01 DIAGNOSIS — I252 Old myocardial infarction: Secondary | ICD-10-CM

## 2017-04-01 LAB — CBC WITH DIFFERENTIAL/PLATELET
BASOS ABS: 0 10*3/uL (ref 0.0–0.1)
BASOS PCT: 0 %
EOS ABS: 0.1 10*3/uL (ref 0.0–0.7)
Eosinophils Relative: 1 %
HEMATOCRIT: 34.2 % — AB (ref 39.0–52.0)
HEMOGLOBIN: 11.1 g/dL — AB (ref 13.0–17.0)
Lymphocytes Relative: 5 %
Lymphs Abs: 0.7 10*3/uL (ref 0.7–4.0)
MCH: 28.8 pg (ref 26.0–34.0)
MCHC: 32.5 g/dL (ref 30.0–36.0)
MCV: 88.6 fL (ref 78.0–100.0)
Monocytes Absolute: 0.6 10*3/uL (ref 0.1–1.0)
Monocytes Relative: 4 %
NEUTROS ABS: 13.5 10*3/uL — AB (ref 1.7–7.7)
NEUTROS PCT: 90 %
Platelets: 287 10*3/uL (ref 150–400)
RBC: 3.86 MIL/uL — ABNORMAL LOW (ref 4.22–5.81)
RDW: 17.3 % — ABNORMAL HIGH (ref 11.5–15.5)
WBC: 14.9 10*3/uL — AB (ref 4.0–10.5)

## 2017-04-01 LAB — COMPREHENSIVE METABOLIC PANEL
ALT: 18 U/L (ref 17–63)
ANION GAP: 18 — AB (ref 5–15)
AST: 26 U/L (ref 15–41)
Albumin: 2.7 g/dL — ABNORMAL LOW (ref 3.5–5.0)
Alkaline Phosphatase: 105 U/L (ref 38–126)
BILIRUBIN TOTAL: 0.8 mg/dL (ref 0.3–1.2)
BUN: 40 mg/dL — AB (ref 6–20)
CHLORIDE: 91 mmol/L — AB (ref 101–111)
CO2: 26 mmol/L (ref 22–32)
Calcium: 8.8 mg/dL — ABNORMAL LOW (ref 8.9–10.3)
Creatinine, Ser: 9.62 mg/dL — ABNORMAL HIGH (ref 0.61–1.24)
GFR, EST AFRICAN AMERICAN: 5 mL/min — AB (ref >60)
GFR, EST NON AFRICAN AMERICAN: 4 mL/min — AB (ref >60)
Glucose, Bld: 90 mg/dL (ref 65–99)
POTASSIUM: 3.7 mmol/L (ref 3.5–5.1)
Sodium: 135 mmol/L (ref 135–145)
TOTAL PROTEIN: 7.1 g/dL (ref 6.5–8.1)

## 2017-04-01 LAB — I-STAT CG4 LACTIC ACID, ED: LACTIC ACID, VENOUS: 1.94 mmol/L — AB (ref 0.5–1.9)

## 2017-04-01 LAB — PROTIME-INR
INR: 1.3
PROTHROMBIN TIME: 16.1 s — AB (ref 11.4–15.2)

## 2017-04-01 LAB — CBG MONITORING, ED
GLUCOSE-CAPILLARY: 55 mg/dL — AB (ref 65–99)
Glucose-Capillary: 76 mg/dL (ref 65–99)

## 2017-04-01 MED ORDER — PIPERACILLIN-TAZOBACTAM 3.375 G IVPB 30 MIN
3.3750 g | Freq: Once | INTRAVENOUS | Status: AC
  Administered 2017-04-02: 3.375 g via INTRAVENOUS
  Filled 2017-04-01: qty 50
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
## 2017-04-01 NOTE — ED Notes (Signed)
Patient denies pain and is resting comfortably.  
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
## 2017-04-01 NOTE — ED Notes (Signed)
Patient transported to X-ray 

## 2017-04-01 NOTE — ED Notes (Addendum)
Awaiting IV team for IV access. Pt resting at this time. No complaints.

## 2017-04-02 DIAGNOSIS — Z89611 Acquired absence of right leg above knee: Secondary | ICD-10-CM | POA: Diagnosis not present

## 2017-04-02 DIAGNOSIS — I2510 Atherosclerotic heart disease of native coronary artery without angina pectoris: Secondary | ICD-10-CM | POA: Diagnosis present

## 2017-04-02 DIAGNOSIS — I96 Gangrene, not elsewhere classified: Secondary | ICD-10-CM | POA: Diagnosis present

## 2017-04-02 DIAGNOSIS — I132 Hypertensive heart and chronic kidney disease with heart failure and with stage 5 chronic kidney disease, or end stage renal disease: Secondary | ICD-10-CM | POA: Diagnosis present

## 2017-04-02 DIAGNOSIS — I70209 Unspecified atherosclerosis of native arteries of extremities, unspecified extremity: Secondary | ICD-10-CM | POA: Diagnosis present

## 2017-04-02 DIAGNOSIS — N2581 Secondary hyperparathyroidism of renal origin: Secondary | ICD-10-CM | POA: Diagnosis not present

## 2017-04-02 DIAGNOSIS — N186 End stage renal disease: Secondary | ICD-10-CM

## 2017-04-02 DIAGNOSIS — S31000D Unspecified open wound of lower back and pelvis without penetration into retroperitoneum, subsequent encounter: Secondary | ICD-10-CM | POA: Diagnosis not present

## 2017-04-02 DIAGNOSIS — Z515 Encounter for palliative care: Secondary | ICD-10-CM

## 2017-04-02 DIAGNOSIS — Z992 Dependence on renal dialysis: Secondary | ICD-10-CM | POA: Diagnosis not present

## 2017-04-02 DIAGNOSIS — L89154 Pressure ulcer of sacral region, stage 4: Secondary | ICD-10-CM | POA: Diagnosis not present

## 2017-04-02 DIAGNOSIS — L03116 Cellulitis of left lower limb: Secondary | ICD-10-CM | POA: Diagnosis not present

## 2017-04-02 DIAGNOSIS — L97529 Non-pressure chronic ulcer of other part of left foot with unspecified severity: Secondary | ICD-10-CM | POA: Diagnosis not present

## 2017-04-02 DIAGNOSIS — F0390 Unspecified dementia without behavioral disturbance: Secondary | ICD-10-CM | POA: Diagnosis present

## 2017-04-02 DIAGNOSIS — Z008 Encounter for other general examination: Secondary | ICD-10-CM | POA: Diagnosis not present

## 2017-04-02 DIAGNOSIS — E1152 Type 2 diabetes mellitus with diabetic peripheral angiopathy with gangrene: Secondary | ICD-10-CM | POA: Diagnosis not present

## 2017-04-02 DIAGNOSIS — I120 Hypertensive chronic kidney disease with stage 5 chronic kidney disease or end stage renal disease: Secondary | ICD-10-CM | POA: Diagnosis not present

## 2017-04-02 DIAGNOSIS — M898X9 Other specified disorders of bone, unspecified site: Secondary | ICD-10-CM | POA: Diagnosis present

## 2017-04-02 DIAGNOSIS — I509 Heart failure, unspecified: Secondary | ICD-10-CM | POA: Diagnosis present

## 2017-04-02 DIAGNOSIS — Z7189 Other specified counseling: Secondary | ICD-10-CM | POA: Diagnosis not present

## 2017-04-02 DIAGNOSIS — G9341 Metabolic encephalopathy: Secondary | ICD-10-CM | POA: Diagnosis not present

## 2017-04-02 DIAGNOSIS — T7601XA Adult neglect or abandonment, suspected, initial encounter: Secondary | ICD-10-CM | POA: Diagnosis present

## 2017-04-02 DIAGNOSIS — I70262 Atherosclerosis of native arteries of extremities with gangrene, left leg: Secondary | ICD-10-CM | POA: Diagnosis not present

## 2017-04-02 DIAGNOSIS — R4189 Other symptoms and signs involving cognitive functions and awareness: Secondary | ICD-10-CM | POA: Diagnosis present

## 2017-04-02 DIAGNOSIS — E1122 Type 2 diabetes mellitus with diabetic chronic kidney disease: Secondary | ICD-10-CM | POA: Diagnosis present

## 2017-04-02 DIAGNOSIS — L89150 Pressure ulcer of sacral region, unstageable: Secondary | ICD-10-CM | POA: Diagnosis not present

## 2017-04-02 DIAGNOSIS — E43 Unspecified severe protein-calorie malnutrition: Secondary | ICD-10-CM | POA: Diagnosis not present

## 2017-04-02 DIAGNOSIS — R5381 Other malaise: Secondary | ICD-10-CM | POA: Diagnosis present

## 2017-04-02 DIAGNOSIS — Z66 Do not resuscitate: Secondary | ICD-10-CM | POA: Diagnosis present

## 2017-04-02 DIAGNOSIS — M7989 Other specified soft tissue disorders: Secondary | ICD-10-CM | POA: Diagnosis not present

## 2017-04-02 DIAGNOSIS — E8889 Other specified metabolic disorders: Secondary | ICD-10-CM | POA: Diagnosis present

## 2017-04-02 DIAGNOSIS — D631 Anemia in chronic kidney disease: Secondary | ICD-10-CM | POA: Diagnosis present

## 2017-04-02 DIAGNOSIS — X58XXXA Exposure to other specified factors, initial encounter: Secondary | ICD-10-CM | POA: Diagnosis present

## 2017-04-02 DIAGNOSIS — R402413 Glasgow coma scale score 13-15, at hospital admission: Secondary | ICD-10-CM | POA: Diagnosis present

## 2017-04-02 DIAGNOSIS — I998 Other disorder of circulatory system: Secondary | ICD-10-CM | POA: Diagnosis present

## 2017-04-02 LAB — C-REACTIVE PROTEIN: CRP: 19.4 mg/dL — ABNORMAL HIGH (ref <1.0)

## 2017-04-02 LAB — MRSA PCR SCREENING: MRSA BY PCR: POSITIVE — AB

## 2017-04-02 LAB — SEDIMENTATION RATE: Sed Rate: 99 mm/hr — ABNORMAL HIGH (ref 0–16)

## 2017-04-02 LAB — GLUCOSE, CAPILLARY
GLUCOSE-CAPILLARY: 68 mg/dL (ref 65–99)
Glucose-Capillary: 106 mg/dL — ABNORMAL HIGH (ref 65–99)

## 2017-04-02 MED ORDER — PRO-STAT SUGAR FREE PO LIQD
30.0000 mL | Freq: Three times a day (TID) | ORAL | Status: DC
  Administered 2017-04-02 – 2017-04-10 (×18): 30 mL via ORAL
  Filled 2017-04-02 (×20): qty 30

## 2017-04-02 MED ORDER — CHLORHEXIDINE GLUCONATE CLOTH 2 % EX PADS
6.0000 | MEDICATED_PAD | Freq: Every day | CUTANEOUS | Status: AC
  Administered 2017-04-04 – 2017-04-06 (×2): 6 via TOPICAL

## 2017-04-02 MED ORDER — ONDANSETRON HCL 4 MG PO TABS: 4.0000 mg | ORAL_TABLET | Freq: Four times a day (QID) | ORAL | Status: DC | PRN

## 2017-04-02 MED ORDER — CALCITRIOL 0.25 MCG PO CAPS
0.2500 ug | ORAL_CAPSULE | ORAL | Status: DC
  Administered 2017-04-03 – 2017-04-07 (×3): 0.25 ug via ORAL

## 2017-04-02 MED ORDER — RENA-VITE PO TABS
1.0000 | ORAL_TABLET | Freq: Every day | ORAL | Status: DC
  Administered 2017-04-02 – 2017-04-10 (×9): 1 via ORAL
  Filled 2017-04-02 (×9): qty 1

## 2017-04-02 MED ORDER — DEXTROSE 50 % IV SOLN
INTRAVENOUS | Status: AC
  Administered 2017-04-02: 25 mL
  Filled 2017-04-02: qty 50

## 2017-04-02 MED ORDER — VANCOMYCIN HCL IN DEXTROSE 1-5 GM/200ML-% IV SOLN
1000.0000 mg | INTRAVENOUS | Status: DC
  Administered 2017-04-03: 1000 mg via INTRAVENOUS
  Administered 2017-04-05: 1 g via INTRAVENOUS
  Administered 2017-04-07: 1000 mg via INTRAVENOUS
  Filled 2017-04-02 (×2): qty 200

## 2017-04-02 MED ORDER — ONDANSETRON HCL 4 MG/2ML IJ SOLN: 4.0000 mg | Freq: Four times a day (QID) | INTRAMUSCULAR | Status: DC | PRN

## 2017-04-02 MED ORDER — ENSURE ENLIVE PO LIQD
237.0000 mL | Freq: Two times a day (BID) | ORAL | Status: DC
  Administered 2017-04-02 – 2017-04-06 (×5): 237 mL via ORAL

## 2017-04-02 MED ORDER — VANCOMYCIN HCL IN DEXTROSE 1-5 GM/200ML-% IV SOLN
1000.0000 mg | INTRAVENOUS | Status: DC
  Filled 2017-04-02: qty 200

## 2017-04-02 MED ORDER — ACETAMINOPHEN 650 MG RE SUPP: 650.0000 mg | Freq: Four times a day (QID) | RECTAL | Status: DC | PRN

## 2017-04-02 MED ORDER — MUPIROCIN 2 % EX OINT
1.0000 "application " | TOPICAL_OINTMENT | Freq: Two times a day (BID) | CUTANEOUS | Status: AC
  Administered 2017-04-02 – 2017-04-06 (×10): 1 via NASAL
  Filled 2017-04-02 (×2): qty 22

## 2017-04-02 MED ORDER — PIPERACILLIN-TAZOBACTAM 3.375 G IVPB
3.3750 g | Freq: Two times a day (BID) | INTRAVENOUS | Status: DC
  Administered 2017-04-02 – 2017-04-11 (×19): 3.375 g via INTRAVENOUS
  Filled 2017-04-02 (×20): qty 50

## 2017-04-02 MED ORDER — ACETAMINOPHEN 325 MG PO TABS
650.0000 mg | ORAL_TABLET | Freq: Four times a day (QID) | ORAL | Status: DC | PRN
  Administered 2017-04-02 – 2017-04-05 (×10): 650 mg via ORAL
  Filled 2017-04-02 (×9): qty 2

## 2017-04-02 MED ORDER — VANCOMYCIN HCL 10 G IV SOLR
2000.0000 mg | Freq: Once | INTRAVENOUS | Status: AC
  Administered 2017-04-02: 2000 mg via INTRAVENOUS
  Filled 2017-04-02: qty 2000

## 2017-04-02 MED ORDER — HEPARIN SODIUM (PORCINE) 5000 UNIT/ML IJ SOLN
5000.0000 [IU] | Freq: Three times a day (TID) | INTRAMUSCULAR | Status: AC
  Administered 2017-04-02 – 2017-04-04 (×7): 5000 [IU] via SUBCUTANEOUS
  Filled 2017-04-02 (×7): qty 1

## 2017-04-02 NOTE — ED Notes (Signed)
Patient denies pain and is resting comfortably.  

## 2017-04-02 NOTE — Consult Note (Addendum)
WOC Nurse wound consult note Reason for Consult: Consult requested for left foot and sacrum wound. He has discussed amputation of left foot with vascular team in the past several weeks and refused, according to the EMR.   Left first 4 toes are necrotic with 90% dry eschar, evolving to 10% wet gangrene at some of the wound edges, red and moist and fluctuant. Small amt tan drainage, some odor.  EMR indicates that they will discuss with patient again the need for surgery.  Topical treatment will not be effective to promote healing to left foot wounds. Sacrum with extensive unstageable pressure injury; 12X13cm, 90% black loose eschar, beginning to lift at the lower wound edges, revealing 10% stage 3 pressure injury.  Mod amt tan drainage, some odor.   Middle back with darker colored deep tissue pressure injury; 1X1cm Pressure Injury POA: Yes Dressing procedure/placement/frequency: Xeroform gauze to left foot until further input is provided by the vascular team.  If patient desires comfort care and no further surgery, then this dressing will decrease adherence to the wound. Recommend surgical consult for debridement of sacrum wound.  Discussed plan of care with primary team.  Moist gauze dressing until further input is available from their team. If patient does not desire debridement, then comfort care goals should be established. Please re-consult if further assistance is needed.  Thank-you,  Cammie Mcgeeawn Madalee Altmann MSN, RN, CWOCN, [place], CNS [phone]

## 2017-04-02 NOTE — Progress Notes (Signed)
Pharmacy Antibiotic Note  Ian BlakeWarren J Turner is a 81 y.o. male admitted on 04/01/2017 with wound infection.  Pharmacy has been consulted for Vancomycin and Zosyn dosing.   ESRD on HD TTSat PTA, but went to HD as an outpatient on Monday 11/12 and next planned for Wednesday  11/14.  WBC elevated, Afeb. He was loaded with Vancomycin 2g and received Zosyn 3.375g IV x1  Plan: Vancomycin 1000 mg IV qHD-MWF for now Zosyn 3.375g IV q12h EI Follow HD schedule/tolerance, clinical progression, c/s, palliative care consult, vascular plans, LOT   Height: 5\' 10"  (177.8 cm) Weight: 179 lb (81.2 kg) IBW/kg (Calculated) : 73  Temp (24hrs), Avg:98.8 F (37.1 C), Min:98.3 F (36.8 C), Max:99.3 F (37.4 C)  Recent Labs  Lab 04/01/17 2145 04/01/17 2158  WBC 14.9*  --   CREATININE 9.62*  --   LATICACIDVEN  --  1.94*    Estimated Creatinine Clearance: 6.4 mL/min (A) (by C-G formula based on SCr of 9.62 mg/dL (H)).    Allergies  Allergen Reactions  . Iohexol Swelling and Rash  . Zanaflex [Tizanidine] Other (See Comments)    Shut kidneys down Unable to walk or void  . Ezetimibe-Simvastatin Other (See Comments)    Elevated liver enzymes  . Fluvastatin Other (See Comments)    Elevated liver enzymes  . Simvastatin Other (See Comments)    Elevated liver enzymes    Vanc 11/12>> Zosyn 11/12>>  11/12 @ 2145 BcCx: ngtd 11/13 @ 0150 BCx: pending 11/13 MRSA PCR: pos  [name] D. [name], PharmD, BCPS Clinical Pharmacist Clinical Phone for 04/02/2017 until 3:30pm: [phone] If after 3:30pm, please call main pharmacy at [phone] 04/02/2017 1:41 PM
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
## 2017-04-02 NOTE — Consult Note (Signed)
Hospital Consult    Reason for Consult:  Left foot gangrene Referring Physician:  Isidoro DonningRai MRN #:  [id number]  History of Present Illness This is a 81 y.o. male with history of right aka. He has known unreconstructable left lower extremity vascular disease with dry gangrene of his toes. He was sent to the ED by dialysis for worsening gangrene. He has difficulty answering most questions so information obtained from chart and admitting physician. He is clear that he does not want aka on the left.  Past Medical History:  Diagnosis Date  . Anginal pain ([facility])    before CABG  . Arthritis   . CHF (congestive heart failure) ([facility])   . Constipation   . Coronary artery disease   . Diabetes mellitus   . ESRD (end stage renal disease) ([facility])    TuesThurs Sat [place]  . Hypertension   . Myocardial infarction ([facility])   . Pneumonia    2 years ago    Past Surgical History:  Procedure Laterality Date  . ARTERIOVENOUS GRAFT PLACEMENT Left   . BACK SURGERY    . CORONARY ARTERY BYPASS GRAFT    . LAPAROSCOPIC COLON RESECTION     growth    Allergies  Allergen Reactions  . Iohexol Swelling and Rash  . Zanaflex [Tizanidine] Other (See Comments)    Shut kidneys down Unable to walk or void  . Ezetimibe-Simvastatin Other (See Comments)    Elevated liver enzymes  . Fluvastatin Other (See Comments)    Elevated liver enzymes  . Simvastatin Other (See Comments)    Elevated liver enzymes    Prior to Admission medications   Medication Sig Start Date End Date Taking? Authorizing Provider  calcitRIOL (ROCALTROL) 0.25 MCG capsule Take 1 capsule (0.25 mcg total) by mouth every Monday, Wednesday, and Friday with hemodialysis. Patient not taking: Reported on 04/01/2017 02/20/17 04/06/17  Haydee SalterHobbs, Phillip M, MD  Glycerin, Adult, 2.1 g SUPP Place 1 suppository rectally daily as needed for moderate constipation. Patient not taking: Reported on 04/01/2017 02/19/17   Haydee SalterHobbs, Phillip M, MD  povidone-iodine (BETADINE)  10 % external solution Apply topically daily. Patient not taking: Reported on 04/01/2017 02/20/17   Haydee SalterHobbs, Phillip M, MD    Social History   Socioeconomic History  . Marital status: Married    Spouse name: Not on file  . Number of children: Not on file  . Years of education: Not on file  . Highest education level: Not on file  Social Needs  . Financial resource strain: Not on file  . Food insecurity - worry: Not on file  . Food insecurity - inability: Not on file  . Transportation needs - medical: Not on file  . Transportation needs - non-medical: Not on file  Occupational History  . Not on file  Tobacco Use  . Smoking status: Never Smoker  . Smokeless tobacco: Current User    Types: Snuff  Substance and Sexual Activity  . Alcohol use: No    Alcohol/week: 0.0 oz  . Drug use: No  . Sexual activity: Not on file  Other Topics Concern  . Not on file  Social History Narrative  . Not on file     Family History  Problem Relation Age of Onset  . Cancer Father   . Diabetes Sister     ROS: unable to accurately obtain, he does complain of left foot pain   Physical Examination  Vitals:   04/02/17 0332 04/02/17 0924  BP: (!) 110/49 (!) 128/54  Pulse: 90 82  Resp: 16 17  Temp: 99.3 F (37.4 C) 98.8 F (37.1 C)  SpO2: 99% 99%   Body mass index is 25.68 kg/m.  General: nad HENT: WNL, normocephalic Pulmonary: normal non-labored breathing Abdomen:  soft, NT/ND, no masses Musculoskeletal: healing right aka  Neurologic: alert      CBC    Component Value Date/Time   WBC 14.9 (H) 04/01/2017 2145   RBC 3.86 (L) 04/01/2017 2145   HGB 11.1 (L) 04/01/2017 2145   HCT 34.2 (L) 04/01/2017 2145   PLT 287 04/01/2017 2145   MCV 88.6 04/01/2017 2145   MCH 28.8 04/01/2017 2145   MCHC 32.5 04/01/2017 2145   RDW 17.3 (H) 04/01/2017 2145   LYMPHSABS 0.7 04/01/2017 2145   MONOABS 0.6 04/01/2017 2145   EOSABS 0.1 04/01/2017 2145   BASOSABS 0.0 04/01/2017 2145     BMET    Component Value Date/Time   NA 135 04/01/2017 2145   K 3.7 04/01/2017 2145   CL 91 (L) 04/01/2017 2145   CO2 26 04/01/2017 2145   GLUCOSE 90 04/01/2017 2145   BUN 40 (H) 04/01/2017 2145   CREATININE 9.62 (H) 04/01/2017 2145   CALCIUM 8.8 (L) 04/01/2017 2145   CALCIUM 8.4 11/29/2007 1515   GFRNONAA 4 (L) 04/01/2017 2145   GFRAA 5 (L) 04/01/2017 2145    COAGS: Lab Results  Component Value Date   INR 1.30 04/01/2017   INR 1.10 01/01/2017   INR 1.3 12/04/2008       ASSESSMENT/PLAN: This is a 81 y.o. male here with mild worsening of left foot gangrene. He continues to refuse aka on the left which would be his only option. Given that he does not appear septic he does not require operation. Agree with palliative care discussion of goals of care given missed hd treatments and progressive gangrene of left foot. Please call if we can be of further assistance.   Margarie Mcguirt C. Randie Heinzain, MD [facility] Office: [phone] Pager: [phone]

## 2017-04-02 NOTE — Progress Notes (Signed)
Pharmacy Antibiotic Note  [name] is a 81 y.o. male admitted on 04/01/2017 with wound infection.  Pharmacy has been consulted for Vancomycin dosing. WBC elevated. ESRD on HD TTS.   Plan: Vancomycin 2000 mg IV x 1, then 1000 mg IV qHD TTS Trend WBC, temp, HD schedule F/U infectious work-up Drug levels as indicated   Height: 5\' 10"  (177.8 cm) Weight: 187 lb 9.8 oz (85.1 kg) IBW/kg (Calculated) : 73  Temp (24hrs), Avg:98.3 F (36.8 C), Min:98.3 F (36.8 C), Max:98.3 F (36.8 C)  Recent Labs  Lab 04/01/17 2145 04/01/17 2158  WBC 14.9*  --   CREATININE 9.62*  --   LATICACIDVEN  --  1.94*    Estimated Creatinine Clearance: 6.4 mL/min (A) (by C-G formula based on SCr of 9.62 mg/dL (H)).    Allergies  Allergen Reactions  . Iohexol Swelling and Rash  . Zanaflex [Tizanidine] Other (See Comments)    Shut kidneys down Unable to walk or void  . Ezetimibe-Simvastatin Other (See Comments)    Elevated liver enzymes  . Fluvastatin Other (See Comments)    Elevated liver enzymes  . Simvastatin Other (See Comments)    Elevated liver enzymes     [name], [name] 04/02/2017 3:14 AM

## 2017-04-02 NOTE — Consult Note (Signed)
Consultation Note Date: 04/02/2017   Patient Name: [name]  DOB: 1935/12/07  MRN: [id number]  Age / Sex: 81 y.o., male  PCP: Crist Fat, MD Referring Physician: Cathren Harsh, MD  Reason for Consultation: Establishing goals of care and Psychosocial/spiritual support  HPI/Patient Profile: 81 y.o. male   admitted on 04/01/2017 with  medical history significant of ESRD, HTN, AKA of R leg back in Sept this year.  Patient also has necrosis of toes on his L foot, recommendation in the past has been AKA but patient has refused.  Patient discharged from SNF to home and there is some question regarding how care plan played out, he missed all dialysis treatments, sacral wound developed/ appeared and he ended back in the hospital.    Today he is confused and un-able to make medial decisions for himself.  He has had continued physical, functional and cognitive decline per family  Palliative care consulted to assist family with treatment option decisions, advanced directive decisions and anticipatory care needs.   Clinical Assessment and Goals of Care:   This NP Lorinda Creed reviewed medical records, received report from team, assessed the patient and then meet at the patient's bedside along with his son/Jarred and his wife/ [name]  to discuss diagnosis, prognosis, GOC, EOL wishes disposition and options.  A detailed discussion was had today regarding advanced directives.  Concepts specific to code status, artifical feeding and hydration, continued IV antibiotics, discontinuation of dialysis,  and rehospitalization was had.  The difference between a aggressive medical intervention path  and a palliative comfort care path for this patient at this time was had.  Values and goals of care important to patient and family were attempted to be elicited.  MOST form introduced  Concept of Hospice and Palliative Care  were discussed  Natural trajectory and expectations at EOL were discussed.  Questions and concerns addressed.   Family encouraged to call with questions or concerns.  PMT will continue to support holistically.      NEXT OF KIN/ wife/ Clare Charon,      Along with support of other family members.  Patient has four children, one son/Asif is most involved    SUMMARY OF RECOMMENDATIONS    Code Status/Advance Care Planning:  Full code- encouraged to consider a DNR/DNI status knowing poor outcomes in similar patient, family is leaning toward a DNR but need time to process before final decsion    Palliative Prophylaxis:   Aspiration, Bowel Regimen, Delirium Protocol, Frequent Pain Assessment and Oral Care  Additional Recommendations (Limitations, Scope, Preferences):  Full Scope Treatment- family is processing the situation, they understand the seriousness of his medical condistions.  They are frustrated with the limited support available to them to care for the man at home.  At this time they are open to all offered and available medical interventions to prolong life.   Psycho-social/Spiritual:   Desire for further Chaplaincy support:no  Additional Recommendations: Education on Hospice  Prognosis:   Unable to determine- depends on desire  for life prolonging interventions   Discharge Planning:  To Be Determined      Family tell me that he was discharged from SNF because he could not pay for it any more, so they took him home.  They tried to get transportation for the patient to and from dialysis but were told it would cost $40 a day, "we don't have that kind of money".  Patient missed several dialysis treatments, he has a noted un-stageable  sacral pressure sore   And gangrenous left toes "look worst" per wife.  Family is requesting "help" in the home to care for him.  We discussed limitations of available services in the home care setting outside of out of pocket caregivers  and family members themselves.  It may be impossible for family to provide safe effective home care         Primary Diagnoses: Present on Admission: . Gangrene of left foot (HCC) . Cellulitis of left foot . End stage renal disease (HCC) . Dementia   I have reviewed the medical record, interviewed the patient and family, and examined the patient. The following aspects are pertinent.  Past Medical History:  Diagnosis Date  . Anginal pain (HCC)    before CABG  . Arthritis   . CHF (congestive heart failure) (HCC)   . Constipation   . Coronary artery disease   . Diabetes mellitus   . ESRD (end stage renal disease) (HCC)    TuesThurs Sat [place]  . Hypertension   . Myocardial infarction (HCC)   . Pneumonia    2 years ago   Social History   Socioeconomic History  . Marital status: Married    Spouse name: None  . Number of children: None  . Years of education: None  . Highest education level: None  Social Needs  . Financial resource strain: None  . Food insecurity - worry: None  . Food insecurity - inability: None  . Transportation needs - medical: None  . Transportation needs - non-medical: None  Occupational History  . None  Tobacco Use  . Smoking status: Never Smoker  . Smokeless tobacco: Current User    Types: Snuff  Substance and Sexual Activity  . Alcohol use: No    Alcohol/week: 0.0 oz  . Drug use: No  . Sexual activity: None  Other Topics Concern  . None  Social History Narrative  . None   Family History  Problem Relation Age of Onset  . Cancer Father   . Diabetes Sister    Scheduled Meds: . [START ON 04/03/2017] calcitRIOL  0.25 mcg Oral Q M,W,F-HD  . Chlorhexidine Gluconate Cloth  6 each Topical Q0600  . heparin  5,000 Units Subcutaneous Q8H  . mupirocin ointment  1 application Nasal BID   Continuous Infusions: . vancomycin     PRN Meds:.acetaminophen **OR** acetaminophen, ondansetron **OR** ondansetron (ZOFRAN) IV Medications Prior  to Admission:  Prior to Admission medications   Medication Sig Start Date End Date Taking? Authorizing Provider  calcitRIOL (ROCALTROL) 0.25 MCG capsule Take 1 capsule (0.25 mcg total) by mouth every Monday, Wednesday, and Friday with hemodialysis. Patient not taking: Reported on 04/01/2017 02/20/17 04/06/17  Haydee SalterHobbs, Phillip M, MD  Glycerin, Adult, 2.1 g SUPP Place 1 suppository rectally daily as needed for moderate constipation. Patient not taking: Reported on 04/01/2017 02/19/17   Haydee SalterHobbs, Phillip M, MD  povidone-iodine (BETADINE) 10 % external solution Apply topically daily. Patient not taking: Reported on 04/01/2017 02/20/17   Haydee SalterHobbs, Phillip M,  MD   Allergies  Allergen Reactions  . Iohexol Swelling and Rash  . Zanaflex [Tizanidine] Other (See Comments)    Shut kidneys down Unable to walk or void  . Ezetimibe-Simvastatin Other (See Comments)    Elevated liver enzymes  . Fluvastatin Other (See Comments)    Elevated liver enzymes  . Simvastatin Other (See Comments)    Elevated liver enzymes   Review of Systems  Unable to perform ROS: Mental status change    Physical Exam  Constitutional: He appears well-developed. He appears lethargic. He appears ill.  Cardiovascular: Tachycardia present.  Pulmonary/Chest: He has decreased breath sounds in the right lower field and the left lower field.  Musculoskeletal:  generalized weakness and atrophy -noted R AKA  - noted necrotic left foot/ toes, foot with warmth and redness  Neurological: He appears lethargic.  Skin: Skin is warm and dry.    Vital Signs: BP (!) 128/54 (BP Location: Right Arm)   Pulse 82   Temp 98.8 F (37.1 C) (Oral)   Resp 17   Ht 5\' 10"  (1.778 m)   Wt 81.2 kg (179 lb)   SpO2 99%   BMI 25.68 kg/m  Pain Assessment: No/denies pain       SpO2: SpO2: 99 % O2 Device:SpO2: 99 % O2 Flow Rate: .   IO: Intake/output summary:   Intake/Output Summary (Last 24 hours) at 04/02/2017 1013 Last data filed at 04/02/2017  1000 Gross per 24 hour  Intake 600 ml  Output 0 ml  Net 600 ml    LBM:   Baseline Weight: Weight: 85.1 kg (187 lb 9.8 oz) Most recent weight: Weight: 81.2 kg (179 lb)     Palliative Assessment/Data: 30 % at best   Discussed with Dr Isidoro Donningai  Time In: 0945 Time Out: 1100 Time Total: 75 min Greater than 50%  of this time was spent counseling and coordinating care related to the above assessment and plan.  Signed by: Lorinda CreedLARACH, Meenakshi Sazama, NP   Please contact Palliative Medicine Team phone at [phone] for questions and concerns.  For individual provider: See Loretha StaplerAmion

## 2017-04-02 NOTE — Progress Notes (Addendum)
Patient seen and examined, admitted by Dr. Julian ReilGardner earlier this morning.  Briefly 81 year old male with ESRD, hypertension, right AKA, presented with necrosis of toes on the left feet.  Patient also had been missing dialysis for 1 week, was sent by HD center due to worsening necrosis.  BP (!) 128/54 (BP Location: Right Arm)   Pulse 82   Temp 98.8 F (37.1 C) (Oral)   Resp 17   Ht 5\' 10"  (1.778 m)   Wt 81.2 kg (179 lb)   SpO2 99%   BMI 25.68 kg/m   Left toes necrosis/gangrene -Leukocytosis, afebrile -Patient currently on vancomycin and Zosyn -Discussed with vascular surgery, Dr. Randie Heinzain (who knows the patient), evaluated the patient and recommended palliative care for goals of care, patient has refused amputation of the left foot multiple times, was recommended 2 weeks ago -Per vascular surgery, no need for emergent surgery unless patient agrees or septic/medically indicated -Palliative care consulted  Dementia -Unclear if patient has decision-making capacity, has been missing dialysis for last 1 week -Psych evaluation requested  ESRD - nephrology consulted   Rashanda Magloire M.D. [facility] 04/02/2017, 10:35 AM  Pager: [phone]  Called by WOC RN, Dawn that patient's sacral decub needs surgical evaluation, patient is agreeable. Surgery consulted.      Thad Rangeripudeep Kule Gascoigne M.D. [facility] 04/02/2017, 2:49 PM  Pager: [phone]

## 2017-04-02 NOTE — Progress Notes (Signed)
Patient has positive (+) MRSA PCR,standing order for positive mrsa already initiated.Patient placed on contact isolation per protocol.MD on call text paged. Epifania Gore. Brit Carbonell, Drinda Buttsharito Joselita, RN

## 2017-04-02 NOTE — Consult Note (Signed)
[facility] Face-to-Face Psychiatry Consult   Reason for Consult:  Capacity to make medical decisions regarding toe surgery.  Referring Physician:  Dr. Tana Coast Patient Identification: [name] MRN:  [id number] Principal Diagnosis: Evaluation by psychiatric service required Diagnosis:   Patient Active Problem List   Diagnosis Date Noted  . Gangrene of left foot ([place]) [I96] 04/02/2017  . Cellulitis of left foot [L03.116] 04/02/2017  . Dementia [F03.90] 04/02/2017  . Pressure injury of skin [L89.90] 02/11/2017  . Gangrene of foot ([place]) [I96] 02/10/2017  . Sepsis due to cellulitis ([place]) [L03.90, A41.9] 01/01/2017  . Hypertension [I10] 01/01/2017  . Coronary artery disease [I25.10] 01/01/2017  . CKD (chronic kidney disease) stage V requiring chronic dialysis ([place]) [N18.6, Z99.2] 01/01/2017  . Peripheral vascular disease of lower extremity with ulceration/RLE cellulitis ([place]) [I73.9, L97.909] 01/01/2017  . Acute metabolic encephalopathy [L93.79] 01/01/2017  . Diabetes mellitus with complication ([place]) [K24.0]   . Hyperkalemia [E87.5] 12/26/2016  . Ulcer of right foot due to type 2 diabetes mellitus ([place]) [X73.532, L97.519] 12/26/2016  . Cellulitis of right foot [L03.115] 12/26/2016  . Noncompliance of patient with renal dialysis ([place]) [Z91.15] 12/26/2016  . End stage renal disease ([place]) [N18.6] 09/09/2013    Total Time spent with patient: 1 hour  Subjective:   [name] is a 81 y.o. male patient admitted with gangrene of left foot.  HPI:  Per chart review, patient was admitted for treatment of left toe necrosis. He sees Dr. Donzetta Matters in the clinic and refused left AKA at his follow up on 10/26. He similarly refused right AKA in September until he became septic and therefore had no alternatives. He has had a chronic mental decline and altered mental status over a several month period. He has also had poor follow up for dialysis because his family members have not been able to provide  transportation.    On interview, [name] is able to state his name. He reports that he is 81 y/o and is unable to provide his date of birth. He reports, "I don't know" when asked why he is admitted to the hospital. He is informed of the reason he is here. He reports that he does not want surgery because he would not be able to move around. He reports that he uses a wheelchair now and was unable to understand that he could still use a wheelchair to ambulate after surgery. He was unable to state the risks of not having surgery although he was reminded of his last surgery that was required after he became septic.   [name] denies a history of psychiatric illness. He denies SI, HI or AVH. He denies problems with sleep or appetite. He denies substance use.    Past Psychiatric History: Denies   Risk to Self: Is patient at risk for suicide?: No Risk to Others:  None. Denies  Prior Inpatient Therapy:  Denies  Prior Outpatient Therapy:  Denies   Past Medical History:  Past Medical History:  Diagnosis Date  . Anginal pain ([place])    before CABG  . Arthritis   . CHF (congestive heart failure) ([place])   . Constipation   . Coronary artery disease   . Diabetes mellitus   . ESRD (end stage renal disease) ([place])    TuesThurs Sat [place]  . Hypertension   . Myocardial infarction ([place])   . Pneumonia    2 years ago    Past Surgical History:  Procedure Laterality Date  . ARTERIOVENOUS GRAFT PLACEMENT  Left   . BACK SURGERY    . CORONARY ARTERY BYPASS GRAFT    . LAPAROSCOPIC COLON RESECTION     growth   Family History:  Family History  Problem Relation Age of Onset  . Cancer Father   . Diabetes Sister    Family Psychiatric  History: Denies  Social History:  Social History   Substance and Sexual Activity  Alcohol Use No  . Alcohol/week: 0.0 oz     Social History   Substance and Sexual Activity  Drug Use No    Social History   Socioeconomic History  . Marital status: Married     Spouse name: None  . Number of children: None  . Years of education: None  . Highest education level: None  Social Needs  . Financial resource strain: None  . Food insecurity - worry: None  . Food insecurity - inability: None  . Transportation needs - medical: None  . Transportation needs - non-medical: None  Occupational History  . None  Tobacco Use  . Smoking status: Never Smoker  . Smokeless tobacco: Current User    Types: Snuff  Substance and Sexual Activity  . Alcohol use: No    Alcohol/week: 0.0 oz  . Drug use: No  . Sexual activity: None  Other Topics Concern  . None  Social History Narrative  . None   Additional Social History: He lives at home with his wife, son and daughter. He denies alcohol, tobacco or illicit substance use. His highest level of education was 3rd grade.     Allergies:   Allergies  Allergen Reactions  . Iohexol Swelling and Rash  . Zanaflex [Tizanidine] Other (See Comments)    Shut kidneys down Unable to walk or void  . Ezetimibe-Simvastatin Other (See Comments)    Elevated liver enzymes  . Fluvastatin Other (See Comments)    Elevated liver enzymes  . Simvastatin Other (See Comments)    Elevated liver enzymes    Labs:  Results for orders placed or performed during the hospital encounter of 04/01/17 (from the past 48 hour(s))  CBG monitoring, ED     Status: Abnormal   Collection Time: 04/01/17  7:53 PM  Result Value Ref Range   Glucose-Capillary 55 (L) 65 - 99 mg/dL   Comment 1 Notify RN   CBG monitoring, ED     Status: None   Collection Time: 04/01/17  8:40 PM  Result Value Ref Range   Glucose-Capillary 76 65 - 99 mg/dL  Comprehensive metabolic panel     Status: Abnormal   Collection Time: 04/01/17  9:45 PM  Result Value Ref Range   Sodium 135 135 - 145 mmol/L   Potassium 3.7 3.5 - 5.1 mmol/L   Chloride 91 (L) 101 - 111 mmol/L   CO2 26 22 - 32 mmol/L   Glucose, Bld 90 65 - 99 mg/dL   BUN 40 (H) 6 - 20 mg/dL   Creatinine, Ser  9.62 (H) 0.61 - 1.24 mg/dL   Calcium 8.8 (L) 8.9 - 10.3 mg/dL   Total Protein 7.1 6.5 - 8.1 g/dL   Albumin 2.7 (L) 3.5 - 5.0 g/dL   AST 26 15 - 41 U/L   ALT 18 17 - 63 U/L   Alkaline Phosphatase 105 38 - 126 U/L   Total Bilirubin 0.8 0.3 - 1.2 mg/dL   GFR calc non Af Amer 4 (L) >60 mL/min   GFR calc Af Amer 5 (L) >60 mL/min    Comment: (  NOTE) The eGFR has been calculated using the CKD EPI equation. This calculation has not been validated in all clinical situations. eGFR's persistently <60 mL/min signify possible Chronic Kidney Disease.    Anion gap 18 (H) 5 - 15  Protime-INR     Status: Abnormal   Collection Time: 04/01/17  9:45 PM  Result Value Ref Range   Prothrombin Time 16.1 (H) 11.4 - 15.2 seconds   INR 1.30   CBC with Differential     Status: Abnormal   Collection Time: 04/01/17  9:45 PM  Result Value Ref Range   WBC 14.9 (H) 4.0 - 10.5 K/uL   RBC 3.86 (L) 4.22 - 5.81 MIL/uL   Hemoglobin 11.1 (L) 13.0 - 17.0 g/dL   HCT 34.2 (L) 39.0 - 52.0 %   MCV 88.6 78.0 - 100.0 fL   MCH 28.8 26.0 - 34.0 pg   MCHC 32.5 30.0 - 36.0 g/dL   RDW 17.3 (H) 11.5 - 15.5 %   Platelets 287 150 - 400 K/uL   Neutrophils Relative % 90 %   Neutro Abs 13.5 (H) 1.7 - 7.7 K/uL   Lymphocytes Relative 5 %   Lymphs Abs 0.7 0.7 - 4.0 K/uL   Monocytes Relative 4 %   Monocytes Absolute 0.6 0.1 - 1.0 K/uL   Eosinophils Relative 1 %   Eosinophils Absolute 0.1 0.0 - 0.7 K/uL   Basophils Relative 0 %   Basophils Absolute 0.0 0.0 - 0.1 K/uL  Culture, blood (routine x 2)     Status: None (Preliminary result)   Collection Time: 04/01/17  9:45 PM  Result Value Ref Range   Specimen Description BLOOD RIGHT HAND    Special Requests IN PEDIATRIC BOTTLE Blood Culture adequate volume    Culture NO GROWTH < 12 HOURS    Report Status PENDING   I-Stat CG4 Lactic Acid, ED     Status: Abnormal   Collection Time: 04/01/17  9:58 PM  Result Value Ref Range   Lactic Acid, Venous 1.94 (H) 0.5 - 1.9 mmol/L  Culture,  blood (routine x 2)     Status: None (Preliminary result)   Collection Time: 04/02/17  1:50 AM  Result Value Ref Range   Specimen Description BLOOD RIGHT WRIST    Special Requests      BOTTLES DRAWN AEROBIC AND ANAEROBIC Blood Culture results may not be optimal due to an inadequate volume of blood received in culture bottles   Culture PENDING    Report Status PENDING   MRSA PCR Screening     Status: Abnormal   Collection Time: 04/02/17  3:39 AM  Result Value Ref Range   MRSA by PCR POSITIVE (A) NEGATIVE    Comment:        The GeneXpert MRSA Assay (FDA approved for NASAL specimens only), is one component of a comprehensive MRSA colonization surveillance program. It is not intended to diagnose MRSA infection nor to guide or monitor treatment for MRSA infections. RESULT CALLED TO, READ BACK BY AND VERIFIED WITH: BOKIAGON,C RN 4010 04/02/17 MITCHELL,L   C-reactive protein     Status: Abnormal   Collection Time: 04/02/17  4:11 AM  Result Value Ref Range   CRP 19.4 (H) <1.0 mg/dL    Current Facility-Administered Medications  Medication Dose Route Frequency Provider Last Rate Last Dose  . acetaminophen (TYLENOL) tablet 650 mg  650 mg Oral Q6H PRN Etta Quill, DO       Or  . acetaminophen (TYLENOL) suppository 650 mg  650 mg Rectal Q6H PRN Etta Quill, DO      . [START ON 04/03/2017] calcitRIOL (ROCALTROL) capsule 0.25 mcg  0.25 mcg Oral Q M,W,F-HD Etta Quill, DO      . Chlorhexidine Gluconate Cloth 2 % PADS 6 each  6 each Topical Q0600 Etta Quill, DO      . heparin injection 5,000 Units  5,000 Units Subcutaneous Q8H Alcario Drought, Jared M, DO      . mupirocin ointment (BACTROBAN) 2 % 1 application  1 application Nasal BID Etta Quill, DO      . ondansetron [facility]) tablet 4 mg  4 mg Oral Q6H PRN Etta Quill, DO       Or  . ondansetron [facility]) injection 4 mg  4 mg Intravenous Q6H PRN Etta Quill, DO      . vancomycin (VANCOCIN) IVPB 1000 mg/200 mL  premix  1,000 mg Intravenous Q T,Th,Sa-HD Erenest Blank, [facility]        Musculoskeletal: Strength & Muscle Tone: Patient with right AKA and wheelchair bound. Gait & Station: He has a right AKA and necrosing left toe and is wheelchair bound.  Patient leans: N/A  Psychiatric Specialty Exam: Physical Exam  Nursing note and vitals reviewed. Constitutional: He appears well-developed and well-nourished.  HENT:  Head: Normocephalic and atraumatic.  Neck: Normal range of motion.  Respiratory: Effort normal.  Musculoskeletal: Normal range of motion.  Neurological: He is alert.  Oriented to person and hospital.  Skin: No rash noted.    Review of Systems  Constitutional: Negative for chills and fever.  Gastrointestinal: Negative for constipation, diarrhea, nausea and vomiting.  Musculoskeletal:       Left foot pain.    Blood pressure (!) 128/54, pulse 82, temperature 98.8 F (37.1 C), temperature source Oral, resp. rate 17, height '5\' 10"'$  (1.778 m), weight 81.2 kg (179 lb), SpO2 99 %.Body mass index is 25.68 kg/m.  General Appearance: Well Groomed, African American male with gray and thinning hair, unshaved face and a hospital gown who is lying in bed. NAD.   Eye Contact:  Good  Speech:  Normal Rate  Volume:  Normal  Mood:  Euthymic  Affect:  Constricted  Thought Process:  Goal Directed and Linear  Orientation:  Other:  Oriented to person and hospital.  Thought Content:  Logical  Suicidal Thoughts:  No  Homicidal Thoughts:  No  Memory:  Immediate;   Poor Recent;   Poor Remote;   Fair  Judgement:  Poor  Insight:  Lacking  Psychomotor Activity:  Normal  Concentration:  Concentration: Fair and Attention Span: Fair  Recall:  Poor  Fund of Knowledge:  Poor  Language:  Fair  Akathisia:  No  Handed:  Right  AIMS (if indicated):   N/A  Assets:  Financial Resources/Insurance Housing  ADL's:  Intact  Cognition:  Deficits in short and long term memory.   Sleep:   Okay    Assessment:  [name] is a 81 y.o. male who was admitted with gangrene of left foot. Psychiatry was consulted to determine if patient demonstrates capacity to make medical decisions regarding refusal of toe/foot surgery. He does not demonstrate capacity to make medical decisions about toe/foot surgery. He cannot appreciate the nature of his current condition and the risk versus benefits associated with treatment. He will need an authorized representative assigned to help him make these decisions.   Treatment Plan Summary: -Patient does not demonstrate capacity to refuse surgery  for left toe necrosis. He will need an authorized representative assigned to help him make these decisions.  -Psychiatry will sign off on patient at this time. Please consult psychiatry again as needed.   Disposition: No evidence of imminent risk to self or others at present.   Patient does not meet criteria for psychiatric inpatient admission.  Faythe Dingwall, DO 04/02/2017 10:30 AM
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
## 2017-04-03 DIAGNOSIS — Z7189 Other specified counseling: Secondary | ICD-10-CM

## 2017-04-03 DIAGNOSIS — Z515 Encounter for palliative care: Secondary | ICD-10-CM

## 2017-04-03 LAB — CBC
HCT: 29.6 % — ABNORMAL LOW (ref 39.0–52.0)
Hemoglobin: 9.3 g/dL — ABNORMAL LOW (ref 13.0–17.0)
MCH: 27.8 pg (ref 26.0–34.0)
MCHC: 31.4 g/dL (ref 30.0–36.0)
MCV: 88.4 fL (ref 78.0–100.0)
PLATELETS: 242 10*3/uL (ref 150–400)
RBC: 3.35 MIL/uL — ABNORMAL LOW (ref 4.22–5.81)
RDW: 17 % — AB (ref 11.5–15.5)
WBC: 11.1 10*3/uL — ABNORMAL HIGH (ref 4.0–10.5)

## 2017-04-03 LAB — RENAL FUNCTION PANEL
Albumin: 2.1 g/dL — ABNORMAL LOW (ref 3.5–5.0)
Anion gap: 14 (ref 5–15)
BUN: 53 mg/dL — ABNORMAL HIGH (ref 6–20)
CO2: 27 mmol/L (ref 22–32)
Calcium: 8.5 mg/dL — ABNORMAL LOW (ref 8.9–10.3)
Chloride: 93 mmol/L — ABNORMAL LOW (ref 101–111)
Creatinine, Ser: 11.05 mg/dL — ABNORMAL HIGH (ref 0.61–1.24)
GFR calc Af Amer: 4 mL/min — ABNORMAL LOW (ref >60)
GFR calc non Af Amer: 4 mL/min — ABNORMAL LOW (ref >60)
Glucose, Bld: 83 mg/dL (ref 65–99)
Phosphorus: 6.9 mg/dL — ABNORMAL HIGH (ref 2.5–4.6)
Potassium: 4 mmol/L (ref 3.5–5.1)
Sodium: 134 mmol/L — ABNORMAL LOW (ref 135–145)

## 2017-04-03 LAB — PREALBUMIN: Prealbumin: 10.3 mg/dL — ABNORMAL LOW (ref 18–38)

## 2017-04-03 MED ORDER — ACETAMINOPHEN 325 MG PO TABS
ORAL_TABLET | ORAL | Status: AC
  Filled 2017-04-03: qty 2

## 2017-04-03 MED ORDER — ALTEPLASE 2 MG IJ SOLR: 2.0000 mg | Freq: Once | INTRAMUSCULAR | Status: DC | PRN

## 2017-04-03 MED ORDER — HEPARIN SODIUM (PORCINE) 1000 UNIT/ML DIALYSIS: 1000.0000 [IU] | INTRAMUSCULAR | Status: DC | PRN

## 2017-04-03 MED ORDER — SODIUM CHLORIDE 0.9 % IV SOLN: 100.0000 mL | INTRAVENOUS | Status: DC | PRN

## 2017-04-03 MED ORDER — VANCOMYCIN HCL IN DEXTROSE 1-5 GM/200ML-% IV SOLN
INTRAVENOUS | Status: AC
  Filled 2017-04-03: qty 200

## 2017-04-03 MED ORDER — LIDOCAINE HCL (PF) 1 % IJ SOLN: 5.0000 mL | INTRAMUSCULAR | Status: DC | PRN

## 2017-04-03 MED ORDER — LIDOCAINE-PRILOCAINE 2.5-2.5 % EX CREA: 1.0000 "application " | TOPICAL_CREAM | CUTANEOUS | Status: DC | PRN

## 2017-04-03 MED ORDER — PENTAFLUOROPROP-TETRAFLUOROETH EX AERO: 1.0000 "application " | INHALATION_SPRAY | CUTANEOUS | Status: DC | PRN

## 2017-04-03 MED ORDER — CALCITRIOL 0.25 MCG PO CAPS
ORAL_CAPSULE | ORAL | Status: AC
  Filled 2017-04-03: qty 1

## 2017-04-03 MED ORDER — HEPARIN SODIUM (PORCINE) 1000 UNIT/ML DIALYSIS
8500.0000 [IU] | Freq: Once | INTRAMUSCULAR | Status: DC
  Filled 2017-04-03: qty 9

## 2017-04-03 NOTE — Progress Notes (Signed)
[facility] Progress Note   Subjective: No C/Os. Says he is in [place]. Oriented to self only.    Objective Vitals:   04/02/17 0924 04/02/17 1723 04/02/17 2224 04/03/17 0621  BP: (!) 128/54 (!) 125/54 (!) 103/48 115/68  Pulse: 82 60 85 88  Resp: 17 16 18 20   Temp: 98.8 F (37.1 C) 99.1 F (37.3 C) 98.6 F (37 C) 98.5 F (36.9 C)  TempSrc: Oral Oral Oral Oral  SpO2: 99% 96% 100% 100%  Weight:   81.1 kg (178 lb 12.7 oz)   Height:       Physical Exam General: Chronically ill appearing male in NAD Heart: S1,S2 RRR Lungs: CTAB A/P Abdomen: Active BS  Extremities: R AKA. Evidence of dry with wet gangrene 2-4th L toes. Edema and demarcation L foot.  Dialysis Access: LFA AVG cannulated at present.    Additional Objective Labs: Basic Metabolic Panel: Recent Labs  Lab 04/01/17 2145  NA 135  K 3.7  CL 91*  CO2 26  GLUCOSE 90  BUN 40*  CREATININE 9.62*  CALCIUM 8.8*   Liver Function Tests: Recent Labs  Lab 04/01/17 2145  AST 26  ALT 18  ALKPHOS 105  BILITOT 0.8  PROT 7.1  ALBUMIN 2.7*   No results for input(s): LIPASE, AMYLASE in the last 168 hours. CBC: Recent Labs  Lab 04/01/17 2145 04/03/17 0346  WBC 14.9* 11.1*  NEUTROABS 13.5*  --   HGB 11.1* 9.3*  HCT 34.2* 29.6*  MCV 88.6 88.4  PLT 287 242   Blood Culture    Component Value Date/Time   SDES BLOOD RIGHT WRIST 04/02/2017 0150   SPECREQUEST  04/02/2017 0150    BOTTLES DRAWN AEROBIC AND ANAEROBIC Blood Culture results may not be optimal due to an inadequate volume of blood received in culture bottles   CULT PENDING 04/02/2017 0150   REPTSTATUS PENDING 04/02/2017 0150    Cardiac Enzymes: No results for input(s): CKTOTAL, CKMB, CKMBINDEX, TROPONINI in the last 168 hours. CBG: Recent Labs  Lab 04/01/17 1953 04/01/17 2040 04/02/17 1044 04/02/17 1114  GLUCAP 55* 76 68 106*   Iron Studies: No results for input(s): IRON, TIBC, TRANSFERRIN, FERRITIN in the last 72  hours. @lablastinr3 @ Studies/Results: Dg Chest 1 View  Result Date: 04/01/2017 CLINICAL DATA:  Shortness of breath. EXAM: CHEST 1 VIEW COMPARISON:  Chest x-ray dated February 18, 2017. FINDINGS: Postsurgical changes related to prior CABG. The cardiomediastinal silhouette is at the upper limits of normal in size. Normal pulmonary vascularity. Low lung volumes with minimal bibasilar atelectasis. No focal consolidation, pleural effusion, or pneumothorax. No acute osseous abnormality. IMPRESSION: Persistent low lung volumes.  No active cardiopulmonary disease. Electronically Signed   By: Obie DredgeWilliam T Derry M.D.   On: 04/01/2017 19:17   Dg Foot 2 Views Left  Result Date: 04/01/2017 CLINICAL DATA:  Wound. EXAM: LEFT FOOT - 2 VIEW COMPARISON:  Left foot x-rays dated February 09, 2017. FINDINGS: No acute fracture or malalignment. Moderate the severe first MCP joint degenerative changes, similar to prior study. Soft tissue lucency involving the second through fourth toes, consistent with history of gangrene. No definite cortical destruction. Severe vascular calcifications. IMPRESSION: Soft tissue lucency involving the second through fourth toes, consistent with history of gangrene. No definite radiographic evidence of osteomyelitis. Electronically Signed   By: Obie DredgeWilliam T Derry M.D.   On: 04/01/2017 19:21   Medications: . sodium chloride    . sodium chloride    . piperacillin-tazobactam (ZOSYN)  IV Stopped (04/03/17 0215)  .  vancomycin     . calcitRIOL  0.25 mcg Oral Q M,W,F-HD  . Chlorhexidine Gluconate Cloth  6 each Topical Q0600  . feeding supplement (ENSURE ENLIVE)  237 mL Oral BID BM  . feeding supplement (PRO-STAT SUGAR FREE 64)  30 mL Oral TID  . heparin  5,000 Units Subcutaneous Q8H  . heparin  8,500 Units Dialysis Once in dialysis  . multivitamin  1 tablet Oral QHS  . mupirocin ointment  1 application Nasal BID   Dialysis Orders: MWF [facility] 4h 15min   86kg  Hep 8500  2/2 bath 450/Auto 1.5  L  AVG Parsibiv 7.5 mcg IV TIW (not on hospital formulary) Mircera 100 mcg IV Q 2 weeks (last dose 03/20/17 Last HGB 10.3 03/20/17) Calcitriol 0.25 mcg PO TIW  BMD meds: No binders on OP med list  Assessment/Plan: 1.  Gangrene L Foot: Seen by V[facility]-pt refusing amputation. WBC 11.1 On Vanc per primary. BC pending.  2.  ESRD -  MWF. HD today. K+ 3.7 04/02/17 use 3.0 K bath-usual heparin dose. Drawing labs. 3.  Hypertension/volume  -Pre wt 126 kg. BP stable but on soft side. Attempt UFG 0.5-1 liter.  4.  Anemia  - HGB 9.3. ESA due 04/03/17. Give Aranesp 40 mcg IV with HD today.  5.  Metabolic bone disease -  Renal profile tomorrow with HD. Cont VDRA, unable to give parsibiv while in patient. Add binder after checking Phos.  6.  Nutrition - Albumin 2.7. Change to renal diet/Fld restrictions. Prostat and renal vit. 7.  Sacral decub: WOC consult. CCS consulted. Possible debridement of ulcer next week.  8. Dementia: Per primary. Psych eval requested per primary.  9. GOC: Palliative care consult pending.    [name] NP-C 04/03/2017, 9:16 AM  [facility] [phone]  Pt seen, examined and agree w A/P as above.  Vinson Moselleob Imari Reen MD [facility] pager [phone]   04/03/2017, 2:25 PM

## 2017-04-03 NOTE — Progress Notes (Signed)
[facility] Progress Note     Subjective: CC: sacral wound Patient resting in bed, about to go for dialysis.  VSS.   Objective: Vital signs in last 24 hours: Temp:  [98.5 F (36.9 C)-99.1 F (37.3 C)] 98.5 F (36.9 C) (11/14 0621) Pulse Rate:  [60-88] 88 (11/14 0621) Resp:  [16-20] 20 (11/14 0621) BP: (103-128)/(48-68) 115/68 (11/14 0621) SpO2:  [96 %-100 %] 100 % (11/14 0621) Weight:  [81.1 kg (178 lb 12.7 oz)] 81.1 kg (178 lb 12.7 oz) (11/13 2224) Last BM Date: 04/02/17  Intake/Output from previous day: 11/13 0701 - 11/14 0700 In: 280 [P.O.:180; IV Piggyback:100] Out: 0  Intake/Output this shift: No intake/output data recorded.  PE: Gen:  NAD, cooperative Pulm:  Normal effort Skin: warm and dry, no rashes; sacral wound with large eschar covering most of wound, small area of stage III pressure ulcer at the inferior portion of the wound with tan drainage, malodorous. Stage II ulceration around periphery of wound.  Psych: A&Ox3   Lab Results:  Recent Labs    04/01/17 2145 04/03/17 0346  WBC 14.9* 11.1*  HGB 11.1* 9.3*  HCT 34.2* 29.6*  PLT 287 242   BMET Recent Labs    04/01/17 2145  NA 135  K 3.7  CL 91*  CO2 26  GLUCOSE 90  BUN 40*  CREATININE 9.62*  CALCIUM 8.8*   PT/INR Recent Labs    04/01/17 2145  LABPROT 16.1*  INR 1.30   CMP     Component Value Date/Time   NA 135 04/01/2017 2145   K 3.7 04/01/2017 2145   CL 91 (L) 04/01/2017 2145   CO2 26 04/01/2017 2145   GLUCOSE 90 04/01/2017 2145   BUN 40 (H) 04/01/2017 2145   CREATININE 9.62 (H) 04/01/2017 2145   CALCIUM 8.8 (L) 04/01/2017 2145   CALCIUM 8.4 11/29/2007 1515   PROT 7.1 04/01/2017 2145   ALBUMIN 2.7 (L) 04/01/2017 2145   AST 26 04/01/2017 2145   ALT 18 04/01/2017 2145   ALKPHOS 105 04/01/2017 2145   BILITOT 0.8 04/01/2017 2145   GFRNONAA 4 (L) 04/01/2017 2145   GFRAA 5 (L) 04/01/2017 2145   Lipase  No results found for: LIPASE     Studies/Results: Dg  Chest 1 View  Result Date: 04/01/2017 CLINICAL DATA:  Shortness of breath. EXAM: CHEST 1 VIEW COMPARISON:  Chest x-ray dated February 18, 2017. FINDINGS: Postsurgical changes related to prior CABG. The cardiomediastinal silhouette is at the upper limits of normal in size. Normal pulmonary vascularity. Low lung volumes with minimal bibasilar atelectasis. No focal consolidation, pleural effusion, or pneumothorax. No acute osseous abnormality. IMPRESSION: Persistent low lung volumes.  No active cardiopulmonary disease. Electronically Signed   By: Obie DredgeWilliam T Derry M.D.   On: 04/01/2017 19:17   Dg Foot 2 Views Left  Result Date: 04/01/2017 CLINICAL DATA:  Wound. EXAM: LEFT FOOT - 2 VIEW COMPARISON:  Left foot x-rays dated February 09, 2017. FINDINGS: No acute fracture or malalignment. Moderate the severe first MCP joint degenerative changes, similar to prior study. Soft tissue lucency involving the second through fourth toes, consistent with history of gangrene. No definite cortical destruction. Severe vascular calcifications. IMPRESSION: Soft tissue lucency involving the second through fourth toes, consistent with history of gangrene. No definite radiographic evidence of osteomyelitis. Electronically Signed   By: Obie DredgeWilliam T Derry M.D.   On: 04/01/2017 19:21    Anti-infectives: Anti-infectives (From admission, onward)   Start     Dose/Rate Route Frequency Ordered  Stop   04/03/17 1200  vancomycin (VANCOCIN) IVPB 1000 mg/200 mL premix     1,000 mg 200 mL/hr over 60 Minutes Intravenous Every M-W-F (Hemodialysis) 04/02/17 1328     04/02/17 1400  piperacillin-tazobactam (ZOSYN) IVPB 3.375 g     3.375 g 12.5 mL/hr over 240 Minutes Intravenous Every 12 hours 04/02/17 1328     04/02/17 1200  vancomycin (VANCOCIN) IVPB 1000 mg/200 mL premix  Status:  Discontinued     1,000 mg 200 mL/hr over 60 Minutes Intravenous Every T-Th-Sa (Hemodialysis) 04/02/17 0314 04/02/17 1328   04/02/17 0315  vancomycin (VANCOCIN)  2,000 mg in sodium chloride 0.9 % 500 mL IVPB     2,000 mg 250 mL/hr over 120 Minutes Intravenous  Once 04/02/17 0314 04/02/17 0612   04/01/17 2300  piperacillin-tazobactam (ZOSYN) IVPB 3.375 g     3.375 g 100 mL/hr over 30 Minutes Intravenous  Once 04/01/17 2246 04/02/17 0212       Assessment/Plan Right AKA, and LLE dry gangrene of the 1st - 4th toes ESRD on HD- TTS - scheduled for HD today also HTN Type II diabetes Deconditioning and Malnutrition   Encephalopathy/Dementia - psych consulted and deemed patient unable to make decisions due to lack of capacity Neglect evaluation in progress Palliative evaluation in progress  Unstageable Decubitus ulcer  - likely stage III/IV - eschar present - patient bed bound and unable to mobilize - WBC 11.1, afebrile   FEN: renal diet with fluid restriction VTE: heparin ID: zosyn (11/12>>), vanc now for MWF (11/13>>)   Plan: per psychiatry, patient is unable to consent for surgery himself and would need an authorized representative to do so. Question of whether APS is involved, if so need to know who would be able to consent for patient. Palliative consult also pending. It is likely that this wound may not heal even if debrided due to patient's co-morbidities, poor nutrition and immobility. Will await palliative consult and neglect evaluation prior to making any decisions about surgical intervention.   LOS: 1 day     Wells GuilesKelly Rayburn , [facility] 04/03/2017, 8:09 AM Pager: [phone] Consults: [phone] Mon-Fri 7:00 am-4:30 pm Sat-Sun 7:00 am-11:30 am

## 2017-04-03 NOTE — Progress Notes (Signed)
PROGRESS NOTE  [name] ZOX:[id number] DOB: 1935-07-29 DOA: 04/01/2017 PCP: Crist Fat, MD   LOS: 1 day   Brief Narrative / Interim history: 81 year old male with ESRD, hypertension, right AKA, presented with necrosis of toes on the left feet.  Patient also had been missing dialysis for 1 week, was sent by HD center due to worsening necrosis.  Assessment & Plan: Principal Problem:   Evaluation by psychiatric service required Active Problems:   End stage renal disease (HCC)   Gangrene of left foot (HCC)   Cellulitis of left foot   Dementia   DNR (do not resuscitate) discussion   Palliative care by specialist   Left toes necrosis/gangrene / PVD -Leukocytosis, afebrile -Patient currently on vancomycin and Zosyn -Dr. Isidoro Donning discussed with vascular surgery, Dr. Randie Heinz (who knows the patient), evaluated the patient and recommended palliative care for goals of care, patient has refused amputation of the left foot multiple times, was recommended 2 weeks ago -Per vascular surgery, no need for emergent surgery unless patient agrees or septic/medically indicated -Palliative care consulted  Dementia -Psychiatry consulted, patient does not have capacity to make medical decisions  ESRD - nephrology consulted  Sacral decubitus ulcer  -Surgery consulted, will need surgery later the this week.  Social issues will need to be addressed prior   DVT prophylaxis: heparin Code Status: Full code Family Communication: no family at bedside Disposition Plan: TBD  Consultants:   Palliative  Nephrology  Psychiatry   Procedures:   None   Antimicrobials:  Vancomycin 11/12 >>   Zosyn 11/12 >>  Subjective: - no chest pain, shortness of breath, no abdominal pain, nausea or vomiting.  Alert to person only  Objective: Vitals:   04/03/17 0621 04/03/17 0915 04/03/17 0921 04/03/17 0930  BP: 115/68 (!) 117/49 (!) 107/46 (!) 100/48  Pulse: 88 80 74 74  Resp: 20  18   Temp: 98.5 F  (36.9 C)  98.4 F (36.9 C)   TempSrc: Oral  Oral   SpO2: 100%  100%   Weight:   126 kg (277 lb 12.5 oz)   Height:        Intake/Output Summary (Last 24 hours) at 04/03/2017 0950 Last data filed at 04/03/2017 8119 Gross per 24 hour  Intake 280 ml  Output 0 ml  Net 280 ml   Filed Weights   04/02/17 0332 04/02/17 2224 04/03/17 0921  Weight: 81.2 kg (179 lb) 81.1 kg (178 lb 12.7 oz) 126 kg (277 lb 12.5 oz)    Examination:  Constitutional: NAD Eyes:  lids and conjunctivae normal Respiratory: clear to auscultation bilaterally, no wheezing, no crackles.  Cardiovascular: Regular rate and rhythm, no murmurs / rubs / gallops. No LE edema.  Abdomen: no tenderness. Bowel sounds positive.  Musculoskeletal: no clubbing / cyanosis.  Skin: large sacral decubitus ulcer, eschar present Neurologic: non focal  Psychiatric: Normal judgment and insight. Alert and oriented x 3. Normal mood.    Data Reviewed: I have independently reviewed following labs and imaging studies   CBC: Recent Labs  Lab 04/01/17 2145 04/03/17 0346  WBC 14.9* 11.1*  NEUTROABS 13.5*  --   HGB 11.1* 9.3*  HCT 34.2* 29.6*  MCV 88.6 88.4  PLT 287 242   Basic Metabolic Panel: Recent Labs  Lab 04/01/17 2145  NA 135  K 3.7  CL 91*  CO2 26  GLUCOSE 90  BUN 40*  CREATININE 9.62*  CALCIUM 8.8*   GFR: Estimated Creatinine Clearance: 8.3 mL/min (A) (by C-G  formula based on SCr of 9.62 mg/dL (H)). Liver Function Tests: Recent Labs  Lab 04/01/17 2145  AST 26  ALT 18  ALKPHOS 105  BILITOT 0.8  PROT 7.1  ALBUMIN 2.7*   No results for input(s): LIPASE, AMYLASE in the last 168 hours. No results for input(s): AMMONIA in the last 168 hours. Coagulation Profile: Recent Labs  Lab 04/01/17 2145  INR 1.30   Cardiac Enzymes: No results for input(s): CKTOTAL, CKMB, CKMBINDEX, TROPONINI in the last 168 hours. BNP (last 3 results) No results for input(s): PROBNP in the last 8760 hours. HbA1C: No results  for input(s): HGBA1C in the last 72 hours. CBG: Recent Labs  Lab 04/01/17 1953 04/01/17 2040 04/02/17 1044 04/02/17 1114  GLUCAP 55* 76 68 106*   Lipid Profile: No results for input(s): CHOL, HDL, LDLCALC, TRIG, CHOLHDL, LDLDIRECT in the last 72 hours. Thyroid Function Tests: No results for input(s): TSH, T4TOTAL, FREET4, T3FREE, THYROIDAB in the last 72 hours. Anemia Panel: No results for input(s): VITAMINB12, FOLATE, FERRITIN, TIBC, IRON, RETICCTPCT in the last 72 hours. Urine analysis:    Component Value Date/Time   COLORURINE YELLOW 12/26/2016 0024   APPEARANCEUR CLOUDY (A) 12/26/2016 0024   LABSPEC 1.016 12/26/2016 0024   PHURINE 8.0 12/26/2016 0024   GLUCOSEU NEGATIVE 12/26/2016 0024   HGBUR SMALL (A) 12/26/2016 0024   BILIRUBINUR NEGATIVE 12/26/2016 0024   KETONESUR NEGATIVE 12/26/2016 0024   PROTEINUR 100 (A) 12/26/2016 0024   UROBILINOGEN 0.2 03/08/2011 0253   NITRITE NEGATIVE 12/26/2016 0024   LEUKOCYTESUR LARGE (A) 12/26/2016 0024   Sepsis Labs: Invalid input(s): PROCALCITONIN, LACTICIDVEN  Recent Results (from the past 240 hour(s))  Culture, blood (routine x 2)     Status: None (Preliminary result)   Collection Time: 04/01/17  9:45 PM  Result Value Ref Range Status   Specimen Description BLOOD RIGHT HAND  Final   Special Requests IN PEDIATRIC BOTTLE Blood Culture adequate volume  Final   Culture NO GROWTH < 24 HOURS  Final   Report Status PENDING  Incomplete  Culture, blood (routine x 2)     Status: None (Preliminary result)   Collection Time: 04/02/17  1:50 AM  Result Value Ref Range Status   Specimen Description BLOOD RIGHT WRIST  Final   Special Requests   Final    BOTTLES DRAWN AEROBIC AND ANAEROBIC Blood Culture results may not be optimal due to an inadequate volume of blood received in culture bottles   Culture PENDING  Incomplete   Report Status PENDING  Incomplete  MRSA PCR Screening     Status: Abnormal   Collection Time: 04/02/17  3:39 AM    Result Value Ref Range Status   MRSA by PCR POSITIVE (A) NEGATIVE Final    Comment:        The GeneXpert MRSA Assay (FDA approved for NASAL specimens only), is one component of a comprehensive MRSA colonization surveillance program. It is not intended to diagnose MRSA infection nor to guide or monitor treatment for MRSA infections. RESULT CALLED TO, READ BACK BY AND VERIFIED WITHAngelina Sheriff: BOKIAGON,C RN [phone] 04/02/17 MITCHELL,L       Radiology Studies: Dg Chest 1 View  Result Date: 04/01/2017 CLINICAL DATA:  Shortness of breath. EXAM: CHEST 1 VIEW COMPARISON:  Chest x-ray dated February 18, 2017. FINDINGS: Postsurgical changes related to prior CABG. The cardiomediastinal silhouette is at the upper limits of normal in size. Normal pulmonary vascularity. Low lung volumes with minimal bibasilar atelectasis. No focal consolidation, pleural effusion, or  pneumothorax. No acute osseous abnormality. IMPRESSION: Persistent low lung volumes.  No active cardiopulmonary disease. Electronically Signed   By: Obie DredgeWilliam T Derry M.D.   On: 04/01/2017 19:17   Dg Foot 2 Views Left  Result Date: 04/01/2017 CLINICAL DATA:  Wound. EXAM: LEFT FOOT - 2 VIEW COMPARISON:  Left foot x-rays dated February 09, 2017. FINDINGS: No acute fracture or malalignment. Moderate the severe first MCP joint degenerative changes, similar to prior study. Soft tissue lucency involving the second through fourth toes, consistent with history of gangrene. No definite cortical destruction. Severe vascular calcifications. IMPRESSION: Soft tissue lucency involving the second through fourth toes, consistent with history of gangrene. No definite radiographic evidence of osteomyelitis. Electronically Signed   By: Obie DredgeWilliam T Derry M.D.   On: 04/01/2017 19:21     Scheduled Meds: . calcitRIOL  0.25 mcg Oral Q M,W,F-HD  . Chlorhexidine Gluconate Cloth  6 each Topical Q0600  . feeding supplement (ENSURE ENLIVE)  237 mL Oral BID BM  . feeding  supplement (PRO-STAT SUGAR FREE 64)  30 mL Oral TID  . heparin  5,000 Units Subcutaneous Q8H  . heparin  8,500 Units Dialysis Once in dialysis  . multivitamin  1 tablet Oral QHS  . mupirocin ointment  1 application Nasal BID   Continuous Infusions: . sodium chloride    . sodium chloride    . piperacillin-tazobactam (ZOSYN)  IV Stopped (04/03/17 0215)  . vancomycin       Pamella Pertostin Genni Buske, MD, PhD [facility] Pager [phone]  If 7PM-7AM, please contact night-coverage [email or web address] Password [email or web address] 04/03/2017, 9:50 AM

## 2017-04-03 NOTE — Consult Note (Signed)
WOC follow-up: Surgical team is now following for assessment and plan of care for sacrum pressure injury.  If aggressive plan of care is decided upon for left foot, please refer to Vascular team who has recommended amputation. Please re-consult if further assistance is needed.  Thank-you,  Cammie Mcgeeawn Kandon Hosking MSN, RN, CWOCN, LatexoWCN-AP, CNS [phone]

## 2017-04-03 NOTE — Progress Notes (Signed)
Patient ID: [name], male   DOB: 08/01/1935, 81 y.o.   MRN: [id number]  This NP visited patient at the bedside as a follow up to  yesterday's GOCs meeting.  Patient remains's confused with little insight into current medical situation.  Placed call to son and stressed importance of family coming together to make decisions regarding overall plan of care.  Family meeting planned for tomorrow at 10:00, discussed with SW and she will be present.   Informed Dr Elvera LennoxGherghe of meeting time.  Discussed with family the importance of continued conversation with family and their  medical providers regarding overall plan of care and treatment options,  ensuring decisions are within the context of the patients values and GOCs.  Questions and concerns addressed  Time in 1030          Time out    1050  Total time spent on the unit was 20 min  Greater than 50% of the time was spent in counseling and coordination of care  Lorinda CreedMary Capri Raben NP  Palliative Medicine Team Team Phone # [phone] Pager [phone]

## 2017-04-03 NOTE — Progress Notes (Signed)
Initial Nutrition Assessment  DOCUMENTATION CODES:   Morbid obesity  INTERVENTION:    Continue Ensure Enlive po BID, each supplement provides 350 kcal and 20 grams of protein   Continue Prostat liquid protein po 30 ml TID with meals, each supplement provides 100 kcal, 15 grams protein  NUTRITION DIAGNOSIS:   Increased nutrient needs related to chronic illness, wound healing as evidenced by estimated needs  GOAL:   Patient will meet greater than or equal to 90% of their needs  MONITOR:   PO intake, Supplement acceptance, Labs, Weight trends, Skin, I & O's  REASON FOR ASSESSMENT:   Malnutrition Screening Tool  ASSESSMENT:   81 yo Male with ESRD, hypertension, right AKA, presented with necrosis of toes on the left feet.  Patient also had been missing dialysis for 1 week, was sent by HD center due to worsening necrosis.  Pt currently in HEMODIALYSIS. He is bedbound at baseline. Per malnutrition screening, pt has been eating poorly due to a decreased appetite. CWOCN note 11/13 reviewed. Pt with several wounds and DTI.  Needs debridement of the sacral decubitus per Surgery. Medications reviewed and include Rena-Vit and ABX. Labs reviewed. Na 134 (L). CBG's [id number].  NUTRITION - FOCUSED PHYSICAL EXAM:    Most Recent Value  Orbital Region  Unable to assess  Upper Arm Region  Unable to assess  Thoracic and Lumbar Region  Unable to assess  Buccal Region  Unable to assess  Temple Region  Unable to assess  Clavicle Bone Region  Unable to assess  Clavicle and Acromion Bone Region  Unable to assess  Scapular Bone Region  Unable to assess  Dorsal Hand  Unable to assess  Patellar Region  Unable to assess  Anterior Thigh Region  Unable to assess  Posterior Calf Region  Unable to assess  Edema (RD Assessment)  Unable to assess     Diet Order:  Diet renal with fluid restriction Fluid restriction: 1200 mL Fluid; Room service appropriate? Yes; Fluid consistency:  Thin  EDUCATION NEEDS:   No education needs have been identified at this time  Skin:  Skin Assessment: Skin Integrity Issues: Skin Integrity Issues:: Stage II, Unstageable, Stage III, Other (Comment) Stage II: buttock Stage III: coccyx Unstageable: sacrum Other: gangernous toes for L foot  Last BM:  11/13  Height:   Ht Readings from Last 1 Encounters:  04/01/17 5\' 10"  (1.778 m)   Weight:   Wt Readings from Last 1 Encounters:  04/03/17 277 lb 12.5 oz (126 kg)   BMI:  43.8 kg/m2 (adjusted for R AKA)  Estimated Nutritional Needs:   Kcal:  2250-2450  Protein:  122-162 gm  Fluid:  1200 ml  Maureen ChattersKatie Trayce Caravello, RD, LDN Pager #: [phone] After-Hours Pager #: [phone]

## 2017-04-04 DIAGNOSIS — S31000D Unspecified open wound of lower back and pelvis without penetration into retroperitoneum, subsequent encounter: Secondary | ICD-10-CM

## 2017-04-04 LAB — CBC
HCT: 30.5 % — ABNORMAL LOW (ref 39.0–52.0)
Hemoglobin: 9.4 g/dL — ABNORMAL LOW (ref 13.0–17.0)
MCH: 27.7 pg (ref 26.0–34.0)
MCHC: 30.8 g/dL (ref 30.0–36.0)
MCV: 90 fL (ref 78.0–100.0)
PLATELETS: 279 10*3/uL (ref 150–400)
RBC: 3.39 MIL/uL — ABNORMAL LOW (ref 4.22–5.81)
RDW: 16.8 % — AB (ref 11.5–15.5)
WBC: 12.2 10*3/uL — ABNORMAL HIGH (ref 4.0–10.5)

## 2017-04-04 LAB — BASIC METABOLIC PANEL
ANION GAP: 11 (ref 5–15)
BUN: 26 mg/dL — AB (ref 6–20)
CALCIUM: 8.4 mg/dL — AB (ref 8.9–10.3)
CO2: 28 mmol/L (ref 22–32)
CREATININE: 5.8 mg/dL — AB (ref 0.61–1.24)
Chloride: 98 mmol/L — ABNORMAL LOW (ref 101–111)
GFR calc Af Amer: 10 mL/min — ABNORMAL LOW (ref >60)
GFR calc non Af Amer: 8 mL/min — ABNORMAL LOW (ref >60)
GLUCOSE: 90 mg/dL (ref 65–99)
POTASSIUM: 3.6 mmol/L (ref 3.5–5.1)
SODIUM: 137 mmol/L (ref 135–145)

## 2017-04-04 MED ORDER — FENTANYL CITRATE (PF) 100 MCG/2ML IJ SOLN
50.0000 ug | INTRAMUSCULAR | Status: DC | PRN
Start: 2017-04-04 — End: 2017-04-09
  Administered 2017-04-05 – 2017-04-09 (×11): 50 ug via INTRAVENOUS
  Filled 2017-04-04 (×8): qty 2

## 2017-04-04 NOTE — Progress Notes (Signed)
Patient ID: [name], male   DOB: 03/04/1936, 81 y.o.   MRN: [id number]  This NP visited patient at the bedside with family (wife and son), scheduled meeting with attending, SW to continue discussion regarding diagnosis, prognosis, treatment options and anticipatory care needs.  Detailed the difference between an aggressive medical intervention path and a palliative comfort approach as it relates to recommended amputation, significant sacral wound, continuation of dialysis, overall failure to thrive/poor nutrition and ongoing nursing care needs.   Family has made decision to move forard with amputation and sacral wound debridement.  They will take one day at a time and remain hopeful for improvement.   Decision for DNR/DNI made and docuemnted  Discussed with family the importance of continued conversation with patient /as he is able and their  medical providers regarding overall plan of care and treatment options,  ensuring decisions are within the context of the patients values and GOCs.  Questions and concerns addressed   PMT will continue to support holistically.  Discussed with Dr Elvera LennoxGherghe  Time in  1000         Time out   1100 Total time spent on the unit was 60 minutes   Greater than 50% of the time was spent in counseling and coordination of care  Lorinda CreedMary Larach NP  Palliative Medicine Team Team Phone # [phone] Pager [phone]

## 2017-04-04 NOTE — Progress Notes (Signed)
[facility] Progress Note     Subjective: CC: decubitus ulcer Patient resting comfortably in bed, no family present at bedside. No complaints. Feels like he needs to have a BM. VSS.   Objective: Vital signs in last 24 hours: Temp:  [97.6 F (36.4 C)-98.9 F (37.2 C)] 98.5 F (36.9 C) (11/15 0900) Pulse Rate:  [70-88] 83 (11/15 0900) Resp:  [13-20] 18 (11/15 0900) BP: (106-134)/(43-60) 128/51 (11/15 0900) SpO2:  [97 %-100 %] 97 % (11/15 0900) Weight:  [125 kg (275 lb 9.2 oz)-125.2 kg (276 lb 0.3 oz)] 125.2 kg (276 lb 0.3 oz) (11/14 2206) Last BM Date: 04/03/17  Intake/Output from previous day: 11/14 0701 - 11/15 0700 In: 340 [P.O.:240; IV Piggyback:100] Out: 1002 [Stool:2] Intake/Output this shift: No intake/output data recorded.  PE: Gen: NAD, cooperative Pulm:  Normal effort, clear to auscultation bilaterally Skin: warm and dry, no rashes; sacral wound with large eschar covering most of wound, small area of stage III pressure ulcer at the inferior portion of the wound with tan drainage, malodorous. Stage II ulceration around periphery of wound.  Psych: A&Ox3   Lab Results:  Recent Labs    04/03/17 0346 04/04/17 0542  WBC 11.1* 12.2*  HGB 9.3* 9.4*  HCT 29.6* 30.5*  PLT 242 279   BMET Recent Labs    04/03/17 0900 04/04/17 0542  NA 134* 137  K 4.0 3.6  CL 93* 98*  CO2 27 28  GLUCOSE 83 90  BUN 53* 26*  CREATININE 11.05* 5.80*  CALCIUM 8.5* 8.4*   PT/INR Recent Labs    04/01/17 2145  LABPROT 16.1*  INR 1.30   CMP     Component Value Date/Time   NA 137 04/04/2017 0542   K 3.6 04/04/2017 0542   CL 98 (L) 04/04/2017 0542   CO2 28 04/04/2017 0542   GLUCOSE 90 04/04/2017 0542   BUN 26 (H) 04/04/2017 0542   CREATININE 5.80 (H) 04/04/2017 0542   CALCIUM 8.4 (L) 04/04/2017 0542   CALCIUM 8.4 11/29/2007 1515   PROT 7.1 04/01/2017 2145   ALBUMIN 2.1 (L) 04/03/2017 0900   AST 26 04/01/2017 2145   ALT 18 04/01/2017 2145   ALKPHOS 105  04/01/2017 2145   BILITOT 0.8 04/01/2017 2145   GFRNONAA 8 (L) 04/04/2017 0542   GFRAA 10 (L) 04/04/2017 0542   Lipase  No results found for: LIPASE     Studies/Results: No results found.  Anti-infectives: Anti-infectives (From admission, onward)   Start     Dose/Rate Route Frequency Ordered Stop   04/03/17 1200  vancomycin (VANCOCIN) IVPB 1000 mg/200 mL premix     1,000 mg 200 mL/hr over 60 Minutes Intravenous Every M-W-F (Hemodialysis) 04/02/17 1328     04/03/17 1002  vancomycin (VANCOCIN) 1-5 GM/200ML-% IVPB    Comments:  Carlyon ProwsZhao, Xiaobo   : cabinet override      04/03/17 1002 04/03/17 1135   04/02/17 1400  piperacillin-tazobactam (ZOSYN) IVPB 3.375 g     3.375 g 12.5 mL/hr over 240 Minutes Intravenous Every 12 hours 04/02/17 1328     04/02/17 1200  vancomycin (VANCOCIN) IVPB 1000 mg/200 mL premix  Status:  Discontinued     1,000 mg 200 mL/hr over 60 Minutes Intravenous Every T-Th-Sa (Hemodialysis) 04/02/17 0314 04/02/17 1328   04/02/17 0315  vancomycin (VANCOCIN) 2,000 mg in sodium chloride 0.9 % 500 mL IVPB     2,000 mg 250 mL/hr over 120 Minutes Intravenous  Once 04/02/17 0314 04/02/17 0612   04/01/17  2300  piperacillin-tazobactam (ZOSYN) IVPB 3.375 g     3.375 g 100 mL/hr over 30 Minutes Intravenous  Once 04/01/17 2246 04/02/17 0212       Assessment/Plan Right AKA, and LLE dry gangrene of the 1st - 4th toes ESRD on HD- TTS - scheduled for HD today also HTN Type II diabetes Deconditioning and Malnutrition   Encephalopathy/Dementia - psych consulted and deemed patient unable to make decisions due to lack of capacity Neglect evaluation in progress Palliative evaluation in progress  Unstageable Decubitus ulcer  - likely stage III/IV - eschar present - patient bed bound and unable to mobilize - WBC 12.2, afebrile  - wound not likely to heal well even with surgical debridement  FEN: renal diet with fluid restriction VTE: heparin ID: zosyn (11/12>>), vanc now  for MWF (11/13>>)   Plan: Per GOC meeting today, family would like to pursue surgical debridement. Will try to coordinate with vascular surgery so that patient is only going to the OR one time.     LOS: 2 days    Wells GuilesKelly Rayburn , [facility] 04/04/2017, 12:28 PM Pager: [phone] Consults: [phone] Mon-Fri 7:00 am-4:30 pm Sat-Sun 7:00 am-11:30 am

## 2017-04-04 NOTE — Progress Notes (Signed)
PROGRESS NOTE  [name] ONG:[id number] DOB: 04/19/1936 DOA: 04/01/2017 PCP: Crist FatVan Eyk, Jason, MD   LOS: 2 days   Brief Narrative / Interim history: 81 year old male with ESRD, hypertension, right AKA, presented with necrosis of toes on the left feet.  Patient also had been missing dialysis for 1 week, was sent by HD center due to worsening necrosis.  Assessment & Plan: Principal Problem:   Evaluation by psychiatric service required Active Problems:   End stage renal disease (HCC)   Gangrene of left foot (HCC)   Cellulitis of left foot   Dementia   DNR (do not resuscitate) discussion   Palliative care by specialist   Goals of care -Extended discussion today with patient's wife as well as children, along with palliative care as well as social worker being present.  Discussed about options going forward, patient's current medical problems and limitations given underlying dementia, necrotic toes as well as a significant sacral decubitus ulcer.  They feel like the patient's wishes would be to do everything possible for his toes as well as his sacral wound, including surgical approach if needed.  We discussed CODE STATUS, and that has been changed to DNR at their wishes.  Left toes necrosis/gangrene / PVD -Patient is afebrile, mild leukocytosis -Currently on vancomycin and Zosyn, continue, consulted vascular surgery to evaluate for amputation as his toes are unlikely to heal with conservative management  Dementia -Psychiatry consulted, patient does not have capacity to make medical decisions  ESRD - nephrology consulted  Sacral decubitus ulcer  -Surgery consulted, will need debridement surgery, to coordinate with vascular surgery hopefully OR time only once   DVT prophylaxis: heparin Code Status: Full code Family Communication: Discussed with patient's wife and 2 adult children at bedside Disposition Plan: TBD  Consultants:   Palliative  Nephrology  Psychiatry    Procedures:   None   Antimicrobials:  Vancomycin 11/12 >>   Zosyn 11/12 >>  Subjective: -Complains of left foot pain, wants this "taking care of".  Appears to be agreeable to amputation this morning to help with the pain  Objective: Vitals:   04/03/17 1745 04/03/17 2206 04/04/17 0453 04/04/17 0900  BP: (!) 110/56 (!) 134/43 (!) 125/53 (!) 128/51  Pulse: 80 84 88 83  Resp: 18 19 20 18   Temp: 97.6 F (36.4 C) 98.4 F (36.9 C) 98.9 F (37.2 C) 98.5 F (36.9 C)  TempSrc: Oral Oral Oral Oral  SpO2: 100% 100% 100% 97%  Weight:  125.2 kg (276 lb 0.3 oz)    Height:        Intake/Output Summary (Last 24 hours) at 04/04/2017 1200 Last data filed at 04/04/2017 0617 Gross per 24 hour  Intake 340 ml  Output 1002 ml  Net -662 ml   Filed Weights   04/03/17 0921 04/03/17 1306 04/03/17 2206  Weight: 126 kg (277 lb 12.5 oz) 125 kg (275 lb 9.2 oz) 125.2 kg (276 lb 0.3 oz)    Examination:  Constitutional: NAD Eyes: No scleral icterus Respiratory: Clear, no wheezing Cardiovascular: Regular rate and rhythm, no murmurs, no edema Abdomen: Soft, nontender Skin: large sacral decubitus ulcer, eschar present, left toes 2, 3 and 4 black, necrotic, extending to the dorsum of the foot Neurologic: non focal  Psychiatric: Confused, does not answer orientation questions   Data Reviewed: I have independently reviewed following labs and imaging studies   CBC: Recent Labs  Lab 04/01/17 2145 04/03/17 0346 04/04/17 0542  WBC 14.9* 11.1* 12.2*  NEUTROABS 13.5*  --   --  HGB 11.1* 9.3* 9.4*  HCT 34.2* 29.6* 30.5*  MCV 88.6 88.4 90.0  PLT 287 242 279   Basic Metabolic Panel: Recent Labs  Lab 04/01/17 2145 04/03/17 0900 04/04/17 0542  NA 135 134* 137  K 3.7 4.0 3.6  CL 91* 93* 98*  CO2 26 27 28   GLUCOSE 90 83 90  BUN 40* 53* 26*  CREATININE 9.62* 11.05* 5.80*  CALCIUM 8.8* 8.5* 8.4*  PHOS  --  6.9*  --    GFR: Estimated Creatinine Clearance: 13.7 mL/min (A) (by C-G  formula based on SCr of 5.8 mg/dL (H)). Liver Function Tests: Recent Labs  Lab 04/01/17 2145 04/03/17 0900  AST 26  --   ALT 18  --   ALKPHOS 105  --   BILITOT 0.8  --   PROT 7.1  --   ALBUMIN 2.7* 2.1*   No results for input(s): LIPASE, AMYLASE in the last 168 hours. No results for input(s): AMMONIA in the last 168 hours. Coagulation Profile: Recent Labs  Lab 04/01/17 2145  INR 1.30   Cardiac Enzymes: No results for input(s): CKTOTAL, CKMB, CKMBINDEX, TROPONINI in the last 168 hours. BNP (last 3 results) No results for input(s): PROBNP in the last 8760 hours. HbA1C: No results for input(s): HGBA1C in the last 72 hours. CBG: Recent Labs  Lab 04/01/17 1953 04/01/17 2040 04/02/17 1044 04/02/17 1114  GLUCAP 55* 76 68 106*   Lipid Profile: No results for input(s): CHOL, HDL, LDLCALC, TRIG, CHOLHDL, LDLDIRECT in the last 72 hours. Thyroid Function Tests: No results for input(s): TSH, T4TOTAL, FREET4, T3FREE, THYROIDAB in the last 72 hours. Anemia Panel: No results for input(s): VITAMINB12, FOLATE, FERRITIN, TIBC, IRON, RETICCTPCT in the last 72 hours. Urine analysis:    Component Value Date/Time   COLORURINE YELLOW 12/26/2016 0024   APPEARANCEUR CLOUDY (A) 12/26/2016 0024   LABSPEC 1.016 12/26/2016 0024   PHURINE 8.0 12/26/2016 0024   GLUCOSEU NEGATIVE 12/26/2016 0024   HGBUR SMALL (A) 12/26/2016 0024   BILIRUBINUR NEGATIVE 12/26/2016 0024   KETONESUR NEGATIVE 12/26/2016 0024   PROTEINUR 100 (A) 12/26/2016 0024   UROBILINOGEN 0.2 03/08/2011 0253   NITRITE NEGATIVE 12/26/2016 0024   LEUKOCYTESUR LARGE (A) 12/26/2016 0024   Sepsis Labs: Invalid input(s): PROCALCITONIN, LACTICIDVEN  Recent Results (from the past 240 hour(s))  Culture, blood (routine x 2)     Status: None (Preliminary result)   Collection Time: 04/01/17  9:45 PM  Result Value Ref Range Status   Specimen Description BLOOD RIGHT HAND  Final   Special Requests IN PEDIATRIC BOTTLE Blood Culture  adequate volume  Final   Culture NO GROWTH 2 DAYS  Final   Report Status PENDING  Incomplete  Culture, blood (routine x 2)     Status: None (Preliminary result)   Collection Time: 04/02/17  1:50 AM  Result Value Ref Range Status   Specimen Description BLOOD RIGHT WRIST  Final   Special Requests   Final    BOTTLES DRAWN AEROBIC AND ANAEROBIC Blood Culture results may not be optimal due to an inadequate volume of blood received in culture bottles   Culture NO GROWTH 1 DAY  Final   Report Status PENDING  Incomplete  MRSA PCR Screening     Status: Abnormal   Collection Time: 04/02/17  3:39 AM  Result Value Ref Range Status   MRSA by PCR POSITIVE (A) NEGATIVE Final    Comment:        The GeneXpert MRSA Assay (FDA approved for  NASAL specimens only), is one component of a comprehensive MRSA colonization surveillance program. It is not intended to diagnose MRSA infection nor to guide or monitor treatment for MRSA infections. RESULT CALLED TO, READ BACK BY AND VERIFIED WITHAngelina Sheriff RN [id number] 04/02/17 MITCHELL,L       Radiology Studies: No results found.   Scheduled Meds: . calcitRIOL  0.25 mcg Oral Q M,W,F-HD  . Chlorhexidine Gluconate Cloth  6 each Topical Q0600  . feeding supplement (ENSURE ENLIVE)  237 mL Oral BID BM  . feeding supplement (PRO-STAT SUGAR FREE 64)  30 mL Oral TID  . heparin  5,000 Units Subcutaneous Q8H  . multivitamin  1 tablet Oral QHS  . mupirocin ointment  1 application Nasal BID   Continuous Infusions: . piperacillin-tazobactam (ZOSYN)  IV 3.375 g (04/04/17 0907)  . vancomycin 1,000 mg (04/03/17 1135)    Time spent: 50 minutes, 40 minutes in the room involved in goals of care discussion as above  Pamella Pert, MD, PhD [facility] Pager [phone]  If 7PM-7AM, please contact night-coverage [email or web address] Password [email or web address] 04/04/2017, 12:00 PM

## 2017-04-04 NOTE — Clinical Social Work Note (Signed)
CSW, along with Dr. Elvera LennoxGherghe,  attending MD and Palliative Care NP Lorinda CreedMary Larach talked with patient's family at the bedside. Present were patient's wife Tyler AasDoris, son Broadus JohnWarren, and family member Tammy. Patient's medical condition and needs while in hospital discussed, along with concerns about long-term medical stability and quality of life. CSW also talked with family about patient's ability post discharge to sit for dialysis transport and HD sessions 3 times a week,and this was discussed. After discussion about life-saving measures, family decided to make patient a DNR.   CSW will continue to follow and provide CSW intervention services as needed.

## 2017-04-04 NOTE — Progress Notes (Signed)
RN noticed that patient has been spitting brown residue in a styrofoam cup. RN asked patient what it was and patient refused to answer. This am, this RN and NT found a tobacco container in patients pillow case. RN advised patient about hospital policy and informed him that it will be held in his personal belongings. Patient grew upset and requested to give the container to wife himself. RN took container and assured patient that it will be kept safe until discharge.  RN will put in patients daily bin and will notify oncoming RN.

## 2017-04-04 NOTE — Progress Notes (Signed)
[facility] Progress Note   Subjective: No C/Os.   Objective Vitals:   04/03/17 1745 04/03/17 2206 04/04/17 0453 04/04/17 0900  BP: (!) 110/56 (!) 134/43 (!) 125/53 (!) 128/51  Pulse: 80 84 88 83  Resp: 18 19 20 18   Temp: 97.6 F (36.4 C) 98.4 F (36.9 C) 98.9 F (37.2 C) 98.5 F (36.9 C)  TempSrc: Oral Oral Oral Oral  SpO2: 100% 100% 100% 97%  Weight:  125.2 kg (276 lb 0.3 oz)    Height:       Physical Exam General: Chronically ill appearing male in NAD Heart: S1,S2 RRR Lungs: CTAB A/P Abdomen: Active BS  Extremities: R AKA. Evidence of dry with wet gangrene 2-4th L toes. Edema and demarcation L foot.  Dialysis Access: LFA AVG cannulated at present.    Additional Objective Labs: Basic Metabolic Panel: Recent Labs  Lab 04/01/17 2145 04/03/17 0900 04/04/17 0542  NA 135 134* 137  K 3.7 4.0 3.6  CL 91* 93* 98*  CO2 26 27 28   GLUCOSE 90 83 90  BUN 40* 53* 26*  CREATININE 9.62* 11.05* 5.80*  CALCIUM 8.8* 8.5* 8.4*  PHOS  --  6.9*  --    Liver Function Tests: Recent Labs  Lab 04/01/17 2145 04/03/17 0900  AST 26  --   ALT 18  --   ALKPHOS 105  --   BILITOT 0.8  --   PROT 7.1  --   ALBUMIN 2.7* 2.1*   No results for input(s): LIPASE, AMYLASE in the last 168 hours. CBC: Recent Labs  Lab 04/01/17 2145 04/03/17 0346 04/04/17 0542  WBC 14.9* 11.1* 12.2*  NEUTROABS 13.5*  --   --   HGB 11.1* 9.3* 9.4*  HCT 34.2* 29.6* 30.5*  MCV 88.6 88.4 90.0  PLT 287 242 279   Blood Culture    Component Value Date/Time   SDES BLOOD RIGHT WRIST 04/02/2017 0150   SPECREQUEST  04/02/2017 0150    BOTTLES DRAWN AEROBIC AND ANAEROBIC Blood Culture results may not be optimal due to an inadequate volume of blood received in culture bottles   CULT NO GROWTH 2 DAYS 04/02/2017 0150   REPTSTATUS PENDING 04/02/2017 0150    Cardiac Enzymes: No results for input(s): CKTOTAL, CKMB, CKMBINDEX, TROPONINI in the last 168 hours. CBG: Recent Labs  Lab  04/01/17 1953 04/01/17 2040 04/02/17 1044 04/02/17 1114  GLUCAP 55* 76 68 106*   Iron Studies: No results for input(s): IRON, TIBC, TRANSFERRIN, FERRITIN in the last 72 hours. @lablastinr3 @ Studies/Results: No results found. Medications: . piperacillin-tazobactam (ZOSYN)  IV Stopped (04/04/17 1307)  . vancomycin 1,000 mg (04/03/17 1135)   . calcitRIOL  0.25 mcg Oral Q M,W,F-HD  . Chlorhexidine Gluconate Cloth  6 each Topical Q0600  . feeding supplement (ENSURE ENLIVE)  237 mL Oral BID BM  . feeding supplement (PRO-STAT SUGAR FREE 64)  30 mL Oral TID  . heparin  5,000 Units Subcutaneous Q8H  . multivitamin  1 tablet Oral QHS  . mupirocin ointment  1 application Nasal BID   Dialysis Orders: MWF [facility] 4h <MEASUREMEN8DoArvChe95.6[facility] 8DoArvChe95.6[facility] LW 8DoArvChe95.6[facility] 8DoArvChe95.6[facility] 8DoArvChe95.6[facility] LW 8DoArvChe95.6[facility] 8DoArvChe95.6[facility] 8DoArvChe95.6[facility] 8DoArvChe95.6[facility] 8DoArvChe95.6[facility] 8DoArvChe95.6[facility] 8DoArvChe95.6[facility] 8DoArvChe95.6[facility] 8DoArvChe95.6[facility] 8DoArvChe95.6[facility] 8DoArvChe95.6[facility] W 8DoArvChe95.6[facility] IW 8DoArvChe95.6[facility] 8DoArvChe95.6[facility] 8DoArvChe95.6[facility] 8DoArvChe95.6[facility] LWashinYahoo! Incp 8500  2/2 bath 450/Auto 1.5  L AVG Parsibiv 7.5 mcg IV TIW (not on hospital formulary) Mircera 100 mcg IV Q 2 weeks (last dose 03/20/17 Last HGB 10.3 03/20/17) Calcitriol 0.25 mcg PO TIW  BMD meds: No binders on OP med list  Assessment: 1. Gangrene L Foot / necrotic sacral decub: pt not competent to make decision. Family wants  surgery as needed. Made DNR now.  2. ESRD -  MWF. HD Friday 3. Anemia  - HGB 9.3. ESA due 04/03/17. Give Aranesp 40 mcg IV with HD today.  4. Metabolic bone disease -  Renal profile tomorrow with HD. Cont VDRA, unable to give parsibiv while in patient. Add binder after checking Phos.  5. Nutrition - Albumin 2.7. Change to renal diet/Fld restrictions. Prostat and renal vit. 6. Dementia: Per primary. Psych eval requested per primary.  7. GOC: Palliative care consult pending.  Plan - HD tomorrow  Vinson Moselleob Laval Cafaro MD [facility] pager [phone]   04/04/2017, 2:16 PM

## 2017-04-04 NOTE — Progress Notes (Signed)
   Will plan for left AKA along with general surgery decubitus debridement early next week. Will try to coordinate for Monday.   Racquel Arkin C. Randie Heinzain, MD [facility] [place] Office: [phone] Pager: [phone]

## 2017-04-05 LAB — CBC
HEMATOCRIT: 28.7 % — AB (ref 39.0–52.0)
Hemoglobin: 8.9 g/dL — ABNORMAL LOW (ref 13.0–17.0)
MCH: 27.3 pg (ref 26.0–34.0)
MCHC: 31 g/dL (ref 30.0–36.0)
MCV: 88 fL (ref 78.0–100.0)
Platelets: 286 10*3/uL (ref 150–400)
RBC: 3.26 MIL/uL — AB (ref 4.22–5.81)
RDW: 16.8 % — ABNORMAL HIGH (ref 11.5–15.5)
WBC: 11.6 10*3/uL — AB (ref 4.0–10.5)

## 2017-04-05 LAB — BASIC METABOLIC PANEL
ANION GAP: 13 (ref 5–15)
BUN: 44 mg/dL — ABNORMAL HIGH (ref 6–20)
CO2: 27 mmol/L (ref 22–32)
Calcium: 8.4 mg/dL — ABNORMAL LOW (ref 8.9–10.3)
Chloride: 94 mmol/L — ABNORMAL LOW (ref 101–111)
Creatinine, Ser: 7.75 mg/dL — ABNORMAL HIGH (ref 0.61–1.24)
GFR calc Af Amer: 7 mL/min — ABNORMAL LOW (ref >60)
GFR, EST NON AFRICAN AMERICAN: 6 mL/min — AB (ref >60)
GLUCOSE: 81 mg/dL (ref 65–99)
POTASSIUM: 3.5 mmol/L (ref 3.5–5.1)
Sodium: 134 mmol/L — ABNORMAL LOW (ref 135–145)

## 2017-04-05 LAB — SURGICAL PCR SCREEN
MRSA, PCR: NEGATIVE
Staphylococcus aureus: NEGATIVE

## 2017-04-05 MED ORDER — ALTEPLASE 2 MG IJ SOLR: 2.0000 mg | Freq: Once | INTRAMUSCULAR | Status: DC | PRN

## 2017-04-05 MED ORDER — CALCITRIOL 0.25 MCG PO CAPS
ORAL_CAPSULE | ORAL | Status: AC
  Administered 2017-04-05: 0.25 ug via ORAL
  Filled 2017-04-05: qty 1

## 2017-04-05 MED ORDER — ACETAMINOPHEN 325 MG PO TABS
ORAL_TABLET | ORAL | Status: AC
  Administered 2017-04-05: 650 mg via ORAL
  Filled 2017-04-05: qty 2

## 2017-04-05 MED ORDER — VANCOMYCIN HCL IN DEXTROSE 1-5 GM/200ML-% IV SOLN
INTRAVENOUS | Status: AC
  Administered 2017-04-05: 1 g via INTRAVENOUS
  Filled 2017-04-05: qty 200

## 2017-04-05 MED ORDER — LIDOCAINE-PRILOCAINE 2.5-2.5 % EX CREA: 1.0000 "application " | TOPICAL_CREAM | CUTANEOUS | Status: DC | PRN

## 2017-04-05 MED ORDER — HEPARIN SODIUM (PORCINE) 1000 UNIT/ML DIALYSIS: 1000.0000 [IU] | INTRAMUSCULAR | Status: DC | PRN

## 2017-04-05 MED ORDER — FENTANYL CITRATE (PF) 100 MCG/2ML IJ SOLN
INTRAMUSCULAR | Status: AC
  Filled 2017-04-05: qty 2

## 2017-04-05 MED ORDER — HEPARIN SODIUM (PORCINE) 5000 UNIT/ML IJ SOLN
5000.0000 [IU] | Freq: Three times a day (TID) | INTRAMUSCULAR | Status: DC
  Administered 2017-04-05 – 2017-04-10 (×11): 5000 [IU] via SUBCUTANEOUS
  Filled 2017-04-05 (×11): qty 1

## 2017-04-05 MED ORDER — PENTAFLUOROPROP-TETRAFLUOROETH EX AERO: 1.0000 "application " | INHALATION_SPRAY | CUTANEOUS | Status: DC | PRN

## 2017-04-05 MED ORDER — SODIUM CHLORIDE 0.9 % IV SOLN: 100.0000 mL | INTRAVENOUS | Status: DC | PRN

## 2017-04-05 MED ORDER — HEPARIN SODIUM (PORCINE) 1000 UNIT/ML DIALYSIS: 3000.0000 [IU] | INTRAMUSCULAR | Status: DC | PRN

## 2017-04-05 MED ORDER — LIDOCAINE HCL (PF) 1 % IJ SOLN: 5.0000 mL | INTRAMUSCULAR | Status: DC | PRN

## 2017-04-05 MED ORDER — CALCITRIOL 0.5 MCG PO CAPS
ORAL_CAPSULE | ORAL | Status: AC
  Administered 2017-04-05: 1 ug
  Filled 2017-04-05: qty 2

## 2017-04-05 NOTE — Care Management Important Message (Signed)
Important Message  Patient Details  Name: [name] MRN: [id number] Date of Birth: 10/08/1935   Medicare Important Message Given:  Yes    Peola Joynt, Annamarie Majorheryl U, RN 04/05/2017, 2:36 PM

## 2017-04-05 NOTE — Progress Notes (Signed)
Despite taking snuff from patient the morning before, RN came in and found a cup at the bedside with brown residue in it. RN searched room and found snuff in patients drawer. RN also took this snuff and placed it in patients daily bin. RN will make oncoming RN aware of situation to further educate patients family about hospital policy.  Veatrice KellsMahmoud,Geneveive Furness I, RN

## 2017-04-05 NOTE — Progress Notes (Signed)
[facility] Progress Note   Subjective: No C/Os. On HD. For surgery possibly on Monday.   Objective Vitals:   04/05/17 1015 04/05/17 1030 04/05/17 1045 04/05/17 1100  BP: (!) 82/27 (!) 90/37 (!) 104/46 (!) 107/46  Pulse: 72 69 69 69  Resp:      Temp:      TempSrc:      SpO2:      Weight:      Height:       Physical Exam General: Chronically ill appearing male in NAD Heart: S1,S2 RRR Lungs: CTAB A/P Abdomen: Active BS  Extremities: R AKA. Evidence of dry with wet gangrene 2-4th L toes. Edema and demarcation L foot.  Dialysis Access: LFA AVG cannulated at present.    Additional Objective Labs: Basic Metabolic Panel: Recent Labs  Lab 04/03/17 0900 04/04/17 0542 04/05/17 0516  NA 134* 137 134*  K 4.0 3.6 3.5  CL 93* 98* 94*  CO2 27 28 27   GLUCOSE 83 90 81  BUN 53* 26* 44*  CREATININE 11.05* 5.80* 7.75*  CALCIUM 8.5* 8.4* 8.4*  PHOS 6.9*  --   --    Liver Function Tests: Recent Labs  Lab 04/01/17 2145 04/03/17 0900  AST 26  --   ALT 18  --   ALKPHOS 105  --   BILITOT 0.8  --   PROT 7.1  --   ALBUMIN 2.7* 2.1*   No results for input(s): LIPASE, AMYLASE in the last 168 hours. CBC: Recent Labs  Lab 04/01/17 2145 04/03/17 0346 04/04/17 0542 04/05/17 0516  WBC 14.9* 11.1* 12.2* 11.6*  NEUTROABS 13.5*  --   --   --   HGB 11.1* 9.3* 9.4* 8.9*  HCT 34.2* 29.6* 30.5* 28.7*  MCV 88.6 88.4 90.0 88.0  PLT 287 242 279 286   Blood Culture    Component Value Date/Time   SDES BLOOD RIGHT WRIST 04/02/2017 0150   SPECREQUEST  04/02/2017 0150    BOTTLES DRAWN AEROBIC AND ANAEROBIC Blood Culture results may not be optimal due to an inadequate volume of blood received in culture bottles   CULT NO GROWTH 2 DAYS 04/02/2017 0150   REPTSTATUS PENDING 04/02/2017 0150    Cardiac Enzymes: No results for input(s): CKTOTAL, CKMB, CKMBINDEX, TROPONINI in the last 168 hours. CBG: Recent Labs  Lab 04/01/17 1953 04/01/17 2040 04/02/17 1044  04/02/17 1114  GLUCAP 55* 76 68 106*   Iron Studies: No results for input(s): IRON, TIBC, TRANSFERRIN, FERRITIN in the last 72 hours. @lablastinr3 @ Studies/Results: No results found. Medications: . sodium chloride    . sodium chloride    . piperacillin-tazobactam (ZOSYN)  IV Stopped (04/05/17 0108)  . vancomycin 1 g (04/05/17 1008)   . calcitRIOL  0.25 mcg Oral Q M,W,F-HD  . Chlorhexidine Gluconate Cloth  6 each Topical Q0600  . feeding supplement (ENSURE ENLIVE)  237 mL Oral BID BM  . feeding supplement (PRO-STAT SUGAR FREE 64)  30 mL Oral TID  . fentaNYL      . heparin  5,000 Units Subcutaneous Q8H  . multivitamin  1 tablet Oral QHS  . mupirocin ointment  1 application Nasal BID   Dialysis Orders: MWF [facility] 4h 15min   86kg  Hep 8500  2/2 bath 450/Auto 1.5  L AVG Parsibiv 7.5 mcg IV TIW (not on hospital formulary) Mircera 100 mcg IV Q 2 weeks (last dose 03/20/17 Last HGB 10.3 03/20/17) Calcitriol 0.25 mcg PO TIW  BMD meds: No binders on OP med  list  Assessment: 1. Gangrene L Foot / necrotic sacral decub: possibly for surgery next week. Made DNR. IV abx.  2. ESRD - HD MWF. HD today, min UF.  3. Anemia  - HGB 9.3. ESA due 04/03/17. Give Aranesp 40 mcg IV with HD today.  4. Metabolic bone disease -  Renal profile tomorrow with HD. Cont VDRA, unable to give parsibiv while in patient. Add binder after checking Phos.  5. Nutrition - Albumin 2.7. Change to renal diet/Fld restrictions. Prostat and renal vit. 6. Dementia: Per primary. Psych noted not competent to make decisions.   Plan - as above  Vinson Moselleob Jaiquan Temme MD [facility] pager [phone]   04/05/2017, 11:24 AM

## 2017-04-05 NOTE — Progress Notes (Signed)
PROGRESS NOTE  [name] ZOX:[id number] DOB: 08/11/1935 DOA: 04/01/2017 PCP: Crist FatVan Eyk, Jason, MD   LOS: 3 days   Brief Narrative / Interim history: 81 year old male with ESRD, hypertension, right AKA, presented with necrosis of toes on the left feet.  Patient also had been missing dialysis for 1 week, was sent by HD center due to worsening necrosis.  Assessment & Plan: Principal Problem:   Evaluation by psychiatric service required Active Problems:   End stage renal disease (HCC)   Gangrene of left foot (HCC)   Cellulitis of left foot   Dementia   DNR (do not resuscitate) discussion   Palliative care by specialist   Sacral wound, subsequent encounter   Goals of care -Discussed with family on 11/15, continue medical/surgical care for his necrotic toe and sacral decubitus.  CODE STATUS changed to DNR.    Left toes necrosis/gangrene / PVD -Patient is afebrile, mild leukocytosis -Currently on vancomycin and Zosyn, continue, consulted vascular surgery to evaluate for amputation as his toes are unlikely to heal with conservative management -It appears that patient will undergo surgery on Monday  Dementia -Psychiatry consulted, patient does not have capacity to make medical decisions   ESRD -nephrology consulted, HD today  Sacral decubitus ulcer  -Surgery consulted, will need debridement surgery, likely to happen on Monday   DVT prophylaxis: heparin Code Status: Full code Family Communication: No family at bedside this morning Disposition Plan: TBD  Consultants:   Palliative  Nephrology  Psychiatry   Procedures:   None   Antimicrobials:  Vancomycin 11/12 >>   Zosyn 11/12 >>  Subjective: -Seen in hemodialysis, his main issue is left foot pain.  No chest pain, no shortness of breath  Objective: Vitals:   04/05/17 1015 04/05/17 1030 04/05/17 1045 04/05/17 1100  BP: (!) 82/27 (!) 90/37 (!) 104/46 (!) 107/46  Pulse: 72 69 69 69  Resp:      Temp:        TempSrc:      SpO2:      Weight:      Height:        Intake/Output Summary (Last 24 hours) at 04/05/2017 1110 Last data filed at 04/05/2017 0600 Gross per 24 hour  Intake 220 ml  Output 0 ml  Net 220 ml   Filed Weights   04/03/17 1306 04/03/17 2206 04/05/17 0830  Weight: 125 kg (275 lb 9.2 oz) 125.2 kg (276 lb 0.3 oz) 123.4 kg (272 lb 0.8 oz)    Examination:  Constitutional: NAD Respiratory: CTA Cardiovascular: RRR  Data Reviewed: I have independently reviewed following labs and imaging studies   CBC: Recent Labs  Lab 04/01/17 2145 04/03/17 0346 04/04/17 0542 04/05/17 0516  WBC 14.9* 11.1* 12.2* 11.6*  NEUTROABS 13.5*  --   --   --   HGB 11.1* 9.3* 9.4* 8.9*  HCT 34.2* 29.6* 30.5* 28.7*  MCV 88.6 88.4 90.0 88.0  PLT 287 242 279 286   Basic Metabolic Panel: Recent Labs  Lab 04/01/17 2145 04/03/17 0900 04/04/17 0542 04/05/17 0516  NA 135 134* 137 134*  K 3.7 4.0 3.6 3.5  CL 91* 93* 98* 94*  CO2 26 27 28 27   GLUCOSE 90 83 90 81  BUN 40* 53* 26* 44*  CREATININE 9.62* 11.05* 5.80* 7.75*  CALCIUM 8.8* 8.5* 8.4* 8.4*  PHOS  --  6.9*  --   --    GFR: Estimated Creatinine Clearance: 10.2 mL/min (A) (by C-G formula based on SCr of 7.75  mg/dL (H)). Liver Function Tests: Recent Labs  Lab 04/01/17 2145 04/03/17 0900  AST 26  --   ALT 18  --   ALKPHOS 105  --   BILITOT 0.8  --   PROT 7.1  --   ALBUMIN 2.7* 2.1*   No results for input(s): LIPASE, AMYLASE in the last 168 hours. No results for input(s): AMMONIA in the last 168 hours. Coagulation Profile: Recent Labs  Lab 04/01/17 2145  INR 1.30   Cardiac Enzymes: No results for input(s): CKTOTAL, CKMB, CKMBINDEX, TROPONINI in the last 168 hours. BNP (last 3 results) No results for input(s): PROBNP in the last 8760 hours. HbA1C: No results for input(s): HGBA1C in the last 72 hours. CBG: Recent Labs  Lab 04/01/17 1953 04/01/17 2040 04/02/17 1044 04/02/17 1114  GLUCAP 55* 76 68 106*   Lipid  Profile: No results for input(s): CHOL, HDL, LDLCALC, TRIG, CHOLHDL, LDLDIRECT in the last 72 hours. Thyroid Function Tests: No results for input(s): TSH, T4TOTAL, FREET4, T3FREE, THYROIDAB in the last 72 hours. Anemia Panel: No results for input(s): VITAMINB12, FOLATE, FERRITIN, TIBC, IRON, RETICCTPCT in the last 72 hours. Urine analysis:    Component Value Date/Time   COLORURINE YELLOW 12/26/2016 0024   APPEARANCEUR CLOUDY (A) 12/26/2016 0024   LABSPEC 1.016 12/26/2016 0024   PHURINE 8.0 12/26/2016 0024   GLUCOSEU NEGATIVE 12/26/2016 0024   HGBUR SMALL (A) 12/26/2016 0024   BILIRUBINUR NEGATIVE 12/26/2016 0024   KETONESUR NEGATIVE 12/26/2016 0024   PROTEINUR 100 (A) 12/26/2016 0024   UROBILINOGEN 0.2 03/08/2011 0253   NITRITE NEGATIVE 12/26/2016 0024   LEUKOCYTESUR LARGE (A) 12/26/2016 0024   Sepsis Labs: Invalid input(s): PROCALCITONIN, LACTICIDVEN  Recent Results (from the past 240 hour(s))  Culture, blood (routine x 2)     Status: None (Preliminary result)   Collection Time: 04/01/17  9:45 PM  Result Value Ref Range Status   Specimen Description BLOOD RIGHT HAND  Final   Special Requests IN PEDIATRIC BOTTLE Blood Culture adequate volume  Final   Culture NO GROWTH 3 DAYS  Final   Report Status PENDING  Incomplete  Culture, blood (routine x 2)     Status: None (Preliminary result)   Collection Time: 04/02/17  1:50 AM  Result Value Ref Range Status   Specimen Description BLOOD RIGHT WRIST  Final   Special Requests   Final    BOTTLES DRAWN AEROBIC AND ANAEROBIC Blood Culture results may not be optimal due to an inadequate volume of blood received in culture bottles   Culture NO GROWTH 2 DAYS  Final   Report Status PENDING  Incomplete  MRSA PCR Screening     Status: Abnormal   Collection Time: 04/02/17  3:39 AM  Result Value Ref Range Status   MRSA by PCR POSITIVE (A) NEGATIVE Final    Comment:        The GeneXpert MRSA Assay (FDA approved for NASAL specimens only), is  one component of a comprehensive MRSA colonization surveillance program. It is not intended to diagnose MRSA infection nor to guide or monitor treatment for MRSA infections. RESULT CALLED TO, READ BACK BY AND VERIFIED WITHAngelina Sheriff RN [id number] 04/02/17 MITCHELL,L   Surgical pcr screen     Status: None   Collection Time: 04/04/17 10:54 PM  Result Value Ref Range Status   MRSA, PCR NEGATIVE NEGATIVE Final   Staphylococcus aureus NEGATIVE NEGATIVE Final    Comment: (NOTE) The Xpert SA Assay (FDA approved for NASAL specimens in patients 22 years  of age and older), is one component of a comprehensive surveillance program. It is not intended to diagnose infection nor to guide or monitor treatment.       Radiology Studies: No results found.   Scheduled Meds: . calcitRIOL  0.25 mcg Oral Q M,W,F-HD  . Chlorhexidine Gluconate Cloth  6 each Topical Q0600  . feeding supplement (ENSURE ENLIVE)  237 mL Oral BID BM  . feeding supplement (PRO-STAT SUGAR FREE 64)  30 mL Oral TID  . fentaNYL      . heparin  5,000 Units Subcutaneous Q8H  . multivitamin  1 tablet Oral QHS  . mupirocin ointment  1 application Nasal BID   Continuous Infusions: . sodium chloride    . sodium chloride    . piperacillin-tazobactam (ZOSYN)  IV Stopped (04/05/17 0108)  . vancomycin 1 g (04/05/17 1008)    Pamella Pertostin Edge Mauger, MD, PhD [facility] Pager [phone]  If 7PM-7AM, please contact night-coverage [email or web address] Password [email or web address] 04/05/2017, 11:10 AM

## 2017-04-05 NOTE — Clinical Social Work Note (Addendum)
CSW consulted with [facility] liaison Loury regarding patient and possible LTAC admission once patient ready for discharge. Loury advised that wife informed CSW on 11/15 that patient was set to d/c to [facility] at home point. Loury informed that he will be advised if family interested in [facility] once discharge options discussed with family.   Genelle BalVanessa Nyzier Boivin, MSW, LCSW Licensed Clinical Social Worker Clinical Social Work Department [facility] [phone]

## 2017-04-05 NOTE — Progress Notes (Signed)
Pharmacy Antibiotic Note  [name] is a 81 y.o. male admitted on 04/01/2017 with wound infection.  Pharmacy has been consulted for Vancomycin and Zosyn dosing.   The patient is ESRD-MWF and is currently on schedule. Receiving HD 11/16 AM and tolerating well. The patient has been receiving doses appropriately with HD sessions.   Noted tentative plans for sacral wound I&D and L-BKA +/- diverting colostomy tentatively on Monday, 11/19.  Plan: 1. Continue Vancomycin 1g IV with HD-MWF 2. Continue Zosyn 3.375g IV q12h EI 3. Will continue to follow HD schedule/duration, culture results, LOT, and antibiotic de-escalation plans   Height: 5\' 10"  (177.8 cm) Weight: 272 lb 0.8 oz (123.4 kg) IBW/kg (Calculated) : 73  Temp (24hrs), Avg:98.4 F (36.9 C), Min:98.2 F (36.8 C), Max:98.9 F (37.2 C)  Recent Labs  Lab 04/01/17 2145 04/01/17 2158 04/03/17 0346 04/03/17 0900 04/04/17 0542 04/05/17 0516  WBC 14.9*  --  11.1*  --  12.2* 11.6*  CREATININE 9.62*  --   --  11.05* 5.80* 7.75*  LATICACIDVEN  --  1.94*  --   --   --   --     Estimated Creatinine Clearance: 10.2 mL/min (A) (by C-G formula based on SCr of 7.75 mg/dL (H)).    Allergies  Allergen Reactions  . Iohexol Swelling and Rash  . Zanaflex [Tizanidine] Other (See Comments)    Shut kidneys down Unable to walk or void  . Ezetimibe-Simvastatin Other (See Comments)    Elevated liver enzymes  . Fluvastatin Other (See Comments)    Elevated liver enzymes  . Simvastatin Other (See Comments)    Elevated liver enzymes    Vanc 11/12>> Zosyn 11/12>>  11/12 @ 2145 BCx: ngtd 11/13 @ 0150 BCx: ngtd 11/13 MRSA PCR: pos 11/15 MRSA PCR >> neg  Thank you for allowing pharmacy to be a part of this patient's care.  Georgina PillionElizabeth Glenice Ciccone, PharmD, BCPS Clinical Pharmacist Pager: [phone] Clinical phone for 04/05/2017 from 7a-3:30p: [phone] If after 3:30p, please call main pharmacy at: [phone] 04/05/2017 11:30 AM

## 2017-04-05 NOTE — Progress Notes (Signed)
[facility] Progress Note     Subjective: CC: decubitus ulcer Patient resting in bed during dialysis. Having some pain in foot.  Will try to reach out to family regarding diverting colostomy. Patient denies abdominal pain, n/v, decreased appetite.   Objective: Vital signs in last 24 hours: Temp:  [98.2 F (36.8 C)-98.9 F (37.2 C)] 98.4 F (36.9 C) (11/16 0553) Pulse Rate:  [70-83] 70 (11/16 0553) Resp:  [18] 18 (11/16 0553) BP: (104-128)/(35-51) 109/35 (11/16 0553) SpO2:  [97 %-100 %] 98 % (11/16 0553) Last BM Date: 04/04/17  Intake/Output from previous day: 11/15 0701 - 11/16 0700 In: 220 [P.O.:120; IV Piggyback:100] Out: 0  Intake/Output this shift: No intake/output data recorded.  PE: Gen: NAD, cooperative Pulm:  Normal effort, clear to auscultation bilaterally\ Abd: soft, NT, ND Skin: warm and dry, no rashes; sacral wound with large eschar covering most of wound, small area of stage III pressure ulcer at the inferior portion of the wound with tan drainage, malodorous. Stage II ulceration around periphery of wound.   Lab Results:  Recent Labs    04/04/17 0542 04/05/17 0516  WBC 12.2* 11.6*  HGB 9.4* 8.9*  HCT 30.5* 28.7*  PLT 279 286   BMET Recent Labs    04/04/17 0542 04/05/17 0516  NA 137 134*  K 3.6 3.5  CL 98* 94*  CO2 28 27  GLUCOSE 90 81  BUN 26* 44*  CREATININE 5.80* 7.75*  CALCIUM 8.4* 8.4*   PT/INR No results for input(s): LABPROT, INR in the last 72 hours. CMP     Component Value Date/Time   NA 134 (L) 04/05/2017 0516   K 3.5 04/05/2017 0516   CL 94 (L) 04/05/2017 0516   CO2 27 04/05/2017 0516   GLUCOSE 81 04/05/2017 0516   BUN 44 (H) 04/05/2017 0516   CREATININE 7.75 (H) 04/05/2017 0516   CALCIUM 8.4 (L) 04/05/2017 0516   CALCIUM 8.4 11/29/2007 1515   PROT 7.1 04/01/2017 2145   ALBUMIN 2.1 (L) 04/03/2017 0900   AST 26 04/01/2017 2145   ALT 18 04/01/2017 2145   ALKPHOS 105 04/01/2017 2145   BILITOT 0.8  04/01/2017 2145   GFRNONAA 6 (L) 04/05/2017 0516   GFRAA 7 (L) 04/05/2017 0516   Lipase  No results found for: LIPASE     Studies/Results: No results found.  Anti-infectives: Anti-infectives (From admission, onward)   Start     Dose/Rate Route Frequency Ordered Stop   04/03/17 1200  vancomycin (VANCOCIN) IVPB 1000 mg/200 mL premix     1,000 mg 200 mL/hr over 60 Minutes Intravenous Every M-W-F (Hemodialysis) 04/02/17 1328     04/03/17 1002  vancomycin (VANCOCIN) 1-5 GM/200ML-% IVPB    Comments:  Carlyon ProwsZhao, Xiaobo   : cabinet override      04/03/17 1002 04/03/17 1135   04/02/17 1400  piperacillin-tazobactam (ZOSYN) IVPB 3.375 g     3.375 g 12.5 mL/hr over 240 Minutes Intravenous Every 12 hours 04/02/17 1328     04/02/17 1200  vancomycin (VANCOCIN) IVPB 1000 mg/200 mL premix  Status:  Discontinued     1,000 mg 200 mL/hr over 60 Minutes Intravenous Every T-Th-Sa (Hemodialysis) 04/02/17 0314 04/02/17 1328   04/02/17 0315  vancomycin (VANCOCIN) 2,000 mg in sodium chloride 0.9 % 500 mL IVPB     2,000 mg 250 mL/hr over 120 Minutes Intravenous  Once 04/02/17 0314 04/02/17 0612   04/01/17 2300  piperacillin-tazobactam (ZOSYN) IVPB 3.375 g     3.375 g 100 mL/hr  over 30 Minutes Intravenous  Once 04/01/17 2246 04/02/17 0212       Assessment/Plan Right AKA, and LLE dry gangrene of the 1st - 4th toes ESRD on HD- TTS - scheduled for HD todayalso HTN Type II diabetes Deconditioning and Malnutrition   Encephalopathy/Dementia -psych consulted and deemed patient unable to make decisions due to lack of capacity ?Neglect evaluation in progress Palliative evaluation - patient and family would like to continue aggressive care for MMP  UnstageableDecubitus ulcer - likely stage III/IV - eschar present - patient bed bound and unable to mobilize - WBC 11.6, afebrile  - continue xeroform gauze, change BID or more if soiled - wound not likely to heal well even with surgical debridement -  consider whether diverting colostomy is needed to keep stool out of wound - will try to reach out to family today   FEN: renal diet with fluid restriction VTE: heparin ID: zosyn (11/12>>), vanc now for MWF (11/13>>)   Plan: Vascular planning left AKA for possibly Monday, would like to be able to do sacral wound at the same time. Will try to figure out whether diverting colostomy also desired. Continue current care through weekend.     LOS: 3 days    Wells GuilesKelly Rayburn , [facility] 04/05/2017, 8:26 AM Pager: [phone] Consults: [phone] Mon-Fri 7:00 am-4:30 pm Sat-Sun 7:00 am-11:30 am

## 2017-04-06 LAB — CULTURE, BLOOD (ROUTINE X 2)
Culture: NO GROWTH
SPECIAL REQUESTS: ADEQUATE

## 2017-04-06 MED ORDER — DARBEPOETIN ALFA 60 MCG/0.3ML IJ SOSY
60.0000 ug | PREFILLED_SYRINGE | Freq: Once | INTRAMUSCULAR | Status: AC
  Administered 2017-04-07: 60 ug via INTRAVENOUS

## 2017-04-06 NOTE — Progress Notes (Addendum)
[facility] Progress Note   Subjective: No C/Os. On HD. For surgery possibly on Monday.   Objective Vitals:   04/05/17 1206 04/05/17 1703 04/05/17 2200 04/06/17 0408  BP: (!) 115/49 (!) 124/45 (!) 119/43 113/64  Pulse: 73 74 77 84  Resp: 18 18 18 16   Temp: 98.2 F (36.8 C) 98.8 F (37.1 C) 98.8 F (37.1 C) 98.9 F (37.2 C)  TempSrc: Oral Oral Oral Oral  SpO2: 98% 98% 100% 100%  Weight: 121 kg (266 lb 12.1 oz)     Height:       Physical Exam General: Chronically ill appearing male in NAD Heart: S1,S2 RRR Lungs: CTAB A/P Abdomen: Active BS  Extremities: R AKA. Evidence of dry with wet gangrene 2-4th L toes. Edema and demarcation L foot.  Dialysis Access: LFA AVG cannulated at present.    Additional Objective Labs: Basic Metabolic Panel: Recent Labs  Lab 04/03/17 0900 04/04/17 0542 04/05/17 0516  NA 134* 137 134*  K 4.0 3.6 3.5  CL 93* 98* 94*  CO2 27 28 27   GLUCOSE 83 90 81  BUN 53* 26* 44*  CREATININE 11.05* 5.80* 7.75*  CALCIUM 8.5* 8.4* 8.4*  PHOS 6.9*  --   --    Liver Function Tests: Recent Labs  Lab 04/01/17 2145 04/03/17 0900  AST 26  --   ALT 18  --   ALKPHOS 105  --   BILITOT 0.8  --   PROT 7.1  --   ALBUMIN 2.7* 2.1*   No results for input(s): LIPASE, AMYLASE in the last 168 hours. CBC: Recent Labs  Lab 04/01/17 2145 04/03/17 0346 04/04/17 0542 04/05/17 0516  WBC 14.9* 11.1* 12.2* 11.6*  NEUTROABS 13.5*  --   --   --   HGB 11.1* 9.3* 9.4* 8.9*  HCT 34.2* 29.6* 30.5* 28.7*  MCV 88.6 88.4 90.0 88.0  PLT 287 242 279 286   Blood Culture    Component Value Date/Time   SDES BLOOD RIGHT WRIST 04/02/2017 0150   SPECREQUEST  04/02/2017 0150    BOTTLES DRAWN AEROBIC AND ANAEROBIC Blood Culture results may not be optimal due to an inadequate volume of blood received in culture bottles   CULT NO GROWTH 3 DAYS 04/02/2017 0150   REPTSTATUS PENDING 04/02/2017 0150    Cardiac Enzymes: No results for input(s): CKTOTAL,  CKMB, CKMBINDEX, TROPONINI in the last 168 hours. CBG: Recent Labs  Lab 04/01/17 1953 04/01/17 2040 04/02/17 1044 04/02/17 1114  GLUCAP 55* 76 68 106*   Iron Studies: No results for input(s): IRON, TIBC, TRANSFERRIN, FERRITIN in the last 72 hours. @lablastinr3 @ Studies/Results: No results found. Medications: . piperacillin-tazobactam (ZOSYN)  IV 3.375 g (04/06/17 1146)  . vancomycin 1 g (04/05/17 1008)   . calcitRIOL  0.25 mcg Oral Q M,W,F-HD  . Chlorhexidine Gluconate Cloth  6 each Topical Q0600  . feeding supplement (ENSURE ENLIVE)  237 mL Oral BID BM  . feeding supplement (PRO-STAT SUGAR FREE 64)  30 mL Oral TID  . heparin  5,000 Units Subcutaneous Q8H  . multivitamin  1 tablet Oral QHS  . mupirocin ointment  1 application Nasal BID   Dialysis Orders: MWF [facility] 4h 15min   86kg  Hep 8500  2/2 bath   L AVG Parsibiv 7.5 mcg IV TIW (not on hospital formulary) Mircera 100 mcg IV Q 2 weeks (last dose 03/20/17 Last HGB 10.3 03/20/17) Calcitriol 0.25 mcg PO TIW  BMD meds: No binders on OP med list  Assessment:  1. Gangrene L Foot / necrotic sacral decub: prob surgery early this week. Made DNR. IV abx.  2. ESRD - HD MWF. Next HD Monday.   3. Anemia  - HGB down to 8.9. Give Aranesp 40 mcg IV with HD tomorrow.  4. Metabolic bone disease -  Renal profile tomorrow with HD. Cont VDRA, unable to give parsibiv while in patient. Add binder after checking Phos.  5. Nutrition - Albumin 2.7. Change to renal diet/Fld restrictions. Prostat and renal vit. 6. Dementia: Per primary. Psych noted not competent to make decisions.   Plan - as above  Vinson Moselleob Eliette Drumwright MD [facility] pager [phone]   04/06/2017, 11:58 AM

## 2017-04-06 NOTE — Progress Notes (Addendum)
PROGRESS NOTE  Ian BlakeWarren J Turner NWG:[id number] DOB: 12/22/1935 DOA: 04/01/2017 PCP: Crist FatVan Eyk, Jason, MD   LOS: 4 days   Brief Narrative / Interim history: 81 year old male with ESRD, hypertension, right AKA, presented with necrosis of toes on the left feet.  Patient also had been missing dialysis for 1 week, was sent by HD center due to worsening necrosis.  Assessment & Plan: Principal Problem:   Evaluation by psychiatric service required Active Problems:   End stage renal disease (HCC)   Gangrene of left foot (HCC)   Cellulitis of left foot   Dementia   DNR (do not resuscitate) discussion   Palliative care by specialist   Sacral wound, subsequent encounter   Goals of care -Discussed with family on 11/15, continue medical/surgical care for his necrotic toe and sacral decubitus.  CODE STATUS changed to DNR.    Left toes necrosis/gangrene / PVD -Patient is afebrile, mild leukocytosis -Currently on vancomycin and Zosyn, continue, consulted vascular surgery to evaluate for amputation as his toes are unlikely to heal with conservative management -It appears that patient will undergo surgery on Monday  Dementia -Psychiatry consulted, patient does not have capacity to make medical decisions   ESRD -nephrology consulted, HD today  Sacral decubitus ulcer  -Surgery consulted, will need debridement surgery, likely to happen on Monday   DVT prophylaxis: heparin Code Status: DNR Family Communication: No family at bedside this morning Disposition Plan: TBD  Consultants:   Palliative  Nephrology  Psychiatry   General surgery  Vascular surgery  Procedures:   None   Antimicrobials:  Vancomycin 11/12 >>   Zosyn 11/12 >>  Subjective: -laying in bed, NAD, report  left foot pain.  No chest pain, no shortness of breath  Objective: Vitals:   04/05/17 1703 04/05/17 2200 04/06/17 0408 04/06/17 0958  BP: (!) 124/45 (!) 119/43 113/64 114/68  Pulse: 74 77 84 81  Resp: 18  18 16 16   Temp: 98.8 F (37.1 C) 98.8 F (37.1 C) 98.9 F (37.2 C) 98.4 F (36.9 C)  TempSrc: Oral Oral Oral Oral  SpO2: 98% 100% 100% 100%  Weight:      Height:        Intake/Output Summary (Last 24 hours) at 04/06/2017 1426 Last data filed at 04/06/2017 1304 Gross per 24 hour  Intake 50 ml  Output 0 ml  Net 50 ml   Filed Weights   04/03/17 2206 04/05/17 0830 04/05/17 1206  Weight: 125.2 kg (276 lb 0.3 oz) 123.4 kg (272 lb 0.8 oz) 121 kg (266 lb 12.1 oz)    Examination:  Constitutional: NAD Respiratory: CTA Cardiovascular: RRR Extremity: s/p RAKA, dry gangrene 2-4th toes left foot.  Dialysis Access: LFA AVG cannulated at present.    Data Reviewed: I have independently reviewed following labs and imaging studies   CBC: Recent Labs  Lab 04/01/17 2145 04/03/17 0346 04/04/17 0542 04/05/17 0516  WBC 14.9* 11.1* 12.2* 11.6*  NEUTROABS 13.5*  --   --   --   HGB 11.1* 9.3* 9.4* 8.9*  HCT 34.2* 29.6* 30.5* 28.7*  MCV 88.6 88.4 90.0 88.0  PLT 287 242 279 286   Basic Metabolic Panel: Recent Labs  Lab 04/01/17 2145 04/03/17 0900 04/04/17 0542 04/05/17 0516  NA 135 134* 137 134*  K 3.7 4.0 3.6 3.5  CL 91* 93* 98* 94*  CO2 26 27 28 27   GLUCOSE 90 83 90 81  BUN 40* 53* 26* 44*  CREATININE 9.62* 11.05* 5.80* 7.75*  CALCIUM 8.8*  8.5* 8.4* 8.4*  PHOS  --  6.9*  --   --    GFR: Estimated Creatinine Clearance: 10.1 mL/min (A) (by C-G formula based on SCr of 7.75 mg/dL (H)). Liver Function Tests: Recent Labs  Lab 04/01/17 2145 04/03/17 0900  AST 26  --   ALT 18  --   ALKPHOS 105  --   BILITOT 0.8  --   PROT 7.1  --   ALBUMIN 2.7* 2.1*   No results for input(s): LIPASE, AMYLASE in the last 168 hours. No results for input(s): AMMONIA in the last 168 hours. Coagulation Profile: Recent Labs  Lab 04/01/17 2145  INR 1.30   Cardiac Enzymes: No results for input(s): CKTOTAL, CKMB, CKMBINDEX, TROPONINI in the last 168 hours. BNP (last 3 results) No  results for input(s): PROBNP in the last 8760 hours. HbA1C: No results for input(s): HGBA1C in the last 72 hours. CBG: Recent Labs  Lab 04/01/17 1953 04/01/17 2040 04/02/17 1044 04/02/17 1114  GLUCAP 55* 76 68 106*   Lipid Profile: No results for input(s): CHOL, HDL, LDLCALC, TRIG, CHOLHDL, LDLDIRECT in the last 72 hours. Thyroid Function Tests: No results for input(s): TSH, T4TOTAL, FREET4, T3FREE, THYROIDAB in the last 72 hours. Anemia Panel: No results for input(s): VITAMINB12, FOLATE, FERRITIN, TIBC, IRON, RETICCTPCT in the last 72 hours. Urine analysis:    Component Value Date/Time   COLORURINE YELLOW 12/26/2016 0024   APPEARANCEUR CLOUDY (A) 12/26/2016 0024   LABSPEC 1.016 12/26/2016 0024   PHURINE 8.0 12/26/2016 0024   GLUCOSEU NEGATIVE 12/26/2016 0024   HGBUR SMALL (A) 12/26/2016 0024   BILIRUBINUR NEGATIVE 12/26/2016 0024   KETONESUR NEGATIVE 12/26/2016 0024   PROTEINUR 100 (A) 12/26/2016 0024   UROBILINOGEN 0.2 03/08/2011 0253   NITRITE NEGATIVE 12/26/2016 0024   LEUKOCYTESUR LARGE (A) 12/26/2016 0024   Sepsis Labs: Invalid input(s): PROCALCITONIN, LACTICIDVEN  Recent Results (from the past 240 hour(s))  Culture, blood (routine x 2)     Status: None (Preliminary result)   Collection Time: 04/01/17  9:45 PM  Result Value Ref Range Status   Specimen Description BLOOD RIGHT HAND  Final   Special Requests IN PEDIATRIC BOTTLE Blood Culture adequate volume  Final   Culture NO GROWTH 4 DAYS  Final   Report Status PENDING  Incomplete  Culture, blood (routine x 2)     Status: None (Preliminary result)   Collection Time: 04/02/17  1:50 AM  Result Value Ref Range Status   Specimen Description BLOOD RIGHT WRIST  Final   Special Requests   Final    BOTTLES DRAWN AEROBIC AND ANAEROBIC Blood Culture results may not be optimal due to an inadequate volume of blood received in culture bottles   Culture NO GROWTH 3 DAYS  Final   Report Status PENDING  Incomplete  MRSA PCR  Screening     Status: Abnormal   Collection Time: 04/02/17  3:39 AM  Result Value Ref Range Status   MRSA by PCR POSITIVE (A) NEGATIVE Final    Comment:        The GeneXpert MRSA Assay (FDA approved for NASAL specimens only), is one component of a comprehensive MRSA colonization surveillance program. It is not intended to diagnose MRSA infection nor to guide or monitor treatment for MRSA infections. RESULT CALLED TO, READ BACK BY AND VERIFIED WITHAngelina Sheriff: BOKIAGON,C RN [phone] 04/02/17 MITCHELL,L   Surgical pcr screen     Status: None   Collection Time: 04/04/17 10:54 PM  Result Value Ref Range Status  MRSA, PCR NEGATIVE NEGATIVE Final   Staphylococcus aureus NEGATIVE NEGATIVE Final    Comment: (NOTE) The Xpert SA Assay (FDA approved for NASAL specimens in patients 81 years of age and older), is one component of a comprehensive surveillance program. It is not intended to diagnose infection nor to guide or monitor treatment.       Radiology Studies: No results found.   Scheduled Meds: . calcitRIOL  0.25 mcg Oral Q M,W,F-HD  . Chlorhexidine Gluconate Cloth  6 each Topical Q0600  . feeding supplement (ENSURE ENLIVE)  237 mL Oral BID BM  . feeding supplement (PRO-STAT SUGAR FREE 64)  30 mL Oral TID  . heparin  5,000 Units Subcutaneous Q8H  . multivitamin  1 tablet Oral QHS  . mupirocin ointment  1 application Nasal BID   Continuous Infusions: . piperacillin-tazobactam (ZOSYN)  IV 3.375 g (04/06/17 1146)  . vancomycin 1 g (04/05/17 1008)    Albertine Grates, MD, PhD [facility] Pager [phone]  If 7PM-7AM, please contact night-coverage [email or web address] Password [email or web address] 04/06/2017, 2:26 PM

## 2017-04-07 ENCOUNTER — Other Ambulatory Visit: Payer: Self-pay

## 2017-04-07 LAB — RENAL FUNCTION PANEL
Albumin: 1.9 g/dL — ABNORMAL LOW (ref 3.5–5.0)
Anion gap: 11 (ref 5–15)
BUN: 38 mg/dL — ABNORMAL HIGH (ref 6–20)
CHLORIDE: 98 mmol/L — AB (ref 101–111)
CO2: 26 mmol/L (ref 22–32)
CREATININE: 6.96 mg/dL — AB (ref 0.61–1.24)
Calcium: 8.3 mg/dL — ABNORMAL LOW (ref 8.9–10.3)
GFR, EST AFRICAN AMERICAN: 8 mL/min — AB (ref >60)
GFR, EST NON AFRICAN AMERICAN: 7 mL/min — AB (ref >60)
Glucose, Bld: 78 mg/dL (ref 65–99)
POTASSIUM: 3.6 mmol/L (ref 3.5–5.1)
Phosphorus: 5.6 mg/dL — ABNORMAL HIGH (ref 2.5–4.6)
Sodium: 135 mmol/L (ref 135–145)

## 2017-04-07 LAB — CBC
HEMATOCRIT: 27.5 % — AB (ref 39.0–52.0)
Hemoglobin: 8.7 g/dL — ABNORMAL LOW (ref 13.0–17.0)
MCH: 27.9 pg (ref 26.0–34.0)
MCHC: 31.6 g/dL (ref 30.0–36.0)
MCV: 88.1 fL (ref 78.0–100.0)
Platelets: 280 10*3/uL (ref 150–400)
RBC: 3.12 MIL/uL — AB (ref 4.22–5.81)
RDW: 16.5 % — ABNORMAL HIGH (ref 11.5–15.5)
WBC: 11.9 10*3/uL — AB (ref 4.0–10.5)

## 2017-04-07 LAB — CULTURE, BLOOD (ROUTINE X 2): Culture: NO GROWTH

## 2017-04-07 MED ORDER — LIDOCAINE-PRILOCAINE 2.5-2.5 % EX CREA: 1.0000 "application " | TOPICAL_CREAM | CUTANEOUS | Status: DC | PRN

## 2017-04-07 MED ORDER — VANCOMYCIN HCL IN DEXTROSE 1-5 GM/200ML-% IV SOLN
1000.0000 mg | INTRAVENOUS | Status: AC
  Administered 2017-04-09: 1000 mg via INTRAVENOUS
  Filled 2017-04-07: qty 200

## 2017-04-07 MED ORDER — FENTANYL CITRATE (PF) 100 MCG/2ML IJ SOLN
INTRAMUSCULAR | Status: AC
  Filled 2017-04-07: qty 2

## 2017-04-07 MED ORDER — SODIUM CHLORIDE 0.9 % IV SOLN: 100.0000 mL | INTRAVENOUS | Status: DC | PRN

## 2017-04-07 MED ORDER — HEPARIN SODIUM (PORCINE) 1000 UNIT/ML DIALYSIS: 7000.0000 [IU] | Freq: Once | INTRAMUSCULAR | Status: DC

## 2017-04-07 MED ORDER — ALTEPLASE 2 MG IJ SOLR: 2.0000 mg | Freq: Once | INTRAMUSCULAR | Status: DC | PRN

## 2017-04-07 MED ORDER — VANCOMYCIN HCL IN DEXTROSE 1-5 GM/200ML-% IV SOLN
INTRAVENOUS | Status: AC
  Filled 2017-04-07: qty 200

## 2017-04-07 MED ORDER — VANCOMYCIN HCL IN DEXTROSE 1-5 GM/200ML-% IV SOLN: 1000.0000 mg | INTRAVENOUS | Status: DC

## 2017-04-07 MED ORDER — HEPARIN SODIUM (PORCINE) 1000 UNIT/ML DIALYSIS: 1000.0000 [IU] | INTRAMUSCULAR | Status: DC | PRN

## 2017-04-07 MED ORDER — LIDOCAINE HCL (PF) 1 % IJ SOLN: 5.0000 mL | INTRAMUSCULAR | Status: DC | PRN

## 2017-04-07 MED ORDER — FERRIC CITRATE 1 GM 210 MG(FE) PO TABS
210.0000 mg | ORAL_TABLET | Freq: Three times a day (TID) | ORAL | Status: DC
  Administered 2017-04-07: 210 mg via ORAL
  Filled 2017-04-07 (×3): qty 1

## 2017-04-07 MED ORDER — PENTAFLUOROPROP-TETRAFLUOROETH EX AERO: 1.0000 "application " | INHALATION_SPRAY | CUTANEOUS | Status: DC | PRN

## 2017-04-07 MED ORDER — CALCITRIOL 0.25 MCG PO CAPS
ORAL_CAPSULE | ORAL | Status: AC
Start: 2017-04-07 — End: 2017-04-07
  Filled 2017-04-07: qty 1

## 2017-04-07 MED ORDER — DARBEPOETIN ALFA 60 MCG/0.3ML IJ SOSY
PREFILLED_SYRINGE | INTRAMUSCULAR | Status: AC
  Filled 2017-04-07: qty 0.3

## 2017-04-07 NOTE — Progress Notes (Signed)
PROGRESS NOTE  Ian BlakeWarren J Rayner BJY:[id number] DOB: 05/06/1936 DOA: 04/01/2017 PCP: Crist FatVan Eyk, Jason, MD   LOS: 5 days   Brief Narrative / Interim history: 81 year old male with ESRD, hypertension, right AKA, presented with necrosis of toes on the left feet.  Patient also had been missing dialysis for 1 week, was sent by HD center due to worsening necrosis.  Assessment & Plan: Principal Problem:   Evaluation by psychiatric service required Active Problems:   End stage renal disease (HCC)   Gangrene of left foot (HCC)   Cellulitis of left foot   Dementia   DNR (do not resuscitate) discussion   Palliative care by specialist   Sacral wound, subsequent encounter   Goals of care -Discussed with family on 11/15, continue medical/surgical care for his necrotic toe and sacral decubitus.  CODE STATUS changed to DNR.    Left toes necrosis/gangrene / PVD -Patient is afebrile, mild leukocytosis -Currently on vancomycin and Zosyn, continue, consulted vascular surgery to evaluate for amputation as his toes are unlikely to heal with conservative management -It appears that patient will undergo surgery on Monday  Dementia -Psychiatry consulted, patient does not have capacity to make medical decisions   ESRD on HD mwf -nephrology consulted, HD today (holiday schedule)  Sacral decubitus ulcer  -Surgery consulted, will need debridement surgery, likely to happen on Monday   DVT prophylaxis: heparin Code Status: DNR Family Communication: No family at bedside this morning Disposition Plan: TBD  Consultants:   Palliative  Nephrology  Psychiatry   General surgery (sacral decubitus ulcer)  Vascular surgery ( left foot)  Procedures:   None   Antimicrobials:  Vancomycin 11/12 >>   Zosyn 11/12 >>  Subjective: -laying in bed, NAD, report  left foot pain.  No chest pain, no shortness of breath  Objective: Vitals:   04/06/17 2226 04/07/17 0405 04/07/17 0700 04/07/17 0730  BP:  (!) 100/34 (!) 111/50  (!) 110/47  Pulse: 71 79 80 70  Resp: 15 16 18 18   Temp: 98.9 F (37.2 C) 99.5 F (37.5 C) 98.4 F (36.9 C)   TempSrc: Oral Oral Oral   SpO2: 100% 100%    Weight: 100 kg (220 lb 7.4 oz)     Height:        Intake/Output Summary (Last 24 hours) at 04/07/2017 0809 Last data filed at 04/07/2017 0600 Gross per 24 hour  Intake 230 ml  Output 1 ml  Net 229 ml   Filed Weights   04/05/17 0830 04/05/17 1206 04/06/17 2226  Weight: 123.4 kg (272 lb 0.8 oz) 121 kg (266 lb 12.1 oz) 100 kg (220 lb 7.4 oz)    Examination:  Constitutional: NAD, not oriented to time, know he is at [facility].  Respiratory: CTA Cardiovascular: RRR Extremity: s/p RAKA, dry gangrene 2-4th toes left foot.  Dialysis Access: LFA AVG cannulated at present.    Data Reviewed: I have independently reviewed following labs and imaging studies   CBC: Recent Labs  Lab 04/01/17 2145 04/03/17 0346 04/04/17 0542 04/05/17 0516 04/07/17 0709  WBC 14.9* 11.1* 12.2* 11.6* 11.9*  NEUTROABS 13.5*  --   --   --   --   HGB 11.1* 9.3* 9.4* 8.9* 8.7*  HCT 34.2* 29.6* 30.5* 28.7* 27.5*  MCV 88.6 88.4 90.0 88.0 88.1  PLT 287 242 279 286 280   Basic Metabolic Panel: Recent Labs  Lab 04/01/17 2145 04/03/17 0900 04/04/17 0542 04/05/17 0516  NA 135 134* 137 134*  K 3.7 4.0  3.6 3.5  CL 91* 93* 98* 94*  CO2 26 27 28 27   GLUCOSE 90 83 90 81  BUN 40* 53* 26* 44*  CREATININE 9.62* 11.05* 5.80* 7.75*  CALCIUM 8.8* 8.5* 8.4* 8.4*  PHOS  --  6.9*  --   --    GFR: Estimated Creatinine Clearance: 9.2 mL/min (A) (by C-G formula based on SCr of 7.75 mg/dL (H)). Liver Function Tests: Recent Labs  Lab 04/01/17 2145 04/03/17 0900  AST 26  --   ALT 18  --   ALKPHOS 105  --   BILITOT 0.8  --   PROT 7.1  --   ALBUMIN 2.7* 2.1*   No results for input(s): LIPASE, AMYLASE in the last 168 hours. No results for input(s): AMMONIA in the last 168 hours. Coagulation Profile: Recent Labs  Lab  04/01/17 2145  INR 1.30   Cardiac Enzymes: No results for input(s): CKTOTAL, CKMB, CKMBINDEX, TROPONINI in the last 168 hours. BNP (last 3 results) No results for input(s): PROBNP in the last 8760 hours. HbA1C: No results for input(s): HGBA1C in the last 72 hours. CBG: Recent Labs  Lab 04/01/17 1953 04/01/17 2040 04/02/17 1044 04/02/17 1114  GLUCAP 55* 76 68 106*   Lipid Profile: No results for input(s): CHOL, HDL, LDLCALC, TRIG, CHOLHDL, LDLDIRECT in the last 72 hours. Thyroid Function Tests: No results for input(s): TSH, T4TOTAL, FREET4, T3FREE, THYROIDAB in the last 72 hours. Anemia Panel: No results for input(s): VITAMINB12, FOLATE, FERRITIN, TIBC, IRON, RETICCTPCT in the last 72 hours. Urine analysis:    Component Value Date/Time   COLORURINE YELLOW 12/26/2016 0024   APPEARANCEUR CLOUDY (A) 12/26/2016 0024   LABSPEC 1.016 12/26/2016 0024   PHURINE 8.0 12/26/2016 0024   GLUCOSEU NEGATIVE 12/26/2016 0024   HGBUR SMALL (A) 12/26/2016 0024   BILIRUBINUR NEGATIVE 12/26/2016 0024   KETONESUR NEGATIVE 12/26/2016 0024   PROTEINUR 100 (A) 12/26/2016 0024   UROBILINOGEN 0.2 03/08/2011 0253   NITRITE NEGATIVE 12/26/2016 0024   LEUKOCYTESUR LARGE (A) 12/26/2016 0024   Sepsis Labs: Invalid input(s): PROCALCITONIN, LACTICIDVEN  Recent Results (from the past 240 hour(s))  Culture, blood (routine x 2)     Status: None   Collection Time: 04/01/17  9:45 PM  Result Value Ref Range Status   Specimen Description BLOOD RIGHT HAND  Final   Special Requests IN PEDIATRIC BOTTLE Blood Culture adequate volume  Final   Culture NO GROWTH 5 DAYS  Final   Report Status 04/06/2017 FINAL  Final  Culture, blood (routine x 2)     Status: None (Preliminary result)   Collection Time: 04/02/17  1:50 AM  Result Value Ref Range Status   Specimen Description BLOOD RIGHT WRIST  Final   Special Requests   Final    BOTTLES DRAWN AEROBIC AND ANAEROBIC Blood Culture results may not be optimal due to  an inadequate volume of blood received in culture bottles   Culture NO GROWTH 4 DAYS  Final   Report Status PENDING  Incomplete  MRSA PCR Screening     Status: Abnormal   Collection Time: 04/02/17  3:39 AM  Result Value Ref Range Status   MRSA by PCR POSITIVE (A) NEGATIVE Final    Comment:        The GeneXpert MRSA Assay (FDA approved for NASAL specimens only), is one component of a comprehensive MRSA colonization surveillance program. It is not intended to diagnose MRSA infection nor to guide or monitor treatment for MRSA infections. RESULT CALLED TO, READ  BACK BY AND VERIFIED WITHAngelina Sheriff: BOKIAGON,C RN [id number] 04/02/17 MITCHELL,L   Surgical pcr screen     Status: None   Collection Time: 04/04/17 10:54 PM  Result Value Ref Range Status   MRSA, PCR NEGATIVE NEGATIVE Final   Staphylococcus aureus NEGATIVE NEGATIVE Final    Comment: (NOTE) The Xpert SA Assay (FDA approved for NASAL specimens in patients 79 years of age and older), is one component of a comprehensive surveillance program. It is not intended to diagnose infection nor to guide or monitor treatment.       Radiology Studies: No results found.   Scheduled Meds: . calcitRIOL      . calcitRIOL  0.25 mcg Oral Q M,W,F-HD  . Darbepoetin Alfa      . feeding supplement (ENSURE ENLIVE)  237 mL Oral BID BM  . feeding supplement (PRO-STAT SUGAR FREE 64)  30 mL Oral TID  . fentaNYL      . heparin  5,000 Units Subcutaneous Q8H  . [START ON 04/08/2017] heparin  7,000 Units Dialysis Once in dialysis  . multivitamin  1 tablet Oral QHS   Continuous Infusions: . sodium chloride    . sodium chloride    . piperacillin-tazobactam (ZOSYN)  IV Stopped (04/07/17 0622)  . vancomycin    . vancomycin 1,000 mg (04/07/17 0719)    Albertine GratesFang Jayani Rozman, MD, PhD [facility] Pager [phone]  If 7PM-7AM, please contact night-coverage [email or web address] Password [email or web address] 04/07/2017, 8:09 AM

## 2017-04-07 NOTE — Progress Notes (Signed)
Pharmacy Antibiotic Note  [name] is a 81 y.o. male admitted on 04/01/2017 with wound infection.  Pharmacy has been consulted for Vancomycin and Zosyn dosing.   The patient is ESRD-MWF however is off-schedule this week due to the holidays. The patient received HD today (11/18) with plans for HD again 11/20 then back to MWF on 11/23.  Noted tentative plans for sacral wound I&D and L-BKA +/- diverting colostomy tentatively on Monday, 11/19.  Plan: 1. Vancomycin 1g post HD off-schedule 11/18 + 11/20 then resume 1g IV with HD-MWF starting 11/23 2. Continue Zosyn 3.375g IV q12h EI 3. Will continue to follow HD schedule/duration, culture results, LOT, and antibiotic de-escalation plans   Height: 5\' 10"  (177.8 cm) Weight: 173 lb 1 oz (78.5 kg) IBW/kg (Calculated) : 73  Temp (24hrs), Avg:98.5 F (36.9 C), Min:98 F (36.7 C), Max:99.5 F (37.5 C)  Recent Labs  Lab 04/01/17 2145 04/01/17 2158 04/03/17 0346 04/03/17 0900 04/04/17 0542 04/05/17 0516 04/07/17 0709  WBC 14.9*  --  11.1*  --  12.2* 11.6* 11.9*  CREATININE 9.62*  --   --  11.05* 5.80* 7.75* 6.96*  LATICACIDVEN  --  1.94*  --   --   --   --   --     Estimated Creatinine Clearance: 8.9 mL/min (A) (by C-G formula based on SCr of 6.96 mg/dL (H)).    Allergies  Allergen Reactions  . Iohexol Swelling and Rash  . Zanaflex [Tizanidine] Other (See Comments)    Shut kidneys down Unable to walk or void  . Ezetimibe-Simvastatin Other (See Comments)    Elevated liver enzymes  . Fluvastatin Other (See Comments)    Elevated liver enzymes  . Simvastatin Other (See Comments)    Elevated liver enzymes    Vanc 11/12>> Zosyn 11/12>>  11/12 @ 2145 BCx: ngtd 11/13 @ 0150 BCx: ngtd 11/13 MRSA PCR: pos 11/15 MRSA PCR >> neg  Thank you for allowing pharmacy to be a part of this patient's care.  Georgina PillionElizabeth Aubrie Lucien, PharmD, BCPS Clinical Pharmacist Pager: [phone] Clinical phone for 04/07/2017 from 7a-3:30p: [phone] If  after 3:30p, please call main pharmacy at: [phone] 04/07/2017 2:08 PM

## 2017-04-07 NOTE — Progress Notes (Signed)
[facility] Progress Note   Subjective: No C/Os. On HD.   Objective Vitals:   04/07/17 1000 04/07/17 1030 04/07/17 1046 04/07/17 1138  BP: (!) 108/36 (!) 143/41 (!) 126/53 (!) 118/43  Pulse: 77 74 75 78  Resp:   18 18  Temp:   98 F (36.7 C) 98.1 F (36.7 C)  TempSrc:   Oral Oral  SpO2:   100% 100%  Weight:   100 kg (220 lb 7.4 oz)   Height:       Physical Exam General: Chronically ill appearing male in NAD Heart: S1,S2 RRR Lungs: CTAB A/P Abdomen: Active BS  Extremities: R AKA. Evidence of dry with wet gangrene 2-4th L toes. Edema and demarcation L foot.  Dialysis Access: LFA AVG cannulated at present.    Additional Objective Labs: Basic Metabolic Panel: Recent Labs  Lab 04/03/17 0900 04/04/17 0542 04/05/17 0516 04/07/17 0709  NA 134* 137 134* 135  K 4.0 3.6 3.5 3.6  CL 93* 98* 94* 98*  CO2 27 28 27 26   GLUCOSE 83 90 81 78  BUN 53* 26* 44* 38*  CREATININE 11.05* 5.80* 7.75* 6.96*  CALCIUM 8.5* 8.4* 8.4* 8.3*  PHOS 6.9*  --   --  5.6*   Liver Function Tests: Recent Labs  Lab 04/01/17 2145 04/03/17 0900 04/07/17 0709  AST 26  --   --   ALT 18  --   --   ALKPHOS 105  --   --   BILITOT 0.8  --   --   PROT 7.1  --   --   ALBUMIN 2.7* 2.1* 1.9*   No results for input(s): LIPASE, AMYLASE in the last 168 hours. CBC: Recent Labs  Lab 04/01/17 2145 04/03/17 0346 04/04/17 0542 04/05/17 0516 04/07/17 0709  WBC 14.9* 11.1* 12.2* 11.6* 11.9*  NEUTROABS 13.5*  --   --   --   --   HGB 11.1* 9.3* 9.4* 8.9* 8.7*  HCT 34.2* 29.6* 30.5* 28.7* 27.5*  MCV 88.6 88.4 90.0 88.0 88.1  PLT 287 242 279 286 280   Blood Culture    Component Value Date/Time   SDES BLOOD RIGHT WRIST 04/02/2017 0150   SPECREQUEST  04/02/2017 0150    BOTTLES DRAWN AEROBIC AND ANAEROBIC Blood Culture results may not be optimal due to an inadequate volume of blood received in culture bottles   CULT NO GROWTH 5 DAYS 04/02/2017 0150   REPTSTATUS 04/07/2017 FINAL  04/02/2017 0150    Cardiac Enzymes: No results for input(s): CKTOTAL, CKMB, CKMBINDEX, TROPONINI in the last 168 hours. CBG: Recent Labs  Lab 04/01/17 1953 04/01/17 2040 04/02/17 1044 04/02/17 1114  GLUCAP 55* 76 68 106*   Iron Studies: No results for input(s): IRON, TIBC, TRANSFERRIN, FERRITIN in the last 72 hours. @lablastinr3 @ Studies/Results: No results found. Medications: . piperacillin-tazobactam (ZOSYN)  IV Stopped (04/07/17 0622)  . vancomycin 1,000 mg (04/07/17 0719)   . calcitRIOL  0.25 mcg Oral Q M,W,F-HD  . feeding supplement (ENSURE ENLIVE)  237 mL Oral BID BM  . feeding supplement (PRO-STAT SUGAR FREE 64)  30 mL Oral TID  . heparin  5,000 Units Subcutaneous Q8H  . multivitamin  1 tablet Oral QHS   Dialysis Orders: MWF [facility] 4h 15min   86kg  Hep 8500  2/2 bath   L AVG Parsibiv 7.5 mcg IV TIW (not on hospital formulary) Mircera 100 mcg IV Q 2 weeks (last dose 03/20/17 Last HGB 10.3 03/20/17) Calcitriol 0.25 mcg PO TIW  BMD meds: No binders on OP med list  Assessment: 1. Gangrene L Foot / necrotic sacral decub: d/w VVS on call, anticipating trip to OR tomorrow. IV abx.  2. ESRD - HD MWF. HD today on holiday schedule.  3. Volume - no vol excess on exam. Not eating. Wt's are off, needs a new bed or re-zero this one 4. Anemia  - HGB down to 8.9. Give Aranesp 40 mcg IV with HD tomorrow.  5. Metabolic bone disease -  Renal profile tomorrow with HD. Cont VDRA, unable to give parsibiv while in patient. Add binder after checking Phos.  6. Nutrition - Albumin 2.7. Change to renal diet/Fld restrictions. Prostat and renal vit. 7. Dementia: Psych noted not competent to make decisions.   Plan - as above  Vinson Moselleob Rossi Silvestro MD [facility] pager [phone]   04/07/2017, 11:57 AM

## 2017-04-07 NOTE — Progress Notes (Deleted)
Subjective:  tolerated hd today on holiday schedule =  SUN, TUES, FRI/ for   Objective Vital signs in last 24 hours: Vitals:   04/07/17 1030 04/07/17 1046 04/07/17 1138 04/07/17 1249  BP: (!) 143/41 (!) 126/53 (!) 118/43   Pulse: 74 75 78   Resp:  18 18   Temp:  98 F (36.7 C) 98.1 F (36.7 C)   TempSrc:  Oral Oral   SpO2:  100% 100%   Weight:  100 kg (220 lb 7.4 oz)  78.5 kg (173 lb 1 oz)  Height:       Weight change: -23.4 kg (-9.4 oz)  Physical Exam: General: Awoken from sleep NAD, Chronically ill AAM Heart:  RRR, no mur , fub or gal Lungs: CTA bilat  Abdomen: Soft, NT, Nd , pos bs Extremities: R AKA Stump  no edema / L leg trace pedla edema with Gangrenous  toes 2-4th L toes and and demarcation L foot  Dialysis Access: LUA AVF pos. bruit   Dialysis Orders:MWF [facility] 4h 15min 86kg Hep 8500 2/2 bath L AVG Parsibiv 7.5 mcg IV TIW (not on hospital formulary) Mircera 100 mcg IV Q 2 weeks (last dose 03/20/17 Last HGB 10.3 03/20/17) Calcitriol 0.25 mcg PO TIW  BMD meds: No binders on OP med list   Problem/Plan: 1. Gangrene L Foot / necrotic sacral decub: d/w VVS on call, anticipating trip to OR  Monday 04/08/17. IV abx.  2. ESRD - HDMWF. HD today on holiday schedule.  3. Volume/HTN  -  bp stable /no vol excess on exam. HD  Today kept even  Not eating. Bed  Wt's are off,7.5 kg down per bed wt. 4. Anemia - HGB down to 8.9. Give Aranesp 60 mcg IV with HD today given  5. Metabolic bone disease -  corec ca 9.9  Phos 5.6Cont VDRA, unable to give parsibiv while in patient. Start Auryxia as binder 1 ac  6. Nutrition - Albumin 2.7>1.9 . Change to renal diet/Fld restrictions. Prostat and renal vit. And on Ensure supplements 7. Dementia: Psych noted not competent to make decisions. Missed HD for 1 week  Pre admit     Ian Pastelavid Jaskarn Schweer, PA-C [facility] Beeper [phone] 04/07/2017,1:51 PM  LOS: 5 days   Labs: Basic Metabolic Panel: Recent Labs  Lab  04/03/17 0900 04/04/17 0542 04/05/17 0516 04/07/17 0709  NA 134* 137 134* 135  K 4.0 3.6 3.5 3.6  CL 93* 98* 94* 98*  CO2 27 28 27 26   GLUCOSE 83 90 81 78  BUN 53* 26* 44* 38*  CREATININE 11.05* 5.80* 7.75* 6.96*  CALCIUM 8.5* 8.4* 8.4* 8.3*  PHOS 6.9*  --   --  5.6*   Liver Function Tests: Recent Labs  Lab 04/01/17 2145 04/03/17 0900 04/07/17 0709  AST 26  --   --   ALT 18  --   --   ALKPHOS 105  --   --   BILITOT 0.8  --   --   PROT 7.1  --   --   ALBUMIN 2.7* 2.1* 1.9*    CBC: Recent Labs  Lab 04/01/17 2145 04/03/17 0346 04/04/17 0542 04/05/17 0516 04/07/17 0709  WBC 14.9* 11.1* 12.2* 11.6* 11.9*  NEUTROABS 13.5*  --   --   --   --   HGB 11.1* 9.3* 9.4* 8.9* 8.7*  HCT 34.2* 29.6* 30.5* 28.7* 27.5*  MCV 88.6 88.4 90.0 88.0 88.1  PLT 287 242 279 286 280   Cardiac Enzymes:  No results for input(s): CKTOTAL, CKMB, CKMBINDEX, TROPONINI in the last 168 hours. CBG: Recent Labs  Lab 04/01/17 1953 04/01/17 2040 04/02/17 1044 04/02/17 1114  GLUCAP 55* 76 68 106*    Studies/Results: No results found. Medications: . piperacillin-tazobactam (ZOSYN)  IV 3.375 g (04/07/17 1225)  . vancomycin 1,000 mg (04/07/17 0719)   . calcitRIOL  0.25 mcg Oral Q M,W,F-HD  . feeding supplement (ENSURE ENLIVE)  237 mL Oral BID BM  . feeding supplement (PRO-STAT SUGAR FREE 64)  30 mL Oral TID  . heparin  5,000 Units Subcutaneous Q8H  . multivitamin  1 tablet Oral QHS

## 2017-04-08 ENCOUNTER — Encounter (HOSPITAL_COMMUNITY)
Admission: EM | Disposition: A | Discharge status: Other Acute Inpatient Hospital | Payer: Self-pay | Source: Home / Self Care | Attending: Internal Medicine

## 2017-04-08 ENCOUNTER — Inpatient Hospital Stay (HOSPITAL_COMMUNITY): Payer: Medicare Other | Admitting: Anesthesiology

## 2017-04-08 ENCOUNTER — Encounter (HOSPITAL_COMMUNITY): Payer: Self-pay

## 2017-04-08 DIAGNOSIS — I70262 Atherosclerosis of native arteries of extremities with gangrene, left leg: Secondary | ICD-10-CM

## 2017-04-08 DIAGNOSIS — Z992 Dependence on renal dialysis: Secondary | ICD-10-CM

## 2017-04-08 DIAGNOSIS — N186 End stage renal disease: Secondary | ICD-10-CM

## 2017-04-08 HISTORY — PX: AMPUTATION: SHX166

## 2017-04-08 HISTORY — PX: DEBRIDMENT OF DECUBITUS ULCER: SHX6276

## 2017-04-08 LAB — RENAL FUNCTION PANEL
Albumin: 1.9 g/dL — ABNORMAL LOW (ref 3.5–5.0)
Anion gap: 11 (ref 5–15)
BUN: 15 mg/dL (ref 6–20)
CO2: 26 mmol/L (ref 22–32)
Calcium: 8.6 mg/dL — ABNORMAL LOW (ref 8.9–10.3)
Chloride: 99 mmol/L — ABNORMAL LOW (ref 101–111)
Creatinine, Ser: 5.33 mg/dL — ABNORMAL HIGH (ref 0.61–1.24)
GFR calc Af Amer: 11 mL/min — ABNORMAL LOW (ref >60)
GFR calc non Af Amer: 9 mL/min — ABNORMAL LOW (ref >60)
Glucose, Bld: 95 mg/dL (ref 65–99)
Phosphorus: 5.2 mg/dL — ABNORMAL HIGH (ref 2.5–4.6)
Potassium: 3.9 mmol/L (ref 3.5–5.1)
Sodium: 136 mmol/L (ref 135–145)

## 2017-04-08 LAB — CBC
HCT: 30.6 % — ABNORMAL LOW (ref 39.0–52.0)
Hemoglobin: 9.3 g/dL — ABNORMAL LOW (ref 13.0–17.0)
MCH: 27.6 pg (ref 26.0–34.0)
MCHC: 30.4 g/dL (ref 30.0–36.0)
MCV: 90.8 fL (ref 78.0–100.0)
Platelets: 260 K/uL (ref 150–400)
RBC: 3.37 MIL/uL — ABNORMAL LOW (ref 4.22–5.81)
RDW: 16.7 % — ABNORMAL HIGH (ref 11.5–15.5)
WBC: 11.2 K/uL — ABNORMAL HIGH (ref 4.0–10.5)

## 2017-04-08 LAB — GLUCOSE, CAPILLARY: GLUCOSE-CAPILLARY: 72 mg/dL (ref 65–99)

## 2017-04-08 LAB — SURGICAL PCR SCREEN
MRSA, PCR: NEGATIVE
STAPHYLOCOCCUS AUREUS: NEGATIVE

## 2017-04-08 LAB — TYPE AND SCREEN
ABO/RH(D): O POS
Antibody Screen: NEGATIVE

## 2017-04-08 LAB — ABO/RH: ABO/RH(D): O POS

## 2017-04-08 SURGERY — DEBRIDEMENT, WOUND
Anesthesia: General

## 2017-04-08 SURGERY — AMPUTATION, ABOVE KNEE
Anesthesia: General | Site: Leg Upper

## 2017-04-08 MED ORDER — LIDOCAINE-PRILOCAINE 2.5-2.5 % EX CREA: 1.0000 "application " | TOPICAL_CREAM | CUTANEOUS | Status: DC | PRN

## 2017-04-08 MED ORDER — MEPERIDINE HCL 25 MG/ML IJ SOLN: 6.2500 mg | INTRAMUSCULAR | Status: DC | PRN

## 2017-04-08 MED ORDER — FENTANYL CITRATE (PF) 100 MCG/2ML IJ SOLN
INTRAMUSCULAR | Status: AC
  Administered 2017-04-08: 50 ug via INTRAVENOUS
  Filled 2017-04-08: qty 2

## 2017-04-08 MED ORDER — ONDANSETRON HCL 4 MG/2ML IJ SOLN: 4.0000 mg | Freq: Once | INTRAMUSCULAR | Status: DC | PRN

## 2017-04-08 MED ORDER — PHENYLEPHRINE HCL 10 MG/ML IJ SOLN
INTRAVENOUS | Status: DC | PRN
  Administered 2017-04-08: 40 ug/min via INTRAVENOUS
  Administered 2017-04-08: 30 ug/min via INTRAVENOUS

## 2017-04-08 MED ORDER — ONDANSETRON HCL 4 MG/2ML IJ SOLN
INTRAMUSCULAR | Status: DC | PRN
  Administered 2017-04-08: 4 mg via INTRAVENOUS

## 2017-04-08 MED ORDER — SUCCINYLCHOLINE CHLORIDE 20 MG/ML IJ SOLN
INTRAMUSCULAR | Status: DC | PRN
  Administered 2017-04-08: 120 mg via INTRAVENOUS

## 2017-04-08 MED ORDER — FENTANYL CITRATE (PF) 100 MCG/2ML IJ SOLN
25.0000 ug | INTRAMUSCULAR | Status: DC | PRN
  Administered 2017-04-08: 50 ug via INTRAVENOUS

## 2017-04-08 MED ORDER — SEVELAMER CARBONATE 800 MG PO TABS
800.0000 mg | ORAL_TABLET | Freq: Three times a day (TID) | ORAL | Status: DC
  Administered 2017-04-09 – 2017-04-11 (×6): 800 mg via ORAL
  Filled 2017-04-08 (×7): qty 1

## 2017-04-08 MED ORDER — PROPOFOL 10 MG/ML IV BOLUS
INTRAVENOUS | Status: AC
  Filled 2017-04-08: qty 20

## 2017-04-08 MED ORDER — PENTAFLUOROPROP-TETRAFLUOROETH EX AERO: 1.0000 "application " | INHALATION_SPRAY | CUTANEOUS | Status: DC | PRN

## 2017-04-08 MED ORDER — PROMETHAZINE HCL 25 MG/ML IJ SOLN: 6.2500 mg | INTRAMUSCULAR | Status: DC | PRN

## 2017-04-08 MED ORDER — FENTANYL CITRATE (PF) 100 MCG/2ML IJ SOLN: 25.0000 ug | INTRAMUSCULAR | Status: DC | PRN

## 2017-04-08 MED ORDER — PROPOFOL 10 MG/ML IV BOLUS
INTRAVENOUS | Status: DC | PRN
  Administered 2017-04-08: 130 mg via INTRAVENOUS

## 2017-04-08 MED ORDER — SODIUM CHLORIDE 0.9 % IV SOLN: 100.0000 mL | INTRAVENOUS | Status: DC | PRN

## 2017-04-08 MED ORDER — LACTATED RINGERS IV SOLN: INTRAVENOUS | Status: DC

## 2017-04-08 MED ORDER — FENTANYL CITRATE (PF) 250 MCG/5ML IJ SOLN
INTRAMUSCULAR | Status: AC
  Filled 2017-04-08: qty 5

## 2017-04-08 MED ORDER — 0.9 % SODIUM CHLORIDE (POUR BTL) OPTIME
TOPICAL | Status: DC | PRN
  Administered 2017-04-08 (×2): 1000 mL

## 2017-04-08 MED ORDER — LIDOCAINE HCL (CARDIAC) 20 MG/ML IV SOLN
INTRAVENOUS | Status: DC | PRN
  Administered 2017-04-08: 60 mg via INTRATRACHEAL

## 2017-04-08 MED ORDER — LIDOCAINE HCL (PF) 1 % IJ SOLN: 5.0000 mL | INTRAMUSCULAR | Status: DC | PRN

## 2017-04-08 MED ORDER — SODIUM CHLORIDE 0.9 % IV SOLN
INTRAVENOUS | Status: DC
  Administered 2017-04-08 (×2): via INTRAVENOUS

## 2017-04-08 MED ORDER — FENTANYL CITRATE (PF) 250 MCG/5ML IJ SOLN
INTRAMUSCULAR | Status: DC | PRN
  Administered 2017-04-08 (×4): 50 ug via INTRAVENOUS

## 2017-04-08 MED ORDER — DEXAMETHASONE SODIUM PHOSPHATE 10 MG/ML IJ SOLN
INTRAMUSCULAR | Status: DC | PRN
  Administered 2017-04-08: 5 mg via INTRAVENOUS

## 2017-04-08 SURGICAL SUPPLY — 54 items
BANDAGE ACE 4X5 VEL STRL LF (GAUZE/BANDAGES/DRESSINGS) ×4 IMPLANT
BANDAGE ACE 6X5 VEL STRL LF (GAUZE/BANDAGES/DRESSINGS) ×4 IMPLANT
BANDAGE ELASTIC 4 VELCRO ST LF (GAUZE/BANDAGES/DRESSINGS) ×2 IMPLANT
BANDAGE ELASTIC 6 VELCRO ST LF (GAUZE/BANDAGES/DRESSINGS) ×2 IMPLANT
BLADE CLIPPER SURG (BLADE) IMPLANT
BLADE SAW GIGLI 510 (BLADE) ×3 IMPLANT
BLADE SAW GIGLI 510MM (BLADE) ×1
BNDG COHESIVE 6X5 TAN STRL LF (GAUZE/BANDAGES/DRESSINGS) ×4 IMPLANT
BNDG GAUZE ELAST 4 BULKY (GAUZE/BANDAGES/DRESSINGS) ×4 IMPLANT
CANISTER SUCT 3000ML PPV (MISCELLANEOUS) ×8 IMPLANT
CLIP VESOCCLUDE MED 6/CT (CLIP) ×4 IMPLANT
COVER SURGICAL LIGHT HANDLE (MISCELLANEOUS) ×8 IMPLANT
DRAIN CHANNEL 19F RND (DRAIN) IMPLANT
DRAPE HALF SHEET 40X57 (DRAPES) ×4 IMPLANT
DRAPE ORTHO SPLIT 77X108 STRL (DRAPES) ×8
DRAPE SURG ORHT 6 SPLT 77X108 (DRAPES) ×4 IMPLANT
DRSG ADAPTIC 3X8 NADH LF (GAUZE/BANDAGES/DRESSINGS) ×4 IMPLANT
ELECT CAUTERY BLADE 6.4 (BLADE) ×8 IMPLANT
ELECT REM PT RETURN 9FT ADLT (ELECTROSURGICAL) ×8
ELECTRODE REM PT RTRN 9FT ADLT (ELECTROSURGICAL) ×4 IMPLANT
GAUZE SPONGE 4X4 12PLY STRL (GAUZE/BANDAGES/DRESSINGS) ×6 IMPLANT
GLOVE BIO SURGEON STRL SZ7.5 (GLOVE) ×4 IMPLANT
GLOVE EUDERMIC 7 POWDERFREE (GLOVE) ×4 IMPLANT
GOWN STRL REUS W/ TWL LRG LVL3 (GOWN DISPOSABLE) ×8 IMPLANT
GOWN STRL REUS W/ TWL XL LVL3 (GOWN DISPOSABLE) ×2 IMPLANT
GOWN STRL REUS W/TWL LRG LVL3 (GOWN DISPOSABLE) ×16
GOWN STRL REUS W/TWL XL LVL3 (GOWN DISPOSABLE) ×4
KIT BASIN OR (CUSTOM PROCEDURE TRAY) ×8 IMPLANT
KIT ROOM TURNOVER OR (KITS) ×8 IMPLANT
NS IRRIG 1000ML POUR BTL (IV SOLUTION) ×8 IMPLANT
PACK GENERAL/GYN (CUSTOM PROCEDURE TRAY) ×4 IMPLANT
PACK SURGICAL SETUP 50X90 (CUSTOM PROCEDURE TRAY) ×4 IMPLANT
PAD ABD 8X10 STRL (GAUZE/BANDAGES/DRESSINGS) ×2 IMPLANT
PAD ARMBOARD 7.5X6 YLW CONV (MISCELLANEOUS) ×16 IMPLANT
PENCIL BUTTON HOLSTER BLD 10FT (ELECTRODE) ×4 IMPLANT
SPECIMEN JAR SMALL (MISCELLANEOUS) ×4 IMPLANT
STAPLER VISISTAT 35W (STAPLE) ×4 IMPLANT
STOCKINETTE IMPERVIOUS LG (DRAPES) ×4 IMPLANT
SUT ETHILON 3 0 PS 1 (SUTURE) IMPLANT
SUT SILK 0 TIES 10X30 (SUTURE) ×4 IMPLANT
SUT SILK 2 0 (SUTURE) ×4
SUT SILK 2-0 18XBRD TIE 12 (SUTURE) ×2 IMPLANT
SUT SILK 3 0 (SUTURE)
SUT SILK 3-0 18XBRD TIE 12 (SUTURE) IMPLANT
SUT VIC AB 2-0 CT1 18 (SUTURE) ×10 IMPLANT
SYR CONTROL 10ML LL (SYRINGE) ×4 IMPLANT
TOWEL GREEN STERILE (TOWEL DISPOSABLE) ×4 IMPLANT
TOWEL OR 17X24 6PK STRL BLUE (TOWEL DISPOSABLE) ×4 IMPLANT
TOWEL OR 17X26 10 PK STRL BLUE (TOWEL DISPOSABLE) ×4 IMPLANT
TUBE CONNECTING 12'X1/4 (SUCTIONS) ×1
TUBE CONNECTING 12X1/4 (SUCTIONS) ×3 IMPLANT
UNDERPAD 30X30 (UNDERPADS AND DIAPERS) ×4 IMPLANT
WATER STERILE IRR 1000ML POUR (IV SOLUTION) ×4 IMPLANT
YANKAUER SUCT BULB TIP NO VENT (SUCTIONS) ×4 IMPLANT

## 2017-04-08 NOTE — Progress Notes (Signed)
  Progress Note    04/08/2017 1:18 PM Day of Surgery  Subjective: no acute changes  Vitals:   04/08/17 0609 04/08/17 0725  BP: (!) 121/44 (!) 120/53  Pulse: 74 72  Resp: 19 18  Temp: 98.1 F (36.7 C) 98.2 F (36.8 C)  SpO2: 100% 100%    Physical Exam: Mental status unchanged Dry gangrenous changes of left foot  CBC    Component Value Date/Time   WBC 11.9 (H) 04/07/2017 0709   RBC 3.12 (L) 04/07/2017 0709   HGB 8.7 (L) 04/07/2017 0709   HCT 27.5 (L) 04/07/2017 0709   PLT 280 04/07/2017 0709   MCV 88.1 04/07/2017 0709   MCH 27.9 04/07/2017 0709   MCHC 31.6 04/07/2017 0709   RDW 16.5 (H) 04/07/2017 0709   LYMPHSABS 0.7 04/01/2017 2145   MONOABS 0.6 04/01/2017 2145   EOSABS 0.1 04/01/2017 2145   BASOSABS 0.0 04/01/2017 2145    BMET    Component Value Date/Time   NA 135 04/07/2017 0709   K 3.6 04/07/2017 0709   CL 98 (L) 04/07/2017 0709   CO2 26 04/07/2017 0709   GLUCOSE 78 04/07/2017 0709   BUN 38 (H) 04/07/2017 0709   CREATININE 6.96 (H) 04/07/2017 0709   CALCIUM 8.3 (L) 04/07/2017 0709   CALCIUM 8.4 11/29/2007 1515   GFRNONAA 7 (L) 04/07/2017 0709   GFRAA 8 (L) 04/07/2017 0709    INR    Component Value Date/Time   INR 1.30 04/01/2017 2145     Intake/Output Summary (Last 24 hours) at 04/08/2017 1318 Last data filed at 04/08/2017 0900 Gross per 24 hour  Intake 700 ml  Output 0 ml  Net 700 ml     Assessment:  81 y.o. male is here with left foot gangrene.   Plan: Left aka today in conjunction with sacral debridement.    Margarie Mcguirt C. Randie Heinzain, MD [facility] [place] Office: [phone] Pager: [phone]  04/08/2017 1:18 PM

## 2017-04-08 NOTE — Progress Notes (Signed)
Wife, Clare CharonDoris Tollison, was called per Dr. Jacinto HalimIngram's request.  She will be here between 8 am and 10 am.  I advised for her to let the secretary know when she got here and we would page Dr. Derrell LollingIngram.  Bernie CoveyKimberly Adianna Darwin RN-BC, [facility]

## 2017-04-08 NOTE — Transfer of Care (Signed)
Immediate Anesthesia Transfer of Care Note  Patient: [name]  Procedure(s) Performed: AMPUTATION ABOVE KNEE LEFT (Left Leg Upper) DEBRIDMENT OF Sacral Wound (N/A Buttocks)  Patient Location: PACU  Anesthesia Type:General  Level of Consciousness: awake, alert  and oriented  Airway & Oxygen Therapy: Patient Spontanous Breathing and Patient connected to nasal cannula oxygen  Post-op Assessment: Report given to RN, Post -op Vital signs reviewed and stable and Patient moving all extremities X 4  Post vital signs: Reviewed and stable  Last Vitals:  Vitals:   04/08/17 0725 04/08/17 1548  BP: (!) 120/53   Pulse: 72 87  Resp: 18   Temp: 36.8 C   SpO2: 100%     Last Pain:  Vitals:   04/08/17 0725  TempSrc: Oral  PainSc:       Patients Stated Pain Goal: 0 (04/07/17 2257)  Complications: No apparent anesthesia complications

## 2017-04-08 NOTE — Op Note (Signed)
    Patient name: Ian BlakeWarren J Fuhrmann MRN: [id number] DOB: 06/16/1935 Sex: male  04/08/2017 Pre-operative Diagnosis: esrd, critical left lower extremity ischemia with gangrene of toes Post-operative diagnosis:  Same Surgeon:  Luanna SalkBrandon C. Randie Heinzain, MD Assistant: Aggie MoatsMatt Eveland, PA Procedure Performed:  Left above knee amputation  Indications:  81yo male s/p right aka now with dry gangrene of left toes and has had palate of care consult and is indicated for left above-knee amputation.  Findings: There is adequate bleeding in the wound bed.  Anterior posterior flaps approximated well.  Patient tolerated procedure well.   Procedure:  The patient was identified in the holding area and taken to the operating room where initially he underwent debridement of his sacral decubitus ulcer.  For details of that operation please see Dr. Delon SacramentoHaywood Ingram's procedural note.  Following this he was placed supine on the operating table with he had already been on antibiotics he was sterilely prepped and draped in the left lower extremity and a timeout was called.  I traced out a fishmouth type incision above his left knee.  This was then traced with 10 blade and carried down to the level of the bone with Bovie electrocautery.  Periosteum was elevated the bone was transected with a Gigli saw with an anterior bevel.  The blood vessels were then clamped posterior flap was created with amputation knife.  We then suture ligated the blood vessels and the nerve was pulled on tension and ligated with Vicryl suture and divided.  We then irrigated the wound copiously obtained hemostasis and the wound closed our flaps together with 2-0 Vicryl suture.  Clips were placed to the level of the skin.  Patient tolerated procedure well without immediate complication.  All counts were correct at completion.   EBL: 200cc  Candice Tobey C. Randie Heinzain, MD [facility] [place] Office: [phone] Pager: [phone]
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
## 2017-04-08 NOTE — H&P (View-Only) (Signed)
General Surgery:  I am assuming general surgical care for this patient this morning.  I met him and he is alert, in no distress, but with very poor insight.  Nursing staff says that he has had several soft stools and no nausea or vomiting.  He has end-stage renal disease(last dialysis yesterday per RN), , dementia,hypertension, diabetes, stage IV sacral decubitus  ulcer  ,ischemic necrosis of left foot, history right AKA.coronary artery disease, history CABG.  Laparoscopic colon resection is listed in his surgical history but he thinks he had a gallbladder operation. I cannot find a record of an abdominal operation in Epic. He has been seen by psychiatry and they state he does not have the capacity to make medical decisions.  Abdomen is soft.  Nondistended.  Upper midline incision noted to be healed.  No abdominal masses.   Assessment/plan:     He is scheduled for left AKA by Dr. Donzetta Matters, and we are scheduled to debride his sacral decubitus ulcer, both to be done later today.     The question that has been mentioned, but not addressed, is whether to proceed with diverting colostomy at the same time.     Before embarking on diverting colostomy, I will try to have a conversation with the family and POA this morning and also I will need to get some idea about prior abdominal colonic surgery.  I have discussed with the RN this morning.  She is going to contact the family to arrange a conference this morning after breakfast.  Addendum: (11:00 a.m.)The patient's family met with Brigid Re, PA.  They adamantly decline for him to have a colostomy.  They understand the advantages and risks of colostomy formation.  They simply do not think that he would want this.  This will increase bedside wound care and sepsis risk. .  Colostomy may become necessary later.    Therefore, today, he will be taken to the operating room for left AKA and debridement of sacral decubitus ulcer.     Edsel Petrin. Dalbert Batman, M.D.,  [facility] General and Minimally invasive Surgery Breast and Colorectal Surgery Office:   [phone] Pager:   [phone]

## 2017-04-08 NOTE — Op Note (Signed)
Patient Name:           [name]   Date of Surgery:        04/08/2017  Pre op Diagnosis:      Stage IV sacral decubitus ulcer  Post op Diagnosis:    Stage IV sacral decubitus ulcer  Procedure:                 Debridement sacral decubitus ulcer   1.  Progress note or procedure note with a detailed description of the procedure.  2.  Tool used for debridement (curette, scapel, etc.)  knife, electrocautery  3.  Frequency of surgical debridement.   Initial debridement  4.  Measurement of total devitalized tissue (wound surface) before and after surgical debridement.   13 cm vertically by 11 cm transversely by 2 cm deep  5.  Area and depth of devitalized tissue removed from wound.  13 cm vertically by 11 cm transversely by 2 cm deep  6.  Blood loss and description of tissue removed.  EBL 30-40 mL.                                                                                  Skin, subcutaneous tissue, muscle fascia  7.  Evidence of the progress of the wound's response to treatment.  A.  Current wound volume (current dimensions and depth).  13 X 11 X 3 cm.  B.  Presence (and extent of) of infection.  Grossly infected  C.  Presence (and extent of) of non viable tissue.  The entire debrided tissue was nonviable  D.  Other material in the wound that is expected to inhibit healing.  N/A  8.  Was there any viable tissue removed (measurements): Minimal   Surgeon:                     Angelia MouldHaywood M. Derrell LollingIngram, M.D., FACS  Assistant:                      OR staff  Operative Indications:   This is a 81 year old gentleman with end-stage renal disease on dialysis, dementia, hypertension, diabetes, atherosclerotic peripheral vascular disease, coronary artery disease, history CABG.  History laparoscopic colon resection with unknown details.  He was admitted with ischemic necrosis of his left foot and left above-knee amputation is planned by Dr. Randie Heinzain.  He was found to have a large stage IV sacral  decubitus ulcer, described in detail above.  His family consented to debridement of the ulcer but after repeated discussions declined to allow diverting colostomy to assist wound hygiene.  He is brought to the operating room for his above-knee amputation and debridement of the sacral decubitus ulcer  Operative Findings:       The appearance and dimensions of the wound are described above  Procedure in Detail:          Following the induction of general endotracheal anesthesia the patient's was placed in a prone jackknife position.  Perianal area was cleansed with warm water and dried.  The wound was then painted with ChloraPrep.  Appropriate draping was placed.  Surgical timeout was performed.  Using a 10 blade and electrocautery I debrided the entirety of the decubitus ulcer one large specimen and after you smaller specimens.  I took almost all of the debridement down to bleeding tissue.  Hemostasis was excellent and achieved electrocautery.  When I got into the deeper infected area I took cultures.  The wound was irrigated and then packed with a saline moistened's Curlex.  Cover with dry Kerlix, ABDs, and fishnet panties.  The patient tolerated the procedure well.  EBL 30-40 mL.  Counts correct.  At this point the case the patient was turned supine and the above-knee amputation will be dictated by Dr. Randie Heinzain.     Angelia MouldHaywood M. Derrell LollingIngram, M.D., FACS General and Minimally Invasive Surgery Breast and Colorectal Surgery  04/08/2017 2:38 PM

## 2017-04-08 NOTE — Anesthesia Preprocedure Evaluation (Addendum)
Anesthesia Evaluation  Patient identified by MRN, date of birth, ID band Patient awake    Reviewed: Allergy & Precautions, NPO status , Patient's Chart, lab work & pertinent test results  Airway Mallampati: II  TM Distance: >3 FB Neck ROM: Full    Dental  (+) Edentulous Lower, Edentulous Upper   Pulmonary neg pulmonary ROS,    Pulmonary exam normal breath sounds clear to auscultation       Cardiovascular hypertension, + CAD, + Past MI, + CABG, + Peripheral Vascular Disease and +CHF  Normal cardiovascular exam Rhythm:Regular Rate:Normal     Neuro/Psych PSYCHIATRIC DISORDERS negative neurological ROS     GI/Hepatic negative GI ROS, Neg liver ROS,   Endo/Other  diabetes  Renal/GU ESRF and DialysisRenal disease     Musculoskeletal  (+) Arthritis ,   Abdominal   Peds  Hematology  (+) Blood dyscrasia, anemia ,   Anesthesia Other Findings Day of surgery medications reviewed with the patient.  Reproductive/Obstetrics                            Lab Results  Component Value Date   WBC 11.9 (H) 04/07/2017   HGB 8.7 (L) 04/07/2017   HCT 27.5 (L) 04/07/2017   MCV 88.1 04/07/2017   PLT 280 04/07/2017   Lab Results  Component Value Date   CREATININE 6.96 (H) 04/07/2017   BUN 38 (H) 04/07/2017   NA 135 04/07/2017   K 3.6 04/07/2017   CL 98 (L) 04/07/2017   CO2 26 04/07/2017   Lab Results  Component Value Date   INR 1.30 04/01/2017   INR 1.10 01/01/2017   INR 1.3 12/04/2008   EKG: normal sinus rhythm.  Anesthesia Physical Anesthesia Plan  ASA: III  Anesthesia Plan: General   Post-op Pain Management:    Induction: Intravenous  PONV Risk Score and Plan: 3 and Dexamethasone, Ondansetron and Treatment may vary due to age or medical condition  Airway Management Planned: Oral ETT  Additional Equipment:   Intra-op Plan:   Post-operative Plan: Possible Post-op  intubation/ventilation  Informed Consent: I have reviewed the patients History and Physical, chart, labs and discussed the procedure including the risks, benefits and alternatives for the proposed anesthesia with the patient or authorized representative who has indicated his/her understanding and acceptance.   Dental advisory given  Plan Discussed with: CRNA  Anesthesia Plan Comments:        Anesthesia Quick Evaluation

## 2017-04-08 NOTE — Progress Notes (Signed)
[facility] Progress Note   Subjective: No C/Os. Has head covered, sleeping. "im doing OK"  Objective Vitals:   04/07/17 1843 04/07/17 2128 04/08/17 0609 04/08/17 0725  BP: (!) 120/44 109/87 (!) 121/44 (!) 120/53  Pulse: 72 78 74 72  Resp: 18 20 19 18   Temp: 98.2 F (36.8 C) 98.7 F (37.1 C) 98.1 F (36.7 C) 98.2 F (36.8 C)  TempSrc: Oral Oral Oral Oral  SpO2: 100% 100% 100% 100%  Weight:      Height:       Physical Exam General: Chronically Ill appearing male, lethargic NAD Heart: S1,S2 RRR Lungs: CTAB A./P Abdomen: Skin: Sacral decub-drsg being changed per WOC.  Extremities: R AKA. Evidence of dry with wet gangrene 2-4th L toes. Edema and demarcation L foot.  Dialysis Access: LFA AVG + bruit  Dialysis Orders:  Additional Objective Labs: Basic Metabolic Panel: Recent Labs  Lab 04/03/17 0900 04/04/17 0542 04/05/17 0516 04/07/17 0709  NA 134* 137 134* 135  K 4.0 3.6 3.5 3.6  CL 93* 98* 94* 98*  CO2 27 28 27 26   GLUCOSE 83 90 81 78  BUN 53* 26* 44* 38*  CREATININE 11.05* 5.80* 7.75* 6.96*  CALCIUM 8.5* 8.4* 8.4* 8.3*  PHOS 6.9*  --   --  5.6*   Liver Function Tests: Recent Labs  Lab 04/01/17 2145 04/03/17 0900 04/07/17 0709  AST 26  --   --   ALT 18  --   --   ALKPHOS 105  --   --   BILITOT 0.8  --   --   PROT 7.1  --   --   ALBUMIN 2.7* 2.1* 1.9*   No results for input(s): LIPASE, AMYLASE in the last 168 hours. CBC: Recent Labs  Lab 04/01/17 2145 04/03/17 0346 04/04/17 0542 04/05/17 0516 04/07/17 0709  WBC 14.9* 11.1* 12.2* 11.6* 11.9*  NEUTROABS 13.5*  --   --   --   --   HGB 11.1* 9.3* 9.4* 8.9* 8.7*  HCT 34.2* 29.6* 30.5* 28.7* 27.5*  MCV 88.6 88.4 90.0 88.0 88.1  PLT 287 242 279 286 280   Blood Culture    Component Value Date/Time   SDES BLOOD RIGHT WRIST 04/02/2017 0150   SPECREQUEST  04/02/2017 0150    BOTTLES DRAWN AEROBIC AND ANAEROBIC Blood Culture results may not be optimal due to an inadequate volume of  blood received in culture bottles   CULT NO GROWTH 5 DAYS 04/02/2017 0150   REPTSTATUS 04/07/2017 FINAL 04/02/2017 0150    Cardiac Enzymes: No results for input(s): CKTOTAL, CKMB, CKMBINDEX, TROPONINI in the last 168 hours. CBG: Recent Labs  Lab 04/01/17 1953 04/01/17 2040 04/02/17 1044 04/02/17 1114  GLUCAP 55* 76 68 106*   Iron Studies: No results for input(s): IRON, TIBC, TRANSFERRIN, FERRITIN in the last 72 hours. @lablastinr3 @ Studies/Results: No results found. Medications: . piperacillin-tazobactam (ZOSYN)  IV Stopped (04/08/17 0507)  . [START ON 04/09/2017] vancomycin    . [START ON 04/12/2017] vancomycin     . calcitRIOL  0.25 mcg Oral Q M,W,F-HD  . feeding supplement (ENSURE ENLIVE)  237 mL Oral BID BM  . feeding supplement (PRO-STAT SUGAR FREE 64)  30 mL Oral TID  . ferric citrate  210 mg Oral TID WC  . heparin  5,000 Units Subcutaneous Q8H  . multivitamin  1 tablet Oral QHS     Dialysis Orders:MWF [facility] 4h 15min 86kg Hep 8500 2/2 bath L AVG Parsibiv 7.5 mcg IV TIW (not  on hospital formulary) Mircera 100 mcg IV Q 2 weeks (last dose 03/20/17 Last HGB 10.3 03/20/17) Calcitriol 0.25 mcg PO TIW  BMD meds: No binders on OP med list  Assessment: 1. Gangrene L Foot / necrotic sacral decub: Scheduled for L AKA per Dr. Randie Heinzain today.  2. Sacral decubitus: General surgery consulted. To OR for debridement today. Questionable diverting ostomy placement if family agrees.  3. ESRD - HDMWF. HD tomorrow on holiday schedule- will beSun/Tues and Friday. Last K+ 3.6 use 4.0 K bath. No heparin.  4. Volume - BP controlled and stable. HD 11/18 Pre wt 100 kg Net UF 500 Post wt 100 kg.  Wt inaccurate. Get different bed scale while in OR today. HD tomorrow. Call NP for UFG after scale repaired.  5. Anemia - HGB 8.7 11/18. Aranesp 60 mcg IV 04/07/17. Follow HGB.  6. Metabolic bone disease - Ca 8.3 C Ca 9.98  Hold VDRA, unable to give parsibiv while in patient. Phos 5.6 Renvela  800 mg PO TID AC when able to eat.  7. Nutrition - Albumin 1.9. NPO at present. Prostat and renal vit. 8. Dementia: Psych noted not competent to make decisions.    [name] NP-C 04/08/2017, 8:46 AM  [facility] [phone]  Patient seen and examined, agree with above note with above modifications. Somnolent but says is OK- not sure he knows what he is in for.  Palliative care involved- is DNR but wanting to move forward with surgical interventions and presumable to continue with HD Annie SableKellie Alora Gorey, MD 04/08/2017

## 2017-04-08 NOTE — Interval H&P Note (Signed)
History and Physical Interval Note:  04/08/2017 12:54 PM  [name]  has presented today for surgery, with the diagnosis of GANGRENE OF LEFT FOOT  The various methods of treatment have been discussed with the patient and family. After consideration of risks, benefits and other options for treatment, the patient and his family  has consented to  Procedure(s): AMPUTATION ABOVE KNEE LEFT (Left) DEBRIDMENT OF Sacral Wound (N/A) as a surgical intervention .   Dr. Randie Heinzain to perform the amputation.  Dr. Derrell LollingIngram to perform the debridement.  The patient's history has been reviewed, patient examined, no change in status, stable for surgery.  I have reviewed the patient's chart and labs.  Questions were answered to the patient's satisfaction.     Ernestene MentionHaywood M Jonay Hitchcock

## 2017-04-08 NOTE — Progress Notes (Addendum)
General Surgery:  I am assuming general surgical care for this patient this morning.  I met him and he is alert, in no distress, but with very poor insight.  Nursing staff says that he has had several soft stools and no nausea or vomiting.  He has end-stage renal disease(last dialysis yesterday per RN), , dementia,hypertension, diabetes, stage IV sacral decubitus  ulcer  ,ischemic necrosis of left foot, history right AKA.coronary artery disease, history CABG.  Laparoscopic colon resection is listed in his surgical history but he thinks he had a gallbladder operation. I cannot find a record of an abdominal operation in Epic. He has been seen by psychiatry and they state he does not have the capacity to make medical decisions.  Abdomen is soft.  Nondistended.  Upper midline incision noted to be healed.  No abdominal masses.   Assessment/plan:     He is scheduled for left AKA by Dr. Donzetta Matters, and we are scheduled to debride his sacral decubitus ulcer, both to be done later today.     The question that has been mentioned, but not addressed, is whether to proceed with diverting colostomy at the same time.     Before embarking on diverting colostomy, I will try to have a conversation with the family and POA this morning and also I will need to get some idea about prior abdominal colonic surgery.  I have discussed with the RN this morning.  She is going to contact the family to arrange a conference this morning after breakfast.  Addendum: (11:00 a.m.)The patient's family met with Brigid Re, PA.  They adamantly decline for him to have a colostomy.  They understand the advantages and risks of colostomy formation.  They simply do not think that he would want this.  This will increase bedside wound care and sepsis risk. .  Colostomy may become necessary later.    Therefore, today, he will be taken to the operating room for left AKA and debridement of sacral decubitus ulcer.     Edsel Petrin. Dalbert Batman, M.D.,  [facility] General and Minimally invasive Surgery Breast and Colorectal Surgery Office:   [phone] Pager:   [phone]

## 2017-04-08 NOTE — Anesthesia Postprocedure Evaluation (Signed)
Anesthesia Post Note  Patient: [name]  Procedure(s) Performed: AMPUTATION ABOVE KNEE LEFT (Left Leg Upper) DEBRIDMENT OF Sacral Wound (N/A Buttocks)     Patient location during evaluation: PACU Anesthesia Type: General Level of consciousness: awake and alert Pain management: pain level controlled Vital Signs Assessment: post-procedure vital signs reviewed and stable Respiratory status: spontaneous breathing, nonlabored ventilation, respiratory function stable and patient connected to nasal cannula oxygen Cardiovascular status: blood pressure returned to baseline and stable Postop Assessment: no apparent nausea or vomiting Anesthetic complications: no    Last Vitals:  Vitals:   04/08/17 1645 04/08/17 1719  BP:  (!) 119/47  Pulse: 75   Resp: 11   Temp:  (!) 36.4 C  SpO2: 100% 97%    Last Pain:  Vitals:   04/08/17 1719  TempSrc: Oral  PainSc:                  Shelton SilvasKevin D Tyree Fluharty

## 2017-04-08 NOTE — Anesthesia Procedure Notes (Signed)
Procedure Name: Intubation Date/Time: 04/08/2017 1:41 PM Performed by: Marena ChancyBeckner, Kiano Terrien S, CRNA Pre-anesthesia Checklist: Patient identified, Emergency Drugs available, Suction available and Patient being monitored Patient Re-evaluated:Patient Re-evaluated prior to induction Oxygen Delivery Method: Circle System Utilized Preoxygenation: Pre-oxygenation with 100% oxygen Induction Type: IV induction Ventilation: Mask ventilation without difficulty Laryngoscope Size: Miller and 3 Grade View: Grade II Tube type: Oral Tube size: 7.5 mm Number of attempts: 1 Airway Equipment and Method: Stylet and Oral airway Placement Confirmation: ETT inserted through vocal cords under direct vision,  positive ETCO2 and breath sounds checked- equal and bilateral Tube secured with: Tape Dental Injury: Teeth and Oropharynx as per pre-operative assessment

## 2017-04-09 ENCOUNTER — Encounter (HOSPITAL_COMMUNITY): Payer: Self-pay | Admitting: Vascular Surgery

## 2017-04-09 DIAGNOSIS — Z7189 Other specified counseling: Secondary | ICD-10-CM

## 2017-04-09 LAB — CBC
HCT: 28.2 % — ABNORMAL LOW (ref 39.0–52.0)
Hemoglobin: 8.6 g/dL — ABNORMAL LOW (ref 13.0–17.0)
MCH: 27.4 pg (ref 26.0–34.0)
MCHC: 30.5 g/dL (ref 30.0–36.0)
MCV: 89.8 fL (ref 78.0–100.0)
Platelets: 288 10*3/uL (ref 150–400)
RBC: 3.14 MIL/uL — ABNORMAL LOW (ref 4.22–5.81)
RDW: 16.5 % — ABNORMAL HIGH (ref 11.5–15.5)
WBC: 10.8 10*3/uL — ABNORMAL HIGH (ref 4.0–10.5)

## 2017-04-09 LAB — RENAL FUNCTION PANEL
Albumin: 1.8 g/dL — ABNORMAL LOW (ref 3.5–5.0)
Anion gap: 11 (ref 5–15)
BUN: 22 mg/dL — ABNORMAL HIGH (ref 6–20)
CO2: 25 mmol/L (ref 22–32)
Calcium: 8.7 mg/dL — ABNORMAL LOW (ref 8.9–10.3)
Chloride: 101 mmol/L (ref 101–111)
Creatinine, Ser: 6.43 mg/dL — ABNORMAL HIGH (ref 0.61–1.24)
GFR calc Af Amer: 8 mL/min — ABNORMAL LOW (ref >60)
GFR calc non Af Amer: 7 mL/min — ABNORMAL LOW (ref >60)
Glucose, Bld: 79 mg/dL (ref 65–99)
Phosphorus: 6.3 mg/dL — ABNORMAL HIGH (ref 2.5–4.6)
Potassium: 4 mmol/L (ref 3.5–5.1)
Sodium: 137 mmol/L (ref 135–145)

## 2017-04-09 MED ORDER — OXYCODONE HCL 5 MG PO TABS
5.0000 mg | ORAL_TABLET | ORAL | Status: DC | PRN
  Administered 2017-04-09 – 2017-04-11 (×5): 10 mg via ORAL
  Filled 2017-04-09 (×6): qty 2

## 2017-04-09 MED ORDER — MORPHINE SULFATE (PF) 2 MG/ML IV SOLN
1.0000 mg | INTRAVENOUS | Status: DC | PRN
  Administered 2017-04-10 (×2): 2 mg via INTRAVENOUS
  Filled 2017-04-09 (×3): qty 1

## 2017-04-09 MED ORDER — FENTANYL CITRATE (PF) 100 MCG/2ML IJ SOLN
INTRAMUSCULAR | Status: AC
  Filled 2017-04-09: qty 2

## 2017-04-09 MED ORDER — VANCOMYCIN HCL IN DEXTROSE 1-5 GM/200ML-% IV SOLN
INTRAVENOUS | Status: AC
  Administered 2017-04-09: 1000 mg via INTRAVENOUS
  Filled 2017-04-09: qty 200

## 2017-04-09 MED ORDER — COLLAGENASE 250 UNIT/GM EX OINT
TOPICAL_OINTMENT | Freq: Every day | CUTANEOUS | Status: DC
  Administered 2017-04-09 – 2017-04-11 (×3): via TOPICAL
  Filled 2017-04-09: qty 30

## 2017-04-09 NOTE — Progress Notes (Addendum)
  Progress Note    04/09/2017 10:29 AM 1 Day Post-Op  Subjective:  C/o pain but says it is getting better.  Received pain med at 0830  afebrile Vitals:   04/09/17 0930 04/09/17 1000  BP: (!) 96/45 (!) 107/46  Pulse: 72 73  Resp:    Temp:    SpO2:      Physical Exam: Incisions:  Bandage is clean    CBC    Component Value Date/Time   WBC 10.8 (H) 04/09/2017 0800   RBC 3.14 (L) 04/09/2017 0800   HGB 8.6 (L) 04/09/2017 0800   HCT 28.2 (L) 04/09/2017 0800   PLT 288 04/09/2017 0800   MCV 89.8 04/09/2017 0800   MCH 27.4 04/09/2017 0800   MCHC 30.5 04/09/2017 0800   RDW 16.5 (H) 04/09/2017 0800   LYMPHSABS 0.7 04/01/2017 2145   MONOABS 0.6 04/01/2017 2145   EOSABS 0.1 04/01/2017 2145   BASOSABS 0.0 04/01/2017 2145    BMET    Component Value Date/Time   NA 137 04/09/2017 0800   K 4.0 04/09/2017 0800   CL 101 04/09/2017 0800   CO2 25 04/09/2017 0800   GLUCOSE 79 04/09/2017 0800   BUN 22 (H) 04/09/2017 0800   CREATININE 6.43 (H) 04/09/2017 0800   CALCIUM 8.7 (L) 04/09/2017 0800   CALCIUM 8.4 11/29/2007 1515   GFRNONAA 7 (L) 04/09/2017 0800   GFRAA 8 (L) 04/09/2017 0800    INR    Component Value Date/Time   INR 1.30 04/01/2017 2145     Intake/Output Summary (Last 24 hours) at 04/09/2017 1029 Last data filed at 04/09/2017 0601 Gross per 24 hour  Intake 747.33 ml  Output 252 ml  Net 495.33 ml     Assessment/Plan:  81 y.o. male is s/p left above knee amputation  1 Day Post-Op  -pt doing well on HD -bandage is clean and dry - will remove dressing tomorrow -continue percocet for pain control   Doreatha MassedSamantha Rhyne, PA-C [facility] [phone] 04/09/2017 10:29 AM    I have independently interviewed and examiend patient and agree with PA assessment and plan above.   Brandon C. Randie Heinzain, MD [facility] [place] Office: [phone] Pager: [phone]

## 2017-04-09 NOTE — Progress Notes (Signed)
[facility] Progress Note   Subjective: seen in HD- had AKA yesterday- tolerated well  Objective Vitals:   04/09/17 0825 04/09/17 0830 04/09/17 0900 04/09/17 0930  BP: (!) 100/53 (!) 120/50 (!) 107/50 (!) 96/45  Pulse: 73 73 74 72  Resp:      Temp:      TempSrc:      SpO2:      Weight:      Height:       Physical Exam General: Chronically Ill appearing male, lethargic NAD Heart: S1,S2 RRR Lungs: CTAB A./P Abdomen: Skin: Sacral decub-drsg being changed per WOC.  Extremities: R AKA. New left AKA Dialysis Access: LFA AVG + bruit  Dialysis Orders:  Additional Objective Labs: Basic Metabolic Panel: Recent Labs  Lab 04/07/17 0709 04/08/17 1844 04/09/17 0800  NA 135 136 137  K 3.6 3.9 4.0  CL 98* 99* 101  CO2 '26 26 25  '$ GLUCOSE 78 95 79  BUN 38* 15 22*  CREATININE 6.96* 5.33* 6.43*  CALCIUM 8.3* 8.6* 8.7*  PHOS 5.6* 5.2* 6.3*   Liver Function Tests: Recent Labs  Lab 04/07/17 0709 04/08/17 1844 04/09/17 0800  ALBUMIN 1.9* 1.9* 1.8*   No results for input(s): LIPASE, AMYLASE in the last 168 hours. CBC: Recent Labs  Lab 04/04/17 0542 04/05/17 0516 04/07/17 0709 04/08/17 1844 04/09/17 0800  WBC 12.2* 11.6* 11.9* 11.2* 10.8*  HGB 9.4* 8.9* 8.7* 9.3* 8.6*  HCT 30.5* 28.7* 27.5* 30.6* 28.2*  MCV 90.0 88.0 88.1 90.8 89.8  PLT 279 286 280 260 288   Blood Culture    Component Value Date/Time   SDES WOUND 04/08/2017 1519   SPECREQUEST LEFT FOOT GANGRENE 04/08/2017 1519   CULT PENDING 04/08/2017 1519   REPTSTATUS PENDING 04/08/2017 1519    Cardiac Enzymes: No results for input(s): CKTOTAL, CKMB, CKMBINDEX, TROPONINI in the last 168 hours. CBG: Recent Labs  Lab 04/02/17 1044 04/02/17 1114 04/08/17 1550  GLUCAP 68 106* 72   Iron Studies: No results for input(s): IRON, TIBC, TRANSFERRIN, FERRITIN in the last 72 hours. '@lablastinr3'$ @ Studies/Results: No results found. Medications: . sodium chloride    . sodium chloride    . sodium  chloride 10 mL/hr at 04/08/17 1303  . piperacillin-tazobactam (ZOSYN)  IV Stopped (04/09/17 0218)  . vancomycin    . [START ON 04/12/2017] vancomycin     . feeding supplement (ENSURE ENLIVE)  237 mL Oral BID BM  . feeding supplement (PRO-STAT SUGAR FREE 64)  30 mL Oral TID  . heparin  5,000 Units Subcutaneous Q8H  . multivitamin  1 tablet Oral QHS  . sevelamer carbonate  800 mg Oral TID WC     Dialysis Orders:MWF [facility] 4h 64mn 86kg Hep 8500 2/2 bath L AVG Parsibiv 7.5 mcg IV TIW (not on hospital formulary) Mircera 100 mcg IV Q 2 weeks (last dose 03/20/17 Last HGB 10.3 03/20/17) Calcitriol 0.25 mcg PO TIW  BMD meds: No binders on OP med list  Assessment: 1. Gangrene L Foot / necrotic sacral decub: s/p  L AKA per Dr. CDonzetta Matters11/19 2. Sacral decubitus: General surgery consulted. To OR for debridement also 11/19. Questionable diverting ostomy placement if family agrees.  3. ESRD - HDMWF. HD tomorrow on holiday schedule- will beSun/Tues and Friday. Last K+ 3.6 use 4.0 K bath. No heparin.  4. Volume - BP controlled and stable.   Wt inaccurate initially , now 76 under EDW after hosp/AKA.  HD today- minimal removal due to BP. 5. Anemia - HGB  8.6 after surg 11/18. Aranesp 60 mcg IV 04/07/17. Follow HGB.  6. Metabolic bone disease - Ca OK Hold VDRA, unable to give parsibiv while in patient. Phos 6.3 Renvela 800 mg PO TID AC when able to eat. Not the biggest issue 7. Nutrition - Albumin 1.9. NPO at present. Prostat and renal vit. 8. Dementia: Psych noted not competent to make decisions. Family has met with pall care,  Is DNR but moving forward with AKA and debridement of sacral ulcer    Corliss Parish, MD 04/09/2017
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
## 2017-04-09 NOTE — Consult Note (Addendum)
WOC Nurse wound consult note Vascular team following for assessment and plan of care to left AKA post-op site.  Please refer to their team for further questions.  Reason for Consult: Surgical team following for assessment and plan of care for sacrum, requested to assist with plan of care.  He had surgical debridement on 11/19 for a previously unstageable pressure injury.  After surgery, this is a stage 4 pressure injury; bone is palpable when probed with a swab.  Although outer eschar was removed, inner wound bed still has a significant amt yellow slough interspersed throughout pale red wound bed.  Wound is currently soiled with loose Aquilar stool, and it will be difficult to keep wound clean, since it is located in close proximity to the rectum and the family has declined a colostomy.  Pressure Injury POA: Yes Measurement: 13X12X1.2 cm Drainage (amount, consistency, odor) Mod amt tan drainage with slight odor Periwound: Intact skin surrounding Dressing procedure/placement/frequency: PT to begin hydrotherapy to assist with removal of nonviable tissue.  Santyl for enzymatic debridement.  Air mattress to reduce pressure.  No family members at the dedside to discuss plan of care. Please re-consult if further assistance is needed.  Thank-you,  Cammie Mcgeeawn Letha Mirabal MSN, RN, CWOCN, [place]WCN-AP, CNS [phone]

## 2017-04-09 NOTE — Care Management Note (Addendum)
Case Management Note  Patient Details  Name: [name] MRN: [id number] Date of Birth: 03/19/1936  Subjective/Objective:    CM following for progression and d/c planning.                 Action/Plan: 04/09/2017 Discussing pt plan with medical director, Dr Quillian Quince Aronson. Will ask for pt to be reviewed for LTAC admission . Pt is post op Lt  AKA on 04/08/17 , with hx Rt AKA.   Marland Kitchen. Also post op I&D sacral decubitus, possible hydrotherapy. IV antibiotics continue, and pt remains on HD. Referral made to [facility] for possible admission.    3pm No family in hospital, pt sl confused, this CM attempted to contact pt wife, [name], she is unable to accept messages. Call placed to pt son, [name] who returned the call and ask that the family be able to discuss possible LTAC admission tomorrow when they come to the hospital.  This CM ask him to ask for the CM upon their arrival at the hospital tomorrow. 4pm Informed by [facility] that they are unable to accept this pt. [facility] to speak with this family if needed. Currently running insurance for approval.  Expected Discharge Date:                  Expected Discharge Plan:  Long Term Acute Care (LTAC)  In-House Referral:  NA  Discharge planning Services  CM Consult  Post Acute Care Choice:  NA Choice offered to:  NA  DME Arranged:    DME Agency:     HH Arranged:    HH Agency:     Status of Service:  In process, will continue to follow  If discussed at Long Length of Stay Meetings, dates discussed:    Additional Comments:  [name], [name], RN 04/09/2017, 2:56 PM

## 2017-04-09 NOTE — Progress Notes (Signed)
Daily Progress Note   Patient Name: [name]       Date: 04/09/2017 DOB: 1935-07-28  Age: 81 y.o. MRN#: [id number] Attending Physician: Nita Sells, MD Primary Care Physician: Townsend Roger, MD Admit Date: 04/01/2017  Reason for Consultation/Follow-up: Disposition and Establishing goals of care  Subjective: Pt oriented to self and place, intermittent confusion. Reports mild pain in left leg - says pain medicine is effective.   Length of Stay: 7  Current Medications: Scheduled Meds:  . collagenase   Topical Daily  . feeding supplement (ENSURE ENLIVE)  237 mL Oral BID BM  . feeding supplement (PRO-STAT SUGAR FREE 64)  30 mL Oral TID  . heparin  5,000 Units Subcutaneous Q8H  . multivitamin  1 tablet Oral QHS  . sevelamer carbonate  800 mg Oral TID WC    Continuous Infusions: . sodium chloride 10 mL/hr at 04/08/17 1303  . piperacillin-tazobactam (ZOSYN)  IV 3.375 g (04/09/17 1414)  . [START ON 04/12/2017] vancomycin      PRN Meds: acetaminophen **OR** acetaminophen, fentaNYL (SUBLIMAZE) injection, ondansetron **OR** ondansetron (ZOFRAN) IV  Physical Exam  Constitutional: No distress.  HENT:  Head: Normocephalic and atraumatic.  Cardiovascular: Normal rate.  Pulmonary/Chest: Effort normal and breath sounds normal. No respiratory distress.  Abdominal: Soft. Bowel sounds are normal.  Feet:  Right Foot: amputated Left Foot: amputated Neurological: He is alert. He is disoriented.  Skin: Skin is warm and dry. He is not diaphoretic.  stg 4 sacral decub. L leg in bandage s/p amputation  Psychiatric: His mood appears not anxious. He is withdrawn. Cognition and memory are impaired.            Vital Signs: BP (!) 112/47 (BP Location: Right Arm)   Pulse 71   Temp 98.2 F (36.8  C) (Oral)   Resp 13   Ht '5\' 10"'$  (1.778 m)   Wt 73 kg (160 lb 15 oz)   SpO2 99%   BMI 23.09 kg/m  SpO2: SpO2: 99 % O2 Device: O2 Device: Not Delivered O2 Flow Rate: O2 Flow Rate (L/min): 2 L/min  Intake/output summary:   Intake/Output Summary (Last 24 hours) at 04/09/2017 1536 Last data filed at 04/09/2017 1215 Gross per 24 hour  Intake 247.33 ml  Output 838 ml  Net -590.67 ml   LBM: Last BM Date: 04/07/17 Baseline Weight: Weight: 85.1 kg (187 lb 9.8 oz) Most recent weight: Weight: 73 kg (160 lb 15 oz)       Palliative Assessment/Data: 30%      Patient Active Problem List   Diagnosis Date Noted  . Sacral wound, subsequent encounter   . DNR (do not resuscitate) discussion   . Palliative care by specialist   . Gangrene of left foot ([place]) 04/02/2017  . Cellulitis of left foot 04/02/2017  . Dementia 04/02/2017  . Evaluation by psychiatric service required 04/02/2017  . Pressure injury of skin 02/11/2017  . Gangrene of foot ([place]) 02/10/2017  . Sepsis due to cellulitis ([place]) 01/01/2017  . Hypertension 01/01/2017  . Coronary artery disease 01/01/2017  . CKD (chronic kidney disease) stage V requiring chronic dialysis ([place]) 01/01/2017  . Peripheral vascular disease of lower extremity with ulceration/RLE cellulitis (  [place]) 01/01/2017  . Acute metabolic encephalopathy 90/24/0973  . Diabetes mellitus with complication ([place])   . Hyperkalemia 12/26/2016  . Ulcer of right foot due to type 2 diabetes mellitus ([place]) 12/26/2016  . Cellulitis of right foot 12/26/2016  . Noncompliance of patient with renal dialysis ([place]) 12/26/2016  . End stage renal disease ([place]) 09/09/2013    Palliative Care Assessment & Plan   HPI: 81 y.o. male admitted on 04/01/2017 with medical history significant of dementia, ESRD, MI, DM, HTN, AKA of R leg back in Sept this year. Patient also has necrosis of toes on his L foot, recommendation in the past has been AKA but patient has refused. This  admission he was deemed not to have capacity to make decisions by psych. Surgery of sacral wound debridement and left AKA completed 04/08/17.  Patient discharged from SNF to home after last admission and there is some question regarding how care plan played out, he missed all dialysis treatments, sacral wound developed/ appeared and he was readmitted to the hospital.    Palliative care consulted to assist family with treatment option decisions, advanced directive decisions and anticipatory care needs.  Assessment: Met with patient at bedside. He knows where he is and who he is but is unable to participate in conversation regarding goals of care or disposition. He tells me his left leg is in pain. Denies any other pain, shortness of breath, or nausea. Spoke with wife who was thankful for palliative involvement. She is hopeful patient can be placed at [facility] soon. She tells me about how difficult the amputations have been for her and her husband. We discussed the option of diverting colostomy for wound care and she confirms what has been told to other clinicians - she would never want this for him, even if complications in wound care arose in the future. She says "we must maintain some dignity for him".   Recommendations/Plan:  Discharge to [facility] when medically appropriate  No diverting colostomy now or in future  Goals of Care and Additional Recommendations:  Limitations on Scope of Treatment: Full Scope Treatment, DNR, no colostomy  Code Status:  DNR  Prognosis:   Unable to determine  Discharge Planning:  [facility]  Care plan was discussed with patient, nurse, wife, case manager  Thank you for allowing the Palliative Medicine Team to assist in the care of this patient.   Total Time 25 minutes Prolonged Time Billed  no       Greater than 50%  of this time was spent counseling and coordinating care related to the above assessment and plan.  Juel Burrow, DNP,  AGNP-C Palliative Medicine Team Team Phone # [phone]

## 2017-04-09 NOTE — Procedures (Signed)
Patient was seen on dialysis and the procedure was supervised.  BFR 400  Via AVG BP is  107/46.   Patient appears to be tolerating treatment well  Murat Rideout A 04/09/2017

## 2017-04-09 NOTE — Progress Notes (Signed)
1 Day Post-Op  Subjective: Seen in hemodialysis. Condition stable.  Alert. No bleeding from sacral wound.  Objective: Vital signs in last 24 hours: Temp:  [97.3 F (36.3 C)-98.6 F (37 C)] 98.4 F (36.9 C) (11/20 0750) Pulse Rate:  [72-88] 72 (11/20 0930) Resp:  [11-20] 17 (11/20 0805) BP: (96-157)/(45-73) 96/45 (11/20 0930) SpO2:  [97 %-100 %] 99 % (11/20 0750) Weight:  [73.9 kg (162 lb 14.7 oz)-78.5 kg (173 lb 1 oz)] 73.9 kg (162 lb 14.7 oz) (11/20 0750) Last BM Date: 04/07/17  Intake/Output from previous day: 11/19 0701 - 11/20 0700 In: 1227.3 [P.O.:480; I.V.:647.3; IV Piggyback:100] Out: 252 [Blood:252] Intake/Output this shift: No intake/output data recorded.   EXAM: General appearance: alert.  Answers questions.  Poor insight.  No significant distress. Incision/Wound:  Sacral wound dressing clean and dry.  No bleeding.  Lab Results:  Results for orders placed or performed during the hospital encounter of 04/01/17 (from the past 24 hour(s))  Type and screen [facility]     Status: None   Collection Time: 04/08/17  2:50 PM  Result Value Ref Range   ABO/RH(D) O POS    Antibody Screen NEG    Sample Expiration 04/11/2017   ABO/Rh     Status: None   Collection Time: 04/08/17  2:50 PM  Result Value Ref Range   ABO/RH(D) O POS   Aerobic/Anaerobic Culture (surgical/deep wound)     Status: None (Preliminary result)   Collection Time: 04/08/17  3:19 PM  Result Value Ref Range   Specimen Description WOUND    Special Requests LEFT FOOT GANGRENE    Gram Stain NO WBC SEEN RARE GRAM POSITIVE COCCI IN PAIRS     Culture PENDING    Report Status PENDING   Glucose, capillary     Status: None   Collection Time: 04/08/17  3:50 PM  Result Value Ref Range   Glucose-Capillary 72 65 - 99 mg/dL  Renal function panel     Status: Abnormal   Collection Time: 04/08/17  6:44 PM  Result Value Ref Range   Sodium 136 135 - 145 mmol/L   Potassium 3.9 3.5 - 5.1 mmol/L    Chloride 99 (L) 101 - 111 mmol/L   CO2 26 22 - 32 mmol/L   Glucose, Bld 95 65 - 99 mg/dL   BUN 15 6 - 20 mg/dL   Creatinine, Ser 1.615.33 (H) 0.61 - 1.24 mg/dL   Calcium 8.6 (L) 8.9 - 10.3 mg/dL   Phosphorus 5.2 (H) 2.5 - 4.6 mg/dL   Albumin 1.9 (L) 3.5 - 5.0 g/dL   GFR calc non Af Amer 9 (L) >60 mL/min   GFR calc Af Amer 11 (L) >60 mL/min   Anion gap 11 5 - 15  CBC     Status: Abnormal   Collection Time: 04/08/17  6:44 PM  Result Value Ref Range   WBC 11.2 (H) 4.0 - 10.5 K/uL   RBC 3.37 (L) 4.22 - 5.81 MIL/uL   Hemoglobin 9.3 (L) 13.0 - 17.0 g/dL   HCT 09.630.6 (L) 04.539.0 - 40.952.0 %   MCV 90.8 78.0 - 100.0 fL   MCH 27.6 26.0 - 34.0 pg   MCHC 30.4 30.0 - 36.0 g/dL   RDW 81.116.7 (H) 91.411.5 - 78.215.5 %   Platelets 260 150 - 400 K/uL  CBC     Status: Abnormal   Collection Time: 04/09/17  8:00 AM  Result Value Ref Range   WBC 10.8 (H) 4.0 -  10.5 K/uL   RBC 3.14 (L) 4.22 - 5.81 MIL/uL   Hemoglobin 8.6 (L) 13.0 - 17.0 g/dL   HCT 09.828.2 (L) 11.939.0 - 14.752.0 %   MCV 89.8 78.0 - 100.0 fL   MCH 27.4 26.0 - 34.0 pg   MCHC 30.5 30.0 - 36.0 g/dL   RDW 82.916.5 (H) 56.211.5 - 13.015.5 %   Platelets 288 150 - 400 K/uL  Renal function panel     Status: Abnormal   Collection Time: 04/09/17  8:00 AM  Result Value Ref Range   Sodium 137 135 - 145 mmol/L   Potassium 4.0 3.5 - 5.1 mmol/L   Chloride 101 101 - 111 mmol/L   CO2 25 22 - 32 mmol/L   Glucose, Bld 79 65 - 99 mg/dL   BUN 22 (H) 6 - 20 mg/dL   Creatinine, Ser 8.656.43 (H) 0.61 - 1.24 mg/dL   Calcium 8.7 (L) 8.9 - 10.3 mg/dL   Phosphorus 6.3 (H) 2.5 - 4.6 mg/dL   Albumin 1.8 (L) 3.5 - 5.0 g/dL   GFR calc non Af Amer 7 (L) >60 mL/min   GFR calc Af Amer 8 (L) >60 mL/min   Anion gap 11 5 - 15     Studies/Results: No results found.  . feeding supplement (ENSURE ENLIVE)  237 mL Oral BID BM  . feeding supplement (PRO-STAT SUGAR FREE 64)  30 mL Oral TID  . heparin  5,000 Units Subcutaneous Q8H  . multivitamin  1 tablet Oral QHS  . sevelamer carbonate  800 mg Oral  TID WC     Assessment/Plan: s/p Procedure(s): AMPUTATION ABOVE KNEE LEFT DEBRIDMENT OF Sacral Wound  POD #1.  Debridement large sacral decubitus ulcer.  Stable wound without bleeding. -Will involve WOC to optimize wound care -Will involve physical therapy to see if hydrotherapy can help -Diverting colostomy was offered to family last week and again yesterday and in both cases they declined.  They understand this will complicate wound care  End-stage renal disease on dialysis Dementia  Hypertension Diabetes Atherosclerotic peripheral vascular disease Coronary artery disease, history CABG History laparoscopic colon resection, details unknown  @PROBHOSP @  LOS: 7 days    Mikey BussingHaywood M Elnathan Fulford 04/09/2017  . .prob

## 2017-04-10 ENCOUNTER — Telehealth: Payer: Self-pay | Admitting: Vascular Surgery

## 2017-04-10 MED ORDER — HEPARIN SODIUM (PORCINE) 5000 UNIT/ML IJ SOLN
5000.0000 [IU] | Freq: Three times a day (TID) | INTRAMUSCULAR | Status: DC
  Administered 2017-04-11: 5000 [IU] via SUBCUTANEOUS
  Filled 2017-04-10: qty 1

## 2017-04-10 MED ORDER — BOOST / RESOURCE BREEZE PO LIQD
1.0000 | Freq: Two times a day (BID) | ORAL | Status: DC
  Administered 2017-04-10 – 2017-04-11 (×2): 1 via ORAL
  Filled 2017-04-10 (×5): qty 1
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
## 2017-04-10 NOTE — Progress Notes (Addendum)
Physical Therapy Wound Treatment Patient Details  Name: ABDULMALIK DARCO MRN: [id number] Date of Birth: 04/29/1936  Today's Date: 04/10/2017 Time: 3546-5681 Time Calculation (min): 42 min  Subjective  Patient and Family Stated Goals:  Hope to get it healed up.  Pain Score: pt moaned infrequently 4/10 on pain meds.   Wound Assessment  Pressure Injury 04/02/17 Stage IV - Full thickness tissue loss with exposed bone, tendon or muscle. stage 4 after surgery when assessed on 11/20 (Active)  Dressing Type Barrier Film (skin prep);Gauze (Comment);ABD 04/10/2017  2:43 PM  Dressing Changed 04/10/2017  2:43 PM  Dressing Change Frequency PRN 04/10/2017  2:43 PM  State of Healing Eschar 04/10/2017  2:43 PM  Site / Wound Assessment Yellow;Brown;Pink 04/10/2017  2:43 PM  % Wound base Red or Granulating 20% 04/10/2017  2:43 PM  % Wound base Yellow/Fibrinous Exudate 70% 04/10/2017  2:43 PM  % Wound base Black/Eschar 5% 04/10/2017  2:43 PM  % Wound base Other/Granulation Tissue (Comment) 5% 04/10/2017  2:43 PM  Peri-wound Assessment Intact 04/10/2017  2:43 PM  Wound Length (cm) 13 cm 04/10/2017  2:43 PM  Wound Width (cm) 12 cm 04/10/2017  2:43 PM  Wound Depth (cm) 1.5 cm 04/10/2017  2:43 PM  Wound Surface Area (cm^2) 154 cm^2 04/10/2017  2:43 PM  Wound Volume (cm^3) 231 cm^3 04/10/2017  2:43 PM  Margins Unattached edges (unapproximated) 04/10/2017  2:43 PM  Drainage Amount Minimal 04/10/2017  2:43 PM  Drainage Description Serous 04/10/2017  2:43 PM  Treatment Cleansed;Debridement (Selective);Hydrotherapy (Pulse lavage);Packing (Saline gauze) 04/10/2017  2:43 PM   Santyl applied to wound bed prior to applying dressing.    Hydrotherapy Pulsed lavage therapy - wound location: sacrum Pulsed Lavage with Suction (psi): 8 psi Pulsed Lavage with Suction - Normal Saline Used: 1000 mL Pulsed Lavage Tip: Tip with splash shield Selective Debridement Selective Debridement - Location: sacrum Selective  Debridement - Tools Used: Forceps;Scalpel;Scissors Selective Debridement - Tissue Removed: yellow and brown eschar/slough and other non-viable tissues   Wound Assessment and Plan  Wound Therapy - Assess/Plan/Recommendations Wound Therapy - Clinical Statement: Pt can benefit from PLS mostly for decreasing bacterial load, but also for cleansing and sloughing up tissues in prep for selective debridement Wound Therapy - Functional Problem List: pt has had 2 recent AKA's and is relatively immobile at time time.  Starting PT evaluation today. Factors Delaying/Impairing Wound Healing: Diabetes Mellitus;Vascular compromise;Immobility Hydrotherapy Plan: Debridement;Dressing change;Patient/family education;Pulsatile lavage with suction Wound Therapy - Frequency: 6X / week Wound Therapy - Current Recommendations: PT Wound Therapy - Follow Up Recommendations: Skilled nursing facility Wound Plan: see above  Wound Therapy Goals- Improve the function of patient's integumentary system by progressing the wound(s) through the phases of wound healing (inflammation - proliferation - remodeling) by: Decrease Necrotic Tissue to: 30% Decrease Necrotic Tissue - Progress: Goal set today Increase Granulation Tissue to: 70 % including viable CT Increase Granulation Tissue - Progress: Goal set today Goals/treatment plan/discharge plan were made with and agreed upon by patient/family: Yes Time For Goal Achievement: 7 days Wound Therapy - Potential for Goals: Good  Goals will be updated until maximal potential achieved or discharge criteria met.  Discharge criteria: when goals achieved, discharge from hospital, MD decision/surgical intervention, no progress towards goals, refusal/missing three consecutive treatments without notification or medical reason.  GP     Tessie Fass Reynalda Canny 04/10/2017, 4:26 PM 04/10/2017  Donnella Sham, [place] [phone]  (pager)

## 2017-04-10 NOTE — Progress Notes (Signed)
Snuff tobacco found in bed with patient, pt refuses to give to nurse to store away.

## 2017-04-10 NOTE — Telephone Encounter (Signed)
Sched appt 05/10/17 at 9:15. Spoke to pt's wife.

## 2017-04-10 NOTE — Progress Notes (Signed)
PRN oxycodone and morphine given as ordered (see EMAR)  for sacral decub pain, pt refuses to be turned and repositioned in bed, prefers to lay with HOB 25-20 degrees flat, will continue to monitor.

## 2017-04-10 NOTE — Care Management Important Message (Signed)
Important Message  Patient Details  Name: [name] MRN: [id number] Date of Birth: 09/24/1935   Medicare Important Message Given:  Yes    Kyla BalzarineShealy, Romeka Scifres Abena 04/10/2017, 10:55 AM

## 2017-04-10 NOTE — Progress Notes (Signed)
Nutrition Follow-up  DOCUMENTATION CODES:   Obesity unspecified  INTERVENTION:    Discontinue Ensure Enlive BID   Provide Boost Breeze po BID, each supplement provides 250 kcal and 9 grams of protein   Continue Pro-Stat TID, each supplement provides 100 kcal and 15 grams protein   NUTRITION DIAGNOSIS:   Increased nutrient needs related to chronic illness, wound healing as evidenced by estimated needs.  Ongoing   GOAL:   Patient will meet greater than or equal to 90% of their needs  Progressing   MONITOR:   PO intake, Supplement acceptance, Labs, Weight trends, Skin, I & O's    ASSESSMENT:   81 yo Male with ESRD, hypertension, right AKA, presented with necrosis of toes on the left feet.  Patient also had been missing dialysis for 1 week, was sent by HD center due to worsening necrosis.   Pt s/p L AKA and debridement on 04/08/17 Pt s/p HD yesterday 900 mL removed. Discussed pt with RN.  RN reports pt is consuming about ~50% of meals at this time. RN reports pt is consuming the Pro-Stats but refusing Ensure supplements, RD to order Boost Breeze for pt to try. Spoke with pt, reports current pain, endorses a fair appetite and  intake of >/= 50%  Labs reviewed; Phosphorus 6.3 (H), Albumin 1.8 (L), Hemoglobin 8.6 (L) Medications reviewed; Rena-vit, Renvela  Diet Order:  Diet renal with fluid restriction Fluid restriction: 1200 mL Fluid; Room service appropriate? Yes; Fluid consistency: Thin  EDUCATION NEEDS:   No education needs have been identified at this time  Skin:  Skin Assessment: Skin Integrity Issues: Skin Integrity Issues:: Stage II, Unstageable, Stage III, Other (Comment) Stage II: buttock Stage III: coccyx Unstageable: sacrum Other: gangernous toes for L foot  Last BM:  04/09/17  Height:   Ht Readings from Last 1 Encounters:  04/08/17 5\' 10"  (1.778 m)    Weight:   Wt Readings from Last 1 Encounters:  04/09/17 161 lb 2.5 oz (73.1 kg)   BMI:   30.4 adjusted for L and R AKA  Estimated Nutritional Needs:   Kcal:  2250-2450  Protein:  122-162 gm  Fluid:  1200 ml  Fransisca KaufmannAllison Ioannides, MS, RDN, LDN 04/10/2017 12:46 PM

## 2017-04-10 NOTE — Progress Notes (Signed)
Pharmacy Antibiotic Note  Ian BlakeWarren J Turner is a 81 y.o. male admitted on 04/01/2017 with wound infection.Marland Kitchen.  Pharmacy has been consulted for Vancomycin and Zosyn dosing.  The patient is ESRD-MWF however is off-schedule this week due to the holidays. The patient received HD on 11/20 then plan back to MWF on 11/23.  POD#2 L AKA and sacral wound debridement. Operative left foot wound culture reincubated.  Plan:  Continue Vancomycin 1 gm IV after each HD, MWF, next due 11/23.  Continue Zosyn 2.275 gm IV q12h (each over 4 hours).  Follow up operative left foot wound culture and antibiotic plans s/p L AKA.  Will consider checking Vanc level pre-dialysis on 11/23 if Vanc to continue.  Height: 5\' 10"  (177.8 cm) Weight: 161 lb 2.5 oz (73.1 kg) IBW/kg (Calculated) : 73  Temp (24hrs), Avg:98.3 F (36.8 C), Min:98 F (36.7 C), Max:98.6 F (37 C)  Recent Labs  Lab 04/04/17 0542 04/05/17 0516 04/07/17 0709 04/08/17 1844 04/09/17 0800  WBC 12.2* 11.6* 11.9* 11.2* 10.8*  CREATININE 5.80* 7.75* 6.96* 5.33* 6.43*     Antimicrobials this admission: Vanc 11/12>> Zosyn 11/12>>  Dose adjustments this admission:  none  Microbiology results: 11/12 @ 2145 blood - negative 11/13 @ 0150 blood - negative 11/13 MRSA PCR - positive - CHG/Bactroban 11/13>>11/17 11/15 MRSA PCR - negative 11/19 MRSA PCR - negative 11/19 L foot wound - rare GPC in pairs, reincubated - L AKA done  Thank you for allowing pharmacy to be a part of this patient's care.  Dennie Fettersgan, Kamaya Keckler [place], [place] Pager: [phone] 04/10/2017 10:56 AM

## 2017-04-10 NOTE — Progress Notes (Signed)
PT Cancellation Note  Patient Details Name: [name] MRN: [id number] DOB: 07/11/1935   Cancelled Treatment:    Reason Eval/Treat Not Completed: Patient at procedure or test/unavailable. Pt currently receiving hydrotherapy for sacral wound. PT will continue to f/u with pt as available.   Alessandra BevelsJennifer M Didi Ganaway 04/10/2017, 1:45 PM

## 2017-04-10 NOTE — Progress Notes (Signed)
Subjective  - POD #2  C/o pain in stump but better than pre-op Asking when can he get a new leg  Physical Exam:  Dressing changed  Stump loos good       Assessment/Plan:  POD #2  Continue with daily dressing changes with kerlix Routine post-op care  Wells Sury Wentworth 04/10/2017 9:23 AM --  Vitals:   04/09/17 2056 04/10/17 0627  BP: (!) 136/34 (!) 128/48  Pulse: 73 76  Resp: 19 20  Temp: 98.6 F (37 C) 98.1 F (36.7 C)  SpO2: 100% 100%    Intake/Output Summary (Last 24 hours) at 04/10/2017 0923 Last data filed at 04/10/2017 [id number] Gross per 24 hour  Intake 539.83 ml  Output 838 ml  Net -298.17 ml     Laboratory CBC    Component Value Date/Time   WBC 10.8 (H) 04/09/2017 0800   HGB 8.6 (L) 04/09/2017 0800   HCT 28.2 (L) 04/09/2017 0800   PLT 288 04/09/2017 0800    BMET    Component Value Date/Time   NA 137 04/09/2017 0800   K 4.0 04/09/2017 0800   CL 101 04/09/2017 0800   CO2 25 04/09/2017 0800   GLUCOSE 79 04/09/2017 0800   BUN 22 (H) 04/09/2017 0800   CREATININE 6.43 (H) 04/09/2017 0800   CALCIUM 8.7 (L) 04/09/2017 0800   CALCIUM 8.4 11/29/2007 1515   GFRNONAA 7 (L) 04/09/2017 0800   GFRAA 8 (L) 04/09/2017 0800    COAG Lab Results  Component Value Date   INR 1.30 04/01/2017   INR 1.10 01/01/2017   INR 1.3 12/04/2008   No results found for: PTT  Antibiotics Anti-infectives (From admission, onward)   Start     Dose/Rate Route Frequency Ordered Stop   04/12/17 1200  vancomycin (VANCOCIN) IVPB 1000 mg/200 mL premix     1,000 mg 200 mL/hr over 60 Minutes Intravenous Every M-W-F (Hemodialysis) 04/07/17 1403     04/09/17 1200  vancomycin (VANCOCIN) IVPB 1000 mg/200 mL premix     1,000 mg 200 mL/hr over 60 Minutes Intravenous Every Tue (Hemodialysis) 04/07/17 1402 04/09/17 1150   04/07/17 0716  vancomycin (VANCOCIN) 1-5 GM/200ML-% IVPB    Comments:  Carlyon ProwsZhao, Xiaobo   : cabinet override      04/07/17 0716 04/07/17 0911   04/03/17 1200   vancomycin (VANCOCIN) IVPB 1000 mg/200 mL premix  Status:  Discontinued     1,000 mg 200 mL/hr over 60 Minutes Intravenous Every M-W-F (Hemodialysis) 04/02/17 1328 04/07/17 1403   04/03/17 1002  vancomycin (VANCOCIN) 1-5 GM/200ML-% IVPB    Comments:  Carlyon ProwsZhao, Xiaobo   : cabinet override      04/03/17 1002 04/03/17 1135   04/02/17 1400  piperacillin-tazobactam (ZOSYN) IVPB 3.375 g     3.375 g 12.5 mL/hr over 240 Minutes Intravenous Every 12 hours 04/02/17 1328     04/02/17 1200  vancomycin (VANCOCIN) IVPB 1000 mg/200 mL premix  Status:  Discontinued     1,000 mg 200 mL/hr over 60 Minutes Intravenous Every T-Th-Sa (Hemodialysis) 04/02/17 0314 04/02/17 1328   04/02/17 0315  vancomycin (VANCOCIN) 2,000 mg in sodium chloride 0.9 % 500 mL IVPB     2,000 mg 250 mL/hr over 120 Minutes Intravenous  Once 04/02/17 0314 04/02/17 0612   04/01/17 2300  piperacillin-tazobactam (ZOSYN) IVPB 3.375 g     3.375 g 100 mL/hr over 30 Minutes Intravenous  Once 04/01/17 2246 04/02/17 0212       V. Charlena CrossWells Garnet Chatmon IV, M.D. [facility] [place] Office: [phone] Pager:  [phone]

## 2017-04-10 NOTE — Progress Notes (Signed)
[facility] Progress Note   Subjective: More awake today. Dr. Trula Slade at bedside changing L AKA stump drsg. Pt says he doesn't feel the same but no C/Os of pain. Helping to set up for breakfast. Appears back to baseline mentally. HD yest- removed 900 - tolerated well   Objective Vitals:   04/09/17 1215 04/09/17 1700 04/09/17 2056 04/10/17 0627  BP: (!) 112/47 (!) 108/48 (!) 136/34 (!) 128/48  Pulse: 71 74 73 76  Resp: '13 16 19 20  '$ Temp: 98.2 F (36.8 C) 98 F (36.7 C) 98.6 F (37 C) 98.1 F (36.7 C)  TempSrc: Oral Oral Oral Oral  SpO2: 99% 98% 100% 100%  Weight: 73 kg (160 lb 15 oz)  73.1 kg (161 lb 2.5 oz)   Height:       Physical Exam General: Chronically ill elderly male in NAD Heart: S1,S2, RRR Lungs: CTAB A/P Abdomen: Active BS Extremities: Bilateral AKA Drsg CDI intact L AKA.  Dialysis Access: LFA AVG + bruit   Additional Objective Labs: Basic Metabolic Panel: Recent Labs  Lab 04/07/17 0709 04/08/17 1844 04/09/17 0800  NA 135 136 137  K 3.6 3.9 4.0  CL 98* 99* 101  CO2 '26 26 25  '$ GLUCOSE 78 95 79  BUN 38* 15 22*  CREATININE 6.96* 5.33* 6.43*  CALCIUM 8.3* 8.6* 8.7*  PHOS 5.6* 5.2* 6.3*   Liver Function Tests: Recent Labs  Lab 04/07/17 0709 04/08/17 1844 04/09/17 0800  ALBUMIN 1.9* 1.9* 1.8*   No results for input(s): LIPASE, AMYLASE in the last 168 hours. CBC: Recent Labs  Lab 04/04/17 0542 04/05/17 0516 04/07/17 0709 04/08/17 1844 04/09/17 0800  WBC 12.2* 11.6* 11.9* 11.2* 10.8*  HGB 9.4* 8.9* 8.7* 9.3* 8.6*  HCT 30.5* 28.7* 27.5* 30.6* 28.2*  MCV 90.0 88.0 88.1 90.8 89.8  PLT 279 286 280 260 288   Blood Culture    Component Value Date/Time   SDES WOUND 04/08/2017 1519   SPECREQUEST LEFT FOOT GANGRENE 04/08/2017 1519   CULT CULTURE REINCUBATED FOR BETTER GROWTH 04/08/2017 1519   REPTSTATUS PENDING 04/08/2017 1519    Cardiac Enzymes: No results for input(s): CKTOTAL, CKMB, CKMBINDEX, TROPONINI in the last 168  hours. CBG: Recent Labs  Lab 04/08/17 1550  GLUCAP 72   Iron Studies: No results for input(s): IRON, TIBC, TRANSFERRIN, FERRITIN in the last 72 hours. '@lablastinr3'$ @ Studies/Results: No results found. Medications: . sodium chloride 10 mL/hr at 04/08/17 1303  . piperacillin-tazobactam (ZOSYN)  IV Stopped (04/10/17 0214)  . [START ON 04/12/2017] vancomycin     . collagenase   Topical Daily  . feeding supplement (ENSURE ENLIVE)  237 mL Oral BID BM  . feeding supplement (PRO-STAT SUGAR FREE 64)  30 mL Oral TID  . heparin  5,000 Units Subcutaneous Q8H  . multivitamin  1 tablet Oral QHS  . sevelamer carbonate  800 mg Oral TID WC     Dialysis Orders:MWF [facility] 4h 58mn 86kg Hep 8500 2/2 bath L AVG Parsibiv 7.5 mcg IV TIW (not on hospital formulary) Mircera 100 mcg IV Q 2 weeks (last dose 03/20/17 Last HGB 10.3 03/20/17) Calcitriol 0.25 mcg PO TIW  BMD meds: No binders on OP med list  Assessment: 1. Gangrene L Foot/s/p  L AKA per Dr. CDonzetta Matters11/19/18. VVS following.  2. Sacral decubitus: General surgery consulted. S/P surgical debridement 04/08/17 Dr. IDalbert Batman No diverting ostomy. WOC following. Concerned that he may not be able to sit in HD chair. May need [facility] consult.  3. ESRD -  HDMWF via LFA AVG. Next HD on 04/12/17 (Friday).  4. Volume - BP controlled and stable.  HD 11/20 Pre wt 73.9 kg Net UF 838 Post wt 73. Still doubt accuracy of wt but this wt is more realistic. Will need EDW lowered on DC.  5. Anemia - HGB 8.6 after surg 11/18. Aranesp 60 mcg IV 04/07/17. Follow HGB.  6. Metabolic bone disease - Ca OK Hold VDRA, unable to give parsibiv while in patient. Phos 6.3 Renvela 800 mg PO TID AC when able to eat. Not the biggest issue 7. Nutrition - Albumin 1.9. Renal diet. Severe PCM probably D/T infection, poor intake.  Prostat and renal vit. 8. Dementia: Psych noted not competent to make decisions. Family has met with pall care,  Is DNR but wanting to move forward with  AKA and debridement of sacral ulcer     [name] NP-C 04/10/2017, 9:12 AM  [facility] [phone]  Patient seen and examined, agree with above note with above modifications. He is somnolent but aroused and asked for pain med.  Next HD Friday- we can try to do him in recliner to see if can tolerate- other wise agree that [facility] may be appropriate  Corliss Parish, MD 04/10/2017

## 2017-04-10 NOTE — Telephone Encounter (Signed)
-----   Message from Sharee PimpleMarilyn K McChesney, RN sent at 04/09/2017 10:23 AM EST ----- Regarding: 4 weeks postop AKA staples out   ----- Message ----- From: Dara Lordshyne, Samantha J, PA-C Sent: 04/09/2017   9:59 AM To: Vvs Charge Pool  S/p left AKA.  F/u with Dr. Randie Heinzain in 4 weeks.  Thanks

## 2017-04-10 NOTE — Progress Notes (Signed)
PRN morphine 2mg  IVP (see EMAR) given as pre-medication for sacral wound hydrotherapy, will continue to monitor.

## 2017-04-10 NOTE — Progress Notes (Signed)
Surgery:  Stable and alert. Eating his lunch Family members in room and we had a long conversation  Sacral wound dressing clean and dry.  No bleeding Await PT hydrotherapy to examine the wound WOC  involved as well   Assess/plan: POD #2.  Debridement large sacral decubitus ulcer.  Stable wound without bleeding.help -Diverting colostomy was offered to family last week and again on 11/19 and in both cases they declined.  They understand this will complicate wound care -PT hydrotherapy -santyl -air mattress -wet to dry dressings.  S/p bilateral AKA End-stage renal disease on dialysis Dementia prior ETOH abuse Hypertension Diabetes Atherosclerotic peripheral vascular disease Coronary artery disease, history CABG History laparoscopic colon resection, details unknown   [name] 2:51 PM

## 2017-04-10 NOTE — Progress Notes (Signed)
2 Days Post-Op    CC:  Decubitus   Subjective: We missed the dressing change, we will look at it tomorrow with Hydrotherapy.  Objective: Vital signs in last 24 hours: Temp:  [98 F (36.7 C)-98.6 F (37 C)] 98.4 F (36.9 C) (11/21 0800) Pulse Rate:  [68-76] 68 (11/21 0800) Resp:  [16-20] 18 (11/21 0800) BP: (108-136)/(34-48) 115/43 (11/21 0800) SpO2:  [98 %-100 %] 100 % (11/21 0800) Weight:  [73.1 kg (161 lb 2.5 oz)] 73.1 kg (161 lb 2.5 oz) (11/20 2056) Last BM Date: 04/09/17  Intake/Output from previous day: 11/20 0701 - 11/21 0700 In: 539.8 [P.O.:240; I.V.:199.8; IV Piggyback:100] Out: 838  Intake/Output this shift: No intake/output data recorded.  We did not examine pt today  Lab Results:  Recent Labs    04/08/17 1844 04/09/17 0800  WBC 11.2* 10.8*  HGB 9.3* 8.6*  HCT 30.6* 28.2*  PLT 260 288    BMET Recent Labs    04/08/17 1844 04/09/17 0800  NA 136 137  K 3.9 4.0  CL 99* 101  CO2 26 25  GLUCOSE 95 79  BUN 15 22*  CREATININE 5.33* 6.43*  CALCIUM 8.6* 8.7*   PT/INR No results for input(s): LABPROT, INR in the last 72 hours.  Recent Labs  Lab 04/07/17 0709 04/08/17 1844 04/09/17 0800  ALBUMIN 1.9* 1.9* 1.8*     Lipase  No results found for: LIPASE   Medications: . collagenase   Topical Daily  . feeding supplement  1 Container Oral BID BM  . feeding supplement (PRO-STAT SUGAR FREE 64)  30 mL Oral TID  . heparin  5,000 Units Subcutaneous Q8H  . multivitamin  1 tablet Oral QHS  . sevelamer carbonate  800 mg Oral TID WC   Anti-infectives (From admission, onward)   Start     Dose/Rate Route Frequency Ordered Stop   04/12/17 1200  vancomycin (VANCOCIN) IVPB 1000 mg/200 mL premix     1,000 mg 200 mL/hr over 60 Minutes Intravenous Every M-W-F (Hemodialysis) 04/07/17 1403     04/09/17 1200  vancomycin (VANCOCIN) IVPB 1000 mg/200 mL premix     1,000 mg 200 mL/hr over 60 Minutes Intravenous Every Tue (Hemodialysis) 04/07/17 1402 04/09/17 1150    04/07/17 0716  vancomycin (VANCOCIN) 1-5 GM/200ML-% IVPB    Comments:  [name], [name]   : cabinet override      04/07/17 0716 04/07/17 0911   04/03/17 1200  vancomycin (VANCOCIN) IVPB 1000 mg/200 mL premix  Status:  Discontinued     1,000 mg 200 mL/hr over 60 Minutes Intravenous Every M-W-F (Hemodialysis) 04/02/17 1328 04/07/17 1403   04/03/17 1002  vancomycin (VANCOCIN) 1-5 GM/200ML-% IVPB    Comments:  [name], [name]   : cabinet override      04/03/17 1002 04/03/17 1135   04/02/17 1400  piperacillin-tazobactam (ZOSYN) IVPB 3.375 g     3.375 g 12.5 mL/hr over 240 Minutes Intravenous Every 12 hours 04/02/17 1328     04/02/17 1200  vancomycin (VANCOCIN) IVPB 1000 mg/200 mL premix  Status:  Discontinued     1,000 mg 200 mL/hr over 60 Minutes Intravenous Every T-Th-Sa (Hemodialysis) 04/02/17 0314 04/02/17 1328   04/02/17 0315  vancomycin (VANCOCIN) 2,000 mg in sodium chloride 0.9 % 500 mL IVPB     2,000 mg 250 mL/hr over 120 Minutes Intravenous  Once 04/02/17 0314 04/02/17 0612   04/01/17 2300  piperacillin-tazobactam (ZOSYN) IVPB 3.375 g     3.375 g 100 mL/hr over 30 Minutes Intravenous  Once 04/01/17 2246 04/02/17 0212      Assessment/Plan s/p Procedure(s): AMPUTATION ABOVE KNEE LEFT DEBRIDMENT OF Sacral Wound  POD #1.  Debridement large sacral decubitus ulcer.  Stable wound without bleeding. -Will involve WOC to optimize wound care -Will involve physical therapy to see if hydrotherapy can help -Diverting colostomy was offered to family last week and again yesterday and in both cases they declined.  They understand this will complicate wound care [facility] did not call. Will look at wound tomorrow.  End-stage renal disease on dialysis Dementia  Hypertension Diabetes Atherosclerotic peripheral vascular disease Coronary artery disease, history CABG History laparoscopic colon resection, details unknown  FEN:  Renal diet ID:  Vancomycin/ Zosyn =>> day 9 DVT: Heparin Follow  up:  Wound care clinic  Plan:  We will try to see wound tomorrow.     LOS: 8 days    [name] 04/10/2017 [phone]

## 2017-04-11 DIAGNOSIS — E8889 Other specified metabolic disorders: Secondary | ICD-10-CM | POA: Diagnosis not present

## 2017-04-11 DIAGNOSIS — E1122 Type 2 diabetes mellitus with diabetic chronic kidney disease: Secondary | ICD-10-CM | POA: Diagnosis not present

## 2017-04-11 DIAGNOSIS — R488 Other symbolic dysfunctions: Secondary | ICD-10-CM | POA: Diagnosis not present

## 2017-04-11 DIAGNOSIS — X58XXXA Exposure to other specified factors, initial encounter: Secondary | ICD-10-CM | POA: Diagnosis not present

## 2017-04-11 DIAGNOSIS — M6281 Muscle weakness (generalized): Secondary | ICD-10-CM | POA: Diagnosis not present

## 2017-04-11 DIAGNOSIS — R2689 Other abnormalities of gait and mobility: Secondary | ICD-10-CM | POA: Diagnosis not present

## 2017-04-11 DIAGNOSIS — E119 Type 2 diabetes mellitus without complications: Secondary | ICD-10-CM | POA: Diagnosis not present

## 2017-04-11 DIAGNOSIS — Z4789 Encounter for other orthopedic aftercare: Secondary | ICD-10-CM | POA: Diagnosis not present

## 2017-04-11 DIAGNOSIS — Z4509 Encounter for adjustment and management of other cardiac device: Secondary | ICD-10-CM | POA: Diagnosis not present

## 2017-04-11 DIAGNOSIS — S78119A Complete traumatic amputation at level between unspecified hip and knee, initial encounter: Secondary | ICD-10-CM | POA: Diagnosis not present

## 2017-04-11 DIAGNOSIS — I10 Essential (primary) hypertension: Secondary | ICD-10-CM | POA: Diagnosis not present

## 2017-04-11 DIAGNOSIS — D638 Anemia in other chronic diseases classified elsewhere: Secondary | ICD-10-CM | POA: Diagnosis not present

## 2017-04-11 DIAGNOSIS — T7601XA Adult neglect or abandonment, suspected, initial encounter: Secondary | ICD-10-CM | POA: Diagnosis not present

## 2017-04-11 DIAGNOSIS — E1129 Type 2 diabetes mellitus with other diabetic kidney complication: Secondary | ICD-10-CM | POA: Diagnosis not present

## 2017-04-11 DIAGNOSIS — Z992 Dependence on renal dialysis: Secondary | ICD-10-CM | POA: Diagnosis not present

## 2017-04-11 DIAGNOSIS — R0989 Other specified symptoms and signs involving the circulatory and respiratory systems: Secondary | ICD-10-CM | POA: Diagnosis not present

## 2017-04-11 DIAGNOSIS — E1121 Type 2 diabetes mellitus with diabetic nephropathy: Secondary | ICD-10-CM | POA: Diagnosis not present

## 2017-04-11 DIAGNOSIS — E43 Unspecified severe protein-calorie malnutrition: Secondary | ICD-10-CM | POA: Diagnosis not present

## 2017-04-11 DIAGNOSIS — R262 Difficulty in walking, not elsewhere classified: Secondary | ICD-10-CM | POA: Diagnosis not present

## 2017-04-11 DIAGNOSIS — Z9862 Peripheral vascular angioplasty status: Secondary | ICD-10-CM | POA: Diagnosis not present

## 2017-04-11 DIAGNOSIS — I70209 Unspecified atherosclerosis of native arteries of extremities, unspecified extremity: Secondary | ICD-10-CM | POA: Diagnosis not present

## 2017-04-11 DIAGNOSIS — Z79899 Other long term (current) drug therapy: Secondary | ICD-10-CM | POA: Diagnosis not present

## 2017-04-11 DIAGNOSIS — L89150 Pressure ulcer of sacral region, unstageable: Secondary | ICD-10-CM | POA: Diagnosis not present

## 2017-04-11 DIAGNOSIS — R402413 Glasgow coma scale score 13-15, at hospital admission: Secondary | ICD-10-CM | POA: Diagnosis not present

## 2017-04-11 DIAGNOSIS — G8912 Acute post-thoracotomy pain: Secondary | ICD-10-CM | POA: Diagnosis not present

## 2017-04-11 DIAGNOSIS — I2584 Coronary atherosclerosis due to calcified coronary lesion: Secondary | ICD-10-CM | POA: Diagnosis not present

## 2017-04-11 DIAGNOSIS — Z66 Do not resuscitate: Secondary | ICD-10-CM | POA: Diagnosis not present

## 2017-04-11 DIAGNOSIS — I998 Other disorder of circulatory system: Secondary | ICD-10-CM | POA: Diagnosis not present

## 2017-04-11 DIAGNOSIS — M1990 Unspecified osteoarthritis, unspecified site: Secondary | ICD-10-CM | POA: Diagnosis not present

## 2017-04-11 DIAGNOSIS — I739 Peripheral vascular disease, unspecified: Secondary | ICD-10-CM | POA: Diagnosis not present

## 2017-04-11 DIAGNOSIS — S31000D Unspecified open wound of lower back and pelvis without penetration into retroperitoneum, subsequent encounter: Secondary | ICD-10-CM | POA: Diagnosis not present

## 2017-04-11 DIAGNOSIS — D631 Anemia in chronic kidney disease: Secondary | ICD-10-CM | POA: Diagnosis not present

## 2017-04-11 DIAGNOSIS — I132 Hypertensive heart and chronic kidney disease with heart failure and with stage 5 chronic kidney disease, or end stage renal disease: Secondary | ICD-10-CM | POA: Diagnosis not present

## 2017-04-11 DIAGNOSIS — T82868A Thrombosis of vascular prosthetic devices, implants and grafts, initial encounter: Secondary | ICD-10-CM | POA: Diagnosis not present

## 2017-04-11 DIAGNOSIS — I96 Gangrene, not elsewhere classified: Secondary | ICD-10-CM | POA: Diagnosis not present

## 2017-04-11 DIAGNOSIS — Z89611 Acquired absence of right leg above knee: Secondary | ICD-10-CM | POA: Diagnosis not present

## 2017-04-11 DIAGNOSIS — R1310 Dysphagia, unspecified: Secondary | ICD-10-CM | POA: Diagnosis not present

## 2017-04-11 DIAGNOSIS — E1152 Type 2 diabetes mellitus with diabetic peripheral angiopathy with gangrene: Secondary | ICD-10-CM | POA: Diagnosis not present

## 2017-04-11 DIAGNOSIS — Z89612 Acquired absence of left leg above knee: Secondary | ICD-10-CM | POA: Diagnosis not present

## 2017-04-11 DIAGNOSIS — N179 Acute kidney failure, unspecified: Secondary | ICD-10-CM | POA: Diagnosis not present

## 2017-04-11 DIAGNOSIS — I2510 Atherosclerotic heart disease of native coronary artery without angina pectoris: Secondary | ICD-10-CM | POA: Diagnosis not present

## 2017-04-11 DIAGNOSIS — N2581 Secondary hyperparathyroidism of renal origin: Secondary | ICD-10-CM | POA: Diagnosis not present

## 2017-04-11 DIAGNOSIS — M1A9XX0 Chronic gout, unspecified, without tophus (tophi): Secondary | ICD-10-CM | POA: Diagnosis not present

## 2017-04-11 DIAGNOSIS — L8992 Pressure ulcer of unspecified site, stage 2: Secondary | ICD-10-CM | POA: Diagnosis not present

## 2017-04-11 DIAGNOSIS — I999 Unspecified disorder of circulatory system: Secondary | ICD-10-CM | POA: Diagnosis not present

## 2017-04-11 DIAGNOSIS — G894 Chronic pain syndrome: Secondary | ICD-10-CM | POA: Diagnosis not present

## 2017-04-11 DIAGNOSIS — M898X9 Other specified disorders of bone, unspecified site: Secondary | ICD-10-CM | POA: Diagnosis not present

## 2017-04-11 DIAGNOSIS — L89154 Pressure ulcer of sacral region, stage 4: Secondary | ICD-10-CM | POA: Diagnosis not present

## 2017-04-11 DIAGNOSIS — N186 End stage renal disease: Secondary | ICD-10-CM | POA: Diagnosis not present

## 2017-04-11 DIAGNOSIS — E785 Hyperlipidemia, unspecified: Secondary | ICD-10-CM | POA: Diagnosis not present

## 2017-04-11 DIAGNOSIS — T82858A Stenosis of vascular prosthetic devices, implants and grafts, initial encounter: Secondary | ICD-10-CM | POA: Diagnosis not present

## 2017-04-11 DIAGNOSIS — E8809 Other disorders of plasma-protein metabolism, not elsewhere classified: Secondary | ICD-10-CM | POA: Diagnosis not present

## 2017-04-11 DIAGNOSIS — R4189 Other symptoms and signs involving cognitive functions and awareness: Secondary | ICD-10-CM | POA: Diagnosis not present

## 2017-04-11 DIAGNOSIS — L03116 Cellulitis of left lower limb: Secondary | ICD-10-CM | POA: Diagnosis not present

## 2017-04-11 DIAGNOSIS — I120 Hypertensive chronic kidney disease with stage 5 chronic kidney disease or end stage renal disease: Secondary | ICD-10-CM | POA: Diagnosis not present

## 2017-04-11 DIAGNOSIS — Y832 Surgical operation with anastomosis, bypass or graft as the cause of abnormal reaction of the patient, or of later complication, without mention of misadventure at the time of the procedure: Secondary | ICD-10-CM | POA: Diagnosis not present

## 2017-04-11 DIAGNOSIS — I744 Embolism and thrombosis of arteries of extremities, unspecified: Secondary | ICD-10-CM | POA: Diagnosis not present

## 2017-04-11 DIAGNOSIS — G8922 Chronic post-thoracotomy pain: Secondary | ICD-10-CM | POA: Diagnosis not present

## 2017-04-11 DIAGNOSIS — I509 Heart failure, unspecified: Secondary | ICD-10-CM | POA: Diagnosis not present

## 2017-04-11 DIAGNOSIS — Z515 Encounter for palliative care: Secondary | ICD-10-CM | POA: Diagnosis not present

## 2017-04-11 DIAGNOSIS — G9341 Metabolic encephalopathy: Secondary | ICD-10-CM | POA: Diagnosis not present

## 2017-04-11 DIAGNOSIS — L89159 Pressure ulcer of sacral region, unspecified stage: Secondary | ICD-10-CM | POA: Diagnosis not present

## 2017-04-11 DIAGNOSIS — R5381 Other malaise: Secondary | ICD-10-CM | POA: Diagnosis not present

## 2017-04-11 DIAGNOSIS — I5040 Unspecified combined systolic (congestive) and diastolic (congestive) heart failure: Secondary | ICD-10-CM | POA: Diagnosis not present

## 2017-04-11 DIAGNOSIS — F0390 Unspecified dementia without behavioral disturbance: Secondary | ICD-10-CM | POA: Diagnosis not present

## 2017-04-11 LAB — CBC WITH DIFFERENTIAL/PLATELET
Basophils Absolute: 0 10*3/uL (ref 0.0–0.1)
Basophils Relative: 0 %
EOS PCT: 2 %
Eosinophils Absolute: 0.1 10*3/uL (ref 0.0–0.7)
HCT: 26.8 % — ABNORMAL LOW (ref 39.0–52.0)
Hemoglobin: 8.2 g/dL — ABNORMAL LOW (ref 13.0–17.0)
LYMPHS ABS: 1.4 10*3/uL (ref 0.7–4.0)
LYMPHS PCT: 18 %
MCH: 27.5 pg (ref 26.0–34.0)
MCHC: 30.6 g/dL (ref 30.0–36.0)
MCV: 89.9 fL (ref 78.0–100.0)
MONOS PCT: 5 %
Monocytes Absolute: 0.4 10*3/uL (ref 0.1–1.0)
Neutro Abs: 5.9 10*3/uL (ref 1.7–7.7)
Neutrophils Relative %: 75 %
PLATELETS: 272 10*3/uL (ref 150–400)
RBC: 2.98 MIL/uL — AB (ref 4.22–5.81)
RDW: 16.6 % — ABNORMAL HIGH (ref 11.5–15.5)
WBC: 7.7 10*3/uL (ref 4.0–10.5)

## 2017-04-11 LAB — RENAL FUNCTION PANEL
ANION GAP: 10 (ref 5–15)
Albumin: 1.8 g/dL — ABNORMAL LOW (ref 3.5–5.0)
BUN: 25 mg/dL — ABNORMAL HIGH (ref 6–20)
CHLORIDE: 97 mmol/L — AB (ref 101–111)
CO2: 28 mmol/L (ref 22–32)
CREATININE: 5.86 mg/dL — AB (ref 0.61–1.24)
Calcium: 8.4 mg/dL — ABNORMAL LOW (ref 8.9–10.3)
GFR calc non Af Amer: 8 mL/min — ABNORMAL LOW (ref >60)
GFR, EST AFRICAN AMERICAN: 9 mL/min — AB (ref >60)
GLUCOSE: 107 mg/dL — AB (ref 65–99)
Phosphorus: 4.9 mg/dL — ABNORMAL HIGH (ref 2.5–4.6)
Potassium: 3.6 mmol/L (ref 3.5–5.1)
Sodium: 135 mmol/L (ref 135–145)

## 2017-04-11 MED ORDER — MORPHINE SULFATE (PF) 2 MG/ML IV SOLN: 1.0000 mg | INTRAVENOUS | 0 refills | Status: DC | PRN

## 2017-04-11 MED ORDER — SEVELAMER CARBONATE 800 MG PO TABS: 800.0000 mg | ORAL_TABLET | Freq: Three times a day (TID) | ORAL | Status: DC

## 2017-04-11 MED ORDER — VANCOMYCIN HCL IN DEXTROSE 1-5 GM/200ML-% IV SOLN: 1000.0000 mg | INTRAVENOUS | Status: AC

## 2017-04-11 MED ORDER — PIPERACILLIN-TAZOBACTAM 3.375 G IVPB
3.3750 g | Freq: Two times a day (BID) | INTRAVENOUS | Status: AC
Start: 2017-04-11 — End: 2017-04-15

## 2017-04-11 MED ORDER — CALCITRIOL 0.25 MCG PO CAPS: 0.2500 ug | ORAL_CAPSULE | ORAL | 0 refills | Status: AC

## 2017-04-11 MED ORDER — OXYCODONE HCL 5 MG PO TABS: 5.0000 mg | ORAL_TABLET | ORAL | 0 refills | Status: DC | PRN
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
## 2017-04-11 NOTE — Progress Notes (Signed)
[name] to be D/C'd Home per MD order.  Discussed prescriptions and follow up appointments with the patient. Prescriptions given to patient, medication list explained in detail. Pt verbalized understanding.  Allergies as of 04/11/2017      Reactions   Iohexol Swelling, Rash   Zanaflex [tizanidine] Other (See Comments)   Shut kidneys down Unable to walk or void   Ezetimibe-simvastatin Other (See Comments)   Elevated liver enzymes   Fluvastatin Other (See Comments)   Elevated liver enzymes   Simvastatin Other (See Comments)   Elevated liver enzymes      Medication List    TAKE these medications   calcitRIOL 0.25 MCG capsule Commonly known as:  ROCALTROL Take 1 capsule (0.25 mcg total) by mouth every Monday, Wednesday, and Friday with hemodialysis for 20 doses.   Glycerin (Adult) 2.1 g Supp Place 1 suppository rectally daily as needed for moderate constipation.   morphine 2 MG/ML injection Inject 0.5-1 mLs (1-2 mg total) into the vein every 2 (two) hours as needed.   oxyCODONE 5 MG immediate release tablet Commonly known as:  Oxy IR/ROXICODONE Take 1-2 tablets (5-10 mg total) by mouth every 4 (four) hours as needed for moderate pain.   piperacillin-tazobactam 3.375 GM/50ML IVPB Commonly known as:  ZOSYN Inject 50 mLs (3.375 g total) into the vein every 12 (twelve) hours for 4 days.   povidone-iodine 10 % external solution Commonly known as:  BETADINE Apply topically daily.   sevelamer carbonate 800 MG tablet Commonly known as:  RENVELA Take 1 tablet (800 mg total) by mouth 3 (three) times daily with meals.   vancomycin 1-5 GM/200ML-% Soln Commonly known as:  VANCOCIN Inject 200 mLs (1,000 mg total) into the vein every Monday, Wednesday, and Friday with hemodialysis for 4 doses. Start taking on:  04/12/2017       Vitals:   04/11/17 0653 04/11/17 1021  BP: (!) 105/49 (!) 104/44  Pulse: 73 70  Resp: 18 17  Temp: 97.6 F (36.4 C) 97.9 F (36.6 C)  SpO2:  100% 99%    Skin clean, dry and intact without evidence of skin break down, no evidence of skin tears noted. IV catheter discontinued intact. Site without signs and symptoms of complications. Dressing and pressure applied. Pt denies pain at this time. No complaints noted.  An After Visit Summary was printed and given to the patient. Patient escorted via Doctor, general practicetretcher, and D/C to [facility] with [facility].  Mariann BarterKellie Alaiyah Bollman BSN, RN [facility] Phone [phone]

## 2017-04-11 NOTE — Progress Notes (Signed)
3 Days Post-Op  Subjective:  No bleeding from sacral wound. Going to [facility] today.  That will be finances they can do hydrotherapy  Objective: Vital signs in last 24 hours: Temp:  [97.6 F (36.4 C)-98.1 F (36.7 C)] 97.6 F (36.4 C) (11/22 0653) Pulse Rate:  [68-83] 73 (11/22 0653) Resp:  [18] 18 (11/22 0653) BP: (102-109)/(49-83) 105/49 (11/22 0653) SpO2:  [100 %] 100 % (11/22 0653) Last BM Date: 04/10/17  Intake/Output from previous day: 11/21 0701 - 11/22 0700 In: 230 [P.O.:180; IV Piggyback:50] Out: 0  Intake/Output this shift: No intake/output data recorded.  General appearance: Alert.  No distress.  Somewhat cooperative.  Poor insight Incision/Wound: Sacral wound dressing clean and dry.  No bleeding  Lab Results:  Results for orders placed or performed during the hospital encounter of 04/01/17 (from the past 24 hour(s))  CBC with Differential/Platelet     Status: Abnormal   Collection Time: 04/11/17  2:59 AM  Result Value Ref Range   WBC 7.7 4.0 - 10.5 K/uL   RBC 2.98 (L) 4.22 - 5.81 MIL/uL   Hemoglobin 8.2 (L) 13.0 - 17.0 g/dL   HCT 16.126.8 (L) 09.639.0 - 04.552.0 %   MCV 89.9 78.0 - 100.0 fL   MCH 27.5 26.0 - 34.0 pg   MCHC 30.6 30.0 - 36.0 g/dL   RDW 40.916.6 (H) 81.111.5 - 91.415.5 %   Platelets 272 150 - 400 K/uL   Neutrophils Relative % 75 %   Neutro Abs 5.9 1.7 - 7.7 K/uL   Lymphocytes Relative 18 %   Lymphs Abs 1.4 0.7 - 4.0 K/uL   Monocytes Relative 5 %   Monocytes Absolute 0.4 0.1 - 1.0 K/uL   Eosinophils Relative 2 %   Eosinophils Absolute 0.1 0.0 - 0.7 K/uL   Basophils Relative 0 %   Basophils Absolute 0.0 0.0 - 0.1 K/uL  Renal function panel     Status: Abnormal   Collection Time: 04/11/17  2:59 AM  Result Value Ref Range   Sodium 135 135 - 145 mmol/L   Potassium 3.6 3.5 - 5.1 mmol/L   Chloride 97 (L) 101 - 111 mmol/L   CO2 28 22 - 32 mmol/L   Glucose, Bld 107 (H) 65 - 99 mg/dL   BUN 25 (H) 6 - 20 mg/dL   Creatinine, Ser 7.825.86 (H) 0.61 - 1.24 mg/dL   Calcium  8.4 (L) 8.9 - 10.3 mg/dL   Phosphorus 4.9 (H) 2.5 - 4.6 mg/dL   Albumin 1.8 (L) 3.5 - 5.0 g/dL   GFR calc non Af Amer 8 (L) >60 mL/min   GFR calc Af Amer 9 (L) >60 mL/min   Anion gap 10 5 - 15     Studies/Results: No results found.  . collagenase   Topical Daily  . feeding supplement  1 Container Oral BID BM  . feeding supplement (PRO-STAT SUGAR FREE 64)  30 mL Oral TID  . heparin  5,000 Units Subcutaneous Q8H  . multivitamin  1 tablet Oral QHS  . sevelamer carbonate  800 mg Oral TID WC     Assessment/Plan: s/p Procedure(s): AMPUTATION ABOVE KNEE LEFT DEBRIDMENT OF Sacral Wound  POD #3.    Debridement large sacral decubitus ulcer. Stable wound without bleeding.help -Diverting colostomy was offered to family last week and again on 11/19 and in both cases they declined. They understand this will complicate wound care -PT hydrotherapy -santyl -air mattress -wet to dry dressings. -Okay to [facility] to [facility] -Follow-up with us as  needed  S/p bilateral AKA End-stage renal disease on dialysis Dementia prior ETOH abuse Hypertension Diabetes Atherosclerotic peripheral vascular disease Coronary artery disease, history CABG History laparoscopic colon resection, details unknown    @PROBHOSP @  LOS: 9 days    [name] 04/11/2017  . .prob

## 2017-04-11 NOTE — Progress Notes (Signed)
[facility] Progress Note   Subjective:  Seen in room. Denies pain in stumps or sacral area. No CP or dyspnea. Nurse says may go to [facility] today ?   Objective Vitals:   04/10/17 0800 04/10/17 1704 04/10/17 2026 04/11/17 0653  BP: (!) 115/43 102/83 (!) 109/56 (!) 105/49  Pulse: 68 83 68 73  Resp: '18  18 18  '$ Temp: 98.4 F (36.9 C) 98 F (36.7 C) 98.1 F (36.7 C) 97.6 F (36.4 C)  TempSrc: Oral  Oral Oral  SpO2: 100% 100% 100% 100%  Weight:      Height:       Physical Exam General: Frail, elderly man, NAD Heart: RRR; no murmur Lungs: CTA anteriorly Extremities: B AKA with bandages present, clear/dry/intact. Dialysis Access: L forearm AVG + bruit  Additional Objective Labs: Basic Metabolic Panel: Recent Labs  Lab 04/08/17 1844 04/09/17 0800 04/11/17 0259  NA 136 137 135  K 3.9 4.0 3.6  CL 99* 101 97*  CO2 '26 25 28  '$ GLUCOSE 95 79 107*  BUN 15 22* 25*  CREATININE 5.33* 6.43* 5.86*  CALCIUM 8.6* 8.7* 8.4*  PHOS 5.2* 6.3* 4.9*   Liver Function Tests: Recent Labs  Lab 04/08/17 1844 04/09/17 0800 04/11/17 0259  ALBUMIN 1.9* 1.8* 1.8*   CBC: Recent Labs  Lab 04/05/17 0516 04/07/17 0709 04/08/17 1844 04/09/17 0800 04/11/17 0259  WBC 11.6* 11.9* 11.2* 10.8* 7.7  NEUTROABS  --   --   --   --  5.9  HGB 8.9* 8.7* 9.3* 8.6* 8.2*  HCT 28.7* 27.5* 30.6* 28.2* 26.8*  MCV 88.0 88.1 90.8 89.8 89.9  PLT 286 280 260 288 272   Blood Culture    Component Value Date/Time   SDES WOUND 04/08/2017 1519   SPECREQUEST LEFT FOOT GANGRENE 04/08/2017 1519   CULT HOLDING FOR POSSIBLE ANAEROBE 04/08/2017 1519   REPTSTATUS PENDING 04/08/2017 1519   Studies/Results: No results found. Medications: . sodium chloride 10 mL/hr at 04/08/17 1303  . piperacillin-tazobactam (ZOSYN)  IV Stopped (04/11/17 0319)  . [START ON 04/12/2017] vancomycin     . collagenase   Topical Daily  . feeding supplement  1 Container Oral BID BM  . feeding supplement (PRO-STAT  SUGAR FREE 64)  30 mL Oral TID  . heparin  5,000 Units Subcutaneous Q8H  . multivitamin  1 tablet Oral QHS  . sevelamer carbonate  800 mg Oral TID WC    Dialysis Orders: MWF [place] 4h 16mn 86kg Hep 8500 2/2 bath L AVG - Parsibiv 7.5 mcg IV TIW (not on hospital formulary) - Mircera 100 mcg IV Q 2 weeks (last dose 03/20/17 Last HGB 10.3 03/20/17) - Calcitriol 0.25 mcg PO TIW  BMD meds: No binders on OP med list  Assessment/Plan: 1. Gangrene L Foot (s/p L AKA 04/08/17 by Dr. CDonzetta Matters: Per VVS, pain controlled.  2. Sacral decubitus ulcer:General surgery consulted, s/p surgical debridement 04/08/17 by Dr. IDalbert Batman No diverting ostomy. WOC following. Concerned that he may not be able to sit in HD chair, possibly LTAC placement. 3. ESRD: Will continue MWF schedule via AVG, next HD on 04/12/17 (Friday).  4. BP/Volume: BP controlled and stable, weight down significantly from admit (~73kg), EDW change on d/c. 5. Anemia: Hgb down to 8.2. Last Aranesp 60 mcg on 04/07/17, will increase dose. 6. Metabolic bone disease: Corr Ca ok, VDRA and Parsabiv on hold during admit. Phos ok, continue Renvela. 7. Nutrition: Alb 1.8. Renal diet. Severe PCM probably D/T infection, poor intake. Continue  supps. 8. Dementia:Psych noted not competent to make decisions. Family has met with pall care, decided on DNR but elected to move forward with AKA and debridement of sacral ulcer.   Veneta Penton, PA-C 04/11/2017, 9:03 AM  [facility] Pager: [phone]  Patient seen and examined, agree with above note with above modifications. No new issues- HD tomorrow - cont with titration of HD related meds - if goes to [facility] we will follow him there Corliss Parish, MD 04/11/2017

## 2017-04-12 LAB — HEPATITIS B SURFACE ANTIBODY,QUALITATIVE: Hep B S Ab: REACTIVE

## 2017-04-12 LAB — HEPATITIS B SURFACE ANTIGEN: Hepatitis B Surface Ag: NEGATIVE

## 2017-04-13 LAB — AEROBIC/ANAEROBIC CULTURE W GRAM STAIN (SURGICAL/DEEP WOUND)

## 2017-04-13 LAB — AEROBIC/ANAEROBIC CULTURE (SURGICAL/DEEP WOUND): GRAM STAIN: NONE SEEN

## 2017-04-19 DIAGNOSIS — E1129 Type 2 diabetes mellitus with other diabetic kidney complication: Secondary | ICD-10-CM | POA: Diagnosis not present

## 2017-04-19 DIAGNOSIS — N186 End stage renal disease: Secondary | ICD-10-CM | POA: Diagnosis not present

## 2017-04-19 DIAGNOSIS — Z992 Dependence on renal dialysis: Secondary | ICD-10-CM | POA: Diagnosis not present

## 2017-05-03 ENCOUNTER — Other Ambulatory Visit (HOSPITAL_COMMUNITY): Payer: Self-pay | Admitting: Nephrology

## 2017-05-03 DIAGNOSIS — T82868A Thrombosis of vascular prosthetic devices, implants and grafts, initial encounter: Secondary | ICD-10-CM

## 2017-05-03 DIAGNOSIS — R0989 Other specified symptoms and signs involving the circulatory and respiratory systems: Secondary | ICD-10-CM | POA: Diagnosis not present

## 2017-05-06 ENCOUNTER — Other Ambulatory Visit: Payer: Self-pay | Admitting: Radiology

## 2017-05-06 ENCOUNTER — Ambulatory Visit (HOSPITAL_COMMUNITY)
Admission: RE | Admit: 2017-05-06 | Discharge: 2017-05-06 | Disposition: A | Discharge status: Home / Self Care | Payer: 59 | Source: Ambulatory Visit | Attending: Nephrology | Admitting: Nephrology

## 2017-05-06 ENCOUNTER — Other Ambulatory Visit (HOSPITAL_COMMUNITY): Payer: Self-pay | Admitting: Nephrology

## 2017-05-06 ENCOUNTER — Encounter (HOSPITAL_COMMUNITY): Payer: Self-pay | Admitting: Interventional Radiology

## 2017-05-06 DIAGNOSIS — T82868A Thrombosis of vascular prosthetic devices, implants and grafts, initial encounter: Secondary | ICD-10-CM

## 2017-05-06 DIAGNOSIS — I132 Hypertensive heart and chronic kidney disease with heart failure and with stage 5 chronic kidney disease, or end stage renal disease: Secondary | ICD-10-CM | POA: Insufficient documentation

## 2017-05-06 DIAGNOSIS — Z9862 Peripheral vascular angioplasty status: Secondary | ICD-10-CM | POA: Insufficient documentation

## 2017-05-06 DIAGNOSIS — E1122 Type 2 diabetes mellitus with diabetic chronic kidney disease: Secondary | ICD-10-CM | POA: Diagnosis not present

## 2017-05-06 DIAGNOSIS — Z89611 Acquired absence of right leg above knee: Secondary | ICD-10-CM | POA: Diagnosis not present

## 2017-05-06 DIAGNOSIS — Z79899 Other long term (current) drug therapy: Secondary | ICD-10-CM | POA: Insufficient documentation

## 2017-05-06 DIAGNOSIS — T82858A Stenosis of vascular prosthetic devices, implants and grafts, initial encounter: Secondary | ICD-10-CM | POA: Diagnosis not present

## 2017-05-06 DIAGNOSIS — N186 End stage renal disease: Secondary | ICD-10-CM | POA: Diagnosis not present

## 2017-05-06 DIAGNOSIS — Y832 Surgical operation with anastomosis, bypass or graft as the cause of abnormal reaction of the patient, or of later complication, without mention of misadventure at the time of the procedure: Secondary | ICD-10-CM | POA: Insufficient documentation

## 2017-05-06 HISTORY — PX: IR THROMBECTOMY AV FISTULA W/THROMBOLYSIS/PTA/STENT INC/SHUNT/IMG LT: IMG6107

## 2017-05-06 HISTORY — PX: IR US GUIDE VASC ACCESS LEFT: IMG2389

## 2017-05-06 LAB — POCT I-STAT 4, (NA,K, GLUC, HGB,HCT)
GLUCOSE: 116 mg/dL — AB (ref 65–99)
HCT: 30 % — ABNORMAL LOW (ref 39.0–52.0)
Hemoglobin: 10.2 g/dL — ABNORMAL LOW (ref 13.0–17.0)
Potassium: 3.4 mmol/L — ABNORMAL LOW (ref 3.5–5.1)
Sodium: 139 mmol/L (ref 135–145)

## 2017-05-06 LAB — GLUCOSE, CAPILLARY: Glucose-Capillary: 107 mg/dL — ABNORMAL HIGH (ref 65–99)

## 2017-05-06 MED ORDER — HEPARIN SODIUM (PORCINE) 1000 UNIT/ML IJ SOLN
INTRAMUSCULAR | Status: AC
  Filled 2017-05-06: qty 1

## 2017-05-06 MED ORDER — FENTANYL CITRATE (PF) 100 MCG/2ML IJ SOLN
INTRAMUSCULAR | Status: AC
  Filled 2017-05-06: qty 4

## 2017-05-06 MED ORDER — SODIUM CHLORIDE 0.9 % IV SOLN
INTRAVENOUS | Status: DC
  Administered 2017-05-06: 15:00:00 via INTRAVENOUS

## 2017-05-06 MED ORDER — ALTEPLASE 2 MG IJ SOLR
INTRAMUSCULAR | Status: AC
  Filled 2017-05-06: qty 4

## 2017-05-06 MED ORDER — LIDOCAINE HCL (PF) 1 % IJ SOLN
INTRAMUSCULAR | Status: AC | PRN
  Administered 2017-05-06: 2 mL

## 2017-05-06 MED ORDER — LIDOCAINE HCL 1 % IJ SOLN
INTRAMUSCULAR | Status: AC
  Filled 2017-05-06: qty 20

## 2017-05-06 MED ORDER — HEPARIN SODIUM (PORCINE) 1000 UNIT/ML IJ SOLN
INTRAMUSCULAR | Status: AC | PRN
  Administered 2017-05-06: 4000 [IU] via INTRAVENOUS

## 2017-05-06 MED ORDER — FENTANYL CITRATE (PF) 100 MCG/2ML IJ SOLN
INTRAMUSCULAR | Status: AC | PRN
  Administered 2017-05-06 (×3): 25 ug via INTRAVENOUS

## 2017-05-06 MED ORDER — IOPAMIDOL (ISOVUE-300) INJECTION 61%
INTRAVENOUS | Status: AC
Start: 2017-05-06 — End: 2017-05-06
  Administered 2017-05-06: 95 mL
  Filled 2017-05-06: qty 200

## 2017-05-06 MED ORDER — MIDAZOLAM HCL 2 MG/2ML IJ SOLN
INTRAMUSCULAR | Status: AC | PRN
  Administered 2017-05-06 (×3): 0.5 mg via INTRAVENOUS
  Administered 2017-05-06: 1 mg via INTRAVENOUS
  Administered 2017-05-06: 0.5 mg via INTRAVENOUS
  Administered 2017-05-06: 1 mg via INTRAVENOUS

## 2017-05-06 MED ORDER — ALTEPLASE 2 MG IJ SOLR
INTRAMUSCULAR | Status: AC | PRN
  Administered 2017-05-06: 4 mg

## 2017-05-06 MED ORDER — MIDAZOLAM HCL 2 MG/2ML IJ SOLN
INTRAMUSCULAR | Status: AC
  Filled 2017-05-06: qty 6

## 2017-05-06 MED ORDER — DIPHENHYDRAMINE HCL 50 MG/ML IJ SOLN
INTRAMUSCULAR | Status: AC
  Filled 2017-05-06: qty 1

## 2017-05-06 NOTE — Sedation Documentation (Signed)
Patient is resting comfortably. 

## 2017-05-06 NOTE — Progress Notes (Signed)
[facility] and advised will send written discharge instructions with client on discharge

## 2017-05-06 NOTE — Discharge Instructions (Signed)
°  Call if any problems,questions, or concerns [phone]  Fistulogram, Care After Refer to this sheet in the next few weeks. These instructions provide you with information on caring for yourself after your procedure. Your health care provider may also give you more specific instructions. Your treatment has been planned according to current medical practices, but problems sometimes occur. Call your health care provider if you have any problems or questions after your procedure. What can I expect after the procedure? After your procedure, it is typical to have the following:  A small amount of discomfort in the area where the catheters were placed.  A small amount of bruising around the fistula.  Sleepiness and fatigue.  Follow these instructions at home:  Rest at home for the day following your procedure.  Do not drive or operate heavy machinery while taking pain medicine.  Take medicines only as directed by your health care provider.  Do not take baths, swim, or use a hot tub until your health care provider approves. You may shower 24 hours after the procedure or as directed by your health care provider.  There are many different ways to close and cover an incision, including stitches, skin glue, and adhesive strips. Follow your health care provider's instructions on: ? Incision care. ? Bandage (dressing) changes and removal. ? Incision closure removal.  Monitor your dialysis fistula carefully. Contact a health care provider if:  You have drainage, redness, swelling, or pain at your catheter site.  You have a fever.  You have chills. Get help right away if:  You feel weak.  You have trouble balancing.  You have trouble moving your arms or legs.  You have problems with your speech or vision.  You can no longer feel a vibration or buzz when you put your fingers over your dialysis fistula.  The limb that was used for the procedure: ? Swells. ? Is painful. ? Is  cold. ? Is discolored, such as blue or pale white. This information is not intended to replace advice given to you by your health care provider. Make sure you discuss any questions you have with your health care provider. Document Released: 09/21/2013 Document Revised: 10/13/2015 Document Reviewed: 06/26/2013 Elsevier Interactive Patient Education  2018 [employer]

## 2017-05-06 NOTE — H&P (Signed)
Chief Complaint: Patient was seen in consultation today for declot of AVG at the request of Powell,Alvin C  Referring Physician(s): Lauris Poag  Supervising Physician: Simonne Come  Patient Status: [facility] - Out-pt  History of Present Illness: [name] is a 81 y.o. male with ESRD. He is at [facility] and normally has HD via (L)UE AVG. It has become thrombosed. They placed a (R)IJ HD cath and have requested declot of AVG PMHx, meds, labs, prior imaging reviewed. Has been NPO Also discussed with wife via telephone  Past Medical History:  Diagnosis Date  . Anginal pain (HCC)    before CABG  . Arthritis   . CHF (congestive heart failure) (HCC)   . Constipation   . Coronary artery disease   . Diabetes mellitus   . ESRD (end stage renal disease) (HCC)    TuesThurs Sat [place]  . Hypertension   . Myocardial infarction (HCC)   . Pneumonia    2 years ago    Past Surgical History:  Procedure Laterality Date  . A/V FISTULAGRAM Left 10/31/2016   Procedure: A/V Fistulagram;  Surgeon: Maeola Harman, MD;  Location: [facility] INVASIVE CV LAB;  Service: Cardiovascular;  Laterality: Left;  . ABDOMINAL AORTOGRAM W/LOWER EXTREMITY N/A 01/03/2017   Procedure: ABDOMINAL AORTOGRAM W/LOWER EXTREMITY;  Surgeon: Fransisco Hertz, MD;  Location: [facility] INVASIVE CV LAB;  Service: Cardiovascular;  Laterality: N/A;  . ABDOMINAL AORTOGRAM W/LOWER EXTREMITY N/A 02/06/2017   Procedure: ABDOMINAL AORTOGRAM W/LOWER EXTREMITY;  Surgeon: Maeola Harman, MD;  Location: [facility] INVASIVE CV LAB;  Service: Cardiovascular;  Laterality: N/A;  . AMPUTATION Right 02/14/2017   Procedure: RIGHT ABOVE KNEE AMPUTATION;  Surgeon: Maeola Harman, MD;  Location: [facility] OR;  Service: Vascular;  Laterality: Right;  . AMPUTATION Left 04/08/2017   Procedure: AMPUTATION ABOVE KNEE LEFT;  Surgeon: Maeola Harman, MD;  Location: [facility] OR;  Service: Vascular;  Laterality: Left;  . ARTERIOVENOUS GRAFT  PLACEMENT Left   . BACK SURGERY    . CORONARY ARTERY BYPASS GRAFT    . DEBRIDMENT OF DECUBITUS ULCER N/A 04/08/2017   Procedure: DEBRIDMENT OF Sacral Wound;  Surgeon: Claud Kelp, MD;  Location: [facility] OR;  Service: General;  Laterality: N/A;  . LAPAROSCOPIC COLON RESECTION     growth  . PERIPHERAL VASCULAR BALLOON ANGIOPLASTY Right 02/06/2017   Procedure: PERIPHERAL VASCULAR BALLOON ANGIOPLASTY;  Surgeon: Maeola Harman, MD;  Location: [facility] INVASIVE CV LAB;  Service: Cardiovascular;  Laterality: Right;  Anterior tibial  . REVISION OF ARTERIOVENOUS GORETEX GRAFT Left 11/16/2016   Procedure: REVISION OF ARTERIOVENOUS GORETEX GRAFT;  Surgeon: Chuck Hint, MD;  Location: [facility] OR;  Service: Vascular;  Laterality: Left;  . REVISON OF ARTERIOVENOUS FISTULA Left 09/11/2013   Procedure: REVISON OF ARTERIOVENOUS FISTULA-LEFT ARM; POSSIBLE VENOPLASTY WITH INTEROPERATIVE FISTULOGRAM;  Surgeon: Chuck Hint, MD;  Location: [facility] OR;  Service: Vascular;  Laterality: Left;    Allergies: Iohexol; Zanaflex [tizanidine]; Ezetimibe-simvastatin; Fluvastatin; and Simvastatin  Medications: Prior to Admission medications   Medication Sig Start Date End Date Taking? Authorizing Provider  calcitRIOL (ROCALTROL) 0.25 MCG capsule Take 1 capsule (0.25 mcg total) by mouth every Monday, Wednesday, and Friday with hemodialysis for 20 doses. 04/11/17 05/28/17  Rhetta Mura, MD  Glycerin, Adult, 2.1 g SUPP Place 1 suppository rectally daily as needed for moderate constipation. Patient not taking: Reported on 04/01/2017 02/19/17   Haydee Salter, MD  morphine 2 MG/ML injection Inject 0.5-1 mLs (1-2 mg total) into the vein every  2 (two) hours as needed. 04/11/17   Rhetta MuraSamtani, Jai-Gurmukh, MD  oxyCODONE (OXY IR/ROXICODONE) 5 MG immediate release tablet Take 1-2 tablets (5-10 mg total) by mouth every 4 (four) hours as needed for moderate pain. 04/11/17   Rhetta MuraSamtani, Jai-Gurmukh, MD  povidone-iodine  (BETADINE) 10 % external solution Apply topically daily. Patient not taking: Reported on 04/01/2017 02/20/17   Haydee SalterHobbs, Phillip M, MD  sevelamer carbonate (RENVELA) 800 MG tablet Take 1 tablet (800 mg total) by mouth 3 (three) times daily with meals. 04/11/17   Rhetta MuraSamtani, Jai-Gurmukh, MD     Family History  Problem Relation Age of Onset  . Cancer Father   . Diabetes Sister     Social History   Socioeconomic History  . Marital status: Married    Spouse name: Not on file  . Number of children: Not on file  . Years of education: Not on file  . Highest education level: Not on file  Social Needs  . Financial resource strain: Not on file  . Food insecurity - worry: Not on file  . Food insecurity - inability: Not on file  . Transportation needs - medical: Not on file  . Transportation needs - non-medical: Not on file  Occupational History  . Not on file  Tobacco Use  . Smoking status: Never Smoker  . Smokeless tobacco: Current User    Types: Snuff  Substance and Sexual Activity  . Alcohol use: No    Alcohol/week: 0.0 oz  . Drug use: No  . Sexual activity: Not on file  Other Topics Concern  . Not on file  Social History Narrative  . Not on file     Review of Systems: A 12 point ROS discussed and pertinent positives are indicated in the HPI above.  All other systems are negative.  Review of Systems  Vital Signs: There were no vitals taken for this visit.  Physical Exam  Constitutional: He is oriented to person, place, and time. He appears well-developed. No distress.  HENT:  Head: Normocephalic.  Mouth/Throat: Oropharynx is clear and moist.  Neck: Normal range of motion. No JVD present. No tracheal deviation present.  Cardiovascular: Normal rate, regular rhythm and normal heart sounds.  Pulmonary/Chest: Effort normal and breath sounds normal. No respiratory distress.  Musculoskeletal:  (L)forearm loop AVG without pulse/thrill  Neurological: He is alert and oriented to  person, place, and time.  Skin: Skin is warm and dry.  Psychiatric: He has a normal mood and affect.    Imaging: No results found.  Labs:  CBC: Recent Labs    04/07/17 0709 04/08/17 1844 04/09/17 0800 04/11/17 0259  WBC 11.9* 11.2* 10.8* 7.7  HGB 8.7* 9.3* 8.6* 8.2*  HCT 27.5* 30.6* 28.2* 26.8*  PLT 280 260 288 272    COAGS: Recent Labs    01/01/17 1429 04/01/17 2145  INR 1.10 1.30  APTT 34  --     BMP: Recent Labs    04/07/17 0709 04/08/17 1844 04/09/17 0800 04/11/17 0259  NA 135 136 137 135  K 3.6 3.9 4.0 3.6  CL 98* 99* 101 97*  CO2 26 26 25 28   GLUCOSE 78 95 79 107*  BUN 38* 15 22* 25*  CALCIUM 8.3* 8.6* 8.7* 8.4*  CREATININE 6.96* 5.33* 6.43* 5.86*  GFRNONAA 7* 9* 7* 8*  GFRAA 8* 11* 8* 9*    LIVER FUNCTION TESTS: Recent Labs    01/02/17 0847  01/25/17 1355 02/10/17 0403  04/01/17 2145  04/07/17 0709 04/08/17 1844  04/09/17 0800 04/11/17 0259  BILITOT 0.6  --  0.5 0.8  --  0.8  --   --   --   --   --   AST 145*  --  30 26  --  26  --   --   --   --   --   ALT 58  --  31 27  --  18  --   --   --   --   --   ALKPHOS 130*  --  145* 114  --  105  --   --   --   --   --   PROT 6.4*  --  7.2 6.6  --  7.1  --   --   --   --   --   ALBUMIN 2.9*   < > 3.1* 2.7*   < > 2.7*   < > 1.9* 1.9* 1.8* 1.8*   < > = values in this interval not displayed.    TUMOR MARKERS: No results for input(s): AFPTM, CEA, CA199, CHROMGRNA in the last 8760 hours.  Assessment and Plan: ESRD Clotted (L)UE AVG Discussed thrombolysis/thrombectomy of AV Graft/Fistula, possible angioplasty, possible stent, possible HD catheter placement if necessary. Risks, benefits, use of sedation thoroughly explained.  Thank you for this interesting consult.  I greatly enjoyed meeting Charlett BlakeWarren J Luviano and look forward to participating in their care.  A copy of this report was sent to the requesting provider on this date.  Electronically Signed: Brayton ElBRUNING, Kaleyah Labreck, PA-C 05/06/2017, 12:18  PM   I spent a total of 20 minutes in face to face in clinical consultation, greater than 50% of which was counseling/coordinating care for declot of (L)UE AVG

## 2017-05-06 NOTE — Sedation Documentation (Addendum)
Patient is resting comfortably. 

## 2017-05-06 NOTE — Progress Notes (Signed)
Client requested bedpan and stage 4 decubitus noted on buttocks 12"x12"; called Debbie,RN with rapid response and she did wet to dry dressing

## 2017-05-06 NOTE — Progress Notes (Signed)
Called [facility] for transport to [facility] 

## 2017-05-06 NOTE — Progress Notes (Signed)
[facility]

## 2017-05-06 NOTE — Procedures (Signed)
Technically successful declot of left forearm dialysis graft requiring overlapping stent placement within the draining brachial vein. EBL: Trace No immediate post procedural complications. Access is ready for immediate use.  Katherina RightJay Tamber Burtch, MD Pager #: [phone]

## 2017-05-08 DIAGNOSIS — Z4509 Encounter for adjustment and management of other cardiac device: Secondary | ICD-10-CM | POA: Diagnosis not present

## 2017-05-09 ENCOUNTER — Encounter (HOSPITAL_COMMUNITY): Payer: Self-pay | Admitting: Vascular Surgery

## 2017-05-10 ENCOUNTER — Encounter: Payer: Medicare Other | Admitting: Vascular Surgery

## 2017-05-21 DIAGNOSIS — Z992 Dependence on renal dialysis: Secondary | ICD-10-CM | POA: Diagnosis not present

## 2017-05-21 DIAGNOSIS — E1129 Type 2 diabetes mellitus with other diabetic kidney complication: Secondary | ICD-10-CM | POA: Diagnosis not present

## 2017-05-21 DIAGNOSIS — N186 End stage renal disease: Secondary | ICD-10-CM | POA: Diagnosis not present

## 2017-05-24 DIAGNOSIS — I2584 Coronary atherosclerosis due to calcified coronary lesion: Secondary | ICD-10-CM | POA: Diagnosis not present

## 2017-05-24 DIAGNOSIS — E1151 Type 2 diabetes mellitus with diabetic peripheral angiopathy without gangrene: Secondary | ICD-10-CM | POA: Diagnosis not present

## 2017-05-24 DIAGNOSIS — R0602 Shortness of breath: Secondary | ICD-10-CM | POA: Diagnosis not present

## 2017-05-24 DIAGNOSIS — N186 End stage renal disease: Secondary | ICD-10-CM | POA: Diagnosis not present

## 2017-05-24 DIAGNOSIS — G8922 Chronic post-thoracotomy pain: Secondary | ICD-10-CM | POA: Diagnosis not present

## 2017-05-24 DIAGNOSIS — D649 Anemia, unspecified: Secondary | ICD-10-CM | POA: Diagnosis not present

## 2017-05-24 DIAGNOSIS — Z66 Do not resuscitate: Secondary | ICD-10-CM | POA: Diagnosis not present

## 2017-05-24 DIAGNOSIS — M1A9XX0 Chronic gout, unspecified, without tophus (tophi): Secondary | ICD-10-CM | POA: Diagnosis not present

## 2017-05-24 DIAGNOSIS — R1084 Generalized abdominal pain: Secondary | ICD-10-CM | POA: Diagnosis not present

## 2017-05-24 DIAGNOSIS — Z9115 Patient's noncompliance with renal dialysis: Secondary | ICD-10-CM | POA: Diagnosis not present

## 2017-05-24 DIAGNOSIS — Z89611 Acquired absence of right leg above knee: Secondary | ICD-10-CM | POA: Diagnosis not present

## 2017-05-24 DIAGNOSIS — G8912 Acute post-thoracotomy pain: Secondary | ICD-10-CM | POA: Diagnosis not present

## 2017-05-24 DIAGNOSIS — Z4789 Encounter for other orthopedic aftercare: Secondary | ICD-10-CM | POA: Diagnosis not present

## 2017-05-24 DIAGNOSIS — D638 Anemia in other chronic diseases classified elsewhere: Secondary | ICD-10-CM | POA: Diagnosis not present

## 2017-05-24 DIAGNOSIS — I739 Peripheral vascular disease, unspecified: Secondary | ICD-10-CM | POA: Diagnosis not present

## 2017-05-24 DIAGNOSIS — I2510 Atherosclerotic heart disease of native coronary artery without angina pectoris: Secondary | ICD-10-CM | POA: Diagnosis not present

## 2017-05-24 DIAGNOSIS — R488 Other symbolic dysfunctions: Secondary | ICD-10-CM | POA: Diagnosis not present

## 2017-05-24 DIAGNOSIS — E11649 Type 2 diabetes mellitus with hypoglycemia without coma: Secondary | ICD-10-CM | POA: Diagnosis not present

## 2017-05-24 DIAGNOSIS — F419 Anxiety disorder, unspecified: Secondary | ICD-10-CM | POA: Diagnosis not present

## 2017-05-24 DIAGNOSIS — Z7189 Other specified counseling: Secondary | ICD-10-CM | POA: Diagnosis not present

## 2017-05-24 DIAGNOSIS — I132 Hypertensive heart and chronic kidney disease with heart failure and with stage 5 chronic kidney disease, or end stage renal disease: Secondary | ICD-10-CM | POA: Diagnosis not present

## 2017-05-24 DIAGNOSIS — D689 Coagulation defect, unspecified: Secondary | ICD-10-CM | POA: Diagnosis not present

## 2017-05-24 DIAGNOSIS — L89154 Pressure ulcer of sacral region, stage 4: Secondary | ICD-10-CM | POA: Diagnosis not present

## 2017-05-24 DIAGNOSIS — L89304 Pressure ulcer of unspecified buttock, stage 4: Secondary | ICD-10-CM | POA: Diagnosis not present

## 2017-05-24 DIAGNOSIS — G894 Chronic pain syndrome: Secondary | ICD-10-CM | POA: Diagnosis not present

## 2017-05-24 DIAGNOSIS — S78119A Complete traumatic amputation at level between unspecified hip and knee, initial encounter: Secondary | ICD-10-CM | POA: Diagnosis not present

## 2017-05-24 DIAGNOSIS — R262 Difficulty in walking, not elsewhere classified: Secondary | ICD-10-CM | POA: Diagnosis not present

## 2017-05-24 DIAGNOSIS — Z888 Allergy status to other drugs, medicaments and biological substances status: Secondary | ICD-10-CM | POA: Diagnosis not present

## 2017-05-24 DIAGNOSIS — F0390 Unspecified dementia without behavioral disturbance: Secondary | ICD-10-CM | POA: Diagnosis present

## 2017-05-24 DIAGNOSIS — Z89612 Acquired absence of left leg above knee: Secondary | ICD-10-CM | POA: Diagnosis not present

## 2017-05-24 DIAGNOSIS — I509 Heart failure, unspecified: Secondary | ICD-10-CM | POA: Diagnosis not present

## 2017-05-24 DIAGNOSIS — D509 Iron deficiency anemia, unspecified: Secondary | ICD-10-CM | POA: Diagnosis not present

## 2017-05-24 DIAGNOSIS — Z951 Presence of aortocoronary bypass graft: Secondary | ICD-10-CM | POA: Diagnosis not present

## 2017-05-24 DIAGNOSIS — Z87891 Personal history of nicotine dependence: Secondary | ICD-10-CM | POA: Diagnosis not present

## 2017-05-24 DIAGNOSIS — E876 Hypokalemia: Secondary | ICD-10-CM | POA: Diagnosis not present

## 2017-05-24 DIAGNOSIS — I120 Hypertensive chronic kidney disease with stage 5 chronic kidney disease or end stage renal disease: Secondary | ICD-10-CM | POA: Diagnosis not present

## 2017-05-24 DIAGNOSIS — Z515 Encounter for palliative care: Secondary | ICD-10-CM | POA: Diagnosis not present

## 2017-05-24 DIAGNOSIS — G9341 Metabolic encephalopathy: Secondary | ICD-10-CM | POA: Diagnosis not present

## 2017-05-24 DIAGNOSIS — M6281 Muscle weakness (generalized): Secondary | ICD-10-CM | POA: Diagnosis not present

## 2017-05-24 DIAGNOSIS — Z992 Dependence on renal dialysis: Secondary | ICD-10-CM | POA: Diagnosis not present

## 2017-05-24 DIAGNOSIS — R52 Pain, unspecified: Secondary | ICD-10-CM | POA: Diagnosis not present

## 2017-05-24 DIAGNOSIS — D631 Anemia in chronic kidney disease: Secondary | ICD-10-CM | POA: Diagnosis not present

## 2017-05-24 DIAGNOSIS — I10 Essential (primary) hypertension: Secondary | ICD-10-CM | POA: Diagnosis not present

## 2017-05-24 DIAGNOSIS — Z809 Family history of malignant neoplasm, unspecified: Secondary | ICD-10-CM | POA: Diagnosis not present

## 2017-05-24 DIAGNOSIS — I252 Old myocardial infarction: Secondary | ICD-10-CM | POA: Diagnosis not present

## 2017-05-24 DIAGNOSIS — E875 Hyperkalemia: Secondary | ICD-10-CM | POA: Diagnosis not present

## 2017-05-24 DIAGNOSIS — M1990 Unspecified osteoarthritis, unspecified site: Secondary | ICD-10-CM | POA: Diagnosis not present

## 2017-05-24 DIAGNOSIS — E119 Type 2 diabetes mellitus without complications: Secondary | ICD-10-CM | POA: Diagnosis not present

## 2017-05-24 DIAGNOSIS — R5381 Other malaise: Secondary | ICD-10-CM | POA: Diagnosis not present

## 2017-05-24 DIAGNOSIS — R1310 Dysphagia, unspecified: Secondary | ICD-10-CM | POA: Diagnosis not present

## 2017-05-24 DIAGNOSIS — L8992 Pressure ulcer of unspecified site, stage 2: Secondary | ICD-10-CM | POA: Diagnosis not present

## 2017-05-24 DIAGNOSIS — K5900 Constipation, unspecified: Secondary | ICD-10-CM | POA: Diagnosis not present

## 2017-05-24 DIAGNOSIS — E1122 Type 2 diabetes mellitus with diabetic chronic kidney disease: Secondary | ICD-10-CM | POA: Diagnosis not present

## 2017-05-24 DIAGNOSIS — Z833 Family history of diabetes mellitus: Secondary | ICD-10-CM | POA: Diagnosis not present

## 2017-05-24 DIAGNOSIS — T8249XA Other complication of vascular dialysis catheter, initial encounter: Secondary | ICD-10-CM | POA: Diagnosis not present

## 2017-05-24 DIAGNOSIS — N2581 Secondary hyperparathyroidism of renal origin: Secondary | ICD-10-CM | POA: Diagnosis not present

## 2017-05-27 DIAGNOSIS — R52 Pain, unspecified: Secondary | ICD-10-CM | POA: Diagnosis not present

## 2017-05-27 DIAGNOSIS — N186 End stage renal disease: Secondary | ICD-10-CM | POA: Diagnosis not present

## 2017-05-27 DIAGNOSIS — D649 Anemia, unspecified: Secondary | ICD-10-CM | POA: Diagnosis not present

## 2017-05-27 DIAGNOSIS — E119 Type 2 diabetes mellitus without complications: Secondary | ICD-10-CM | POA: Diagnosis not present

## 2017-05-27 DIAGNOSIS — D509 Iron deficiency anemia, unspecified: Secondary | ICD-10-CM | POA: Diagnosis not present

## 2017-05-27 DIAGNOSIS — D631 Anemia in chronic kidney disease: Secondary | ICD-10-CM | POA: Diagnosis not present

## 2017-05-27 DIAGNOSIS — E876 Hypokalemia: Secondary | ICD-10-CM | POA: Diagnosis not present

## 2017-05-27 DIAGNOSIS — D689 Coagulation defect, unspecified: Secondary | ICD-10-CM | POA: Diagnosis not present

## 2017-05-27 DIAGNOSIS — T8249XA Other complication of vascular dialysis catheter, initial encounter: Secondary | ICD-10-CM | POA: Diagnosis not present

## 2017-05-27 DIAGNOSIS — N2581 Secondary hyperparathyroidism of renal origin: Secondary | ICD-10-CM | POA: Diagnosis not present

## 2017-05-29 DIAGNOSIS — D689 Coagulation defect, unspecified: Secondary | ICD-10-CM | POA: Diagnosis not present

## 2017-05-29 DIAGNOSIS — R52 Pain, unspecified: Secondary | ICD-10-CM | POA: Diagnosis not present

## 2017-05-29 DIAGNOSIS — N186 End stage renal disease: Secondary | ICD-10-CM | POA: Diagnosis not present

## 2017-05-29 DIAGNOSIS — N2581 Secondary hyperparathyroidism of renal origin: Secondary | ICD-10-CM | POA: Diagnosis not present

## 2017-05-29 DIAGNOSIS — E876 Hypokalemia: Secondary | ICD-10-CM | POA: Diagnosis not present

## 2017-05-29 DIAGNOSIS — D631 Anemia in chronic kidney disease: Secondary | ICD-10-CM | POA: Diagnosis not present

## 2017-05-29 DIAGNOSIS — T8249XA Other complication of vascular dialysis catheter, initial encounter: Secondary | ICD-10-CM | POA: Diagnosis not present

## 2017-05-29 DIAGNOSIS — D509 Iron deficiency anemia, unspecified: Secondary | ICD-10-CM | POA: Diagnosis not present

## 2017-05-31 DIAGNOSIS — D689 Coagulation defect, unspecified: Secondary | ICD-10-CM | POA: Diagnosis not present

## 2017-05-31 DIAGNOSIS — N186 End stage renal disease: Secondary | ICD-10-CM | POA: Diagnosis not present

## 2017-05-31 DIAGNOSIS — R52 Pain, unspecified: Secondary | ICD-10-CM | POA: Diagnosis not present

## 2017-05-31 DIAGNOSIS — D631 Anemia in chronic kidney disease: Secondary | ICD-10-CM | POA: Diagnosis not present

## 2017-05-31 DIAGNOSIS — L89154 Pressure ulcer of sacral region, stage 4: Secondary | ICD-10-CM | POA: Diagnosis not present

## 2017-05-31 DIAGNOSIS — N2581 Secondary hyperparathyroidism of renal origin: Secondary | ICD-10-CM | POA: Diagnosis not present

## 2017-05-31 DIAGNOSIS — T8249XA Other complication of vascular dialysis catheter, initial encounter: Secondary | ICD-10-CM | POA: Diagnosis not present

## 2017-05-31 DIAGNOSIS — D509 Iron deficiency anemia, unspecified: Secondary | ICD-10-CM | POA: Diagnosis not present

## 2017-05-31 DIAGNOSIS — E876 Hypokalemia: Secondary | ICD-10-CM | POA: Diagnosis not present

## 2017-05-31 DIAGNOSIS — E119 Type 2 diabetes mellitus without complications: Secondary | ICD-10-CM | POA: Diagnosis not present

## 2017-06-03 DIAGNOSIS — E876 Hypokalemia: Secondary | ICD-10-CM | POA: Diagnosis not present

## 2017-06-03 DIAGNOSIS — Z89611 Acquired absence of right leg above knee: Secondary | ICD-10-CM | POA: Diagnosis not present

## 2017-06-03 DIAGNOSIS — R52 Pain, unspecified: Secondary | ICD-10-CM | POA: Diagnosis not present

## 2017-06-03 DIAGNOSIS — E119 Type 2 diabetes mellitus without complications: Secondary | ICD-10-CM | POA: Diagnosis not present

## 2017-06-03 DIAGNOSIS — N2581 Secondary hyperparathyroidism of renal origin: Secondary | ICD-10-CM | POA: Diagnosis not present

## 2017-06-03 DIAGNOSIS — D509 Iron deficiency anemia, unspecified: Secondary | ICD-10-CM | POA: Diagnosis not present

## 2017-06-03 DIAGNOSIS — N186 End stage renal disease: Secondary | ICD-10-CM | POA: Diagnosis not present

## 2017-06-03 DIAGNOSIS — T8249XA Other complication of vascular dialysis catheter, initial encounter: Secondary | ICD-10-CM | POA: Diagnosis not present

## 2017-06-03 DIAGNOSIS — D689 Coagulation defect, unspecified: Secondary | ICD-10-CM | POA: Diagnosis not present

## 2017-06-03 DIAGNOSIS — D631 Anemia in chronic kidney disease: Secondary | ICD-10-CM | POA: Diagnosis not present

## 2017-06-07 ENCOUNTER — Emergency Department (HOSPITAL_COMMUNITY): Payer: Medicare Other

## 2017-06-07 ENCOUNTER — Other Ambulatory Visit: Payer: Self-pay

## 2017-06-07 ENCOUNTER — Encounter (HOSPITAL_COMMUNITY): Payer: Self-pay

## 2017-06-07 ENCOUNTER — Inpatient Hospital Stay (HOSPITAL_COMMUNITY)
Admission: EM | Admit: 2017-06-07 | Discharge: 2017-06-09 | DRG: 951 | Disposition: A | Discharge status: Hospice | Payer: Medicare Other | Attending: Internal Medicine | Admitting: Internal Medicine

## 2017-06-07 DIAGNOSIS — E11649 Type 2 diabetes mellitus with hypoglycemia without coma: Secondary | ICD-10-CM | POA: Diagnosis not present

## 2017-06-07 DIAGNOSIS — E118 Type 2 diabetes mellitus with unspecified complications: Secondary | ICD-10-CM | POA: Diagnosis present

## 2017-06-07 DIAGNOSIS — Z9115 Patient's noncompliance with renal dialysis: Secondary | ICD-10-CM

## 2017-06-07 DIAGNOSIS — I120 Hypertensive chronic kidney disease with stage 5 chronic kidney disease or end stage renal disease: Secondary | ICD-10-CM | POA: Diagnosis not present

## 2017-06-07 DIAGNOSIS — Z515 Encounter for palliative care: Secondary | ICD-10-CM | POA: Diagnosis not present

## 2017-06-07 DIAGNOSIS — E1122 Type 2 diabetes mellitus with diabetic chronic kidney disease: Secondary | ICD-10-CM | POA: Diagnosis not present

## 2017-06-07 DIAGNOSIS — I132 Hypertensive heart and chronic kidney disease with heart failure and with stage 5 chronic kidney disease, or end stage renal disease: Secondary | ICD-10-CM | POA: Diagnosis present

## 2017-06-07 DIAGNOSIS — I509 Heart failure, unspecified: Secondary | ICD-10-CM | POA: Diagnosis not present

## 2017-06-07 DIAGNOSIS — Z809 Family history of malignant neoplasm, unspecified: Secondary | ICD-10-CM

## 2017-06-07 DIAGNOSIS — Z833 Family history of diabetes mellitus: Secondary | ICD-10-CM

## 2017-06-07 DIAGNOSIS — Z89612 Acquired absence of left leg above knee: Secondary | ICD-10-CM

## 2017-06-07 DIAGNOSIS — Z87891 Personal history of nicotine dependence: Secondary | ICD-10-CM

## 2017-06-07 DIAGNOSIS — F0390 Unspecified dementia without behavioral disturbance: Secondary | ICD-10-CM | POA: Diagnosis present

## 2017-06-07 DIAGNOSIS — Z992 Dependence on renal dialysis: Secondary | ICD-10-CM

## 2017-06-07 DIAGNOSIS — Z888 Allergy status to other drugs, medicaments and biological substances status: Secondary | ICD-10-CM | POA: Diagnosis not present

## 2017-06-07 DIAGNOSIS — F419 Anxiety disorder, unspecified: Secondary | ICD-10-CM | POA: Diagnosis not present

## 2017-06-07 DIAGNOSIS — Z89611 Acquired absence of right leg above knee: Secondary | ICD-10-CM

## 2017-06-07 DIAGNOSIS — E1151 Type 2 diabetes mellitus with diabetic peripheral angiopathy without gangrene: Secondary | ICD-10-CM | POA: Diagnosis not present

## 2017-06-07 DIAGNOSIS — I252 Old myocardial infarction: Secondary | ICD-10-CM

## 2017-06-07 DIAGNOSIS — N186 End stage renal disease: Secondary | ICD-10-CM | POA: Diagnosis not present

## 2017-06-07 DIAGNOSIS — Z66 Do not resuscitate: Secondary | ICD-10-CM | POA: Diagnosis not present

## 2017-06-07 DIAGNOSIS — K5900 Constipation, unspecified: Secondary | ICD-10-CM | POA: Diagnosis not present

## 2017-06-07 DIAGNOSIS — L89304 Pressure ulcer of unspecified buttock, stage 4: Secondary | ICD-10-CM | POA: Diagnosis not present

## 2017-06-07 DIAGNOSIS — Z7401 Bed confinement status: Secondary | ICD-10-CM

## 2017-06-07 DIAGNOSIS — Z7189 Other specified counseling: Secondary | ICD-10-CM

## 2017-06-07 DIAGNOSIS — L89154 Pressure ulcer of sacral region, stage 4: Secondary | ICD-10-CM | POA: Diagnosis not present

## 2017-06-07 DIAGNOSIS — I2510 Atherosclerotic heart disease of native coronary artery without angina pectoris: Secondary | ICD-10-CM | POA: Diagnosis not present

## 2017-06-07 DIAGNOSIS — R0602 Shortness of breath: Secondary | ICD-10-CM | POA: Diagnosis not present

## 2017-06-07 DIAGNOSIS — M1990 Unspecified osteoarthritis, unspecified site: Secondary | ICD-10-CM | POA: Diagnosis present

## 2017-06-07 DIAGNOSIS — R1084 Generalized abdominal pain: Secondary | ICD-10-CM | POA: Diagnosis not present

## 2017-06-07 DIAGNOSIS — E875 Hyperkalemia: Secondary | ICD-10-CM | POA: Diagnosis present

## 2017-06-07 DIAGNOSIS — Z951 Presence of aortocoronary bypass graft: Secondary | ICD-10-CM

## 2017-06-07 LAB — CBC WITH DIFFERENTIAL/PLATELET
BASOS ABS: 0.1 10*3/uL (ref 0.0–0.1)
Basophils Relative: 1 %
Eosinophils Absolute: 0.3 10*3/uL (ref 0.0–0.7)
Eosinophils Relative: 4 %
HCT: 34.6 % — ABNORMAL LOW (ref 39.0–52.0)
Hemoglobin: 10.5 g/dL — ABNORMAL LOW (ref 13.0–17.0)
LYMPHS PCT: 13 %
Lymphs Abs: 1.1 10*3/uL (ref 0.7–4.0)
MCH: 26.7 pg (ref 26.0–34.0)
MCHC: 30.3 g/dL (ref 30.0–36.0)
MCV: 88 fL (ref 78.0–100.0)
MONO ABS: 0.6 10*3/uL (ref 0.1–1.0)
MONOS PCT: 7 %
Neutro Abs: 6.3 10*3/uL (ref 1.7–7.7)
Neutrophils Relative %: 75 %
Platelets: 274 10*3/uL (ref 150–400)
RBC: 3.93 MIL/uL — ABNORMAL LOW (ref 4.22–5.81)
RDW: 17 % — AB (ref 11.5–15.5)
WBC: 8.4 10*3/uL (ref 4.0–10.5)

## 2017-06-07 LAB — COMPREHENSIVE METABOLIC PANEL
ALBUMIN: 2.1 g/dL — AB (ref 3.5–5.0)
ALT: 15 U/L — ABNORMAL LOW (ref 17–63)
AST: 23 U/L (ref 15–41)
Alkaline Phosphatase: 112 U/L (ref 38–126)
Anion gap: 13 (ref 5–15)
BILIRUBIN TOTAL: 0.7 mg/dL (ref 0.3–1.2)
BUN: 43 mg/dL — ABNORMAL HIGH (ref 6–20)
CO2: 21 mmol/L — AB (ref 22–32)
Calcium: 9.4 mg/dL (ref 8.9–10.3)
Chloride: 93 mmol/L — ABNORMAL LOW (ref 101–111)
Creatinine, Ser: 6.96 mg/dL — ABNORMAL HIGH (ref 0.61–1.24)
GFR calc Af Amer: 8 mL/min — ABNORMAL LOW (ref >60)
GFR calc non Af Amer: 7 mL/min — ABNORMAL LOW (ref >60)
GLUCOSE: 73 mg/dL (ref 65–99)
Potassium: 6 mmol/L — ABNORMAL HIGH (ref 3.5–5.1)
Sodium: 127 mmol/L — ABNORMAL LOW (ref 135–145)
TOTAL PROTEIN: 5.5 g/dL — AB (ref 6.5–8.1)

## 2017-06-07 MED ORDER — OXYCODONE HCL 5 MG PO TABS: 5.0000 mg | ORAL_TABLET | ORAL | Status: DC | PRN

## 2017-06-07 MED ORDER — SODIUM POLYSTYRENE SULFONATE 15 GM/60ML PO SUSP
30.0000 g | Freq: Once | ORAL | Status: AC
  Administered 2017-06-07: 30 g via ORAL
  Filled 2017-06-07 (×2): qty 120

## 2017-06-07 MED ORDER — ONDANSETRON HCL 4 MG/2ML IJ SOLN: 4.0000 mg | Freq: Four times a day (QID) | INTRAMUSCULAR | Status: DC | PRN

## 2017-06-07 MED ORDER — VANCOMYCIN HCL 10 G IV SOLR
1750.0000 mg | Freq: Once | INTRAVENOUS | Status: AC
  Administered 2017-06-07: 1750 mg via INTRAVENOUS
  Filled 2017-06-07: qty 1750

## 2017-06-07 MED ORDER — PIPERACILLIN-TAZOBACTAM 3.375 G IVPB
3.3750 g | Freq: Two times a day (BID) | INTRAVENOUS | Status: DC
  Administered 2017-06-08: 3.375 g via INTRAVENOUS
  Filled 2017-06-07 (×2): qty 50

## 2017-06-07 MED ORDER — SODIUM CHLORIDE 0.9% FLUSH: 3.0000 mL | INTRAVENOUS | Status: DC | PRN

## 2017-06-07 MED ORDER — ACETAMINOPHEN 650 MG RE SUPP: 650.0000 mg | Freq: Four times a day (QID) | RECTAL | Status: DC | PRN

## 2017-06-07 MED ORDER — DAKINS (1/4 STRENGTH) 0.125 % EX SOLN
Freq: Once | CUTANEOUS | Status: AC
  Administered 2017-06-07: 22:00:00
  Filled 2017-06-07: qty 473

## 2017-06-07 MED ORDER — SODIUM CHLORIDE 0.9% FLUSH
3.0000 mL | Freq: Two times a day (BID) | INTRAVENOUS | Status: DC
  Administered 2017-06-08 (×2): 3 mL via INTRAVENOUS

## 2017-06-07 MED ORDER — PIPERACILLIN-TAZOBACTAM 3.375 G IVPB 30 MIN
3.3750 g | Freq: Once | INTRAVENOUS | Status: AC
  Administered 2017-06-07: 3.375 g via INTRAVENOUS
  Filled 2017-06-07: qty 50

## 2017-06-07 MED ORDER — VANCOMYCIN HCL 10 G IV SOLR: 1750.0000 mg | Freq: Once | INTRAVENOUS | Status: DC

## 2017-06-07 MED ORDER — ACETAMINOPHEN 325 MG PO TABS: 650.0000 mg | ORAL_TABLET | Freq: Four times a day (QID) | ORAL | Status: DC | PRN

## 2017-06-07 MED ORDER — ALBUTEROL SULFATE (2.5 MG/3ML) 0.083% IN NEBU
INHALATION_SOLUTION | RESPIRATORY_TRACT | Status: AC
  Filled 2017-06-07: qty 3

## 2017-06-07 MED ORDER — ONDANSETRON HCL 4 MG PO TABS: 4.0000 mg | ORAL_TABLET | Freq: Four times a day (QID) | ORAL | Status: DC | PRN

## 2017-06-07 MED ORDER — HEPARIN SODIUM (PORCINE) 5000 UNIT/ML IJ SOLN
5000.0000 [IU] | Freq: Three times a day (TID) | INTRAMUSCULAR | Status: DC
  Administered 2017-06-08: 5000 [IU] via SUBCUTANEOUS
  Filled 2017-06-07: qty 1

## 2017-06-07 MED ORDER — SEVELAMER CARBONATE 800 MG PO TABS: 800.0000 mg | ORAL_TABLET | Freq: Three times a day (TID) | ORAL | Status: DC

## 2017-06-07 MED ORDER — ALBUTEROL SULFATE (2.5 MG/3ML) 0.083% IN NEBU
10.0000 mg | INHALATION_SOLUTION | Freq: Once | RESPIRATORY_TRACT | Status: AC
  Administered 2017-06-07: 10 mg via RESPIRATORY_TRACT
  Filled 2017-06-07: qty 12

## 2017-06-07 MED ORDER — SODIUM CHLORIDE 0.9 % IV SOLN: 250.0000 mL | INTRAVENOUS | Status: DC | PRN

## 2017-06-07 NOTE — ED Triage Notes (Signed)
[facility] for flank pain and "confusion". Per facility pt refused dialysis both Wednesday and Friday, last tx was Monday. Pt attempted to refuse transport to hospital, facility told him he was not allowed to refuse as he was altered. Pt alert and oriented to person, time, and situation. On scene when asked for location, pt stated [facility] [place] Per EMS pt has large sacral wound that causes pt a lot of pain. Family was on scene and told pt they would begin hospice care, to which pt agreed to come to the hospital.

## 2017-06-07 NOTE — ED Notes (Signed)
Nephrology PA at bedside.  

## 2017-06-07 NOTE — ED Notes (Signed)
EMERGENCY DEPARTMENT  US GUIDANCE EXAM Emergency Ultrasound:  US Guidance for Needle Guidance  INDICATIONS: Difficult vascular access Linear probe used in real-time to visualize location of needle entry through skin.   PERFORMED BY: Myself IMAGES ARCHIVED?: No LIMITATIONS: None VIEWS USED: Transverse INTERPRETATION: Needle visualized within vein   Tolerated well w/no complications.   Arlys JohnBrian RN  5:05 PM 06/07/17

## 2017-06-07 NOTE — ED Provider Notes (Addendum)
[facility] EMERGENCY DEPARTMENT Provider Note   CSN: [id number] Arrival date & time: 06/07/17  1218     History   Chief Complaint Chief Complaint  Patient presents with  . Missed Dialysis  . Flank Pain    HPI [name] is a 82 y.o. male.  82 year old male with history of ESRD on HD, diabetes, hypertension, CHF, and bilateral AKA presents for evaluation following refusal of dialysis.  Patient arrives from his SNF in [place] by EMS.  Patient reportedly has refused to go to dialysis for the last 2 sessions.  Patient appears to be pleasantly confused upon my evaluation.  His primary complaint is pain is decubitus.  EMS reports that the family convinced the patient to come by promising the patient a hospice evaluation.  It is unclear to this provider as to whether the patient has capacity to make his own decisions.   He has a valid DNR.    The history is provided by the patient, medical records and the EMS personnel. The history is limited by the condition of the patient.  Illness  This is a chronic problem. The current episode started more than 1 week ago. The problem occurs constantly. The problem has not changed since onset.Pertinent negatives include no chest pain and no abdominal pain. Nothing aggravates the symptoms. Nothing relieves the symptoms. He has tried nothing for the symptoms. The treatment provided no relief.    Past Medical History:  Diagnosis Date  . Anginal pain (HCC)    before CABG  . Arthritis   . CHF (congestive heart failure) (HCC)   . Constipation   . Coronary artery disease   . Diabetes mellitus   . ESRD (end stage renal disease) (HCC)    TuesThurs Sat [place]  . Hypertension   . Myocardial infarction (HCC)   . Pneumonia    2 years ago    Patient Active Problem List   Diagnosis Date Noted  . Goals of care, counseling/discussion   . Sacral wound, subsequent encounter   . DNR (do not resuscitate) discussion   .  Palliative care by specialist   . Gangrene of left foot (HCC) 04/02/2017  . Cellulitis of left foot 04/02/2017  . Dementia 04/02/2017  . Evaluation by psychiatric service required 04/02/2017  . Pressure injury of skin 02/11/2017  . Gangrene of foot (HCC) 02/10/2017  . Sepsis due to cellulitis (HCC) 01/01/2017  . Hypertension 01/01/2017  . Coronary artery disease 01/01/2017  . CKD (chronic kidney disease) stage V requiring chronic dialysis (HCC) 01/01/2017  . Peripheral vascular disease of lower extremity with ulceration/RLE cellulitis (HCC) 01/01/2017  . Acute metabolic encephalopathy 01/01/2017  . Diabetes mellitus with complication (HCC)   . Hyperkalemia 12/26/2016  . Ulcer of right foot due to type 2 diabetes mellitus (HCC) 12/26/2016  . Cellulitis of right foot 12/26/2016  . Noncompliance of patient with renal dialysis (HCC) 12/26/2016  . End stage renal disease (HCC) 09/09/2013    Past Surgical History:  Procedure Laterality Date  . A/V FISTULAGRAM Left 10/31/2016   Procedure: A/V Fistulagram;  Surgeon: Maeola Harman, MD;  Location[facility] INVASIVE CV LAB;  Service: Cardiovascular;  Laterality: Left;  . ABDOMINAL AORTOGRAM W/LOWER EXTREMITY N/A 01/03/2017   Procedure: ABDOMINAL AORTOGRAM W/LOWER EXTREMITY;  Surgeon: Fransisco Hertz, MD;  Location: [facility] INVASIVE CV LAB;  Service: Cardiovascular;  Laterality: N/A;  . ABDOMINAL AORTOGRAM W/LOWER EXTREMITY N/A 02/06/2017   Procedure: ABDOMINAL AORTOGRAM W/LOWER EXTREMITY;  Surgeon: Randie Heinz,  Dennard Schaumann, MD;  Location: [facility] INVASIVE CV LAB;  Service: Cardiovascular;  Laterality: N/A;  . AMPUTATION Right 02/14/2017   Procedure: RIGHT ABOVE KNEE AMPUTATION;  Surgeon: Maeola Harman, MD;  Location: [facility] OR;  Service: Vascular;  Laterality: Right;  . AMPUTATION Left 04/08/2017   Procedure: AMPUTATION ABOVE KNEE LEFT;  Surgeon: Maeola Harman, MD;  Location: [facility] OR;  Service: Vascular;  Laterality: Left;  .  ARTERIOVENOUS GRAFT PLACEMENT Left   . BACK SURGERY    . CORONARY ARTERY BYPASS GRAFT    . DEBRIDMENT OF DECUBITUS ULCER N/A 04/08/2017   Procedure: DEBRIDMENT OF Sacral Wound;  Surgeon: Claud Kelp, MD;  Location: [facility] OR;  Service: General;  Laterality: N/A;  . IR THROMBECTOMY AV FISTULA W/THROMBOLYSIS/PTA/STENT INC/SHUNT/IMG LT Left 05/06/2017  . IR US GUIDE VASC ACCESS LEFT  05/06/2017  . LAPAROSCOPIC COLON RESECTION     growth  . PERIPHERAL VASCULAR BALLOON ANGIOPLASTY Right 02/06/2017   Procedure: PERIPHERAL VASCULAR BALLOON ANGIOPLASTY;  Surgeon: Maeola Harman, MD;  Location: [facility] INVASIVE CV LAB;  Service: Cardiovascular;  Laterality: Right;  Anterior tibial  . REVISION OF ARTERIOVENOUS GORETEX GRAFT Left 11/16/2016   Procedure: REVISION OF ARTERIOVENOUS GORETEX GRAFT;  Surgeon: Chuck Hint, MD;  Location: [facility] OR;  Service: Vascular;  Laterality: Left;  . REVISON OF ARTERIOVENOUS FISTULA Left 09/11/2013   Procedure: REVISON OF ARTERIOVENOUS FISTULA-LEFT ARM; POSSIBLE VENOPLASTY WITH INTEROPERATIVE FISTULOGRAM;  Surgeon: Chuck Hint, MD;  Location: [facility] OR;  Service: Vascular;  Laterality: Left;       Home Medications    Prior to Admission medications   Medication Sig Start Date End Date Taking? Authorizing Provider  Glycerin, Adult, 2.1 g SUPP Place 1 suppository rectally daily as needed for moderate constipation. Patient not taking: Reported on 04/01/2017 02/19/17   Haydee Salter, MD  morphine 2 MG/ML injection Inject 0.5-1 mLs (1-2 mg total) into the vein every 2 (two) hours as needed. 04/11/17   Rhetta Mura, MD  oxyCODONE (OXY IR/ROXICODONE) 5 MG immediate release tablet Take 1-2 tablets (5-10 mg total) by mouth every 4 (four) hours as needed for moderate pain. 04/11/17   Rhetta Mura, MD  povidone-iodine (BETADINE) 10 % external solution Apply topically daily. Patient not taking: Reported on 04/01/2017 02/20/17   Haydee Salter,  MD  sevelamer carbonate (RENVELA) 800 MG tablet Take 1 tablet (800 mg total) by mouth 3 (three) times daily with meals. 04/11/17   Rhetta Mura, MD    Family History Family History  Problem Relation Age of Onset  . Cancer Father   . Diabetes Sister     Social History Social History   Tobacco Use  . Smoking status: Never Smoker  . Smokeless tobacco: Current User    Types: Snuff  Substance Use Topics  . Alcohol use: No    Alcohol/week: 0.0 oz  . Drug use: No     Allergies   Iohexol; Zanaflex [tizanidine]; Ezetimibe-simvastatin; Fluvastatin; and Simvastatin   Review of Systems Review of Systems  Unable to perform ROS: Dementia  Cardiovascular: Negative for chest pain.  Gastrointestinal: Negative for abdominal pain.  All other systems reviewed and are negative.    Physical Exam Updated Vital Signs BP 134/66   Resp 12   Physical Exam  Constitutional: He appears well-developed and well-nourished. No distress.  HENT:  Head: Normocephalic and atraumatic.  Mouth/Throat: Oropharynx is clear and moist.  Eyes: Conjunctivae and EOM are normal. Pupils are equal, round, and reactive to light.  Neck: Normal range of motion. Neck supple.  Cardiovascular: Normal rate, regular rhythm and normal heart sounds.  Pulmonary/Chest: Effort normal and breath sounds normal. No respiratory distress.  Abdominal: Soft. He exhibits no distension. There is no tenderness.  Musculoskeletal: Normal range of motion. He exhibits no edema or deformity.  Sp bilateral AKA   Neurological: He is alert.  Pleasantly confused.   Skin: Skin is warm and dry.  Psychiatric: He has a normal mood and affect.  Nursing note and vitals reviewed.    ED Treatments / Results  Labs (all labs ordered are listed, but only abnormal results are displayed) Labs Reviewed  COMPREHENSIVE METABOLIC PANEL - Abnormal; Notable for the following components:      Result Value   Sodium 127 (*)    Potassium 6.0  (*)    Chloride 93 (*)    CO2 21 (*)    BUN 43 (*)    Creatinine, Ser 6.96 (*)    Total Protein 5.5 (*)    Albumin 2.1 (*)    ALT 15 (*)    GFR calc non Af Amer 7 (*)    GFR calc Af Amer 8 (*)    All other components within normal limits  CBC WITH DIFFERENTIAL/PLATELET - Abnormal; Notable for the following components:   RBC 3.93 (*)    Hemoglobin 10.5 (*)    HCT 34.6 (*)    RDW 17.0 (*)    All other components within normal limits    EKG  EKG Interpretation  Date/Time:  Friday June 07 2017 12:38:53 EST Ventricular Rate:  86 PR Interval:    QRS Duration: 90 QT Interval:  338 QTC Calculation: 405 R Axis:   19 Text Interpretation:  Sinus rhythm Nonspecific T abnormalities, lateral leads Confirmed by Kristine RoyalMessick, Peter [phone]([id number]) on 06/07/2017 3:32:50 PM       Radiology Dg Chest Port 1 View  Result Date: 06/07/2017 CLINICAL DATA:  Short of breath EXAM: PORTABLE CHEST 1 VIEW COMPARISON:  04/01/2017 FINDINGS: Borderline cardiomegaly. Low volumes. Bibasilar atelectasis. Left jugular tunneled dialysis catheter placed with its tip at the cavoatrial junction. No pneumothorax IMPRESSION: Bibasilar atelectasis Left jugular dialysis catheter with its tip at the cavoatrial junction. No pneumothorax. Electronically Signed   By: Jolaine ClickArthur  Hoss M.D.   On: 06/07/2017 13:29    Procedures Procedures (including critical care time)  Medications Ordered in ED Medications - No data to display   Initial Impression / Assessment and Plan / ED Course  I have reviewed the triage vital signs and the nursing notes.  Pertinent labs & imaging results that were available during my care of the patient were reviewed by me and considered in my medical decision making (see chart for details).     MDM screen complete  Patient is presenting after apparently refusing dialysis.  It is unclear what the capacity to refuse dialysis.  If patient is a candidate for hospice it appears that his SNF in [place] could  not arrange that. I suspect that admission for determination of capacity and then likely Hospice consult is required.   [phone] Hospitalist service is aware of case and will evaluate for admission.  [phone] Nephrology aware of case.  Nephrology feels that the patient's capacity to make medical decisions must be determined.  This shoul occur prior to any dialysis session.   Final Clinical Impressions(s) / ED Diagnoses   Final diagnoses:  ESRD (end stage renal disease) [facility](HCC)    ED Discharge Orders    None  Wynetta Fines, MD 06/07/17 1600    Wynetta Fines, MD 06/07/17 [phone]

## 2017-06-07 NOTE — Consult Note (Signed)
Reason for Consult:ESRD Referring Physician:  Dr. Duanne Moron [name] is an 82 y.o. male.  HPI: 82 yr male with DM, HTN, CAD, DCD, severe dementia, Anemia, HPTH, bilat LE amp with PVD, large sacral decub.  Was in [facility] after hosp here in [place], then to [facility] in [place].  Refuses HD freq, shortens tx even more. Refused to go to HD x 2 this week. Brought here with refusal to go and promise to keep comfortable.  He says his backside hurts.  He does not want anymore dialysis.  Has been deemed to not have capacity. Review of systems not obtained due to patient factors.   Dialyzes at [facility] on MWF . Primary Nephrologist Justin Mend. . . [facility].  Past Medical History:  Diagnosis Date  . Anginal pain ([place])    before CABG  . Arthritis   . CHF (congestive heart failure) ([place])   . Constipation   . Coronary artery disease   . Diabetes mellitus   . ESRD (end stage renal disease) ([place])    TuesThurs Sat [place]  . Hypertension   . Myocardial infarction ([place])   . Pneumonia    2 years ago    Past Surgical History:  Procedure Laterality Date  . A/V FISTULAGRAM Left 10/31/2016   Procedure: A/V Fistulagram;  Surgeon: Waynetta Sandy, MD;  Location: [place] CV LAB;  Service: Cardiovascular;  Laterality: Left;  . ABDOMINAL AORTOGRAM W/LOWER EXTREMITY N/A 01/03/2017   Procedure: ABDOMINAL AORTOGRAM W/LOWER EXTREMITY;  Surgeon: Conrad Lee, MD;  Location: [place] CV LAB;  Service: Cardiovascular;  Laterality: N/A;  . ABDOMINAL AORTOGRAM W/LOWER EXTREMITY N/A 02/06/2017   Procedure: ABDOMINAL AORTOGRAM W/LOWER EXTREMITY;  Surgeon: Waynetta Sandy, MD;  Location: [place] CV LAB;  Service: Cardiovascular;  Laterality: N/A;  . AMPUTATION Right 02/14/2017   Procedure: RIGHT ABOVE KNEE AMPUTATION;  Surgeon: Waynetta Sandy, MD;  Location: [place];  Service: Vascular;  Laterality: Right;  . AMPUTATION Left 04/08/2017   Procedure: AMPUTATION ABOVE KNEE LEFT;   Surgeon: Waynetta Sandy, MD;  Location: [place];  Service: Vascular;  Laterality: Left;  . ARTERIOVENOUS GRAFT PLACEMENT Left   . BACK SURGERY    . CORONARY ARTERY BYPASS GRAFT    . DEBRIDMENT OF DECUBITUS ULCER N/A 04/08/2017   Procedure: DEBRIDMENT OF Sacral Wound;  Surgeon: Fanny Skates, MD;  Location: [place];  Service: General;  Laterality: N/A;  . IR THROMBECTOMY AV FISTULA W/THROMBOLYSIS/PTA/STENT INC/SHUNT/IMG LT Left 05/06/2017  . IR US GUIDE VASC ACCESS LEFT  05/06/2017  . LAPAROSCOPIC COLON RESECTION     growth  . PERIPHERAL VASCULAR BALLOON ANGIOPLASTY Right 02/06/2017   Procedure: PERIPHERAL VASCULAR BALLOON ANGIOPLASTY;  Surgeon: Waynetta Sandy, MD;  Location: [place] CV LAB;  Service: Cardiovascular;  Laterality: Right;  Anterior tibial  . REVISION OF ARTERIOVENOUS GORETEX GRAFT Left 11/16/2016   Procedure: REVISION OF ARTERIOVENOUS GORETEX GRAFT;  Surgeon: Angelia Mould, MD;  Location: [place];  Service: Vascular;  Laterality: Left;  . REVISON OF ARTERIOVENOUS FISTULA Left 09/11/2013   Procedure: REVISON OF ARTERIOVENOUS FISTULA-LEFT ARM; POSSIBLE VENOPLASTY WITH INTEROPERATIVE FISTULOGRAM;  Surgeon: Angelia Mould, MD;  Location: [facility] OR;  Service: Vascular;  Laterality: Left;    Family History  Problem Relation Age of Onset  . Cancer Father   . Diabetes Sister     Social History:  reports that  has never smoked. His smokeless tobacco use includes snuff. He reports that he does not drink  alcohol or use drugs.  Allergies:  Allergies  Allergen Reactions  . Iohexol Swelling and Rash  . Zanaflex [Tizanidine] Other (See Comments)    Shut kidneys down Unable to walk or void  . Ezetimibe-Simvastatin Other (See Comments)    Elevated liver enzymes  . Fluvastatin Other (See Comments)    Elevated liver enzymes  . Simvastatin Other (See Comments)    Elevated liver enzymes    Medications: I have reviewed the patient's current  medications. Prior to Admission:  (Not in a hospital admission) , Calcitriol .25 mcg. Micera 150 mcg iv q 2 wk Parsabiv 5 mcg q HD  Results for orders placed or performed during the hospital encounter of 06/07/17 (from the past 48 hour(s))  Comprehensive metabolic panel     Status: Abnormal   Collection Time: 06/07/17  1:27 PM  Result Value Ref Range   Sodium 127 (L) 135 - 145 mmol/L   Potassium 6.0 (H) 3.5 - 5.1 mmol/L   Chloride 93 (L) 101 - 111 mmol/L   CO2 21 (L) 22 - 32 mmol/L   Glucose, Bld 73 65 - 99 mg/dL   BUN 43 (H) 6 - 20 mg/dL   Creatinine, Ser 6.96 (H) 0.61 - 1.24 mg/dL   Calcium 9.4 8.9 - 10.3 mg/dL   Total Protein 5.5 (L) 6.5 - 8.1 g/dL   Albumin 2.1 (L) 3.5 - 5.0 g/dL   AST 23 15 - 41 U/L   ALT 15 (L) 17 - 63 U/L   Alkaline Phosphatase 112 38 - 126 U/L   Total Bilirubin 0.7 0.3 - 1.2 mg/dL   GFR calc non Af Amer 7 (L) >60 mL/min   GFR calc Af Amer 8 (L) >60 mL/min    Comment: (NOTE) The eGFR has been calculated using the CKD EPI equation. This calculation has not been validated in all clinical situations. eGFR's persistently <60 mL/min signify possible Chronic Kidney Disease.    Anion gap 13 5 - 15  CBC with Differential     Status: Abnormal   Collection Time: 06/07/17  1:27 PM  Result Value Ref Range   WBC 8.4 4.0 - 10.5 K/uL   RBC 3.93 (L) 4.22 - 5.81 MIL/uL   Hemoglobin 10.5 (L) 13.0 - 17.0 g/dL   HCT 34.6 (L) 39.0 - 52.0 %   MCV 88.0 78.0 - 100.0 fL   MCH 26.7 26.0 - 34.0 pg   MCHC 30.3 30.0 - 36.0 g/dL   RDW 17.0 (H) 11.5 - 15.5 %   Platelets 274 150 - 400 K/uL    Comment: REPEATED TO VERIFY PLATELET COUNT CONFIRMED BY SMEAR    Neutrophils Relative % 75 %   Neutro Abs 6.3 1.7 - 7.7 K/uL   Lymphocytes Relative 13 %   Lymphs Abs 1.1 0.7 - 4.0 K/uL   Monocytes Relative 7 %   Monocytes Absolute 0.6 0.1 - 1.0 K/uL   Eosinophils Relative 4 %   Eosinophils Absolute 0.3 0.0 - 0.7 K/uL   Basophils Relative 1 %   Basophils Absolute 0.1 0.0 - 0.1  K/uL    Dg Chest Port 1 View  Result Date: 06/07/2017 CLINICAL DATA:  Short of breath EXAM: PORTABLE CHEST 1 VIEW COMPARISON:  04/01/2017 FINDINGS: Borderline cardiomegaly. Low volumes. Bibasilar atelectasis. Left jugular tunneled dialysis catheter placed with its tip at the cavoatrial junction. No pneumothorax IMPRESSION: Bibasilar atelectasis Left jugular dialysis catheter with its tip at the cavoatrial junction. No pneumothorax. Electronically Signed   By: Marybelle Killings  M.D.   On: 06/07/2017 13:29    ROS Blood pressure (!) 126/54, resp. rate 12, SpO2 98 %. Physical Exam Physical Examination: General appearance - obese, NAD  Mental status - OX1 but can answer questions Mouth - mucous membranes moist, pharynx normal without lesions Neck - adenopathy noted PCL Lymphatics - posterior cervical nodes Chest - rhonchi noted bibasilar Heart - normal rate, regular rhythm, normal S1, S2, , systolic murmur Gr 2//6 at apex Abdomen - obese, pos bs, soft Back exam - see below Extremities - [facility] no dressing, Bilat AKAs Skin - 10 in deep, stage 4 decub over sacrum  Assessment/Plan: 1 Dementia 2 ESRD: will not plan HD,  Have discussed with patient, wife. Multiple nonreversible serious problems with progressive downhill course, and his QOL in very poor.  Wife agrees    P comfort measures . Pall care. Wife will come tomorrow  [name] 06/07/2017, 5:41 PM

## 2017-06-07 NOTE — Progress Notes (Signed)
Pharmacy Antibiotic Note  Ian BlakeWarren J Turner is a 82 y.o. male admitted on 06/07/2017 with wound infection.  Pharmacy has been consulted for vancomycin and Zosyn dosing. Pt is a ESRD on dialysis MWF who presented to ED after missed dialysis (last dialysis was Monday 1/14). WBC WNL.  Plan: Vancomycin 1750mg  IV x1 F/u dialysis plans for further vancomycin dose Zosyn 3.375gm IV x1 (30 minute infusion) Zosyn 3.375gm IV q12h (4 hour infusion) F/u renal recs, pre-HD vanc level, clinical resolution  No data recorded.  Recent Labs  Lab 06/07/17 1327  WBC 8.4  CREATININE 6.96*    CrCl cannot be calculated (Unknown ideal weight.).    Allergies  Allergen Reactions  . Iohexol Swelling and Rash  . Zanaflex [Tizanidine] Other (See Comments)    Shut kidneys down Unable to walk or void  . Ezetimibe-Simvastatin Other (See Comments)    Elevated liver enzymes  . Fluvastatin Other (See Comments)    Elevated liver enzymes  . Simvastatin Other (See Comments)    Elevated liver enzymes    Antimicrobials this admission: Vanc 1/18 >>  Zosyn 1/18 >>   Dose adjustments this admission: None  Microbiology results: Pending  Thank you for allowing pharmacy to be a part of this patient's care.  Theoplis Garciagarcia 06/07/2017 4:15 PM

## 2017-06-07 NOTE — ED Notes (Signed)
Patient denies pain and is resting comfortably.  

## 2017-06-07 NOTE — H&P (Signed)
History and Physical    [name] ZCH:[id number] DOB: 1936-02-21 DOA: 06/07/2017  PCP: Townsend Roger, MD  Patient coming from: SNF  I have personally briefly reviewed patient's old medical records in [name]; wound pain.   HPI: [name] is a 82 y.o. male with medical history significant of ESRD on HD, has missed treatments for last two times. He was refer by SNF for evaluation for HD. Patient per report has refused HD. Per ED records, family convince patient to come to hospital for hospice evaluation. I called wife and she was not aware of that. She is willing to speak with palliative for goals of care. She told me if he refused something don't make him do it. Regarding HD she was not sure what to do. I offer nephrology consult and palliative care consult.   Patient is having pain in his decubitus wound. His wound is foul smelling, significant pus drainage. He is confused. He doesn't know that he is in the hospital.     ED Course: found to have K at 6, sodium 127, BUN 43, cr 6.9, hb 10, EKG no st changes. No Peak T wave. Chest x ray; bibasilar atelectasis.   Review of Systems: As per HPI otherwise 10 point review of systems negative.    Past Medical History:  Diagnosis Date  . Anginal pain ([name])    before CABG  . Arthritis   . CHF (congestive heart failure) ([name])   . Constipation   . Coronary artery disease   . Diabetes mellitus   . ESRD (end stage renal disease) ([name])    TuesThurs Sat [place]  . Hypertension   . Myocardial infarction ([name])   . Pneumonia    2 years ago    Past Surgical History:  Procedure Laterality Date  . A/V FISTULAGRAM Left 10/31/2016   Procedure: A/V Fistulagram;  Surgeon: Waynetta Sandy, MD;  Location: [facility];  Service: Cardiovascular;  Laterality: Left;  . ABDOMINAL AORTOGRAM W/LOWER EXTREMITY N/A 01/03/2017   Procedure: ABDOMINAL AORTOGRAM W/LOWER EXTREMITY;  Surgeon: Conrad Yakima, MD;   Location: [facility];  Service: Cardiovascular;  Laterality: N/A;  . ABDOMINAL AORTOGRAM W/LOWER EXTREMITY N/A 02/06/2017   Procedure: ABDOMINAL AORTOGRAM W/LOWER EXTREMITY;  Surgeon: Waynetta Sandy, MD;  Location: [facility];  Service: Cardiovascular;  Laterality: N/A;  . AMPUTATION Right 02/14/2017   Procedure: RIGHT ABOVE KNEE AMPUTATION;  Surgeon: Waynetta Sandy, MD;  Location: [facility];  Service: Vascular;  Laterality: Right;  . AMPUTATION Left 04/08/2017   Procedure: AMPUTATION ABOVE KNEE LEFT;  Surgeon: Waynetta Sandy, MD;  Location: [facility];  Service: Vascular;  Laterality: Left;  . ARTERIOVENOUS GRAFT PLACEMENT Left   . BACK SURGERY    . CORONARY ARTERY BYPASS GRAFT    . DEBRIDMENT OF DECUBITUS ULCER N/A 04/08/2017   Procedure: DEBRIDMENT OF Sacral Wound;  Surgeon: Ian Skates, MD;  Location: [facility];  Service: General;  Laterality: N/A;  . IR THROMBECTOMY AV FISTULA W/THROMBOLYSIS/PTA/STENT INC/SHUNT/IMG LT Left 05/06/2017  . IR US GUIDE VASC ACCESS LEFT  05/06/2017  . LAPAROSCOPIC COLON RESECTION     growth  . PERIPHERAL VASCULAR BALLOON ANGIOPLASTY Right 02/06/2017   Procedure: PERIPHERAL VASCULAR BALLOON ANGIOPLASTY;  Surgeon: Waynetta Sandy, MD;  Location: [facility];  Service: Cardiovascular;  Laterality: Right;  Anterior tibial  . REVISION OF ARTERIOVENOUS GORETEX GRAFT Left 11/16/2016   Procedure: REVISION OF ARTERIOVENOUS  GORETEX GRAFT;  Surgeon: Angelia Mould, MD;  Location: [facility];  Service: Vascular;  Laterality: Left;  . REVISON OF ARTERIOVENOUS FISTULA Left 09/11/2013   Procedure: REVISON OF ARTERIOVENOUS FISTULA-LEFT ARM; POSSIBLE VENOPLASTY WITH INTEROPERATIVE FISTULOGRAM;  Surgeon: Angelia Mould, MD;  Location: [facility];  Service: Vascular;  Laterality: Left;     reports that  has never smoked. His smokeless tobacco use includes snuff. He reports that he does not drink alcohol or use  drugs.  Allergies  Allergen Reactions  . Iohexol Swelling and Rash  . Zanaflex [Tizanidine] Other (See Comments)    Shut kidneys down Unable to walk or void  . Ezetimibe-Simvastatin Other (See Comments)    Elevated liver enzymes  . Fluvastatin Other (See Comments)    Elevated liver enzymes  . Simvastatin Other (See Comments)    Elevated liver enzymes    Family History  Problem Relation Age of Onset  . Cancer Father   . Diabetes Sister     Prior to Admission medications   Medication Sig Start Date End Date Taking? Authorizing Provider  Glycerin, Adult, 2.1 g SUPP Place 1 suppository rectally daily as needed for moderate constipation. Patient not taking: Reported on 04/01/2017 02/19/17   Ian Mocha, MD  morphine 2 MG/ML injection Inject 0.5-1 mLs (1-2 mg total) into the vein every 2 (two) hours as needed. 04/11/17   Ian Sells, MD  oxyCODONE (OXY IR/ROXICODONE) 5 MG immediate release tablet Take 1-2 tablets (5-10 mg total) by mouth every 4 (four) hours as needed for moderate pain. 04/11/17   Ian Sells, MD  povidone-iodine (BETADINE) 10 % external solution Apply topically daily. Patient not taking: Reported on 04/01/2017 02/20/17   Ian Mocha, MD  sevelamer carbonate (RENVELA) 800 MG tablet Take 1 tablet (800 mg total) by mouth 3 (three) times daily with meals. 04/11/17   Ian Sells, MD    Physical Exam: Vitals:   06/07/17 1315 06/07/17 1330 06/07/17 1345 06/07/17 1430  BP: (!) 109/59 (!) 111/53 (!) 89/59 134/66  Resp: 12 12 (!) 7 12    Constitutional: NAD, confused.  Vitals:   06/07/17 1315 06/07/17 1330 06/07/17 1345 06/07/17 1430  BP: (!) 109/59 (!) 111/53 (!) 89/59 134/66  Resp: 12 12 (!) 7 12   Eyes: PERRL, lids and conjunctivae normal ENMT: Mucous membranes are moist. Posterior pharynx clear of any exudate or lesions.Normal dentition.  Neck: normal, supple, no masses, no thyromegaly Respiratory: clear to auscultation  bilaterally, no wheezing, no crackles. Normal respiratory effort. No accessory muscle use.  Cardiovascular: Regular rate and rhythm, no murmurs / rubs / gallops. No extremity edema. 2+ pedal pulses. No carotid bruits.  Abdomen: no tenderness, no masses palpated. No hepatosplenomegaly. Bowel sounds positive.  Musculoskeletal: BL AKA Skin: stage IV decubitus wound, purulent drainage.  Neurologic: alert, follows some command.     Labs on Admission: I have personally reviewed following labs and imaging studies  CBC: Recent Labs  Lab 06/07/17 1327  WBC 8.4  NEUTROABS 6.3  HGB 10.5*  HCT 34.6*  MCV 88.0  PLT 253   Basic Metabolic Panel: Recent Labs  Lab 06/07/17 1327  NA 127*  K 6.0*  CL 93*  CO2 21*  GLUCOSE 73  BUN 43*  CREATININE 6.96*  CALCIUM 9.4   GFR: CrCl cannot be calculated (Unknown ideal weight.). Liver Function Tests: Recent Labs  Lab 06/07/17 1327  AST 23  ALT 15*  ALKPHOS 112  BILITOT 0.7  PROT 5.5*  ALBUMIN  2.1*   No results for input(s): LIPASE, AMYLASE in the last 168 hours. No results for input(s): AMMONIA in the last 168 hours. Coagulation Profile: No results for input(s): INR, PROTIME in the last 168 hours. Cardiac Enzymes: No results for input(s): CKTOTAL, CKMB, CKMBINDEX, TROPONINI in the last 168 hours. BNP (last 3 results) No results for input(s): PROBNP in the last 8760 hours. HbA1C: No results for input(s): HGBA1C in the last 72 hours. CBG: No results for input(s): GLUCAP in the last 168 hours. Lipid Profile: No results for input(s): CHOL, HDL, LDLCALC, TRIG, CHOLHDL, LDLDIRECT in the last 72 hours. Thyroid Function Tests: No results for input(s): TSH, T4TOTAL, FREET4, T3FREE, THYROIDAB in the last 72 hours. Anemia Panel: No results for input(s): VITAMINB12, FOLATE, FERRITIN, TIBC, IRON, RETICCTPCT in the last 72 hours. Urine analysis:    Component Value Date/Time   COLORURINE YELLOW 12/26/2016 0024   APPEARANCEUR CLOUDY (A)  12/26/2016 0024   LABSPEC 1.016 12/26/2016 0024   PHURINE 8.0 12/26/2016 0024   GLUCOSEU NEGATIVE 12/26/2016 0024   HGBUR SMALL (A) 12/26/2016 0024   BILIRUBINUR NEGATIVE 12/26/2016 0024   KETONESUR NEGATIVE 12/26/2016 0024   PROTEINUR 100 (A) 12/26/2016 0024   UROBILINOGEN 0.2 03/08/2011 0253   NITRITE NEGATIVE 12/26/2016 0024   LEUKOCYTESUR LARGE (A) 12/26/2016 0024    Radiological Exams on Admission: Dg Chest Port 1 View  Result Date: 06/07/2017 CLINICAL DATA:  Short of breath EXAM: PORTABLE CHEST 1 VIEW COMPARISON:  04/01/2017 FINDINGS: Borderline cardiomegaly. Low volumes. Bibasilar atelectasis. Left jugular tunneled dialysis catheter placed with its tip at the cavoatrial junction. No pneumothorax IMPRESSION: Bibasilar atelectasis Left jugular dialysis catheter with its tip at the cavoatrial junction. No pneumothorax. Electronically Signed   By: Marybelle Killings M.D.   On: 06/07/2017 13:29    EKG: Independently reviewed. Sinus no Peak T wave.   Assessment/Plan Active Problems:   End stage renal disease (HCC)   Hyperkalemia   Diabetes mellitus with complication (HCC)   Dementia   DNR (do not resuscitate) discussion   Decubitus ulcer of buttock, stage 4 ([place])  1-Hyperkalemia;  Will order kayexalate, and nebulizer.  Repeat B-met tonight.  Nephrology consulted, for evaluation of HD.   2-ESRD; missed last two section of HD.  Nephrology consulted.  Palliative care consulted for  goals of care.   3-History of DM; SSI. Monitor cbg.   4-Stage IV decubitus wound buttocks. Infected. IV vancomycin and zosyn.  Wound care consulted.   5-Dementia; patient is confused   DVT prophylaxis: heparin  Code Status: DNR Family Communication: wife over the phone Disposition Plan: to be determine Consults called: Nephrology Admission status: inpatient.   Elmarie Shiley MD [facility] Pager [phone]  If 7PM-7AM, please contact night-coverage [email or web address] Password  [facility]  06/07/2017, 4:45 PM

## 2017-06-08 ENCOUNTER — Other Ambulatory Visit: Payer: Self-pay

## 2017-06-08 DIAGNOSIS — Z515 Encounter for palliative care: Secondary | ICD-10-CM

## 2017-06-08 DIAGNOSIS — F0390 Unspecified dementia without behavioral disturbance: Secondary | ICD-10-CM

## 2017-06-08 DIAGNOSIS — Z7189 Other specified counseling: Secondary | ICD-10-CM

## 2017-06-08 LAB — BASIC METABOLIC PANEL
ANION GAP: 12 (ref 5–15)
Anion gap: 12 (ref 5–15)
BUN: 45 mg/dL — AB (ref 6–20)
BUN: 47 mg/dL — ABNORMAL HIGH (ref 6–20)
CHLORIDE: 92 mmol/L — AB (ref 101–111)
CO2: 24 mmol/L (ref 22–32)
CO2: 22 mmol/L (ref 22–32)
CREATININE: 7.6 mg/dL — AB (ref 0.61–1.24)
Calcium: 9 mg/dL (ref 8.9–10.3)
Calcium: 8.5 mg/dL — ABNORMAL LOW (ref 8.9–10.3)
Chloride: 91 mmol/L — ABNORMAL LOW (ref 101–111)
Creatinine, Ser: 7.44 mg/dL — ABNORMAL HIGH (ref 0.61–1.24)
GFR calc non Af Amer: 6 mL/min — ABNORMAL LOW (ref >60)
GFR, EST AFRICAN AMERICAN: 7 mL/min — AB (ref >60)
GFR, EST AFRICAN AMERICAN: 7 mL/min — AB (ref >60)
GFR, EST NON AFRICAN AMERICAN: 6 mL/min — AB (ref >60)
Glucose, Bld: 68 mg/dL (ref 65–99)
Glucose, Bld: 46 mg/dL — ABNORMAL LOW (ref 65–99)
POTASSIUM: 5.8 mmol/L — AB (ref 3.5–5.1)
Potassium: 6.4 mmol/L (ref 3.5–5.1)
SODIUM: 127 mmol/L — AB (ref 135–145)
SODIUM: 126 mmol/L — AB (ref 135–145)

## 2017-06-08 LAB — CBC
HCT: 28.1 % — ABNORMAL LOW (ref 39.0–52.0)
HEMOGLOBIN: 8.7 g/dL — AB (ref 13.0–17.0)
MCH: 27.1 pg (ref 26.0–34.0)
MCHC: 31 g/dL (ref 30.0–36.0)
MCV: 87.5 fL (ref 78.0–100.0)
Platelets: 256 10*3/uL (ref 150–400)
RBC: 3.21 MIL/uL — ABNORMAL LOW (ref 4.22–5.81)
RDW: 17.1 % — AB (ref 11.5–15.5)
WBC: 7.6 10*3/uL (ref 4.0–10.5)

## 2017-06-08 LAB — MRSA PCR SCREENING: MRSA by PCR: NEGATIVE

## 2017-06-08 LAB — GLUCOSE, CAPILLARY: GLUCOSE-CAPILLARY: 70 mg/dL (ref 65–99)

## 2017-06-08 MED ORDER — GLYCOPYRROLATE 0.2 MG/ML IJ SOLN
0.2000 mg | INTRAMUSCULAR | Status: DC | PRN
  Filled 2017-06-08: qty 1

## 2017-06-08 MED ORDER — OCUVITE-LUTEIN PO CAPS
1.0000 | ORAL_CAPSULE | Freq: Every day | ORAL | Status: DC
  Filled 2017-06-08: qty 1

## 2017-06-08 MED ORDER — NEPRO/CARBSTEADY PO LIQD
237.0000 mL | Freq: Three times a day (TID) | ORAL | Status: DC
  Administered 2017-06-08 (×3): 237 mL via ORAL
  Filled 2017-06-08 (×8): qty 237

## 2017-06-08 MED ORDER — GLYCOPYRROLATE 1 MG PO TABS
1.0000 mg | ORAL_TABLET | ORAL | Status: DC | PRN
  Filled 2017-06-08: qty 1

## 2017-06-08 MED ORDER — POLYVINYL ALCOHOL 1.4 % OP SOLN
1.0000 [drp] | Freq: Four times a day (QID) | OPHTHALMIC | Status: DC | PRN
  Filled 2017-06-08: qty 15

## 2017-06-08 MED ORDER — SODIUM POLYSTYRENE SULFONATE 15 GM/60ML PO SUSP
45.0000 g | Freq: Once | ORAL | Status: AC
  Administered 2017-06-08: 45 g via ORAL
  Filled 2017-06-08: qty 180

## 2017-06-08 MED ORDER — BIOTENE DRY MOUTH MT LIQD: 15.0000 mL | OROMUCOSAL | Status: DC | PRN

## 2017-06-08 MED ORDER — DEXTROSE 50 % IV SOLN
1.0000 | Freq: Once | INTRAVENOUS | Status: AC
  Administered 2017-06-08: 25 mL via INTRAVENOUS

## 2017-06-08 MED ORDER — OXYCODONE HCL 20 MG/ML PO CONC: 5.0000 mg | ORAL | Status: DC | PRN

## 2017-06-08 MED ORDER — HALOPERIDOL LACTATE 5 MG/ML IJ SOLN: 0.5000 mg | INTRAMUSCULAR | Status: DC | PRN

## 2017-06-08 MED ORDER — HALOPERIDOL 0.5 MG PO TABS
0.5000 mg | ORAL_TABLET | ORAL | Status: DC | PRN
Start: 2017-06-08 — End: 2017-06-09
  Filled 2017-06-08: qty 1

## 2017-06-08 MED ORDER — DEXTROSE 50 % IV SOLN
INTRAVENOUS | Status: AC
  Filled 2017-06-08: qty 50

## 2017-06-08 MED ORDER — HALOPERIDOL LACTATE 2 MG/ML PO CONC
0.5000 mg | ORAL | Status: DC | PRN
  Filled 2017-06-08: qty 0.3

## 2017-06-08 MED ORDER — HYDROMORPHONE HCL 1 MG/ML IJ SOLN: 0.5000 mg | INTRAMUSCULAR | Status: DC | PRN

## 2017-06-08 MED ORDER — VITAMIN C 500 MG PO TABS: 500.0000 mg | ORAL_TABLET | Freq: Two times a day (BID) | ORAL | Status: DC

## 2017-06-08 NOTE — Progress Notes (Signed)
Received call from lab of potassium 6.4. Dr. Carmell Austriaegaldo messaged with results. Will continue to monitor. Bess KindsGWALTNEY, Larysa Pall B, RN

## 2017-06-08 NOTE — Progress Notes (Signed)
Earlier in the night, the patient had refused to be stuck for labs.  After talking with him, he did agree and [facility] was made aware the results were available.  No additional orders at that time.  Will continue to monitor patient.  Bernie CoveyKimberly Shani Fitch RN-BC, [employer]

## 2017-06-08 NOTE — Progress Notes (Signed)
Patient ID: [name], male   DOB: 02/04/1936, 82 y.o.   MRN: [id number] Patient is comfortable.  Confused.  Assume that will be going to Hospice so will s/o at this time.

## 2017-06-08 NOTE — Consult Note (Signed)
Consultation Note Date: 06/08/2017   Patient Name: [name]  DOB: 07-23-1935  MRN: [id number]  Age / Sex: 82 y.o., male  PCP: Ian Roger, MD Referring Physician: Elmarie Shiley, MD  Reason for Consultation: Establishing goals of care  HPI/Patient Profile: 81 y.o. male  with past medical history of ESRD on dialysis, dementia, and DM admitted on 06/07/2017 following refusal of dialysis this week.  He was found to have hyperkalemia (now s/p kayexalate) and infected stage IV decubitus ulcer.  Nephrology has seen him and spoken to wife and no plan for further dialysis.  Palliative consulted for goals of care.   Clinical Assessment and Goals of Care: I met today with [name].  He is alert but does not have full insight into his medical condition.  He does say "No. No." when asked about dialysis.  I called and spoke with his wife via phone.  She was planning to come and see him, however, she has been unable to get a ride to the hospital (she lives over 1/2 hour away in [place]).  She reports that his family has always been the most important thing to him.  She reports having the chance to speak with [name] and relayed their conversation and decision to stop dialysis.   We discussed clinical course as well as wishes moving forward in regard to care moving forward in light of decision to forgo dialysis.  We discussed difference between a aggressive medical intervention path and a palliative, comfort focused care path.  Values and goals of care important to patient and family were attempted to be elicited.  Wife is clear that her hope is for comfort and dignity as he approaches end of life.  She is hopeful that he can be transferred to residential facility in [place] as she will be able to get there to see him and cannot find a ride to [place] at this point.  Questions and concerns addressed.    PMT will continue to support holistically.  SUMMARY OF RECOMMENDATIONS   - DNR/DNI - Goals is for comfort.  D/c abx, labs, and other interventions not specifically related to comfort. -  Wife is in [place] and cannot get a ride to [place] to be with him.  He has painful sacral wound that requires pain management and comfort oriented wound care.  He is no longer receiving dialysis and his prognosis is limited to likely 7-10 days at best.  He is at high risk for continued development of acute symptoms requiring aggressive management.  He would be well served by placement at residential hospice.  As his wife cannot get someone to bring her from [place], he would be best served by transfer to residential facility in [place] if this can be arranged.  Appreciate SW assistance.  Code Status/Advance Care Planning:  DNR   Symptom Management:  Pain or Shortness of breath: Dilaudid IV or oxycodone oral solution as needed Agitation: Haldol as needed Nausea: Zofran or haldol as needed Secretions: Robinul as  needed Anxiety: Ativan as needed  Palliative Prophylaxis:   Bowel Regimen, Delirium Protocol and Frequent Pain Assessment  Additional Recommendations (Limitations, Scope, Preferences):  Full Comfort Care  Psycho-social/Spiritual:   Desire for further Chaplaincy support:Did not address today  Additional Recommendations: Education on Hospice  Prognosis:   < 2 weeks- He has infection related to sacral wound, ESRD with no plan for further dialysis, and has been hyperkalemic and hypoglycemic.  His prognosis is less than 2 weeks with average life expectancy of 7-10 days from stopping of dialysis.  With his other comorbid conditions, his is at high risk for acute decompensation and death at any point.  Discharge Planning: Hospice facility - Wife is in [place] and cannot get a ride to [place] to be with him.  He has painful sacral wound that requires pain management and comfort  oriented wound care.  He is no longer receiving dialysis and his prognosis is limited to likely 7-10 days at best.  He is at high risk for continued development of acute symptoms requiring aggressive management.  He would be well served by placement at residential hospice.  As his wife cannot get someone to bring her from [place], he would be best served by transfer to residential facility in [place] if this can be arranged.  Appreciate SW assistance.     Primary Diagnoses: Present on Admission: . Hyperkalemia . End stage renal disease ([place]) . Diabetes mellitus with complication ([place]) . Dementia   I have reviewed the medical record, interviewed the patient and family, and examined the patient. The following aspects are pertinent.  Past Medical History:  Diagnosis Date  . Anginal pain ([place])    before CABG  . Arthritis   . CHF (congestive heart failure) ([place])   . Constipation   . Coronary artery disease   . Diabetes mellitus   . ESRD (end stage renal disease) ([place])    TuesThurs Sat [place]  . Hypertension   . Myocardial infarction ([place])   . Pneumonia    2 years ago   Social History   Socioeconomic History  . Marital status: Married    Spouse name: None  . Number of children: None  . Years of education: None  . Highest education level: None  Social Needs  . Financial resource strain: None  . Food insecurity - worry: None  . Food insecurity - inability: None  . Transportation needs - medical: None  . Transportation needs - non-medical: None  Occupational History  . None  Tobacco Use  . Smoking status: Never Smoker  . Smokeless tobacco: Current User    Types: Snuff  Substance and Sexual Activity  . Alcohol use: No    Alcohol/week: 0.0 oz  . Drug use: No  . Sexual activity: None  Other Topics Concern  . None  Social History Narrative  . None   Family History  Problem Relation Age of Onset  . Cancer Father   . Diabetes Sister    Scheduled Meds: .  dextrose      . feeding supplement (NEPRO CARB STEADY)  237 mL Oral TID BM  . sodium chloride flush  3 mL Intravenous Q12H   Continuous Infusions: . sodium chloride     PRN Meds:.sodium chloride, acetaminophen **OR** acetaminophen, antiseptic oral rinse, glycopyrrolate **OR** glycopyrrolate **OR** glycopyrrolate, haloperidol **OR** haloperidol **OR** haloperidol lactate, HYDROmorphone (DILAUDID) injection, ondansetron **OR** ondansetron (ZOFRAN) IV, oxyCODONE **OR** oxyCODONE, polyvinyl alcohol, sodium chloride flush Medications Prior to Admission:  Prior to Admission medications  Medication Sig Start Date End Date Taking? Authorizing Provider  B Complex-C-Folic Acid (RENA-VITE PO) Take 1 tablet by mouth 2 (two) times daily.   Yes [provider]  calcium acetate (PHOSLO) 667 MG capsule Take 1,344 mg by mouth 3 (three) times daily with meals.   Yes [provider]  DULoxetine (CYMBALTA) 30 MG capsule Take 30 mg by mouth daily.   Yes [provider]  gabapentin (NEURONTIN) 100 MG capsule Take 200 mg by mouth at bedtime.   Yes [provider]  Methoxy PEG-Epoetin Beta (MIRCERA) 150 MCG/0.3ML SOSY Inject 150 mcg as directed every 14 (fourteen) days. Given at the dialysis center on Wednesday   Yes [provider]  oxyCODONE (OXY IR/ROXICODONE) 5 MG immediate release tablet Take 1-2 tablets (5-10 mg total) by mouth every 4 (four) hours as needed for moderate pain. Patient taking differently: Take 10 mg by mouth every 6 (six) hours. For Pain 04/11/17  Yes Nita Sells, MD  Oxycodone HCl 10 MG TABS Take 10 mg by mouth every 4 (four) hours as needed.   Yes [provider]   Allergies  Allergen Reactions  . Iohexol Swelling and Rash  . Zanaflex [Tizanidine] Other (See Comments)    Shut kidneys down Unable to walk or void  . Ezetimibe-Simvastatin Other (See Comments)    Elevated liver enzymes  . Fluvastatin Other (See Comments)     Elevated liver enzymes  . Simvastatin Other (See Comments)    Elevated liver enzymes   Review of Systems  Physical Exam  General: Sleeping but arouses easily.  Confused.  Heart: Regular rate and rhythm. No murmur appreciated. Lungs: Good air movement, clear Abdomen: Soft, nontender, nondistended, positive bowel sounds.  Ext: B/L AKA Skin: Warm and dry.  Sacal wound not examined  Vital Signs: BP (!) 111/52 (BP Location: Right Arm)   Pulse 74   Temp 98.5 F (36.9 C) (Oral)   Resp 16   Ht _0  (1.778 m)   Wt 74.4 kg (164 lb 0.4 oz)   SpO2 99%   BMI 23.53 kg/m  Pain Assessment: No/denies pain   Pain Score: 7    SpO2: SpO2: 99 % O2 Device:SpO2: 99 % O2 Flow Rate: .   IO: Intake/output summary:   Intake/Output Summary (Last 24 hours) at 06/08/2017 1549 Last data filed at 06/08/2017 1300 Gross per 24 hour  Intake 580 ml  Output 0 ml  Net 580 ml    LBM: Last BM Date: 06/08/17 Baseline Weight: Weight: 74.4 kg (164 lb 0.4 oz) Most recent weight: Weight: 74.4 kg (164 lb 0.4 oz)     Palliative Assessment/Data:   Flowsheet Rows     Most Recent Value  Intake Tab  Referral Department  Hospitalist  Unit at Time of Referral  Med/Surg Unit  Palliative Care Primary Diagnosis  Nephrology  Date Notified  06/08/17  Palliative Care Type  New Palliative care  Reason for referral  Clarify Goals of Care, Counsel Regarding Hospice, End of Life Care Assistance  Date of Admission  06/07/17  Date first seen by Palliative Care  06/08/17  # of days Palliative referral response time  0 Day(s)  # of days IP prior to Palliative referral  1  Clinical Assessment  Palliative Performance Scale Score  20%  Pain Max last 24 hours  Not able to report  Pain Min Last 24 hours  Not able to report  Psychosocial & Spiritual Assessment  Palliative Care Outcomes  Patient/Family meeting held?  Yes  Who was at the meeting?  Patient (confused) and wife via phone  Palliative Care Outcomes   Improved pain interventions, Improved non-pain symptom therapy, Clarified goals of care, Provided end of life care assistance, Provided psychosocial or spiritual support, Transitioned to hospice      Time In: 1430 Time Out: 1550 Time Total: 70 Greater than 50%  of this time was spent counseling and coordinating care related to the above assessment and plan.  Signed by: Micheline Rough, MD   Please contact Palliative Medicine Team phone at [phone] for questions and concerns.  For individual provider: See Shea Evans

## 2017-06-08 NOTE — Progress Notes (Signed)
PROGRESS NOTE    [name]  ZOX:[id number] DOB: [birth year] DOA: 06/07/2017 PCP: Crist Fat, MD    Brief Narrative: : [name] is a 82 y.o. male with medical history significant of ESRD on HD, has missed treatments for last two times. He was refer by SNF for evaluation for HD. Patient per report has refused HD. Per ED records, family convince patient to come to hospital for hospice evaluation. I called wife and she was not aware of that. She is willing to speak with palliative for goals of care. She told me if he refused something don't make him do it. Regarding HD she was not sure what to do. I offer nephrology consult and palliative care consult.   Patient is having pain in his decubitus wound. His wound is foul smelling, significant pus drainage. He is confused. He doesn't know that he is in the hospital.      Assessment & Plan:   Active Problems:   End stage renal disease (HCC)   Hyperkalemia   Diabetes mellitus with complication (HCC)   Dementia   DNR (do not resuscitate) discussion   Decubitus ulcer of buttock, stage 4 (HCC)  1-ESRD; no further HD. Dr Deterding  discussed with patient and his wife.   2-Hyperkalemia; will repeat another dose of Kayexalate. Await palliative meeting.   3-hypoglycemia; amp D 50.   4-Stage IV decubitus wound buttocks. Infected. Continue with  IV vancomycin and zosyn until palliative care meeting.  Wound care consulted.   5-Dementia; patient is confused     DVT prophylaxis: scd  Code Status: DNR Family Communication: Discussed with Wife 1-18 Disposition Plan: to be determine   Consultants:   Nephrology  Palliative.    Procedures: none   Antimicrobials: vancomycin  Zosyn/    Subjective: He is alert, does not want HD.    Objective: Vitals:   06/08/17 0100 06/08/17 0627 06/08/17 1100 06/08/17 1130  BP:  (!) 114/57  (!) 111/52  Pulse:  100  74  Resp:  16    Temp:  98.4 F (36.9 C) 98.5 F (36.9 C)     TempSrc:  Oral Oral   SpO2:  99%    Weight:      Height: 5\' 10"  (1.778 m)       Intake/Output Summary (Last 24 hours) at 06/08/2017 1443 Last data filed at 06/08/2017 0600 Gross per 24 hour  Intake 100 ml  Output 0 ml  Net 100 ml   Filed Weights   06/07/17 2151  Weight: 74.4 kg (164 lb 0.4 oz)    Examination:  General exam: Appears calm and comfortable  Respiratory system: Clear to auscultation. Respiratory effort normal. Cardiovascular system: S1 & S2 heard, RRR. No JVD, murmurs, rubs, gallops or clicks. No pedal edema. Gastrointestinal system: Abdomen is nondistended, soft and nontender. No organomegaly or masses felt. Normal bowel sounds heard. Central nervous system: Alert.  Extremities: Bilateral AKA Skin: stage IV decubitus. Buttocks.    Data Reviewed: I have personally reviewed following labs and imaging studies  CBC: Recent Labs  Lab 06/07/17 1327 06/08/17 0239  WBC 8.4 7.6  NEUTROABS 6.3  --   HGB 10.5* 8.7*  HCT 34.6* 28.1*  MCV 88.0 87.5  PLT 274 256   Basic Metabolic Panel: Recent Labs  Lab 06/07/17 1327 06/08/17 0239 06/08/17 0953  NA 127* 127* 126*  K 6.0* 5.8* 6.4*  CL 93* 91* 92*  CO2 21* 24 22  GLUCOSE 73 68 46*  BUN 43* 45* 47*  CREATININE 6.96* 7.44* 7.60*  CALCIUM 9.4 9.0 8.5*   GFR: Estimated Creatinine Clearance: 8.1 mL/min (A) (by C-G formula based on SCr of 7.6 mg/dL (H)). Liver Function Tests: Recent Labs  Lab 06/07/17 1327  AST 23  ALT 15*  ALKPHOS 112  BILITOT 0.7  PROT 5.5*  ALBUMIN 2.1*   No results for input(s): LIPASE, AMYLASE in the last 168 hours. No results for input(s): AMMONIA in the last 168 hours. Coagulation Profile: No results for input(s): INR, PROTIME in the last 168 hours. Cardiac Enzymes: No results for input(s): CKTOTAL, CKMB, CKMBINDEX, TROPONINI in the last 168 hours. BNP (last 3 results) No results for input(s): PROBNP in the last 8760 hours. HbA1C: No results for input(s): HGBA1C in the  last 72 hours. CBG: Recent Labs  Lab 06/08/17 1204  GLUCAP 70   Lipid Profile: No results for input(s): CHOL, HDL, LDLCALC, TRIG, CHOLHDL, LDLDIRECT in the last 72 hours. Thyroid Function Tests: No results for input(s): TSH, T4TOTAL, FREET4, T3FREE, THYROIDAB in the last 72 hours. Anemia Panel: No results for input(s): VITAMINB12, FOLATE, FERRITIN, TIBC, IRON, RETICCTPCT in the last 72 hours. Sepsis Labs: No results for input(s): PROCALCITON, LATICACIDVEN in the last 168 hours.  Recent Results (from the past 240 hour(s))  MRSA PCR Screening     Status: None   Collection Time: 06/08/17 12:06 AM  Result Value Ref Range Status   MRSA by PCR NEGATIVE NEGATIVE Final    Comment:        The GeneXpert MRSA Assay (FDA approved for NASAL specimens only), is one component of a comprehensive MRSA colonization surveillance program. It is not intended to diagnose MRSA infection nor to guide or monitor treatment for MRSA infections.          Radiology Studies: Dg Chest Port 1 View  Result Date: 06/07/2017 CLINICAL DATA:  Short of breath EXAM: PORTABLE CHEST 1 VIEW COMPARISON:  04/01/2017 FINDINGS: Borderline cardiomegaly. Low volumes. Bibasilar atelectasis. Left jugular tunneled dialysis catheter placed with its tip at the cavoatrial junction. No pneumothorax IMPRESSION: Bibasilar atelectasis Left jugular dialysis catheter with its tip at the cavoatrial junction. No pneumothorax. Electronically Signed   By: Jolaine ClickArthur  Hoss M.D.   On: 06/07/2017 13:29        Scheduled Meds: . dextrose      . feeding supplement (NEPRO CARB STEADY)  237 mL Oral TID BM  . heparin  5,000 Units Subcutaneous Q8H  . multivitamin-lutein  1 capsule Oral Daily  . sevelamer carbonate  800 mg Oral TID WC  . sodium chloride flush  3 mL Intravenous Q12H  . vitamin C  500 mg Oral BID   Continuous Infusions: . sodium chloride    . piperacillin-tazobactam (ZOSYN)  IV Stopped (06/08/17 1041)     LOS: 1 day      Time spent: 35 minutes    Alba CoryBelkys A Regalado, MD [facility] Pager [phone]  If 7PM-7AM, please contact night-coverage [email or web address] Password [email or web address] 06/08/2017, 2:43 PM

## 2017-06-08 NOTE — Progress Notes (Signed)
New Admission Note:   Arrival Method:   Via stretcher from the ED Mental Orientation:  A & O to person and place Telemetry: 5M01 Assessment: Completed Skin:  See wound assessment IV:  Rt AC NSL Pain: Denies Tubes:  None Safety Measures: Safety Fall Prevention Plan has been given, discussed and signed Admission: Completed [address] [place] Orientation: Patient has been orientated to the room, unit and staff.  Family:  None at bedside  Only clothes arrived with patient from the nursing home.    Orders have been reviewed and implemented. Will continue to monitor the patient. Call light has been placed within reach and bed alarm has been activated.   Bernie CoveyKimberly Keagan Brislin RN- BC, [facility] Phone number: [phone]

## 2017-06-08 NOTE — Progress Notes (Signed)
Initial Nutrition Assessment  DOCUMENTATION CODES:   Obesity unspecified  INTERVENTION:   Nepro Shake po TID, each supplement provides 425 kcal and 19 grams protein  Magic cup TID with meals, each supplement provides 290 kcal and 9 grams of protein  Ocuvite daily for wound healing (provides zinc, vitamin A, vitamin C, Vitamin E, copper, and selenium)  Vitamin C '500mg'$  po BID  Consider liberalizing diet if oral intake not improving  NUTRITION DIAGNOSIS:   Increased nutrient needs related to wound healing, catabolic illness(ESRD on HD) as evidenced by increased estimated needs from protein.  GOAL:   Patient will meet greater than or equal to 90% of their needs  MONITOR:   PO intake, Supplement acceptance, Weight trends, Labs, I & O's, Skin  REASON FOR ASSESSMENT:   Malnutrition Screening Tool    ASSESSMENT:    82 yr male with ESRD on HD, DM, HTN, CAD, DCD, severe dementia, Anemia, HPTH, bilat LE amp with PVD, large sacral decub.    Pt with LIJ cath  Met with pt in room today. Pt is a poor historian but reports poor appetite and oral intake pta. Pt is currently eating <25% of meals in hospital. Pt reports that he does like supplements. Per chart, pt with 34lb(17%) wt loss in 3 months but also s/p bilateral AKAs; RD is unsure if pt has had any true weight loss. Pt appears to be weight stable since November. RD will order supplements and vitamins to encourage wound healing. Per chart, pt to likely discharge home with hospice.     Medications reviewed and include: heparin, zosyn   Labs reviewed: Na 127(L), K 5.8(H), Cl 91(L), BUN 45(H), creat 7.44(H) Hgb 8.7(L), Hct 28.1(L) cbgs- 73, 68 x 24 hrs  Nutrition-Focused physical exam completed. Findings are no fat depletion, mild muscle depletions in clavicles, and moderate generalized edema. Pt with bilateral AKAs. Pt reports he bruises easily.    Diet Order:  Diet renal with fluid restriction Fluid restriction: 1200 mL Fluid;  Room service appropriate? Yes; Fluid consistency: Thin  EDUCATION NEEDS:   Not appropriate for education at this time  Skin:  Skin Assessment: (Stage IV sacrum )  Last BM:  1/18- type 6  Height:   Ht Readings from Last 1 Encounters:  06/08/17 '5\' 10"'$  (1.778 m)    Weight:   Wt Readings from Last 1 Encounters:  06/07/17 164 lb 0.4 oz (74.4 kg)    Ideal Body Weight:  57.3 kg(adjusted for bilateral AKAs)  BMI:  Body mass index is 23.53 kg/m.  Estimated Nutritional Needs:   Kcal:  1500-1800kcal/day  Protein:  97-112g/day   Fluid:  per MD  Koleen Distance MS, RD, LDN Pager #[phone] After Hours Pager: [phone]

## 2017-06-09 LAB — GLUCOSE, CAPILLARY: Glucose-Capillary: 66 mg/dL (ref 65–99)

## 2017-06-09 MED ORDER — HALOPERIDOL 0.5 MG PO TABS: 0.5000 mg | ORAL_TABLET | ORAL | 0 refills | Status: AC | PRN

## 2017-06-09 MED ORDER — ACETAMINOPHEN 325 MG PO TABS: 650.0000 mg | ORAL_TABLET | Freq: Four times a day (QID) | ORAL | 0 refills | Status: AC | PRN

## 2017-06-09 MED ORDER — GLYCOPYRROLATE 1 MG PO TABS: 1.0000 mg | ORAL_TABLET | ORAL | 0 refills | Status: AC | PRN

## 2017-06-09 MED ORDER — ONDANSETRON HCL 4 MG PO TABS: 4.0000 mg | ORAL_TABLET | Freq: Four times a day (QID) | ORAL | 0 refills | Status: AC | PRN

## 2017-06-09 MED ORDER — OXYCODONE HCL 20 MG/ML PO CONC: 6.0000 mg | ORAL | 0 refills | Status: AC | PRN

## 2017-06-09 NOTE — Progress Notes (Addendum)
[name] to be D/C'd [facility] per MD order.  Discussed prescriptions and follow up appointments with the patient. Prescriptions given to EMS transport personal.Pt unable to comprehend. [name]. Per nephrologist ok for pt to DC with HD cath. Receiving RN aware.    Pt given nephro shake and apple juice before DC and RN made aware of CBG.  Allergies as of 06/09/2017      Reactions   Iohexol Swelling, Rash   Zanaflex [tizanidine] Other (See Comments)   Shut kidneys down Unable to walk or void   Ezetimibe-simvastatin Other (See Comments)   Elevated liver enzymes   Fluvastatin Other (See Comments)   Elevated liver enzymes   Simvastatin Other (See Comments)   Elevated liver enzymes      Medication List    STOP taking these medications   calcium acetate 667 MG capsule Commonly known as:  PHOSLO   gabapentin 100 MG capsule Commonly known as:  NEURONTIN   MIRCERA 150 MCG/0.3ML Sosy Generic drug:  Methoxy PEG-Epoetin Beta   oxyCODONE 5 MG immediate release tablet Commonly known as:  Oxy IR/ROXICODONE Replaced by:  oxyCODONE 20 MG/ML concentrated solution   Oxycodone HCl 10 MG Tabs   RENA-VITE PO     TAKE these medications   acetaminophen 325 MG tablet Commonly known as:  TYLENOL Take 2 tablets (650 mg total) by mouth every 6 (six) hours as needed for mild pain (or Fever >/= 101).   DULoxetine 30 MG capsule Commonly known as:  CYMBALTA Take 30 mg by mouth daily.   glycopyrrolate 1 MG tablet Commonly known as:  ROBINUL Take 1 tablet (1 mg total) by mouth every 4 (four) hours as needed (excessive secretions).   haloperidol 0.5 MG tablet Commonly known as:  HALDOL Take 1 tablet (0.5 mg total) by mouth every 4 (four) hours as needed for agitation (or delirium).   ondansetron 4 MG tablet Commonly known as:  ZOFRAN Take 1 tablet (4 mg total) by mouth every 6 (six) hours as needed for nausea.   oxyCODONE 20  MG/ML concentrated solution Commonly known as:  ROXICODONE INTENSOL Take 0.3 mLs (6 mg total) by mouth every 2 (two) hours as needed for moderate pain (or dyspnea). Replaces:  oxyCODONE 5 MG immediate release tablet       Vitals:   06/08/17 1130 06/09/17 0543  BP: (!) 111/52 (!) 112/47  Pulse: 74 76  Resp:  16  Temp:  98.7 F (37.1 C)  SpO2:  98%    Skin clean, dry and intact without evidence of skin break down, no evidence of skin tears noted. IV catheter discontinued intact. Site without signs and symptoms of complications. Dressing and pressure applied. Pt denies pain at this time. No complaints noted.  An After Visit Summary was printed and given to the EMS personal. Patient escorted via stretcher , and D/C via PTAR.   Ian RothmanNatalie Moira Umholtz, RN [facility] Phone [phone]

## 2017-06-09 NOTE — Progress Notes (Signed)
[facility] Anticipated discharge date: 06/09/17 Family notified: Yes, by phone Transportation by: PTAR  Report #: [phone]  CSW signing off.  Blenda Nicelylizabeth Dahir Ayer LCSW [phone]

## 2017-06-09 NOTE — Progress Notes (Signed)
Per MD Reglado pt to keep HD cath in place due no IV access.

## 2017-06-09 NOTE — Discharge Summary (Signed)
Physician Discharge Summary  [name] ZOX:[id number] DOB: 02/17/1936 DOA: 06/07/2017  PCP: Crist FatVan Eyk, Jason, MD  Admit date: 06/07/2017 Discharge date: 06/09/2017  Admitted From: SNF Disposition:  Residential Hospice  Recommendations for Outpatient Follow-up:  1. Comfort care.     Discharge Condition: guarded.  CODE STATUS: DNR Diet recommendation: Heart Healthy   Brief/Interim Summary:  Brief Narrative: :Ian RhodesWarren J Greenis a 82 y.o.malewith medical history significant ofESRD on HD, has missed treatments for last two times. He was refer by SNF for evaluation for HD. Patient per report has refused HD. Per ED records, family convince patient to come to hospital for hospice evaluation. I called wife and she was not aware of that. She is willing to speak with palliative for goals of care. She told me if he refused something don't make him do it. Regarding HD she was not sure what to do. I offer nephrology consult and palliative care consult.   Patient is having pain in his decubitus wound. His wound is foul smelling, significant pus drainage. He is confused. He doesn't know that he is in the hospital.     Assessment & Plan:   Active Problems:   End stage renal disease (HCC)   Hyperkalemia   Diabetes mellitus with complication (HCC)   Dementia   DNR (do not resuscitate) discussion   Decubitus ulcer of buttock, stage 4 (HCC)  1-ESRD; no further HD. Dr Deterding  discussed with patient and his wife. Patient with multiple comorbidity, AKA, bed bound, decubitus wound, no further HD.  Patient transition of care to comfort. appreciate D freeman help.  patient to be transfer to residential hospice.   2-Hyperkalemia; recieved dose of kayexalate 1-19. No further labs. Goal is comfort care.   3-hypoglycemia; amp D 50.   4-Stage IV decubitus wound buttocks. Infected.recieved IV antibiotics on admission. Goal is comfort care. Antibiotic discontinue. Local care and pain  management.   5-Dementia;patient is confused      Discharge Diagnoses:  Active Problems:   End stage renal disease (HCC)   Hyperkalemia   Diabetes mellitus with complication (HCC)   Dementia   DNR (do not resuscitate) discussion   Decubitus ulcer of buttock, stage 4 (HCC)    Discharge Instructions  Discharge Instructions    Diet - low sodium heart healthy   Complete by:  As directed      Allergies as of 06/09/2017      Reactions   Iohexol Swelling, Rash   Zanaflex [tizanidine] Other (See Comments)   Shut kidneys down Unable to walk or void   Ezetimibe-simvastatin Other (See Comments)   Elevated liver enzymes   Fluvastatin Other (See Comments)   Elevated liver enzymes   Simvastatin Other (See Comments)   Elevated liver enzymes      Medication List    STOP taking these medications   calcium acetate 667 MG capsule Commonly known as:  PHOSLO   gabapentin 100 MG capsule Commonly known as:  NEURONTIN   MIRCERA 150 MCG/0.3ML Sosy Generic drug:  Methoxy PEG-Epoetin Beta   oxyCODONE 5 MG immediate release tablet Commonly known as:  Oxy IR/ROXICODONE Replaced by:  oxyCODONE 20 MG/ML concentrated solution   Oxycodone HCl 10 MG Tabs   RENA-VITE PO     TAKE these medications   acetaminophen 325 MG tablet Commonly known as:  TYLENOL Take 2 tablets (650 mg total) by mouth every 6 (six) hours as needed for mild pain (or Fever >/= 101).   DULoxetine 30 MG capsule  Commonly known as:  CYMBALTA Take 30 mg by mouth daily.   glycopyrrolate 1 MG tablet Commonly known as:  ROBINUL Take 1 tablet (1 mg total) by mouth every 4 (four) hours as needed (excessive secretions).   haloperidol 0.5 MG tablet Commonly known as:  HALDOL Take 1 tablet (0.5 mg total) by mouth every 4 (four) hours as needed for agitation (or delirium).   ondansetron 4 MG tablet Commonly known as:  ZOFRAN Take 1 tablet (4 mg total) by mouth every 6 (six) hours as needed for nausea.    oxyCODONE 20 MG/ML concentrated solution Commonly known as:  ROXICODONE INTENSOL Take 0.3 mLs (6 mg total) by mouth every 2 (two) hours as needed for moderate pain (or dyspnea). Replaces:  oxyCODONE 5 MG immediate release tablet       Allergies  Allergen Reactions  . Iohexol Swelling and Rash  . Zanaflex [Tizanidine] Other (See Comments)    Shut kidneys down Unable to walk or void  . Ezetimibe-Simvastatin Other (See Comments)    Elevated liver enzymes  . Fluvastatin Other (See Comments)    Elevated liver enzymes  . Simvastatin Other (See Comments)    Elevated liver enzymes    Consultations:  Nephrology  Palliative    Procedures/Studies: Dg Chest Port 1 View  Result Date: 06/07/2017 CLINICAL DATA:  Short of breath EXAM: PORTABLE CHEST 1 VIEW COMPARISON:  04/01/2017 FINDINGS: Borderline cardiomegaly. Low volumes. Bibasilar atelectasis. Left jugular tunneled dialysis catheter placed with its tip at the cavoatrial junction. No pneumothorax IMPRESSION: Bibasilar atelectasis Left jugular dialysis catheter with its tip at the cavoatrial junction. No pneumothorax. Electronically Signed   By: Jolaine Click M.D.   On: 06/07/2017 13:29     Subjective: He is alert, trying to eat breakfast, soft speech.   Discharge Exam: Vitals:   06/08/17 1130 06/09/17 0543  BP: (!) 111/52 (!) 112/47  Pulse: 74 76  Resp:  16  Temp:  98.7 F (37.1 C)  SpO2:  98%   Vitals:   06/08/17 0627 06/08/17 1100 06/08/17 1130 06/09/17 0543  BP: (!) 114/57  (!) 111/52 (!) 112/47  Pulse: 100  74 76  Resp: 16   16  Temp: 98.4 F (36.9 C) 98.5 F (36.9 C)  98.7 F (37.1 C)  TempSrc: Oral Oral  Oral  SpO2: 99%   98%  Weight:      Height:        General: Pt is alert, Cardiovascular: RRR, S1/S2 +, no rubs, no gallops Respiratory: CTA bilaterally, no wheezing, no rhonchi Abdominal: Soft, NT, ND, bowel sounds + Extremities: no edema, no cyanosis Decubitus wound    The results of significant  diagnostics from this hospitalization (including imaging, microbiology, ancillary and laboratory) are listed below for reference.     Microbiology: Recent Results (from the past 240 hour(s))  MRSA PCR Screening     Status: None   Collection Time: 06/08/17 12:06 AM  Result Value Ref Range Status   MRSA by PCR NEGATIVE NEGATIVE Final    Comment:        The GeneXpert MRSA Assay (FDA approved for NASAL specimens only), is one component of a comprehensive MRSA colonization surveillance program. It is not intended to diagnose MRSA infection nor to guide or monitor treatment for MRSA infections.      Labs: BNP (last 3 results) Recent Labs    12/23/16 2004  BNP 371.8*   Basic Metabolic Panel: Recent Labs  Lab 06/07/17 1327 06/08/17 0239 06/08/17 1610  NA 127* 127* 126*  K 6.0* 5.8* 6.4*  CL 93* 91* 92*  CO2 21* 24 22  GLUCOSE 73 68 46*  BUN 43* 45* 47*  CREATININE 6.96* 7.44* 7.60*  CALCIUM 9.4 9.0 8.5*   Liver Function Tests: Recent Labs  Lab 06/07/17 1327  AST 23  ALT 15*  ALKPHOS 112  BILITOT 0.7  PROT 5.5*  ALBUMIN 2.1*   No results for input(s): LIPASE, AMYLASE in the last 168 hours. No results for input(s): AMMONIA in the last 168 hours. CBC: Recent Labs  Lab 06/07/17 1327 06/08/17 0239  WBC 8.4 7.6  NEUTROABS 6.3  --   HGB 10.5* 8.7*  HCT 34.6* 28.1*  MCV 88.0 87.5  PLT 274 256   Cardiac Enzymes: No results for input(s): CKTOTAL, CKMB, CKMBINDEX, TROPONINI in the last 168 hours. BNP: Invalid input(s): POCBNP CBG: Recent Labs  Lab 06/08/17 1204  GLUCAP 70   D-Dimer No results for input(s): DDIMER in the last 72 hours. Hgb A1c No results for input(s): HGBA1C in the last 72 hours. Lipid Profile No results for input(s): CHOL, HDL, LDLCALC, TRIG, CHOLHDL, LDLDIRECT in the last 72 hours. Thyroid function studies No results for input(s): TSH, T4TOTAL, T3FREE, THYROIDAB in the last 72 hours.  Invalid input(s): FREET3 Anemia work up No  results for input(s): VITAMINB12, FOLATE, FERRITIN, TIBC, IRON, RETICCTPCT in the last 72 hours. Urinalysis    Component Value Date/Time   COLORURINE YELLOW 12/26/2016 0024   APPEARANCEUR CLOUDY (A) 12/26/2016 0024   LABSPEC 1.016 12/26/2016 0024   PHURINE 8.0 12/26/2016 0024   GLUCOSEU NEGATIVE 12/26/2016 0024   HGBUR SMALL (A) 12/26/2016 0024   BILIRUBINUR NEGATIVE 12/26/2016 0024   KETONESUR NEGATIVE 12/26/2016 0024   PROTEINUR 100 (A) 12/26/2016 0024   UROBILINOGEN 0.2 03/08/2011 0253   NITRITE NEGATIVE 12/26/2016 0024   LEUKOCYTESUR LARGE (A) 12/26/2016 0024   Sepsis Labs Invalid input(s): PROCALCITONIN,  WBC,  LACTICIDVEN Microbiology Recent Results (from the past 240 hour(s))  MRSA PCR Screening     Status: None   Collection Time: 06/08/17 12:06 AM  Result Value Ref Range Status   MRSA by PCR NEGATIVE NEGATIVE Final    Comment:        The GeneXpert MRSA Assay (FDA approved for NASAL specimens only), is one component of a comprehensive MRSA colonization surveillance program. It is not intended to diagnose MRSA infection nor to guide or monitor treatment for MRSA infections.      Time coordinating discharge: Over 30 minutes  SIGNED:   Alba Cory, MD  [facility] 06/09/2017, 10:23 AM Pager [phone]  If 7PM-7AM, please contact night-coverage [email or web address] Password [email or web address]

## 2017-06-09 NOTE — Progress Notes (Signed)
CSW following for discharge plan. Patient is a recent readmission, with full assessment completed on 02/14/17. CSW received update from PMT MD that [facility]. CSW contacted Admissions and sent referral.  CSW will continue to follow.  Blenda NicelyElizabeth Nicolet Griffy, [place] Clinical Social Worker [phone]

## 2017-06-19 ENCOUNTER — Encounter: Payer: Medicare Other | Admitting: Vascular Surgery

## 2017-06-21 DEATH — deceased

## 2019-05-19 IMAGING — DX DG CHEST 1V PORT
1 series · 1 of 1 positions shown · non-contrast
Comparison: 02/09/2017

CLINICAL DATA: Right PICC placement

EXAM:
PORTABLE CHEST 1 VIEW

[chest]
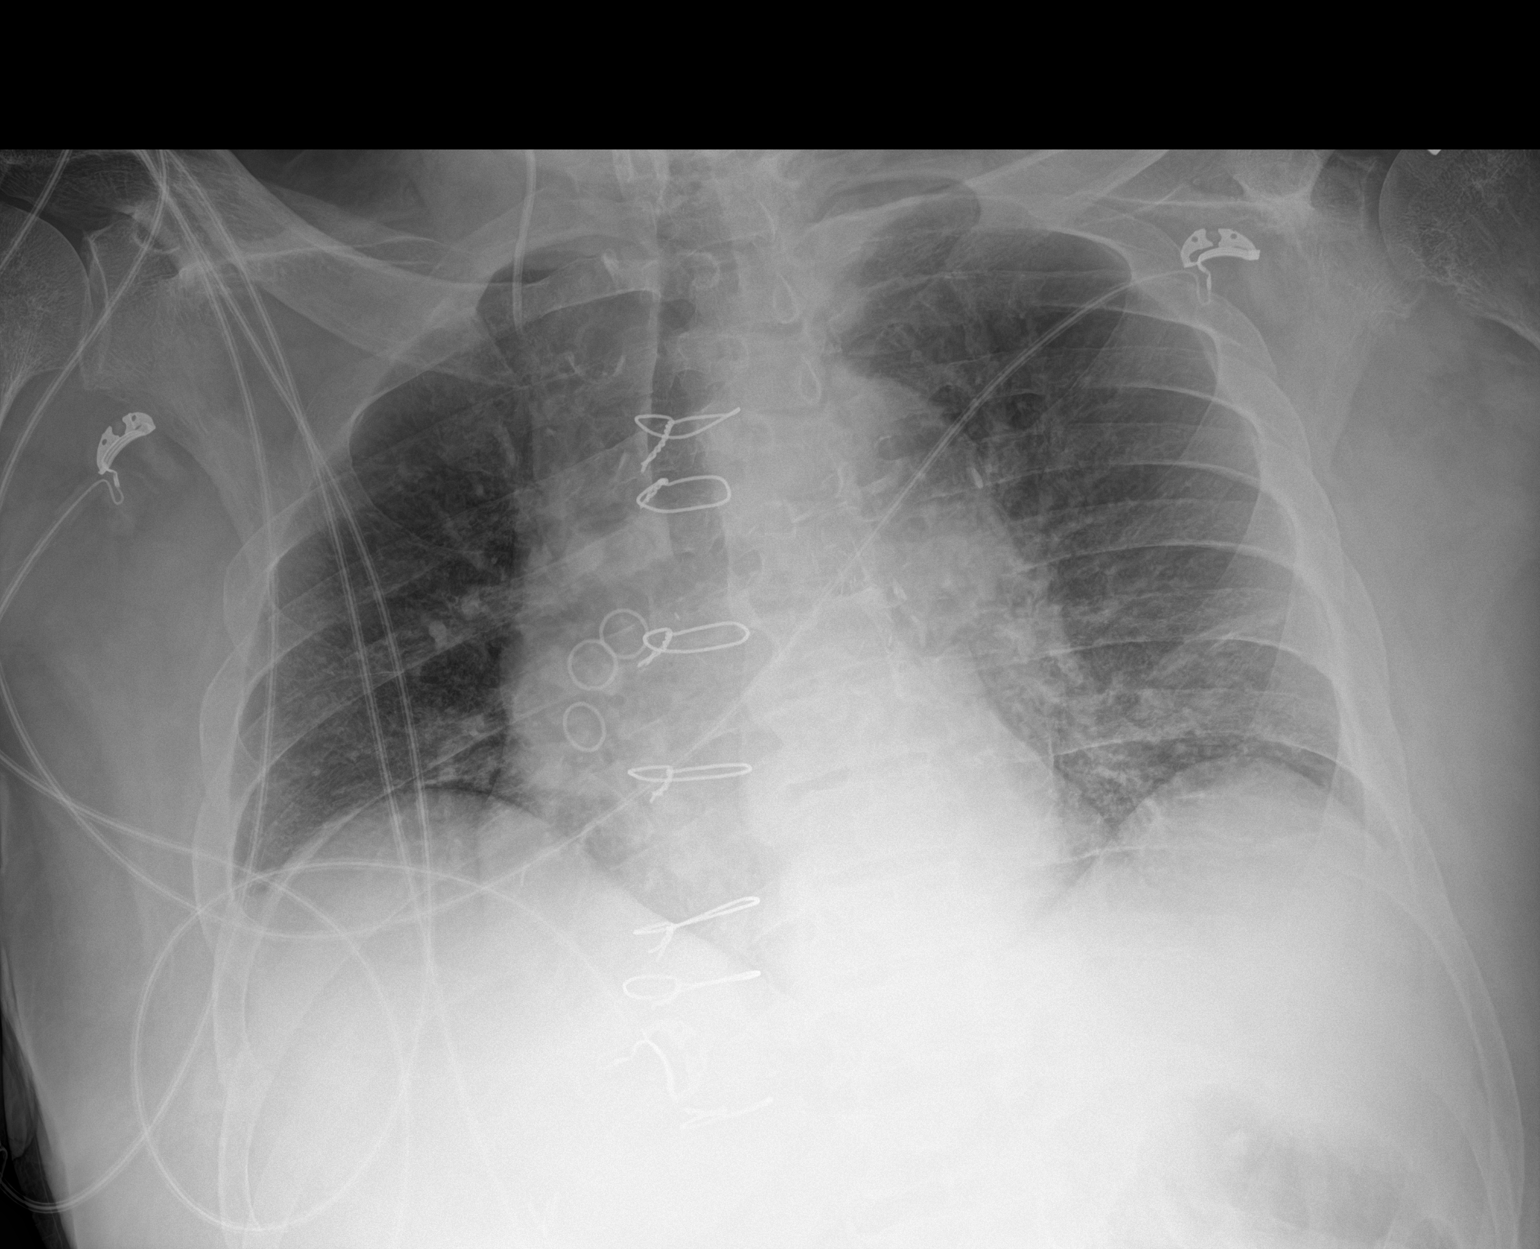

[1 of 1 positions shown; findings below may reference images not displayed]

FINDINGS: Right-sided central venous catheter tip has been straightened, it
probably projects over the distal jugular confluence allowing for
patient rotation. Post sternotomy changes. Linear atelectasis at the
left lung base. Stable cardiomediastinal silhouette.
IMPRESSION: Right-sided central venous catheter tip has been straightened, tip
suspected to project over distal jugular venous confluence allowing
for markedly rotated patient. No change in cardiomegaly and
bibasilar atelectasis

## 2019-05-27 IMAGING — CT CT HEAD W/O CM
3 series · 15 of 47 positions shown, 18 images · non-contrast
Comparison: None.

CLINICAL DATA: Altered mental status

EXAM:
CT HEAD WITHOUT CONTRAST
TECHNIQUE: Contiguous axial images were obtained from the base of the skull
through the vertex without intravenous contrast.

[Series 3: head 5.0 h30s · axial · 0.45mm/px · z∈[-142,+8]mm · 9 of 36 slices shown, 12 images]
[im 3/36  brain]
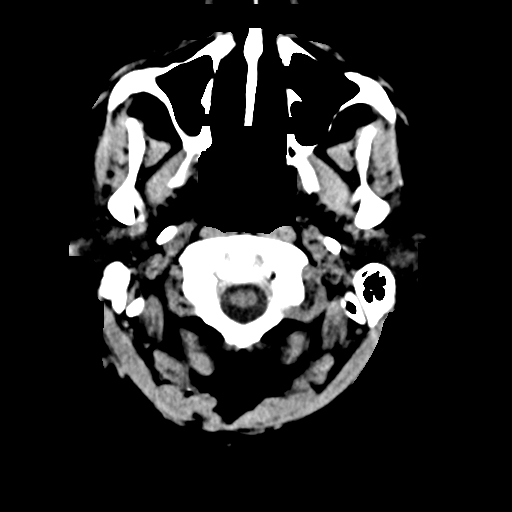
[im 3/36  bone]
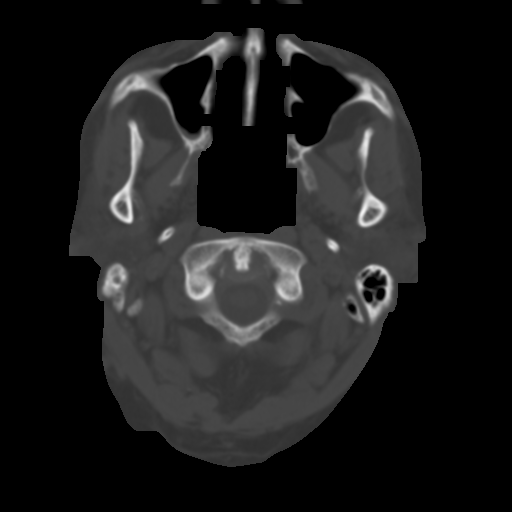
[im 7/36  brain]
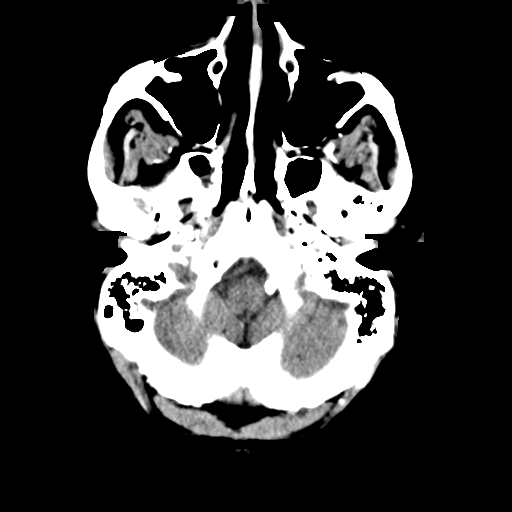
[im 10/36  brain]
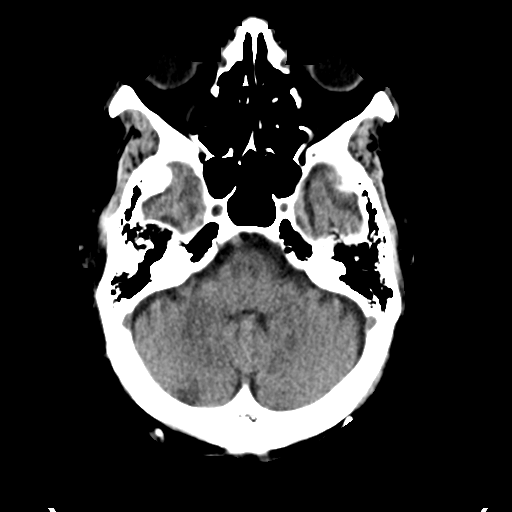
[im 14/36  brain]
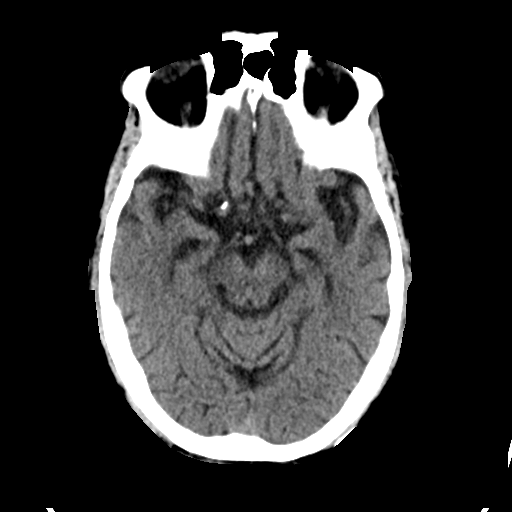
[im 19/36  brain]
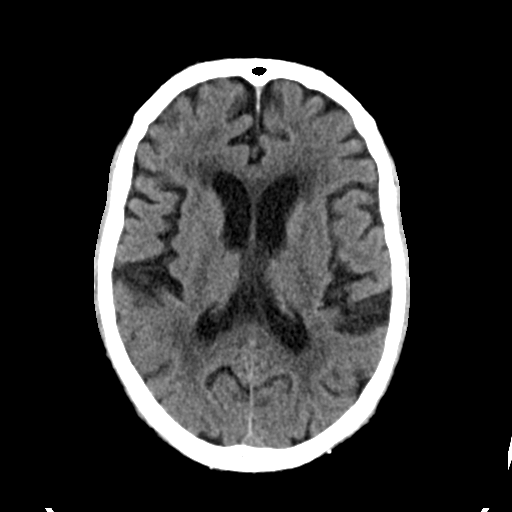
[im 19/36  bone]
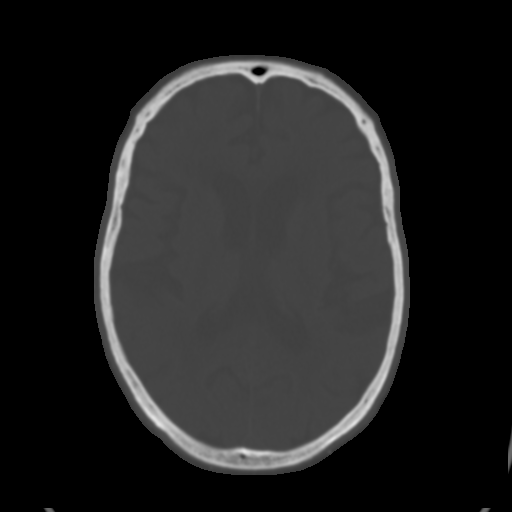
[im 22/36  brain]
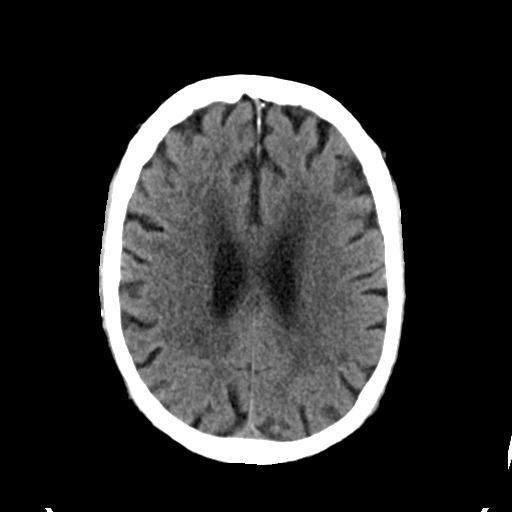
[im 26/36  brain]
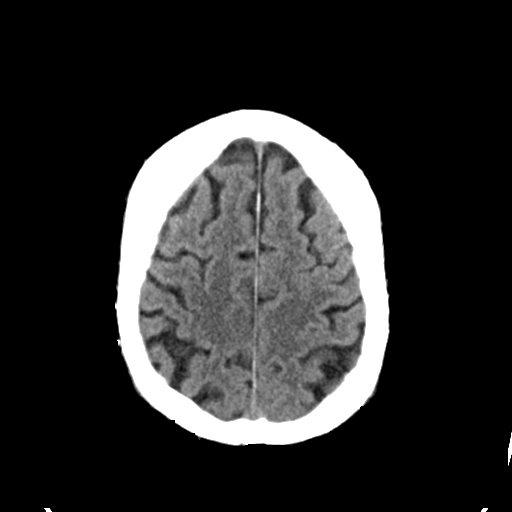
[im 29/36  brain]
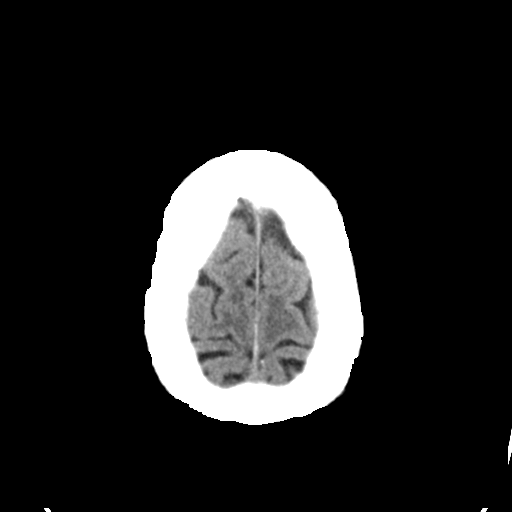
[im 33/36  brain]
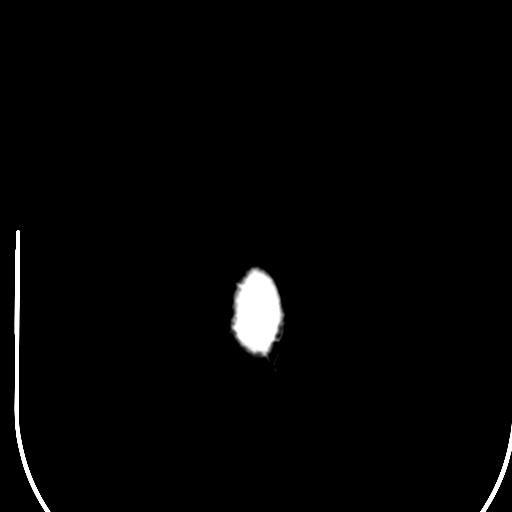
[im 33/36  bone]
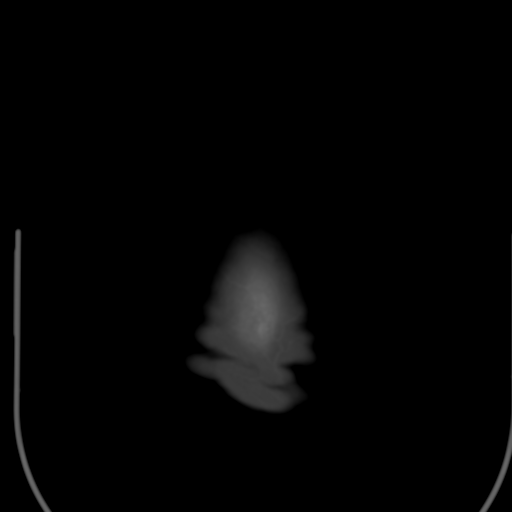

[Series 5: head 3.0 mpr cor · coronal · 0.35mm/px · 3 of 75 slices shown]
[im 25/75  brain]
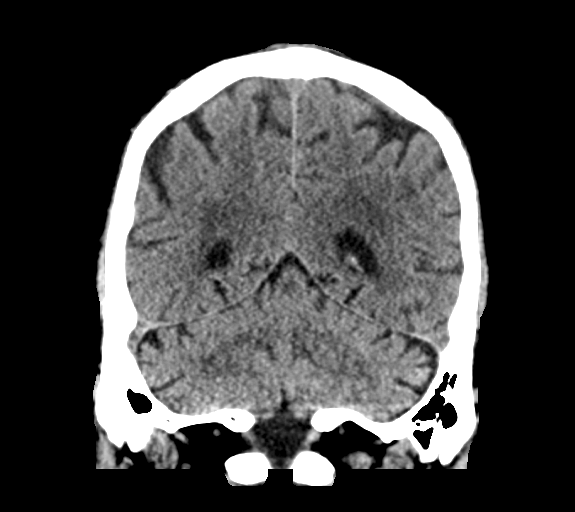
[im 33/75  brain]
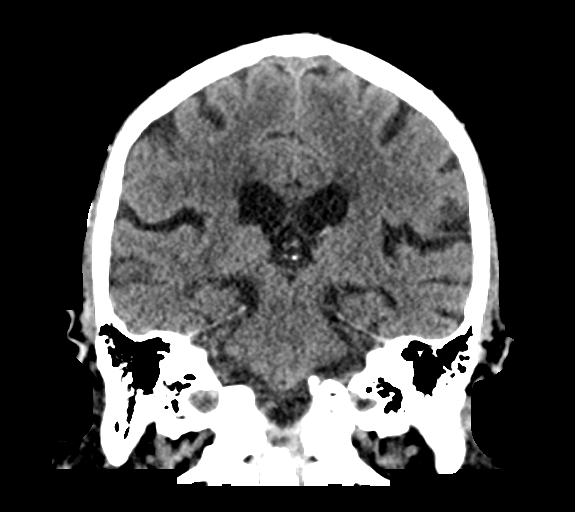
[im 42/75  brain]
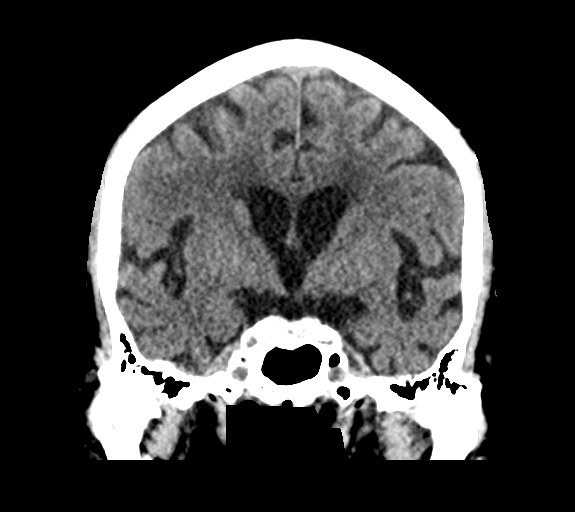

[Series 6: head 3.0 mpr sag · sagittal · 0.35mm/px · 3 of 63 slices shown]
[im 21/63  brain]
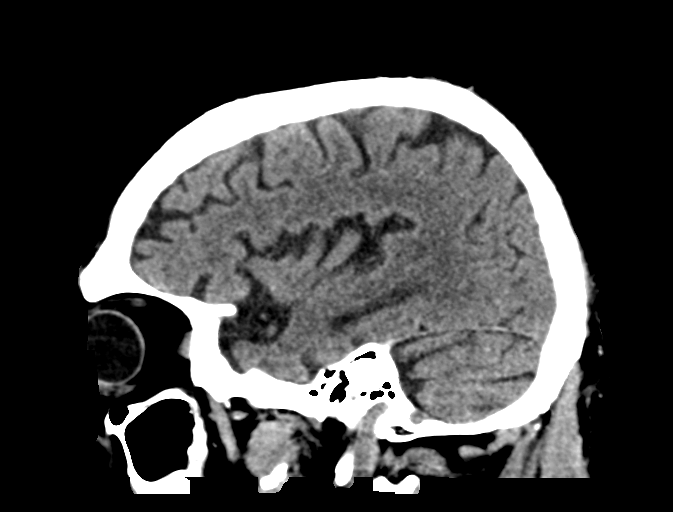
[im 32/63  brain]
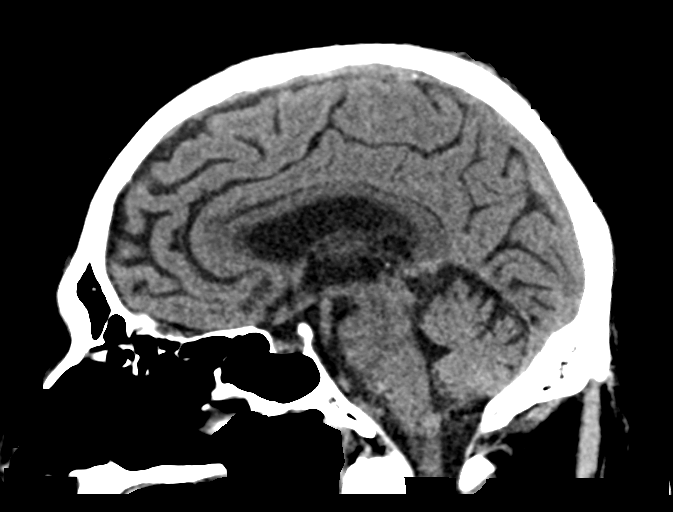
[im 42/63  brain]
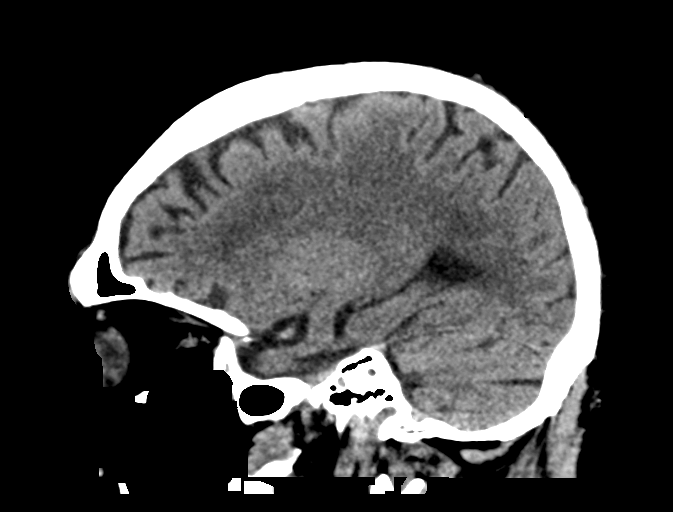

[15 of 47 positions shown; findings below may reference images not displayed]

FINDINGS: Brain: No mass lesion, intraparenchymal hemorrhage or extra-axial
collection. No evidence of acute cortical infarct. There is
periventricular hypoattenuation compatible with chronic
microvascular disease.

Vascular: No hyperdense vessel or unexpected calcification.

Skull: Normal visualized skull base, calvarium and extracranial soft
tissues.

Sinuses/Orbits: No sinus fluid levels or advanced mucosal
thickening. No mastoid effusion. Normal orbits.
IMPRESSION: Chronic ischemic microangiopathy without acute abnormality.

## 2019-08-12 IMAGING — US IR THROMBECTOMY AV FISTULA W/THROMBOLYSIS/PTA/STENT INC/SHUNT/IM
1 series · 2 of 2 positions shown · non-contrast
Comparison: Shuntogram with intervention - 03/14/2017

INDICATION: Clotted graft

EXAM:
1. FISTULALYSIS
2. ANGIOPLASTY OF VENOUS LIMB AND VENOUS ANASTOMOSIS
3. ULTRASOUND GUIDANCE FOR VENOUS ACCESS
TECHNIQUE: Informed written consent was obtained from the the patient's wife
after a discussion of the risks, benefits and alternatives to
treatment. Questions regarding the procedure were encouraged and
answered. A timeout was performed prior to the initiation of the
procedure.

[Series 1: ir thrombectomy av fistula w/thrombolysis/pta/sten · 2 of 2 slices shown]
[im 1/2]
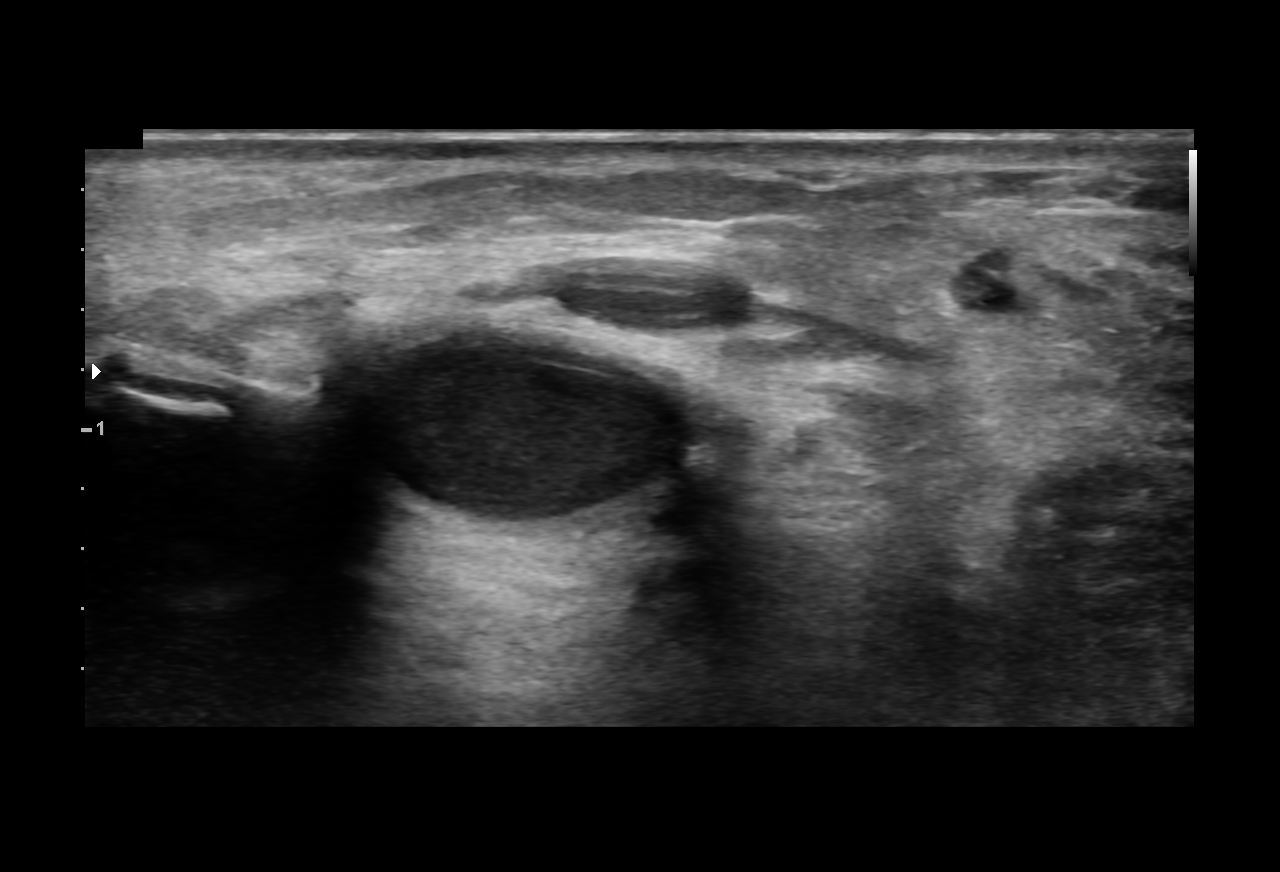
[im 2/2]
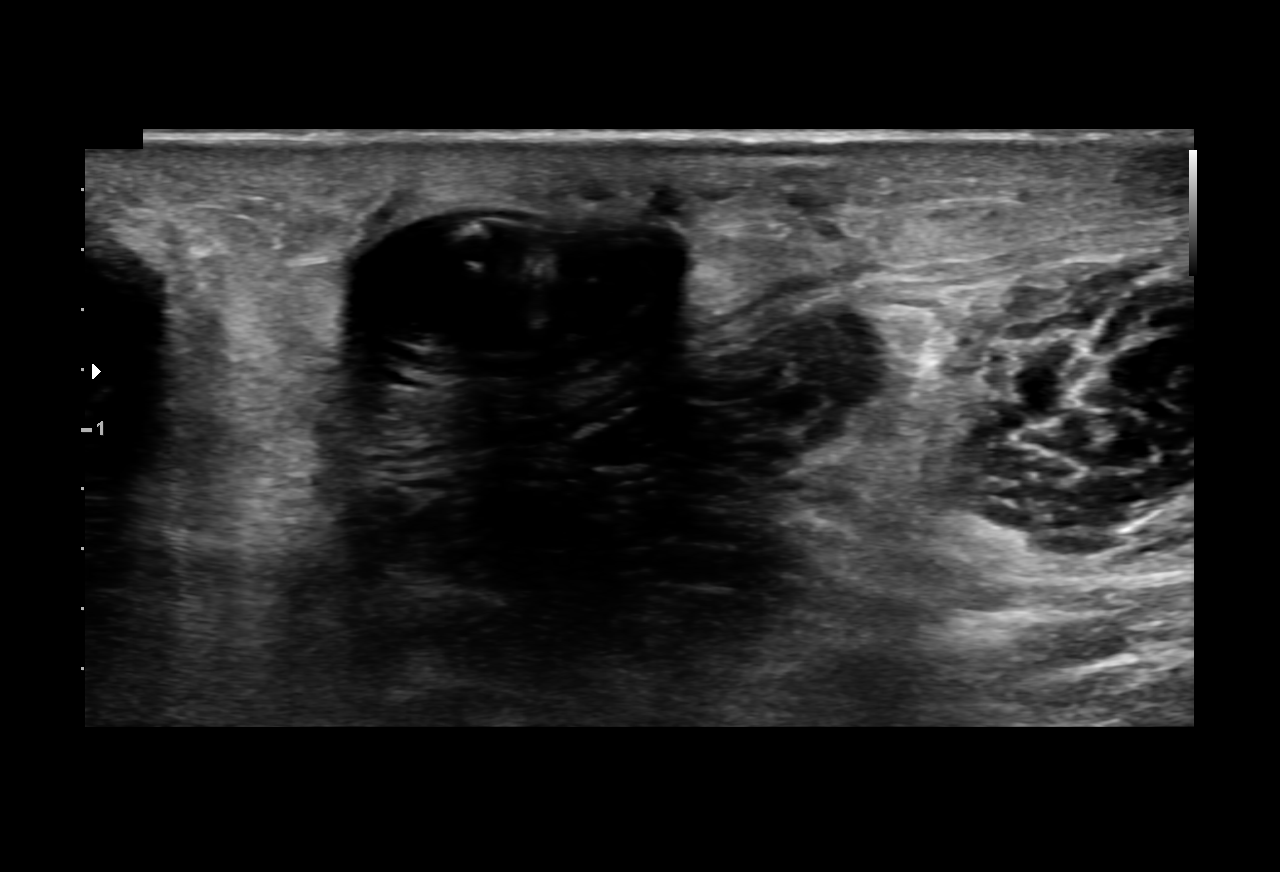

[2 of 2 positions shown; findings below may reference images not displayed]

MEDICATIONS:
Heparin 4777 units IV; TPA 4 mg into graft.

CONTRAST:  50 cc [id number]

ANESTHESIA/SEDATION:
Moderate (conscious) sedation was employed during this procedure. A
total of Versed 4 mg and Fentanyl 75 mcg was administered
intravenously.

Moderate Sedation Time: 80 minutes. The patient's level of
consciousness and vital signs were monitored continuously by
radiology nursing throughout the procedure under my direct
supervision.

FLUOROSCOPY TIME:  10 minutes 48 seconds (37 mGy)

COMPLICATIONS:
None immediate.
On physical examination, the existing left forearm arm dialysis
graft was negative for palpable pulse or thrill. The skin overlying
the graft was prepped and draped in the usual sterile fashion, and a
sterile drape was applied covering the operative field. Maximum
barrier sterile technique with sterile gowns and gloves were used
for the procedure.

Under ultrasound guidance, the dialysis graft was accessed directed
towards the venous anastomosis with a micropuncture kit after the
overlying soft tissues were anesthetized with 1% lidocaine. An
ultrasound image was saved for documentation purposes. The
micropuncture sheath was exchange for a 7-French vascular sheath
over a guidewire. Over a Benson wire, a Kumpe catheter was advanced
centrally and a central venogram was performed. Pull back venogram
was performed with the Kumpe catheter. Heparin was administered
systemically and TPA was administered via the Kumpe catheter
throughout near the entirety of the venous limb.

The venous anastomosis and majority of the venous limb was
angioplastied with a 7 mm x 4 cm Conquest balloon. An additional
access was obtained directed towards the arterial anastomoses with a
micropuncture kit after the overlying soft tissues anesthetized with
1% lidocaine. This allowed for placement of a 6-French vascular
sheath. The graft was thrombectomized with several rounds of
push-pull mechanical thrombectomy with an occlusion balloon. Flow
was restored to the graft as evidenced by restored pulsatility of
the graft and brisk blood return from the side arm of the vascular
sheath. Shuntograms were performed.

Moderate length residual tandem areas of at least 50% luminal
narrowing involving the mid humeral aspect of the draining brachial
vein was then subsequent balloon angioplastied at multiple stations
initially with a 7 mm diameter and ultimately with an 8 mm diameter
Conquest balloon.

Repeat venogram images demonstrate a small area of contrast
extravasation involving the mid aspect of the draining brachial
vein. As such, prolonged angioplasty was performed at the location
of the extravasation with 8 mm balloon.

Repeat venogram demonstrates persistent irregularity at this
location though without definitive persistent extravasation.

Next, a 10 mm x 6 cm Cordis un covered stent was deployed across the
location of persistent vessel irregularity. The stent was
angioplastied in multiple stations with an 8 mm angioplasty balloon.

Post stent deployment venogram demonstrates persistent moderate
length irregularity involving the more peripheral aspect of the
draining humeral vein. As such, an additional 10 mm x 4 cm Cordis un
covered stent was deployed across the peripheral aspect of the
initial stent. The stent was angioplastied in multiple stations with
an 8 mm angioplasty balloon.

Completion venogram images were obtained. At this point, the
procedure was terminated. All wires, catheters and sheaths were
removed from the patient. Hemostasis was achieved at both access
sites with deployment of a swizzle sutures which will be removed at
the patient's next dialysis session. Dressings were placed. The
patient tolerated the procedure without immediate postprocedural
complication.
FINDINGS: The existing left forearm AV graft is thrombosed. The venous
anastomosis and near the entirety of the venous limb was
angioplastied to 7 mm diameter. The graft was successfully
thrombectomized using mechanical and pharmacologic means as above.

Moderate to long length irregular narrowing involving the mid and
distal aspects aspects of the humeral segment of the draining
brachial vein was treated with prolonged balloon angioplasty to 8 mm
diameter.

Residual areas of vessel irregularity involving the mid and
peripheral aspect of the draining humeral vein was ultimately
treated with deployment of overlapping 10 mm un covered stents. The
stent was subsequently balloon angioplastied 8 mm diameter.

Completion shuntogram demonstrates the venous limb and anastomosis
are widely patent.

There is mild irregularity involving the peripheral most aspect of
the draining brachial vein.

The overlapping un covered stents involving in the mid and
peripheral aspect of the brachial vein are widely patent.

The central venous system, arterial limb and arterial anastomosis
are widely patent.
IMPRESSION: 1. Technically successful left forearm AV graft thrombolysis.
2. Successful angioplasty of the venous anastomosis and majority of
the venous limb to 7 mm diameter. Completion shuntogram demonstrates
the venous limb and anastomosis are widely patent.
3. Technically successful treatment of moderate to long length
irregular narrowings involving the mid and distal aspects of the
humeral segment of the draining brachial vein ultimately with
overlapping 10 mm un covered stent deployment.
4. The arterial anastomosis and arterial limb are widely patent.
5. The central venous system is widely patent.

ACCESS:
This graft would be amenable to future percutaneous intervention as
clinically indicated.
